# Patient Record
Sex: Male | Born: 1940
Health system: Southern US, Community
[De-identification: ages and names within clinical notes are randomized; demographics above are authoritative.]

## PROBLEM LIST (undated history)

## (undated) DIAGNOSIS — D649 Anemia, unspecified: Secondary | ICD-10-CM

## (undated) DIAGNOSIS — R911 Solitary pulmonary nodule: Secondary | ICD-10-CM

## (undated) DIAGNOSIS — Z720 Tobacco use: Secondary | ICD-10-CM

## (undated) DIAGNOSIS — N184 Chronic kidney disease, stage 4 (severe): Secondary | ICD-10-CM

## (undated) DIAGNOSIS — R1310 Dysphagia, unspecified: Secondary | ICD-10-CM

## (undated) DIAGNOSIS — J9610 Chronic respiratory failure, unspecified whether with hypoxia or hypercapnia: Secondary | ICD-10-CM

## (undated) DIAGNOSIS — I2510 Atherosclerotic heart disease of native coronary artery without angina pectoris: Secondary | ICD-10-CM

## (undated) DIAGNOSIS — F250 Schizoaffective disorder, bipolar type: Secondary | ICD-10-CM

## (undated) DIAGNOSIS — E119 Type 2 diabetes mellitus without complications: Secondary | ICD-10-CM

## (undated) DIAGNOSIS — I10 Essential (primary) hypertension: Secondary | ICD-10-CM

## (undated) DIAGNOSIS — K219 Gastro-esophageal reflux disease without esophagitis: Secondary | ICD-10-CM

## (undated) DIAGNOSIS — J8410 Pulmonary fibrosis, unspecified: Secondary | ICD-10-CM

## (undated) DIAGNOSIS — I5032 Chronic diastolic (congestive) heart failure: Secondary | ICD-10-CM

## (undated) DIAGNOSIS — F259 Schizoaffective disorder, unspecified: Secondary | ICD-10-CM

## (undated) HISTORY — PX: CORONARY ARTERY BYPASS GRAFT: SHX141

---

## 2018-05-15 ENCOUNTER — Observation Stay (HOSPITAL_COMMUNITY): Payer: Medicare Other

## 2018-05-15 ENCOUNTER — Other Ambulatory Visit: Payer: Self-pay

## 2018-05-15 ENCOUNTER — Emergency Department (HOSPITAL_COMMUNITY): Payer: Medicare Other

## 2018-05-15 ENCOUNTER — Encounter (HOSPITAL_COMMUNITY): Payer: Self-pay | Admitting: Emergency Medicine

## 2018-05-15 ENCOUNTER — Inpatient Hospital Stay (HOSPITAL_COMMUNITY)
Admission: EM | Admit: 2018-05-15 | Discharge: 2018-05-18 | DRG: 872 | Disposition: A | Discharge status: Home / Self Care | Payer: Medicare Other | Attending: Internal Medicine | Admitting: Internal Medicine

## 2018-05-15 DIAGNOSIS — N3000 Acute cystitis without hematuria: Secondary | ICD-10-CM

## 2018-05-15 DIAGNOSIS — A419 Sepsis, unspecified organism: Secondary | ICD-10-CM | POA: Diagnosis not present

## 2018-05-15 DIAGNOSIS — N320 Bladder-neck obstruction: Secondary | ICD-10-CM | POA: Diagnosis present

## 2018-05-15 DIAGNOSIS — A4151 Sepsis due to Escherichia coli [E. coli]: Principal | ICD-10-CM | POA: Diagnosis present

## 2018-05-15 DIAGNOSIS — R339 Retention of urine, unspecified: Secondary | ICD-10-CM | POA: Diagnosis present

## 2018-05-15 DIAGNOSIS — F250 Schizoaffective disorder, bipolar type: Secondary | ICD-10-CM

## 2018-05-15 DIAGNOSIS — Z951 Presence of aortocoronary bypass graft: Secondary | ICD-10-CM

## 2018-05-15 DIAGNOSIS — I10 Essential (primary) hypertension: Secondary | ICD-10-CM | POA: Diagnosis present

## 2018-05-15 DIAGNOSIS — F259 Schizoaffective disorder, unspecified: Secondary | ICD-10-CM | POA: Diagnosis present

## 2018-05-15 DIAGNOSIS — D649 Anemia, unspecified: Secondary | ICD-10-CM | POA: Diagnosis present

## 2018-05-15 DIAGNOSIS — I2510 Atherosclerotic heart disease of native coronary artery without angina pectoris: Secondary | ICD-10-CM | POA: Insufficient documentation

## 2018-05-15 DIAGNOSIS — E876 Hypokalemia: Secondary | ICD-10-CM | POA: Diagnosis present

## 2018-05-15 DIAGNOSIS — N179 Acute kidney failure, unspecified: Secondary | ICD-10-CM | POA: Diagnosis not present

## 2018-05-15 DIAGNOSIS — R338 Other retention of urine: Secondary | ICD-10-CM

## 2018-05-15 DIAGNOSIS — J8410 Pulmonary fibrosis, unspecified: Secondary | ICD-10-CM | POA: Diagnosis present

## 2018-05-15 DIAGNOSIS — K219 Gastro-esophageal reflux disease without esophagitis: Secondary | ICD-10-CM | POA: Diagnosis present

## 2018-05-15 DIAGNOSIS — E785 Hyperlipidemia, unspecified: Secondary | ICD-10-CM | POA: Diagnosis present

## 2018-05-15 DIAGNOSIS — N390 Urinary tract infection, site not specified: Secondary | ICD-10-CM | POA: Diagnosis present

## 2018-05-15 DIAGNOSIS — Z8249 Family history of ischemic heart disease and other diseases of the circulatory system: Secondary | ICD-10-CM

## 2018-05-15 DIAGNOSIS — Z23 Encounter for immunization: Secondary | ICD-10-CM

## 2018-05-15 DIAGNOSIS — Z7982 Long term (current) use of aspirin: Secondary | ICD-10-CM

## 2018-05-15 DIAGNOSIS — N281 Cyst of kidney, acquired: Secondary | ICD-10-CM | POA: Diagnosis present

## 2018-05-15 DIAGNOSIS — Z87891 Personal history of nicotine dependence: Secondary | ICD-10-CM

## 2018-05-15 DIAGNOSIS — Z833 Family history of diabetes mellitus: Secondary | ICD-10-CM

## 2018-05-15 HISTORY — DX: Schizoaffective disorder, bipolar type: F25.0

## 2018-05-15 HISTORY — DX: Anemia, unspecified: D64.9

## 2018-05-15 HISTORY — DX: Atherosclerotic heart disease of native coronary artery without angina pectoris: I25.10

## 2018-05-15 HISTORY — DX: Gastro-esophageal reflux disease without esophagitis: K21.9

## 2018-05-15 HISTORY — DX: Schizoaffective disorder, unspecified: F25.9

## 2018-05-15 HISTORY — DX: Essential (primary) hypertension: I10

## 2018-05-15 HISTORY — DX: Pulmonary fibrosis, unspecified: J84.10

## 2018-05-15 LAB — URINALYSIS, ROUTINE W REFLEX MICROSCOPIC
BILIRUBIN URINE: NEGATIVE
Glucose, UA: NEGATIVE mg/dL
Ketones, ur: NEGATIVE mg/dL
Nitrite: NEGATIVE
PROTEIN: 100 mg/dL — AB
Specific Gravity, Urine: 1.011 (ref 1.005–1.030)
pH: 5 (ref 5.0–8.0)

## 2018-05-15 LAB — CBC WITH DIFFERENTIAL/PLATELET
Abs Immature Granulocytes: 0.04 10*3/uL (ref 0.00–0.07)
BASOS PCT: 0 %
Basophils Absolute: 0 10*3/uL (ref 0.0–0.1)
EOS ABS: 0 10*3/uL (ref 0.0–0.5)
EOS PCT: 0 %
HCT: 34.3 % — ABNORMAL LOW (ref 39.0–52.0)
Hemoglobin: 10.3 g/dL — ABNORMAL LOW (ref 13.0–17.0)
Immature Granulocytes: 0 %
Lymphocytes Relative: 3 %
Lymphs Abs: 0.3 10*3/uL — ABNORMAL LOW (ref 0.7–4.0)
MCH: 27.5 pg (ref 26.0–34.0)
MCHC: 30 g/dL (ref 30.0–36.0)
MCV: 91.5 fL (ref 80.0–100.0)
MONO ABS: 0.1 10*3/uL (ref 0.1–1.0)
Monocytes Relative: 1 %
NEUTROS ABS: 9.7 10*3/uL — AB (ref 1.7–7.7)
Neutrophils Relative %: 96 %
PLATELETS: 174 10*3/uL (ref 150–400)
RBC: 3.75 MIL/uL — ABNORMAL LOW (ref 4.22–5.81)
RDW: 15 % (ref 11.5–15.5)
WBC: 10.1 10*3/uL (ref 4.0–10.5)
nRBC: 0 % (ref 0.0–0.2)

## 2018-05-15 LAB — I-STAT CG4 LACTIC ACID, ED
Lactic Acid, Venous: 3.35 mmol/L (ref 0.5–1.9)
Lactic Acid, Venous: 3.65 mmol/L (ref 0.5–1.9)

## 2018-05-15 LAB — COMPREHENSIVE METABOLIC PANEL
ALBUMIN: 3.6 g/dL (ref 3.5–5.0)
ALT: 19 U/L (ref 0–44)
AST: 27 U/L (ref 15–41)
Alkaline Phosphatase: 95 U/L (ref 38–126)
Anion gap: 10 (ref 5–15)
BUN: 37 mg/dL — AB (ref 8–23)
CHLORIDE: 110 mmol/L (ref 98–111)
CO2: 21 mmol/L — ABNORMAL LOW (ref 22–32)
Calcium: 7.5 mg/dL — ABNORMAL LOW (ref 8.9–10.3)
Creatinine, Ser: 2.63 mg/dL — ABNORMAL HIGH (ref 0.61–1.24)
GFR calc Af Amer: 26 mL/min — ABNORMAL LOW (ref >60)
GFR calc non Af Amer: 22 mL/min — ABNORMAL LOW (ref >60)
Glucose, Bld: 172 mg/dL — ABNORMAL HIGH (ref 70–99)
POTASSIUM: 3.3 mmol/L — AB (ref 3.5–5.1)
SODIUM: 141 mmol/L (ref 135–145)
Total Bilirubin: 0.9 mg/dL (ref 0.3–1.2)
Total Protein: 6.9 g/dL (ref 6.5–8.1)

## 2018-05-15 LAB — LACTIC ACID, PLASMA: Lactic Acid, Venous: 2 mmol/L (ref 0.5–1.9)

## 2018-05-15 LAB — CBG MONITORING, ED: GLUCOSE-CAPILLARY: 150 mg/dL — AB (ref 70–99)

## 2018-05-15 LAB — PROCALCITONIN: Procalcitonin: 70.91 ng/mL

## 2018-05-15 MED ORDER — ONDANSETRON HCL 4 MG/2ML IJ SOLN: 4.0000 mg | Freq: Four times a day (QID) | INTRAMUSCULAR | Status: DC | PRN

## 2018-05-15 MED ORDER — ENOXAPARIN SODIUM 30 MG/0.3ML ~~LOC~~ SOLN
30.0000 mg | SUBCUTANEOUS | Status: DC
  Administered 2018-05-16 – 2018-05-17 (×3): 30 mg via SUBCUTANEOUS
  Filled 2018-05-15 (×3): qty 0.3

## 2018-05-15 MED ORDER — ONDANSETRON HCL 4 MG/2ML IJ SOLN: 4.0000 mg | Freq: Once | INTRAMUSCULAR | Status: DC

## 2018-05-15 MED ORDER — POTASSIUM CHLORIDE 20 MEQ/15ML (10%) PO SOLN
40.0000 meq | Freq: Once | ORAL | Status: DC
  Filled 2018-05-15: qty 30

## 2018-05-15 MED ORDER — ACETAMINOPHEN 325 MG PO TABS: 650.0000 mg | ORAL_TABLET | Freq: Four times a day (QID) | ORAL | Status: DC | PRN

## 2018-05-15 MED ORDER — INFLUENZA VAC SPLIT HIGH-DOSE 0.5 ML IM SUSY
0.5000 mL | PREFILLED_SYRINGE | INTRAMUSCULAR | Status: AC
  Administered 2018-05-16: 0.5 mL via INTRAMUSCULAR
  Filled 2018-05-15: qty 0.5

## 2018-05-15 MED ORDER — SODIUM CHLORIDE 0.9 % IV BOLUS
500.0000 mL | Freq: Once | INTRAVENOUS | Status: AC
  Administered 2018-05-16: 500 mL via INTRAVENOUS

## 2018-05-15 MED ORDER — SODIUM CHLORIDE 0.9 % IV SOLN
2.0000 g | INTRAVENOUS | Status: DC
  Administered 2018-05-16: 2 g via INTRAVENOUS
  Filled 2018-05-15: qty 20

## 2018-05-15 MED ORDER — HYDRALAZINE HCL 20 MG/ML IJ SOLN: 5.0000 mg | INTRAMUSCULAR | Status: DC | PRN

## 2018-05-15 MED ORDER — SODIUM CHLORIDE 0.9 % IV BOLUS
1000.0000 mL | Freq: Once | INTRAVENOUS | Status: AC
  Administered 2018-05-15: 1000 mL via INTRAVENOUS

## 2018-05-15 MED ORDER — SODIUM CHLORIDE 0.9 % IV SOLN
1.0000 g | Freq: Once | INTRAVENOUS | Status: AC
  Administered 2018-05-15: 1 g via INTRAVENOUS
  Filled 2018-05-15: qty 10

## 2018-05-15 MED ORDER — SODIUM CHLORIDE 0.9 % IV BOLUS
500.0000 mL | Freq: Once | INTRAVENOUS | Status: AC
  Administered 2018-05-15: 500 mL via INTRAVENOUS

## 2018-05-15 MED ORDER — PANTOPRAZOLE SODIUM 40 MG PO TBEC
40.0000 mg | DELAYED_RELEASE_TABLET | Freq: Every day | ORAL | Status: DC
  Administered 2018-05-16 – 2018-05-18 (×3): 40 mg via ORAL
  Filled 2018-05-15 (×4): qty 1

## 2018-05-15 MED ORDER — VITAMIN B-12 1000 MCG PO TABS
1000.0000 ug | ORAL_TABLET | Freq: Every day | ORAL | Status: DC
  Administered 2018-05-16 – 2018-05-18 (×3): 1000 ug via ORAL
  Filled 2018-05-15 (×4): qty 1

## 2018-05-15 MED ORDER — ACETAMINOPHEN 650 MG RE SUPP: 650.0000 mg | Freq: Four times a day (QID) | RECTAL | Status: DC | PRN

## 2018-05-15 MED ORDER — CLONAZEPAM 0.5 MG PO TABS
0.5000 mg | ORAL_TABLET | Freq: Three times a day (TID) | ORAL | Status: DC | PRN
  Administered 2018-05-16: 0.5 mg via ORAL
  Filled 2018-05-15: qty 1

## 2018-05-15 MED ORDER — ALBUTEROL SULFATE (2.5 MG/3ML) 0.083% IN NEBU: 2.5000 mg | INHALATION_SOLUTION | RESPIRATORY_TRACT | Status: DC | PRN

## 2018-05-15 MED ORDER — SODIUM CHLORIDE 0.9 % IV SOLN
INTRAVENOUS | Status: DC
  Administered 2018-05-15 – 2018-05-17 (×4): via INTRAVENOUS

## 2018-05-15 MED ORDER — ATORVASTATIN CALCIUM 20 MG PO TABS
20.0000 mg | ORAL_TABLET | Freq: Every day | ORAL | Status: DC
  Administered 2018-05-16 – 2018-05-17 (×3): 20 mg via ORAL
  Filled 2018-05-15 (×3): qty 1

## 2018-05-15 MED ORDER — ONDANSETRON HCL 4 MG PO TABS: 4.0000 mg | ORAL_TABLET | Freq: Four times a day (QID) | ORAL | Status: DC | PRN

## 2018-05-15 MED ORDER — PNEUMOCOCCAL VAC POLYVALENT 25 MCG/0.5ML IJ INJ
0.5000 mL | INJECTION | INTRAMUSCULAR | Status: AC
  Administered 2018-05-16: 0.5 mL via INTRAMUSCULAR
  Filled 2018-05-15: qty 0.5

## 2018-05-15 MED ORDER — ZOLPIDEM TARTRATE 5 MG PO TABS
5.0000 mg | ORAL_TABLET | Freq: Every evening | ORAL | Status: DC | PRN
  Filled 2018-05-15: qty 1

## 2018-05-15 MED ORDER — ASPIRIN 325 MG PO TABS
325.0000 mg | ORAL_TABLET | Freq: Every day | ORAL | Status: DC
  Administered 2018-05-16 – 2018-05-18 (×3): 325 mg via ORAL
  Filled 2018-05-15 (×4): qty 1

## 2018-05-15 MED ORDER — BENZTROPINE MESYLATE 0.5 MG PO TABS
0.5000 mg | ORAL_TABLET | Freq: Two times a day (BID) | ORAL | Status: DC
  Administered 2018-05-16 – 2018-05-18 (×6): 0.5 mg via ORAL
  Filled 2018-05-15 (×7): qty 1

## 2018-05-15 NOTE — Progress Notes (Addendum)
CRITICAL VALUE ALERT  Critical Value:  LACTIC ACID:  2.0  Date & Time Notied:  05/15/2018 @ 2330  Provider Notified: Bodenheimer  Orders Received/Actions taken: 500 mL Bolus NS

## 2018-05-15 NOTE — ED Notes (Signed)
Robert Little Pt caregiver [phone]

## 2018-05-15 NOTE — H&P (Signed)
History and Physical    Robert Little QHU:[id number] DOB: Sep 08, 1940 DOA: 05/15/2018  Referring MD/NP/PA:   PCP: Patient, No Pcp Per   Patient coming from:  The patient is coming from group home.  At baseline, pt is dependent for most of ADL.        Chief Complaint: Dysuria, nausea, vomiting, hypotension  HPI: Robert Little is a 77 y.o. male with medical history significant of hyperlipidemia, hypertension, GERD, schizophrenia, CAD, CABG, anemia, pulmonary fibrosis, who presents with dysuria, nausea, vomiting, hypotension.  Per his caregiver, pt has has been having dysuria, burning on urination and increased urinary frequency for 2 or 3 weeks.  He was found to have foul odor urine.  Patient also has nausea and vomiting twice today.  He does not seem to have fever or chills.  Per EMS report, patient had hypotension with blood pressure 72/49 and was given IVF. His blood pressure is 119/62 in ED.  Patient denies abdominal pain, diarrhea, flank pain.  No chest pain, shortness breath, cough.  He moves all extremities normally.  Caregiver states that the patient had similar symptoms 4 months ago and was diagnosed with and treated for UTI  ED Course: pt was found to have positive urinalysis (turbid appearance, large amount of leukocyte, few bacteria, WBC>50), WBC 10.1, lactic acid 3.35, 3.65, potassium 3.3, creatinine 2.63, BUN 37 (no baseline creatinine available), temperature 99.3, tachycardia, tachypnea, oxygen saturation 96% on room air.  Chest x-ray showed bilateral cardiomegaly, and bilateral pulmonary fibrosis without infiltration.  Patient is placed on telemetry bed for observation.  Review of Systems:   General: no fevers, chills, no body weight gain, has poor appetite, has fatigue HEENT: no blurry vision, hearing changes or sore throat Respiratory: no dyspnea, coughing, wheezing CV: no chest pain, no palpitations GI: has nausea, vomiting, no  abdominal pain, diarrhea, constipation GU: has  dysuria, burning on urination, increased urinary frequency, no hematuria  Ext: no leg edema Neuro: no unilateral weakness, numbness, or tingling, no vision change or hearing loss Skin: no rash, no skin tear. MSK: No muscle spasm, no deformity, no limitation of range of movement in spin Heme: No easy bruising.  Travel history: No recent long distant travel.  Allergy: No Known Allergies  Past Medical History:  Diagnosis Date  . Anemia   . Coronary artery disease   . GERD (gastroesophageal reflux disease)   . Hypertension   . Pulmonary fibrosis ([place])   . Schizo affective schizophrenia [facility])     Past Surgical History:  Procedure Laterality Date  . CORONARY ARTERY BYPASS GRAFT      Social History:  reports that he has quit smoking. He does not have any smokeless tobacco history on file. He reports that he drank alcohol. He reports that he has current or past drug history.  Family History:  Family History  Problem Relation Age of Onset  . Diabetes Mellitus II Mother   . Diabetes Mellitus II Maternal Grandmother   . Heart disease Maternal Grandmother      Prior to Admission medications   Medication Sig Start Date End Date Taking? Authorizing Provider  acetaminophen (TYLENOL) 325 MG tablet Take 325 mg by mouth daily.   Yes [provider]  aspirin 325 MG tablet Take 325 mg by mouth daily.   Yes [provider]  atenolol (TENORMIN) 25 MG tablet Take 25 mg by mouth daily.   Yes [provider]  atorvastatin (LIPITOR) 20 MG tablet Take 20 mg by mouth at bedtime.  Yes [provider]  benztropine (COGENTIN) 0.5 MG tablet Take 0.5 mg by mouth 2 (two) times daily.   Yes [provider]  clonazePAM (KLONOPIN) 0.5 MG tablet Take 0.5 mg by mouth 2 (two) times daily as needed for anxiety.   Yes [provider]  [employer]S VITAMIN B12 1000 MCG tablet Take 1,000 mcg by mouth daily. 05/02/18  Yes [provider]  Dexlansoprazole  (DEXILANT) 30 MG capsule Take 30 mg by mouth daily.   Yes [provider]  INVEGA SUSTENNA 156 MG/ML SUSY injection Inject 156 mg into the muscle every 30 (thirty) days. 04/28/18  Yes [provider]  lisinopril (PRINIVIL,ZESTRIL) 10 MG tablet Take 10 mg by mouth daily.   Yes [provider]  Multiple Vitamins-Minerals ([employer] SPECTRAVITE PO) Take 1 tablet by mouth daily.   Yes [provider]    Physical Exam: Vitals:   05/15/18 1739 05/15/18 1745 05/15/18 1800 05/15/18 2046  BP: 121/62 131/64 119/62 110/61  Pulse: 98 96 93 96  Resp: 20 (!) 22 (!) 24 (!) 24  Temp: 99.3 F (37.4 C)     TempSrc: Rectal     SpO2: 99% 100% 99% 97%   General: Not in acute distress HEENT:       Eyes: PERRL, EOMI, no scleral icterus.       ENT: No discharge from the ears and nose, no pharynx injection, no tonsillar enlargement.        Neck: No JVD, no bruit, no mass felt. Heme: No neck lymph node enlargement. Cardiac: S1/S2, RRR, No murmurs, No gallops or rubs. Respiratory: No rales, wheezing, rhonchi or rubs. GI: Soft, nondistended, nontender, no rebound pain, no organomegaly, BS present. GU: No hematuria Ext: No pitting leg edema bilaterally. 2+DP/PT pulse bilaterally. Musculoskeletal: No joint deformities, No joint redness or warmth, no limitation of ROM in spin. Skin: No rashes.  Neuro: Alert, oriented X3, cranial nerves II-XII grossly intact, moves all extremities normally. Psych: Patient is not psychotic, no suicidal or hemocidal ideation.  Labs on Admission: I have personally reviewed following labs and imaging studies  CBC: Recent Labs  Lab 05/15/18 1527  WBC 10.1  NEUTROABS 9.7*  HGB 10.3*  HCT 34.3*  MCV 91.5  PLT 664   Basic Metabolic Panel: Recent Labs  Lab 05/15/18 1527  NA 141  K 3.3*  CL 110  CO2 21*  GLUCOSE 172*  BUN 37*  CREATININE 2.63*  CALCIUM 7.5*   GFR: CrCl cannot be calculated (Unknown ideal weight.). Liver Function  Tests: Recent Labs  Lab 05/15/18 1527  AST 27  ALT 19  ALKPHOS 95  BILITOT 0.9  PROT 6.9  ALBUMIN 3.6   No results for input(s): LIPASE, AMYLASE in the last 168 hours. No results for input(s): AMMONIA in the last 168 hours. Coagulation Profile: No results for input(s): INR, PROTIME in the last 168 hours. Cardiac Enzymes: No results for input(s): CKTOTAL, CKMB, CKMBINDEX, TROPONINI in the last 168 hours. BNP (last 3 results) No results for input(s): PROBNP in the last 8760 hours. HbA1C: No results for input(s): HGBA1C in the last 72 hours. CBG: Recent Labs  Lab 05/15/18 1527  GLUCAP 150*   Lipid Profile: No results for input(s): CHOL, HDL, LDLCALC, TRIG, CHOLHDL, LDLDIRECT in the last 72 hours. Thyroid Function Tests: No results for input(s): TSH, T4TOTAL, FREET4, T3FREE, THYROIDAB in the last 72 hours. Anemia Panel: No results for input(s): VITAMINB12, FOLATE, FERRITIN, TIBC, IRON, RETICCTPCT in the last 72 hours. Urine analysis:  Component Value Date/Time   COLORURINE YELLOW 05/15/2018 1527   APPEARANCEUR TURBID (A) 05/15/2018 1527   LABSPEC 1.011 05/15/2018 1527   PHURINE 5.0 05/15/2018 1527   GLUCOSEU NEGATIVE 05/15/2018 1527   HGBUR MODERATE (A) 05/15/2018 1527   BILIRUBINUR NEGATIVE 05/15/2018 1527   KETONESUR NEGATIVE 05/15/2018 1527   PROTEINUR 100 (A) 05/15/2018 1527   NITRITE NEGATIVE 05/15/2018 1527   LEUKOCYTESUR LARGE (A) 05/15/2018 1527   Sepsis Labs: @LABRCNTIP (procalcitonin:4,lacticidven:4) )No results found for this or any previous visit (from the past 240 hour(s)).   Radiological Exams on Admission: Dg Chest 2 View  Result Date: 05/15/2018 CLINICAL DATA:  Wheezing, tachycardia EXAM: CHEST - 2 VIEW COMPARISON:  None. FINDINGS: Post sternotomy changes. Bibasilar fibrosis. Right upper lobe pleural and parenchymal scarring. No focal consolidation or effusion. Borderline heart size. Aortic atherosclerosis. No pneumothorax. IMPRESSION: Bibasilar  fibrosis without acute focal airspace disease. Borderline cardiomegaly. Electronically Signed   By: Donavan Foil M.D.   On: 05/15/2018 16:12     EKG: Independently reviewed.  Sinus rhythm, QTC 464, low voltage, nonspecific T wave change.   Assessment/Plan Principal Problem:   UTI (urinary tract infection) Active Problems:   Hypertension   Pulmonary fibrosis (HCC)   Schizo affective schizophrenia ([place])   Anemia   Sepsis ([place])   Hypokalemia   AKI (acute kidney injury) ([place])   HLD (hyperlipidemia)   Sepsis due to UTI (urinary tract infection): Patient meets criteria for sepsis with hypotension, tachycardia and tachypnea.  Lactic acid elevated 3.35, 3.65.  Currently hemodynamically stable.  -  Place on med-surg bed for obs -  Ceftriaxone by IV - Follow up results of urine and blood cx and amend antibiotic regimen if needed per sensitivity results - prn Zofran for nausea - will get Procalcitonin and trend lactic acid levels per sepsis protocol. - IVF: 2.5L of NS bolus in ED, followed by 125 cc/h  Hypokalemia: K= 3.3 on admission. - Repleted - Check Mg level  HLD: lipotor  HTN: -hold all oral Bp med due to hypotension -IV hydralazine PRN  Schizophrenia: Patient is calm. Patient is on Invega  injection every 30 days, last dose was on 05/05/18 -PRN Klonopin -cogentine  GERD: -Protonix  AKI: Likely due to multifactorial etiology, including UTI, possible ATN 2/2 hypotension, dehydration and continuation of ACEI. - IVF as above - Follow up renal function by BMP - Hold lisinopril - US-renal  Pulmonary fibrosis: Stable.  No shortness of breath or cough -PRN albuterol nebulizers  Anemia: Hemoglobin stable 10.3 per Follow-up with CBC   DVT ppx: SQ Lovenox Code Status: Full code Family Communication Yes, patient's caregiver at bed side Disposition Plan:  Anticipate discharge back to previous group home Consults called:  none Admission status: Obs / tele      Date  of Service 05/15/2018    Ivor Costa [facility] Pager [phone]  If 7PM-7AM, please contact night-coverage [email or web address] Password [facility] 05/15/2018, 9:24 PM

## 2018-05-15 NOTE — ED Provider Notes (Addendum)
[facility] EMERGENCY DEPARTMENT Provider Note   CSN: [id number] Arrival date & time: 05/15/18  1507     History   Chief Complaint Chief Complaint  Patient presents with  . Dysuria  . Emesis  . Hypotension    HPI Robert Little is a 77 y.o. male w PMHx CAD s/p multiple bypass surgeries, HTN, pulmonary fibrosis, schizoaffective d/o, GARD, presenting to the ED from [facility] for possible UTI. Pt states he has has dysuria from multiple weeks, also endorses malodorous and "dingy" urine. He states today he began feeling ill with nausea and an episode of emesis. Denies flank pain, abdominal pain, chest pain, cough, congestion. He states his thoughts have been racing the past few days and he feels restless. Per EMS, pt with similar presentation 4 months ago and diagnosed with UTI. He was tachycardic and hypotensive on arrival with BP of 72/49. IVF administered with improvement.   The history is provided by the patient and the EMS personnel.    Past Medical History:  Diagnosis Date  . Anemia   . Coronary artery disease   . GERD (gastroesophageal reflux disease)   . Hypertension   . Pulmonary fibrosis ([place])   . Schizo affective schizophrenia ([place])     There are no active problems to display for this patient.   Past Surgical History:  Procedure Laterality Date  . CORONARY ARTERY BYPASS GRAFT          Home Medications    Prior to Admission medications   Not on File    Family History History reviewed. No pertinent family history.  Social History Social History   Tobacco Use  . Smoking status: Former Smoker  Substance Use Topics  . Alcohol use: Not Currently  . Drug use: Not Currently     Allergies   Patient has no known allergies.   Review of Systems Review of Systems  Constitutional: Positive for fatigue. Negative for fever.  HENT: Negative for congestion.   Respiratory: Negative for cough and shortness of breath.   Cardiovascular:  Negative for chest pain.  Gastrointestinal: Positive for nausea and vomiting. Negative for abdominal pain, constipation and diarrhea.  Genitourinary: Positive for dysuria. Negative for flank pain.       Malodorous urine  Neurological: Negative for headaches.  Psychiatric/Behavioral: Negative for confusion.       Racing thoughts  All other systems reviewed and are negative.    Physical Exam Updated Vital Signs BP 117/61 (BP Location: Right Arm)   Pulse (!) 115   Temp 97.8 F (36.6 C) (Oral)   Resp (!) 23   SpO2 98%   Physical Exam  Constitutional: He is oriented to person, place, and time. He appears well-developed and well-nourished. No distress.  Pale-appearing  HENT:  Head: Normocephalic and atraumatic.  Eyes: Pupils are equal, round, and reactive to light. Conjunctivae and EOM are normal.  Neck: Normal range of motion.  Cardiovascular: Regular rhythm and intact distal pulses.  tachycardic  Pulmonary/Chest: Effort normal. No respiratory distress. He has wheezes (b/l bases). He has no rales.  Abdominal: Soft. Bowel sounds are normal. He exhibits no distension and no mass. There is no tenderness. There is no rebound and no guarding.  Musculoskeletal: He exhibits no edema.  Neurological: He is alert and oriented to person, place, and time. No cranial nerve deficit or sensory deficit. He exhibits normal muscle tone. Coordination normal.  Equal strength BUE and BLE  Skin: Skin is warm.  Psychiatric: He has  a normal mood and affect. His behavior is normal.  Nursing note and vitals reviewed.    ED Treatments / Results  Labs (all labs ordered are listed, but only abnormal results are displayed) Labs Reviewed  COMPREHENSIVE METABOLIC PANEL - Abnormal; Notable for the following components:      Result Value   Potassium 3.3 (*)    CO2 21 (*)    Glucose, Bld 172 (*)    BUN 37 (*)    Creatinine, Ser 2.63 (*)    Calcium 7.5 (*)    GFR calc non Af Amer 22 (*)    GFR calc Af  Amer 26 (*)    All other components within normal limits  URINALYSIS, ROUTINE W REFLEX MICROSCOPIC - Abnormal; Notable for the following components:   APPearance TURBID (*)    Hgb urine dipstick MODERATE (*)    Protein, ur 100 (*)    Leukocytes, UA LARGE (*)    WBC, UA >50 (*)    Bacteria, UA FEW (*)    All other components within normal limits  CBC WITH DIFFERENTIAL/PLATELET - Abnormal; Notable for the following components:   RBC 3.75 (*)    Hemoglobin 10.3 (*)    HCT 34.3 (*)    Neutro Abs 9.7 (*)    Lymphs Abs 0.3 (*)    All other components within normal limits  LACTIC ACID, PLASMA - Abnormal; Notable for the following components:   Lactic Acid, Venous 2.0 (*)    All other components within normal limits  I-STAT CG4 LACTIC ACID, ED - Abnormal; Notable for the following components:   Lactic Acid, Venous 3.35 (*)    All other components within normal limits  CBG MONITORING, ED - Abnormal; Notable for the following components:   Glucose-Capillary 150 (*)    All other components within normal limits  I-STAT CG4 LACTIC ACID, ED - Abnormal; Notable for the following components:   Lactic Acid, Venous 3.65 (*)    All other components within normal limits  URINE CULTURE  CULTURE, BLOOD (ROUTINE X 2)  CULTURE, BLOOD (ROUTINE X 2)  PROCALCITONIN  MAGNESIUM  LACTIC ACID, PLASMA  BASIC METABOLIC PANEL  CBC    EKG EKG Interpretation  Date/Time:  Monday May 15 2018 15:27:43 EST Ventricular Rate:  110 PR Interval:    QRS Duration: 108 QT Interval:  343 QTC Calculation: 464 R Axis:   42 Text Interpretation:  Sinus tachycardia Minimal ST depression, lateral leads Minimal ST elevation, lateral leads No prio rECG for comparison.  No STEMI Confirmed by Antony Blackbird [phone]) on 05/15/2018 3:44:41 PM   Radiology Dg Chest 2 View  Result Date: 05/15/2018 CLINICAL DATA:  Wheezing, tachycardia EXAM: CHEST - 2 VIEW COMPARISON:  None. FINDINGS: Post sternotomy changes. Bibasilar  fibrosis. Right upper lobe pleural and parenchymal scarring. No focal consolidation or effusion. Borderline heart size. Aortic atherosclerosis. No pneumothorax. IMPRESSION: Bibasilar fibrosis without acute focal airspace disease. Borderline cardiomegaly. Electronically Signed   By: Donavan Foil M.D.   On: 05/15/2018 16:12   Procedures Procedures (including critical care time) CRITICAL CARE Performed by: [place] N Robinson   Total critical care time: 35 minutes  Critical care time was exclusive of separately billable procedures and treating other patients.  Critical care was necessary to treat or prevent imminent or life-threatening deterioration.  Critical care was time spent personally by me on the following activities: development of treatment plan with patient and/or surrogate as well as nursing, discussions with consultants, evaluation of patient's  response to treatment, examination of patient, obtaining history from patient or surrogate, ordering and performing treatments and interventions, ordering and review of laboratory studies, ordering and review of radiographic studies, pulse oximetry and re-evaluation of patient's condition.  Medications Ordered in ED Medications  sodium chloride 0.9 % bolus 1,000 mL (has no administration in time range)     Initial Impression / Assessment and Plan / ED Course  I have reviewed the triage vital signs and the nursing notes.  Pertinent labs & imaging results that were available during my care of the patient were reviewed by me and considered in my medical decision making (see chart for details).  Clinical Course as of May 17 111  [facility] May 15, 2018  9798 Dr. Blaine Hamper accepting admission.   [JR]    Clinical Course User Index [JR] Robinson, [place] N, PA-C    Pt presenting from [facility] with concern for UTI and sepsis. Symptoms of Dysuria with malodorous and cloudy urine. Episode of nausea and vomiting today. No abdominal complaints.  Tachycardic on arrival and hypotensive with EMS however normotensive here.  Patient is alert and oriented.  Abdomen is soft and nontender.  Some wheezing noted bilateral lower lobes with normal work of breathing and normal oxygenation.  No obvious neuro deficits.  Afebrile with oral temp, pending rectal temp.  Fluids and nausea medication ordered.  Labs.  Chest x-ray.  Chest x-ray is negative for acute infiltrate.  Hemoglobin is 10.3, however no baseline to compare.  White count 10.1.  UA with signs of infection, large leukocytes, greater than 50 white blood cells, few bacteria.  Urine culture is sent.  CMP with mild hypokalemia and AKI of 2.63.  Initial lactic is elevated at 3.35.  Rectal temp of 99.3 F. 1 g of IV ceftriaxone administered.  Repeat lactic is rising. Additional IV fluids administered.  Patient reevaluated reports he feels alright.  Tachycardia gradually improving.  Patient remains slightly tachypneic.  Agreeable to plan for admission.  Dr. Blaine Hamper with [facility] accepting admission. Final Clinical Impressions(s) / ED Diagnoses   Final diagnoses:  AKI (acute kidney injury) ([place])  Acute cystitis without hematuria    ED Discharge Orders    None       Robinson, [place] N, PA-C 05/16/18 0119    Robinson, [place] N, PA-C 05/16/18 0119    Tegeler, Gwenyth Allegra, MD 05/16/18 [phone]

## 2018-05-15 NOTE — ED Triage Notes (Signed)
Pt to ER with [facility] from [facility] for possible sepsis and UTI; pt with cloudy urine and foul odor for couple days; HR 120 and BP 70/44; pt with 1 episode of emesis

## 2018-05-16 ENCOUNTER — Encounter (HOSPITAL_COMMUNITY): Payer: Self-pay

## 2018-05-16 DIAGNOSIS — J8410 Pulmonary fibrosis, unspecified: Secondary | ICD-10-CM | POA: Diagnosis present

## 2018-05-16 DIAGNOSIS — N281 Cyst of kidney, acquired: Secondary | ICD-10-CM | POA: Diagnosis present

## 2018-05-16 DIAGNOSIS — A419 Sepsis, unspecified organism: Secondary | ICD-10-CM | POA: Diagnosis present

## 2018-05-16 DIAGNOSIS — D649 Anemia, unspecified: Secondary | ICD-10-CM | POA: Diagnosis present

## 2018-05-16 DIAGNOSIS — N320 Bladder-neck obstruction: Secondary | ICD-10-CM | POA: Diagnosis present

## 2018-05-16 DIAGNOSIS — Z8249 Family history of ischemic heart disease and other diseases of the circulatory system: Secondary | ICD-10-CM | POA: Diagnosis not present

## 2018-05-16 DIAGNOSIS — N3000 Acute cystitis without hematuria: Secondary | ICD-10-CM | POA: Diagnosis present

## 2018-05-16 DIAGNOSIS — F259 Schizoaffective disorder, unspecified: Secondary | ICD-10-CM | POA: Diagnosis present

## 2018-05-16 DIAGNOSIS — A4151 Sepsis due to Escherichia coli [E. coli]: Secondary | ICD-10-CM | POA: Diagnosis present

## 2018-05-16 DIAGNOSIS — I10 Essential (primary) hypertension: Secondary | ICD-10-CM | POA: Diagnosis present

## 2018-05-16 DIAGNOSIS — Z87891 Personal history of nicotine dependence: Secondary | ICD-10-CM | POA: Diagnosis not present

## 2018-05-16 DIAGNOSIS — R338 Other retention of urine: Secondary | ICD-10-CM | POA: Diagnosis not present

## 2018-05-16 DIAGNOSIS — Z7982 Long term (current) use of aspirin: Secondary | ICD-10-CM | POA: Diagnosis not present

## 2018-05-16 DIAGNOSIS — Z833 Family history of diabetes mellitus: Secondary | ICD-10-CM | POA: Diagnosis not present

## 2018-05-16 DIAGNOSIS — Z951 Presence of aortocoronary bypass graft: Secondary | ICD-10-CM | POA: Diagnosis not present

## 2018-05-16 DIAGNOSIS — I2510 Atherosclerotic heart disease of native coronary artery without angina pectoris: Secondary | ICD-10-CM | POA: Diagnosis present

## 2018-05-16 DIAGNOSIS — N179 Acute kidney failure, unspecified: Secondary | ICD-10-CM | POA: Diagnosis present

## 2018-05-16 DIAGNOSIS — Z23 Encounter for immunization: Secondary | ICD-10-CM | POA: Diagnosis present

## 2018-05-16 DIAGNOSIS — R339 Retention of urine, unspecified: Secondary | ICD-10-CM | POA: Diagnosis present

## 2018-05-16 DIAGNOSIS — K219 Gastro-esophageal reflux disease without esophagitis: Secondary | ICD-10-CM | POA: Diagnosis present

## 2018-05-16 DIAGNOSIS — E785 Hyperlipidemia, unspecified: Secondary | ICD-10-CM | POA: Diagnosis present

## 2018-05-16 DIAGNOSIS — E876 Hypokalemia: Secondary | ICD-10-CM | POA: Diagnosis present

## 2018-05-16 LAB — CBC
HCT: 27 % — ABNORMAL LOW (ref 39.0–52.0)
HEMOGLOBIN: 8.5 g/dL — AB (ref 13.0–17.0)
MCH: 28.1 pg (ref 26.0–34.0)
MCHC: 31.5 g/dL (ref 30.0–36.0)
MCV: 89.4 fL (ref 80.0–100.0)
NRBC: 0 % (ref 0.0–0.2)
Platelets: 139 10*3/uL — ABNORMAL LOW (ref 150–400)
RBC: 3.02 MIL/uL — ABNORMAL LOW (ref 4.22–5.81)
RDW: 15 % (ref 11.5–15.5)
WBC: 20.3 10*3/uL — ABNORMAL HIGH (ref 4.0–10.5)

## 2018-05-16 LAB — LACTIC ACID, PLASMA: Lactic Acid, Venous: 2 mmol/L (ref 0.5–1.9)

## 2018-05-16 LAB — MAGNESIUM: MAGNESIUM: 0.6 mg/dL — AB (ref 1.7–2.4)

## 2018-05-16 LAB — BASIC METABOLIC PANEL
Anion gap: 8 (ref 5–15)
BUN: 34 mg/dL — AB (ref 8–23)
CHLORIDE: 115 mmol/L — AB (ref 98–111)
CO2: 15 mmol/L — AB (ref 22–32)
CREATININE: 2.21 mg/dL — AB (ref 0.61–1.24)
Calcium: 6.5 mg/dL — ABNORMAL LOW (ref 8.9–10.3)
GFR calc Af Amer: 32 mL/min — ABNORMAL LOW (ref >60)
GFR calc non Af Amer: 27 mL/min — ABNORMAL LOW (ref >60)
Glucose, Bld: 143 mg/dL — ABNORMAL HIGH (ref 70–99)
POTASSIUM: 3.8 mmol/L (ref 3.5–5.1)
Sodium: 138 mmol/L (ref 135–145)

## 2018-05-16 MED ORDER — MAGNESIUM SULFATE 2 GM/50ML IV SOLN
2.0000 g | Freq: Once | INTRAVENOUS | Status: AC
  Administered 2018-05-16: 2 g via INTRAVENOUS
  Filled 2018-05-16: qty 50

## 2018-05-16 MED ORDER — TAMSULOSIN HCL 0.4 MG PO CAPS
0.4000 mg | ORAL_CAPSULE | Freq: Every day | ORAL | Status: DC
  Administered 2018-05-16 – 2018-05-17 (×2): 0.4 mg via ORAL
  Filled 2018-05-16 (×2): qty 1

## 2018-05-16 NOTE — Progress Notes (Signed)
CRITICAL VALUE ALERT  Critical Value:  LACTIC ACID:  2.0  Date & Time Notied:  05/16/18 @ 3559  Provider Notified:  Bodenheimer  Orders Received/Actions taken: Awaiting instructions.

## 2018-05-16 NOTE — Progress Notes (Signed)
Called to unit to perform I&O cath with coude catheter.  Completed without difficulty with pt's RN at bedside. 600cc obtained.  Patient tolerated well.  Eliezer [place]

## 2018-05-16 NOTE — Plan of Care (Signed)
Discussed plan of care for the evening with patient.  Emphasized using the call bell when assistance is needed.  Also discussed pain management.  Good teach back displayed.

## 2018-05-16 NOTE — Progress Notes (Signed)
CRITICAL VALUE ALERT  Critical Value:  MAGNESIUM 0.6  Date & Time Notied:  05/16/18 @ 3846  Provider Notified: Brodenheimer  Orders Received/Actions taken: Awaiting instructions.

## 2018-05-16 NOTE — Progress Notes (Signed)
Progress Note    Robert Little  QAS:[id number] DOB: 05-May-1941  DOA: 05/15/2018 PCP: Patient, No Pcp Per    Brief Narrative:     Medical records reviewed and are as summarized below:  Robert Little is an 77 y.o. male with medical history significant of hyperlipidemia, hypertension, GERD, schizophrenia, CAD, CABG, anemia, pulmonary fibrosis, who presents with dysuria, nausea, vomiting, hypotension.  Per his caregiver, pt has has been having dysuria, burning on urination and increased urinary frequency for 2 or 3 weeks.  He was found to have foul odor urine.  Patient also has nausea and vomiting twice today.  He does not seem to have fever or chills.  Per EMS report, patient had hypotension with blood pressure 72/49 and was given IVF. His blood pressure is 119/62 in ED.  Patient denies abdominal pain, diarrhea, flank pain.  No chest pain, shortness breath, cough.  He moves all extremities normally.  Caregiver states that the patient had similar symptoms 4 months ago and was diagnosed with and treated for UTI  Assessment/Plan:   Principal Problem:   UTI (urinary tract infection) Active Problems:   Hypertension   Pulmonary fibrosis ([place])   Schizo affective schizophrenia ([place])   Anemia   Sepsis ([place])   Hypokalemia   AKI (acute kidney injury) ([place])   HLD (hyperlipidemia)  Sepsis due to UTI (urinary tract infection): Patient meets criteria for sepsis with hypotension, tachycardia and tachypnea.  Lactic acid elevated - Follow up results of urine and blood cx and amend antibiotic regimen if needed per sensitivity results- no old cultures for comparison - IV abx with rocephin - not suprisingly pro calcitonin is elevated  ?BPH -add flomax -bladder scan q 8 today -monitor I/Os  Leukocytosis -trending up -monitor and adjust abx as needed  Hypokalemia/hypomagnesemia - Repleted -recheck in AM  HLD: lipotor  HTN: -hold all oral Bp med due to hypotension -IV hydralazine  PRN  Schizophrenia: Patient is calm. Patient is on Invega  injection every 30 days, last dose was on 05/05/18 -PRN Klonopin -cogentin -from group home (social work consult)  GERD: -Protonix  AKI:  -multifactorial etiology, including UTI, possible ATN 2/2 hypotension, dehydration and continuation of ACEI. - IVF with improving Cr - Follow up renal function by BMP daily - Hold lisinopril - US-renal did not show hydronephrosis but patient does have some renal cysts  Pulmonary fibrosis: Stable.  No shortness of breath or cough -PRN albuterol nebulizers  Anemia:  -dropped from 10 to 8 but could have some volume dilution -heme test stools -outpatient follow up as ? baseline  Will change patient to inpt as he is still requiring IVF and IV abx-- his WBC count is trending up so may need further imaging if no improvement in AM.  Care is spanning > 2 midnights.     Family Communication/Anticipated D/C date and plan/Code Status   DVT prophylaxis: Lovenox ordered. Code Status: Full Code.  Family Communication:  Disposition Plan: pending improvement/resolution of symptoms   Medical Consultants:    None.    Subjective:   Feels like his appetite is better but still having trouble urinating  Objective:    Vitals:   05/15/18 1800 05/15/18 2046 05/15/18 2158 05/16/18 0750  BP: 119/62 110/61 114/60 123/63  Pulse: 93 96 78 77  Resp: (!) 24 (!) 24 (!) 22 20  Temp:   98.5 F (36.9 C) 98.4 F (36.9 C)  TempSrc:   Oral Oral  SpO2: 99% 97% 99% 98%  Intake/Output Summary (Last 24 hours) at 05/16/2018 0952 Last data filed at 05/16/2018 0803 Gross per 24 hour  Intake 4200 ml  Output 650 ml  Net 3550 ml   There were no vitals filed for this visit.  Exam: Pleasant and cooperative rrr No increased work of breathing No rashes/lesions +BS, mildly tender in lower quadrants  Data Reviewed:   I have personally reviewed following labs and imaging  studies:  Labs: Labs show the following:   Basic Metabolic Panel: Recent Labs  Lab 05/15/18 1527 05/16/18 0227  NA 141 138  K 3.3* 3.8  CL 110 115*  CO2 21* 15*  GLUCOSE 172* 143*  BUN 37* 34*  CREATININE 2.63* 2.21*  CALCIUM 7.5* 6.5*  MG  --  0.6*   GFR CrCl cannot be calculated (Unknown ideal weight.). Liver Function Tests: Recent Labs  Lab 05/15/18 1527  AST 27  ALT 19  ALKPHOS 95  BILITOT 0.9  PROT 6.9  ALBUMIN 3.6   No results for input(s): LIPASE, AMYLASE in the last 168 hours. No results for input(s): AMMONIA in the last 168 hours. Coagulation profile No results for input(s): INR, PROTIME in the last 168 hours.  CBC: Recent Labs  Lab 05/15/18 1527 05/16/18 0227  WBC 10.1 20.3*  NEUTROABS 9.7*  --   HGB 10.3* 8.5*  HCT 34.3* 27.0*  MCV 91.5 89.4  PLT 174 139*   Cardiac Enzymes: No results for input(s): CKTOTAL, CKMB, CKMBINDEX, TROPONINI in the last 168 hours. BNP (last 3 results) No results for input(s): PROBNP in the last 8760 hours. CBG: Recent Labs  Lab 05/15/18 1527  GLUCAP 150*   D-Dimer: No results for input(s): DDIMER in the last 72 hours. Hgb A1c: No results for input(s): HGBA1C in the last 72 hours. Lipid Profile: No results for input(s): CHOL, HDL, LDLCALC, TRIG, CHOLHDL, LDLDIRECT in the last 72 hours. Thyroid function studies: No results for input(s): TSH, T4TOTAL, T3FREE, THYROIDAB in the last 72 hours.  Invalid input(s): FREET3 Anemia work up: No results for input(s): VITAMINB12, FOLATE, FERRITIN, TIBC, IRON, RETICCTPCT in the last 72 hours. Sepsis Labs: Recent Labs  Lab 05/15/18 1527 05/15/18 1558 05/15/18 1757 05/15/18 2223 05/16/18 0227  PROCALCITON  --   --   --  70.91  --   WBC 10.1  --   --   --  20.3*  LATICACIDVEN  --  3.35* 3.65* 2.0* 2.0*    Microbiology No results found for this or any previous visit (from the past 240 hour(s)).  Procedures and diagnostic studies:  Dg Chest 2 View  Result  Date: 05/15/2018 CLINICAL DATA:  Wheezing, tachycardia EXAM: CHEST - 2 VIEW COMPARISON:  None. FINDINGS: Post sternotomy changes. Bibasilar fibrosis. Right upper lobe pleural and parenchymal scarring. No focal consolidation or effusion. Borderline heart size. Aortic atherosclerosis. No pneumothorax. IMPRESSION: Bibasilar fibrosis without acute focal airspace disease. Borderline cardiomegaly. Electronically Signed   By: Donavan Foil M.D.   On: 05/15/2018 16:12   US Renal  Result Date: 05/16/2018 CLINICAL DATA:  Initial evaluation for acute renal injury. EXAM: RENAL / URINARY TRACT ULTRASOUND COMPLETE COMPARISON:  None. FINDINGS: Right Kidney: Renal measurements: 8.8 x 4.3 x 4.8 cm = volume: 92.5 mL . Echogenicity within normal limits. No hydronephrosis. 1.7 x 1.6 x 1.4 cm simple cyst present at the lower pole. Left Kidney: Renal measurements: 9.5 x 5.1 x 5.0 cm = volume: 126.7 mL. Echogenicity within normal limits. No hydronephrosis. 1.5 x 1.4 x 1.5 cm simple cyst present  at the upper pole. Additional 3.0 x 2.8 x 2.8 cm simple cyst present at the lower pole. Bladder: Appears normal for degree of bladder distention. Bilateral ureteral jets visualized. IMPRESSION: 1. No hydronephrosis. 2. Simple bilateral renal cysts as above. Electronically Signed   By: Jeannine Boga M.D.   On: 05/16/2018 00:58    Medications:   . aspirin  325 mg Oral Daily  . atorvastatin  20 mg Oral QHS  . benztropine  0.5 mg Oral BID  . enoxaparin (LOVENOX) injection  30 mg Subcutaneous Q24H  . Influenza vac split quadrivalent PF  0.5 mL Intramuscular Tomorrow-1000  . ondansetron (ZOFRAN) IV  4 mg Intravenous Once  . pantoprazole  40 mg Oral Daily  . pneumococcal 23 valent vaccine  0.5 mL Intramuscular Tomorrow-1000  . potassium chloride  40 mEq Oral Once  . tamsulosin  0.4 mg Oral QPC supper  . cyanocobalamin  1,000 mcg Oral Daily   Continuous Infusions: . sodium chloride 75 mL/hr at 05/16/18 0826  . cefTRIAXone  (ROCEPHIN)  IV       LOS: 0 days   Geradine Girt  [facility]   *Please refer to [email or web address], password [email or web address] to get updated schedule on who will round on this patient, as hospitalists switch teams weekly. If 7PM-7AM, please contact night-coverage at [email or web address], password [email or web address] for any overnight needs.  05/16/2018, 9:52 AM

## 2018-05-16 NOTE — Progress Notes (Signed)
1100 bladder scan revealed +897mL in bladder. Coude catheter ordered for in and out catheterization per MD. Patient then voided 473mL in urinal; post void bladder scan revealed +557mL still in bladder. In and out catheterization to commence.  Kinnie Scales, RN 05/16/18 2:38 PM

## 2018-05-17 DIAGNOSIS — E785 Hyperlipidemia, unspecified: Secondary | ICD-10-CM

## 2018-05-17 DIAGNOSIS — N179 Acute kidney failure, unspecified: Secondary | ICD-10-CM

## 2018-05-17 DIAGNOSIS — F250 Schizoaffective disorder, bipolar type: Secondary | ICD-10-CM

## 2018-05-17 DIAGNOSIS — J8410 Pulmonary fibrosis, unspecified: Secondary | ICD-10-CM

## 2018-05-17 DIAGNOSIS — A419 Sepsis, unspecified organism: Secondary | ICD-10-CM

## 2018-05-17 DIAGNOSIS — N3000 Acute cystitis without hematuria: Secondary | ICD-10-CM

## 2018-05-17 DIAGNOSIS — E876 Hypokalemia: Secondary | ICD-10-CM

## 2018-05-17 DIAGNOSIS — I10 Essential (primary) hypertension: Secondary | ICD-10-CM

## 2018-05-17 LAB — CBC WITH DIFFERENTIAL/PLATELET
Abs Immature Granulocytes: 0.15 10*3/uL — ABNORMAL HIGH (ref 0.00–0.07)
Basophils Absolute: 0 10*3/uL (ref 0.0–0.1)
Basophils Relative: 0 %
EOS ABS: 0.1 10*3/uL (ref 0.0–0.5)
EOS PCT: 1 %
HEMATOCRIT: 26.1 % — AB (ref 39.0–52.0)
Hemoglobin: 8.3 g/dL — ABNORMAL LOW (ref 13.0–17.0)
Immature Granulocytes: 1 %
Lymphocytes Relative: 11 %
Lymphs Abs: 1.5 10*3/uL (ref 0.7–4.0)
MCH: 28.2 pg (ref 26.0–34.0)
MCHC: 31.8 g/dL (ref 30.0–36.0)
MCV: 88.8 fL (ref 80.0–100.0)
MONO ABS: 1 10*3/uL (ref 0.1–1.0)
MONOS PCT: 7 %
Neutro Abs: 10.7 10*3/uL — ABNORMAL HIGH (ref 1.7–7.7)
Neutrophils Relative %: 80 %
Platelets: 136 10*3/uL — ABNORMAL LOW (ref 150–400)
RBC: 2.94 MIL/uL — ABNORMAL LOW (ref 4.22–5.81)
RDW: 15.5 % (ref 11.5–15.5)
WBC: 13.5 10*3/uL — ABNORMAL HIGH (ref 4.0–10.5)
nRBC: 0 % (ref 0.0–0.2)

## 2018-05-17 LAB — URINE CULTURE: Culture: >=100000 — AB

## 2018-05-17 LAB — BASIC METABOLIC PANEL
Anion gap: 7 (ref 5–15)
BUN: 35 mg/dL — ABNORMAL HIGH (ref 8–23)
CALCIUM: 7 mg/dL — AB (ref 8.9–10.3)
CO2: 17 mmol/L — ABNORMAL LOW (ref 22–32)
CREATININE: 2.4 mg/dL — AB (ref 0.61–1.24)
Chloride: 117 mmol/L — ABNORMAL HIGH (ref 98–111)
GFR calc Af Amer: 29 mL/min — ABNORMAL LOW (ref >60)
GFR, EST NON AFRICAN AMERICAN: 25 mL/min — AB (ref >60)
Glucose, Bld: 115 mg/dL — ABNORMAL HIGH (ref 70–99)
POTASSIUM: 3.8 mmol/L (ref 3.5–5.1)
SODIUM: 141 mmol/L (ref 135–145)

## 2018-05-17 LAB — MAGNESIUM: MAGNESIUM: 1.2 mg/dL — AB (ref 1.7–2.4)

## 2018-05-17 LAB — IRON AND TIBC
IRON: 20 ug/dL — AB (ref 45–182)
Saturation Ratios: 10 % — ABNORMAL LOW (ref 17.9–39.5)
TIBC: 206 ug/dL — AB (ref 250–450)
UIBC: 186 ug/dL

## 2018-05-17 LAB — PROCALCITONIN: Procalcitonin: 62.74 ng/mL

## 2018-05-17 LAB — RETICULOCYTES
IMMATURE RETIC FRACT: 15.1 % (ref 2.3–15.9)
RBC.: 3.44 MIL/uL — ABNORMAL LOW (ref 4.22–5.81)
RETIC COUNT ABSOLUTE: 47.8 10*3/uL (ref 19.0–186.0)
Retic Ct Pct: 1.4 % (ref 0.4–3.1)

## 2018-05-17 LAB — FERRITIN: Ferritin: 317 ng/mL (ref 24–336)

## 2018-05-17 MED ORDER — POTASSIUM CHLORIDE CRYS ER 20 MEQ PO TBCR
40.0000 meq | EXTENDED_RELEASE_TABLET | Freq: Once | ORAL | Status: AC
  Administered 2018-05-17: 40 meq via ORAL
  Filled 2018-05-17: qty 2

## 2018-05-17 MED ORDER — MAGNESIUM SULFATE 4 GM/100ML IV SOLN
4.0000 g | Freq: Once | INTRAVENOUS | Status: AC
  Administered 2018-05-17: 4 g via INTRAVENOUS
  Filled 2018-05-17: qty 100

## 2018-05-17 MED ORDER — GUAIFENESIN ER 600 MG PO TB12
600.0000 mg | ORAL_TABLET | Freq: Two times a day (BID) | ORAL | Status: DC
  Administered 2018-05-17 – 2018-05-18 (×3): 600 mg via ORAL
  Filled 2018-05-17 (×3): qty 1

## 2018-05-17 MED ORDER — CEPHALEXIN 500 MG PO CAPS
500.0000 mg | ORAL_CAPSULE | Freq: Two times a day (BID) | ORAL | Status: DC
  Administered 2018-05-17 – 2018-05-18 (×3): 500 mg via ORAL
  Filled 2018-05-17 (×3): qty 1

## 2018-05-17 MED ORDER — ATENOLOL 25 MG PO TABS
25.0000 mg | ORAL_TABLET | Freq: Every day | ORAL | Status: DC
  Administered 2018-05-17 – 2018-05-18 (×2): 25 mg via ORAL
  Filled 2018-05-17 (×2): qty 1

## 2018-05-17 NOTE — Progress Notes (Signed)
PROGRESS NOTE    Robert Little   ZWC:[id number]  DOB: 08/24/1940  DOA: 05/15/2018 PCP: Patient, No Pcp Per   Brief Narrative:  Robert Little is an 77 y.o. male with medical history significant ofhyperlipidemia, hypertension, GERD, schizophrenia, CAD, CABG, anemia, pulmonary fibrosis, who presents with dysuria, nausea, vomiting, hypotension.  Per his caregiver, pt hashas been having dysuria,burning on urination and increased urinary frequency for 2 or 3 weeks.He was found to have foul odor urine.Patient also has nausea andvomiting twice today. He does not seem to have fever or chills. Per EMS report, patient had hypotension with blood pressure 72/49 and was given IVF.His blood pressure is 119/62 in ED. Patient denies abdominal pain, diarrhea, flank pain. No chest pain, shortness breath, cough. He moves all extremities normally. Caregiver states that the patient had similar symptoms 4 months ago and was diagnosed with and treated for UTI   Subjective: No complaints other than chronic chest congestion for which he takes Mucinex ROS: no complaints of nausea, vomiting, constipation diarrhea, cough, dyspnea or dysuria. No other complaints.   Assessment & Plan:   Principal Problem:   UTI (urinary tract infection)- Sepsis - in relation to urinary retention - antibiotics narrowed based on sensitivities to Cephalexin for E coli - blood cultures negative  Active Problems: Urinary retention - patient is able to void but still retaining quite a lot of urine -  I and O caths done - foley placed today - Flomax started - will need outpt f/u with Urology  AKI - foley should placed to resolve BOO- follow Cr - ACE on hold - US- shows simple renal cysts- not etiology for AKI - d/c IVF- eating and drinking well     Hypotension with h/o Hypertension - BP improve- can resume Atenolol today- hold Lisinopril    Pulmonary fibrosis  - noted at bases on CXR    Schizo affective  schizophrenia  - cont Cogentin, on Invega monthly    Anemia - drop from 10.3 to 8.3 is likely dilutional - check anemia penal    Hypokalemia - replaced   DVT prophylaxis: Lovenox Code Status: Full code Family Communication:  Disposition Plan: possibly home tomorrow if Cr improving Consultants:   none Procedures:   none Antimicrobials:  Anti-infectives (From admission, onward)   Start     Dose/Rate Route Frequency Ordered Stop   05/17/18 1000  cephALEXin (KEFLEX) capsule 500 mg     500 mg Oral Every 12 hours 05/17/18 0904     05/16/18 1800  cefTRIAXone (ROCEPHIN) 2 g in sodium chloride 0.9 % 100 mL IVPB  Status:  Discontinued     2 g 200 mL/hr over 30 Minutes Intravenous Every 24 hours 05/15/18 1954 05/17/18 0904   05/15/18 1815  cefTRIAXone (ROCEPHIN) 1 g in sodium chloride 0.9 % 100 mL IVPB     1 g 200 mL/hr over 30 Minutes Intravenous  Once 05/15/18 1809 05/15/18 1926       Objective: Vitals:   05/16/18 1531 05/16/18 2233 05/17/18 0807 05/17/18 1100  BP: (!) 122/56 (!) 125/52 (!) 140/55   Pulse: 67 65 69   Resp: 20 20 (!) 28   Temp: (!) 97.1 F (36.2 C) 98 F (36.7 C) 97.7 F (36.5 C)   TempSrc: Oral Oral Oral   SpO2: 99% 98% 95%   Weight:    71.8 kg  Height:    5\' 8"  (1.727 m)    Intake/Output Summary (Last 24 hours) at 05/17/2018 1355 Last data filed  at 05/17/2018 1200 Gross per 24 hour  Intake 2193.33 ml  Output 2807 ml  Net -613.67 ml   Filed Weights   05/17/18 1100  Weight: 71.8 kg    Examination: General exam: Appears comfortable  HEENT: PERRLA, oral mucosa moist, no sclera icterus or thrush Respiratory system: Clear to auscultation. Respiratory effort normal. Cardiovascular system: S1 & S2 heard, RRR.   Gastrointestinal system: Abdomen soft, non-tender, nondistended. Normal bowel sounds. Central nervous system: Alert and oriented. No focal neurological deficits. Extremities: No cyanosis, clubbing or edema Skin: No rashes or  ulcers Psychiatry:  Mood & affect appropriate.     Data Reviewed: I have personally reviewed following labs and imaging studies  CBC: Recent Labs  Lab 05/15/18 1527 05/16/18 0227 05/17/18 0247  WBC 10.1 20.3* 13.5*  NEUTROABS 9.7*  --  10.7*  HGB 10.3* 8.5* 8.3*  HCT 34.3* 27.0* 26.1*  MCV 91.5 89.4 88.8  PLT 174 139* 035*   Basic Metabolic Panel: Recent Labs  Lab 05/15/18 1527 05/16/18 0227 05/17/18 0247  NA 141 138 141  K 3.3* 3.8 3.8  CL 110 115* 117*  CO2 21* 15* 17*  GLUCOSE 172* 143* 115*  BUN 37* 34* 35*  CREATININE 2.63* 2.21* 2.40*  CALCIUM 7.5* 6.5* 7.0*  MG  --  0.6* 1.2*   GFR: Estimated Creatinine Clearance: 25.3 mL/min (A) (by C-G formula based on SCr of 2.4 mg/dL (H)). Liver Function Tests: Recent Labs  Lab 05/15/18 1527  AST 27  ALT 19  ALKPHOS 95  BILITOT 0.9  PROT 6.9  ALBUMIN 3.6   No results for input(s): LIPASE, AMYLASE in the last 168 hours. No results for input(s): AMMONIA in the last 168 hours. Coagulation Profile: No results for input(s): INR, PROTIME in the last 168 hours. Cardiac Enzymes: No results for input(s): CKTOTAL, CKMB, CKMBINDEX, TROPONINI in the last 168 hours. BNP (last 3 results) No results for input(s): PROBNP in the last 8760 hours. HbA1C: No results for input(s): HGBA1C in the last 72 hours. CBG: Recent Labs  Lab 05/15/18 1527  GLUCAP 150*   Lipid Profile: No results for input(s): CHOL, HDL, LDLCALC, TRIG, CHOLHDL, LDLDIRECT in the last 72 hours. Thyroid Function Tests: No results for input(s): TSH, T4TOTAL, FREET4, T3FREE, THYROIDAB in the last 72 hours. Anemia Panel: No results for input(s): VITAMINB12, FOLATE, FERRITIN, TIBC, IRON, RETICCTPCT in the last 72 hours. Urine analysis:    Component Value Date/Time   COLORURINE YELLOW 05/15/2018 1527   APPEARANCEUR TURBID (A) 05/15/2018 1527   LABSPEC 1.011 05/15/2018 1527   PHURINE 5.0 05/15/2018 1527   GLUCOSEU NEGATIVE 05/15/2018 1527   HGBUR  MODERATE (A) 05/15/2018 1527   BILIRUBINUR NEGATIVE 05/15/2018 1527   KETONESUR NEGATIVE 05/15/2018 1527   PROTEINUR 100 (A) 05/15/2018 1527   NITRITE NEGATIVE 05/15/2018 1527   LEUKOCYTESUR LARGE (A) 05/15/2018 1527   Sepsis Labs: @LABRCNTIP (procalcitonin:4,lacticidven:4) ) Recent Results (from the past 240 hour(s))  Culture, blood (routine x 2)     Status: None (Preliminary result)   Collection Time: 05/15/18  3:28 PM  Result Value Ref Range Status   Specimen Description BLOOD LEFT ANTECUBITAL  Final   Special Requests   Final    BOTTLES DRAWN AEROBIC AND ANAEROBIC Blood Culture adequate volume   Culture   Final    NO GROWTH 2 DAYS Performed at [facility], [place] [address] [place], [place] [address]    Report Status PENDING  Incomplete  Culture, blood (routine x 2)  Status: None (Preliminary result)   Collection Time: 05/15/18  3:50 PM  Result Value Ref Range Status   Specimen Description BLOOD RIGHT FOREARM  Final   Special Requests   Final    BOTTLES DRAWN AEROBIC AND ANAEROBIC Blood Culture adequate volume   Culture   Final    NO GROWTH 2 DAYS Performed at [facility], [address], [place], [place] [address]    Report Status PENDING  Incomplete  Urine culture     Status: Abnormal   Collection Time: 05/15/18  5:03 PM  Result Value Ref Range Status   Specimen Description URINE, CLEAN CATCH  Final   Special Requests   Final    NONE Performed at [facility], [place] [address] [place], [place] [address]    Culture >=100,000 COLONIES/mL ESCHERICHIA COLI (A)  Final   Report Status 05/17/2018 FINAL  Final   Organism ID, Bacteria ESCHERICHIA COLI (A)  Final      Susceptibility   Escherichia coli - MIC*    AMPICILLIN >=32 RESISTANT Resistant     CEFAZOLIN <=4 SENSITIVE Sensitive     CEFTRIAXONE <=1 SENSITIVE Sensitive     CIPROFLOXACIN <=0.25 SENSITIVE Sensitive     GENTAMICIN <=1 SENSITIVE Sensitive     IMIPENEM <=0.25 SENSITIVE Sensitive      NITROFURANTOIN <=16 SENSITIVE Sensitive     TRIMETH/SULFA <=20 SENSITIVE Sensitive     AMPICILLIN/SULBACTAM 4 SENSITIVE Sensitive     PIP/TAZO <=4 SENSITIVE Sensitive     Extended ESBL NEGATIVE Sensitive     * >=100,000 COLONIES/mL ESCHERICHIA COLI         Radiology Studies: Dg Chest 2 View  Result Date: 05/15/2018 CLINICAL DATA:  Wheezing, tachycardia EXAM: CHEST - 2 VIEW COMPARISON:  None. FINDINGS: Post sternotomy changes. Bibasilar fibrosis. Right upper lobe pleural and parenchymal scarring. No focal consolidation or effusion. Borderline heart size. Aortic atherosclerosis. No pneumothorax. IMPRESSION: Bibasilar fibrosis without acute focal airspace disease. Borderline cardiomegaly. Electronically Signed   By: Donavan Foil M.D.   On: 05/15/2018 16:12   US Renal  Result Date: 05/16/2018 CLINICAL DATA:  Initial evaluation for acute renal injury. EXAM: RENAL / URINARY TRACT ULTRASOUND COMPLETE COMPARISON:  None. FINDINGS: Right Kidney: Renal measurements: 8.8 x 4.3 x 4.8 cm = volume: 92.5 mL . Echogenicity within normal limits. No hydronephrosis. 1.7 x 1.6 x 1.4 cm simple cyst present at the lower pole. Left Kidney: Renal measurements: 9.5 x 5.1 x 5.0 cm = volume: 126.7 mL. Echogenicity within normal limits. No hydronephrosis. 1.5 x 1.4 x 1.5 cm simple cyst present at the upper pole. Additional 3.0 x 2.8 x 2.8 cm simple cyst present at the lower pole. Bladder: Appears normal for degree of bladder distention. Bilateral ureteral jets visualized. IMPRESSION: 1. No hydronephrosis. 2. Simple bilateral renal cysts as above. Electronically Signed   By: Jeannine Boga M.D.   On: 05/16/2018 00:58      Scheduled Meds: . aspirin  325 mg Oral Daily  . atorvastatin  20 mg Oral QHS  . benztropine  0.5 mg Oral BID  . cephALEXin  500 mg Oral Q12H  . enoxaparin (LOVENOX) injection  30 mg Subcutaneous Q24H  . guaiFENesin  600 mg Oral BID  . ondansetron (ZOFRAN) IV  4 mg Intravenous Once  .  pantoprazole  40 mg Oral Daily  . potassium chloride  40 mEq Oral Once  . tamsulosin  0.4 mg Oral QPC supper  . cyanocobalamin  1,000 mcg Oral  Daily   Continuous Infusions:   LOS: 1 day    Time spent in minutes: [place], MD [facility] Pager: [email or web address] Password [email or web address] 05/17/2018, 1:55 PM

## 2018-05-17 NOTE — Progress Notes (Signed)
Bladder scan performed on patient.    Patient voided 300 mL and bladder scan showed 996 mL of residual urine in bladder.  Coude in and out catheter ordered for urine retention.  Patient voided 175 mL before in and out cath.  Residual urine in bladder was 800 mL.

## 2018-05-18 DIAGNOSIS — R338 Other retention of urine: Secondary | ICD-10-CM

## 2018-05-18 LAB — BASIC METABOLIC PANEL
ANION GAP: 6 (ref 5–15)
BUN: 34 mg/dL — AB (ref 8–23)
CHLORIDE: 113 mmol/L — AB (ref 98–111)
CO2: 19 mmol/L — ABNORMAL LOW (ref 22–32)
Calcium: 7.6 mg/dL — ABNORMAL LOW (ref 8.9–10.3)
Creatinine, Ser: 2.21 mg/dL — ABNORMAL HIGH (ref 0.61–1.24)
GFR calc Af Amer: 32 mL/min — ABNORMAL LOW (ref >60)
GFR calc non Af Amer: 27 mL/min — ABNORMAL LOW (ref >60)
Glucose, Bld: 154 mg/dL — ABNORMAL HIGH (ref 70–99)
POTASSIUM: 4.5 mmol/L (ref 3.5–5.1)
SODIUM: 138 mmol/L (ref 135–145)

## 2018-05-18 LAB — FOLATE: FOLATE: 15 ng/mL (ref >5.9)

## 2018-05-18 LAB — VITAMIN B12: Vitamin B-12: 770 pg/mL (ref 180–914)

## 2018-05-18 MED ORDER — ACETAMINOPHEN 325 MG PO TABS: 650.0000 mg | ORAL_TABLET | Freq: Four times a day (QID) | ORAL | Status: DC | PRN

## 2018-05-18 MED ORDER — GUAIFENESIN ER 600 MG PO TB12: 600.0000 mg | ORAL_TABLET | Freq: Two times a day (BID) | ORAL | 0 refills | Status: DC | PRN

## 2018-05-18 MED ORDER — TAMSULOSIN HCL 0.4 MG PO CAPS: 0.4000 mg | ORAL_CAPSULE | Freq: Every day | ORAL | 0 refills | Status: AC

## 2018-05-18 MED ORDER — CEPHALEXIN 500 MG PO CAPS: 500.0000 mg | ORAL_CAPSULE | Freq: Two times a day (BID) | ORAL | 0 refills | Status: DC

## 2018-05-18 NOTE — Progress Notes (Signed)
CSW alerted by RN that patient group home unable to provide transport. CSW contacted [facility] for patient pick up.  No further CSW needs at this time.  Laveda Abbe, [place] Clinical Social Worker [phone]

## 2018-05-18 NOTE — Clinical Social Work Note (Addendum)
CSW spoke with [name] pt's caregiver at [facility]. He is aware and agreeable to pt returning today. When pt's d/c order is in RN please contact Legrand Como [phone] to let him know and he will come pick the pt up. Clinical Social Worker will sign off for now as social work intervention is no longer needed. Please consult [place] again if new need arises.   Shelton Silvas A Angely Dietz 05/18/2018

## 2018-05-18 NOTE — Discharge Summary (Signed)
Physician Discharge Summary  Jennifer Holland YQM:[id number] DOB: Nov 20, 1940 DOA: 05/15/2018  PCP: Patient, No Pcp Per  Admit date: 05/15/2018 Discharge date: 05/18/2018  Admitted From: group home Disposition:  same   Recommendations for Outpatient Follow-up:  1. Cont Cephalexin for 4 more days 2. Will d/c with Foley and will need to see Urology next week for voiding trial and urodynamic studies 3. Needs Bmet at next visit to f/u on Cr 4. Resume Losartan after renal function improves 5.  would recommend rechecking Ferritin in 2-3 weeks when acute infection has resolved    Discharge Condition:  stable CODE STATUS:  Full code   Diet recommendation:  Heart healthy   Discharge Diagnoses:  Principal Problem:   UTI (urinary tract infection) Active Problems:   Sepsis ([place])   Hypokalemia   AKI (acute kidney injury) ([place])   Acute urinary retention   Hypertension   Pulmonary fibrosis (HCC)   Schizo affective schizophrenia ([place])   Anemia   HLD (hyperlipidemia)      Brief Summary: Robert Little is an 77 y.o.malewith medical history significant ofhyperlipidemia, hypertension, GERD, schizophrenia, CAD, CABG, anemia, pulmonary fibrosis, who presents with dysuria, nausea, vomiting, hypotension.  Per his caregiver, pt hashas been having dysuria,burning on urination and increased urinary frequency for 2 or 3 weeks.He was found to have foul odor urine.Patient also has nausea andvomiting twice today. He does not seem to have fever or chills. Per EMS report, patient had hypotension with blood pressure 72/49 and was given IVF.His blood pressure is 119/62 in ED. Patient denies abdominal pain, diarrhea, flank pain. No chest pain, shortness breath, cough. He moves all extremities normally. Caregiver states that the patient had similar symptoms 4 months ago and was diagnosed with and treated for UTI  Hospital Course:  Principal Problem:   UTI (urinary tract infection)- Sepsis - in  relation to urinary retention - antibiotics narrowed based on sensitivities to Cephalexin for E coli - blood cultures negative - leukocytosis improving  Active Problems: Urinary retention - patient is able to void but still retaining quite a lot of urine -  I and O caths done - Flomax started - foley placed 11/13- the patient states he no longer feels full or tender in lower abdomen - will need outpt f/u with Urology  AKI - foley  placed to resolve BOO-   Cr improved today- f/u at next visit (next week) to ensure it returns to baseline - ACE on hold - US- shows simple renal cysts- not etiology for AKI - d/c'd IVF- eating and drinking well    Hypokalemia - replaced     Hypotension with h/o Hypertension -due to sepsis - BP improved- resumed Atenolol  11/13- hold Lisinopril    Pulmonary fibrosis  - noted at bases on CXR- pulse ox in 90s on room air    Schizo affective schizophrenia  - cont Cogentin, on Invega monthly - appears quite stable- able to appropriately comprehend plan and asks appropriate questions    Anemia - drop from 10.3 to 8.3 is likely dilutional - check anemia penal shows normal stores- would recommend rechecking Ferritin in 2-3 weeks when acute infection has resolved    Discharge Exam: Vitals:   05/18/18 0744 05/18/18 0900  BP: (!) 144/64 135/61  Pulse: 79   Resp: 12   Temp: 99.5 F (37.5 C)   SpO2: 93%    Vitals:   05/17/18 1653 05/17/18 2244 05/18/18 0744 05/18/18 0900  BP: (!) 150/73 (!) 127/59 (!) 144/64 135/61  Pulse: 87 80 79   Resp: 16 15 12    Temp: 97.7 F (36.5 C) 98.1 F (36.7 C) 99.5 F (37.5 C)   TempSrc: Oral Oral Oral   SpO2: 99% 94% 93%   Weight:      Height:        General: Pt is alert, awake, not in acute distress Cardiovascular: RRR, S1/S2 +, no rubs, no gallops Respiratory: CTA bilaterally, no wheezing, no rhonchi Abdominal: Soft, NT, ND, bowel sounds + Extremities: no edema, no cyanosis   Discharge  Instructions  Discharge Instructions    Diet - low sodium heart healthy   Complete by:  As directed    Increase activity slowly   Complete by:  As directed      Allergies as of 05/18/2018   No Known Allergies     Medication List    STOP taking these medications   lisinopril 10 MG tablet Commonly known as:  PRINIVIL,ZESTRIL     TAKE these medications   acetaminophen 325 MG tablet Commonly known as:  TYLENOL Take 2 tablets (650 mg total) by mouth every 6 (six) hours as needed for mild pain (or Fever >/= 101). What changed:    how much to take  when to take this  reasons to take this   aspirin 325 MG tablet Take 325 mg by mouth daily.   atenolol 25 MG tablet Commonly known as:  TENORMIN Take 25 mg by mouth daily.   atorvastatin 20 MG tablet Commonly known as:  LIPITOR Take 20 mg by mouth at bedtime.   benztropine 0.5 MG tablet Commonly known as:  COGENTIN Take 0.5 mg by mouth 2 (two) times daily.   cephALEXin 500 MG capsule Commonly known as:  KEFLEX Take 1 capsule (500 mg total) by mouth every 12 (twelve) hours.   clonazePAM 0.5 MG tablet Commonly known as:  KLONOPIN Take 0.5 mg by mouth 2 (two) times daily as needed for anxiety.   [employer] SPECTRAVITE PO Take 1 tablet by mouth daily.   [employer] VITAMIN B12 1000 MCG tablet Generic drug:  cyanocobalamin Take 1,000 mcg by mouth daily.   DEXILANT 30 MG capsule Generic drug:  Dexlansoprazole Take 30 mg by mouth daily.   guaiFENesin 600 MG 12 hr tablet Commonly known as:  MUCINEX Take 1 tablet (600 mg total) by mouth 2 (two) times daily as needed.   INVEGA SUSTENNA 156 MG/ML Susy injection Generic drug:  paliperidone Inject 156 mg into the muscle every 30 (thirty) days.   tamsulosin 0.4 MG Caps capsule Commonly known as:  FLOMAX Take 1 capsule (0.4 mg total) by mouth daily after supper.      Follow-up Information    [name]   Specialty:  Family Medicine Why:  please  call to make follow up apt a week from now. Appointment: May 25, 2018 @ 3pm Contact information: [address] [place] [address] [phone]          No Known Allergies   Procedures/Studies:    Dg Chest 2 View  Result Date: 05/15/2018 CLINICAL DATA:  Wheezing, tachycardia EXAM: CHEST - 2 VIEW COMPARISON:  None. FINDINGS: Post sternotomy changes. Bibasilar fibrosis. Right upper lobe pleural and parenchymal scarring. No focal consolidation or effusion. Borderline heart size. Aortic atherosclerosis. No pneumothorax. IMPRESSION: Bibasilar fibrosis without acute focal airspace disease. Borderline cardiomegaly. Electronically Signed   By: Donavan Foil M.D.   On: 05/15/2018 16:12  US Renal  Result Date: 05/16/2018 CLINICAL DATA:  Initial evaluation for acute renal injury. EXAM: RENAL / URINARY TRACT ULTRASOUND COMPLETE COMPARISON:  None. FINDINGS: Right Kidney: Renal measurements: 8.8 x 4.3 x 4.8 cm = volume: 92.5 mL . Echogenicity within normal limits. No hydronephrosis. 1.7 x 1.6 x 1.4 cm simple cyst present at the lower pole. Left Kidney: Renal measurements: 9.5 x 5.1 x 5.0 cm = volume: 126.7 mL. Echogenicity within normal limits. No hydronephrosis. 1.5 x 1.4 x 1.5 cm simple cyst present at the upper pole. Additional 3.0 x 2.8 x 2.8 cm simple cyst present at the lower pole. Bladder: Appears normal for degree of bladder distention. Bilateral ureteral jets visualized. IMPRESSION: 1. No hydronephrosis. 2. Simple bilateral renal cysts as above. Electronically Signed   By: Jeannine Boga M.D.   On: 05/16/2018 00:58     The results of significant diagnostics from this hospitalization (including imaging, microbiology, ancillary and laboratory) are listed below for reference.     Microbiology: Recent Results (from the past 240 hour(s))  Culture, blood (routine x 2)     Status: None (Preliminary result)   Collection Time: 05/15/18  3:28 PM  Result Value  Ref Range Status   Specimen Description BLOOD LEFT ANTECUBITAL  Final   Special Requests   Final    BOTTLES DRAWN AEROBIC AND ANAEROBIC Blood Culture adequate volume   Culture   Final    NO GROWTH 3 DAYS Performed at [facility], [address] [place], [place] [address]    Report Status PENDING  Incomplete  Culture, blood (routine x 2)     Status: None (Preliminary result)   Collection Time: 05/15/18  3:50 PM  Result Value Ref Range Status   Specimen Description BLOOD RIGHT FOREARM  Final   Special Requests   Final    BOTTLES DRAWN AEROBIC AND ANAEROBIC Blood Culture adequate volume   Culture   Final    NO GROWTH 3 DAYS Performed at [facility], [place] [address], [place], [place] [address]    Report Status PENDING  Incomplete  Urine culture     Status: Abnormal   Collection Time: 05/15/18  5:03 PM  Result Value Ref Range Status   Specimen Description URINE, CLEAN CATCH  Final   Special Requests   Final    NONE Performed at [facility], [place] [address], [place], [place] [address]    Culture >=100,000 COLONIES/mL ESCHERICHIA COLI (A)  Final   Report Status 05/17/2018 FINAL  Final   Organism ID, Bacteria ESCHERICHIA COLI (A)  Final      Susceptibility   Escherichia coli - MIC*    AMPICILLIN >=32 RESISTANT Resistant     CEFAZOLIN <=4 SENSITIVE Sensitive     CEFTRIAXONE <=1 SENSITIVE Sensitive     CIPROFLOXACIN <=0.25 SENSITIVE Sensitive     GENTAMICIN <=1 SENSITIVE Sensitive     IMIPENEM <=0.25 SENSITIVE Sensitive     NITROFURANTOIN <=16 SENSITIVE Sensitive     TRIMETH/SULFA <=20 SENSITIVE Sensitive     AMPICILLIN/SULBACTAM 4 SENSITIVE Sensitive     PIP/TAZO <=4 SENSITIVE Sensitive     Extended ESBL NEGATIVE Sensitive     * >=100,000 COLONIES/mL ESCHERICHIA COLI     Labs: BNP (last 3 results) No results for input(s): BNP in the last 8760 hours. Basic Metabolic Panel: Recent Labs  Lab 05/15/18 1527 05/16/18 0227 05/17/18 0247 05/18/18 0321  NA  141 138 141 138  K 3.3* 3.8 3.8 4.5  CL 110 115* 117* 113*  CO2 21* 15* 17* 19*  GLUCOSE 172* 143* 115* 154*  BUN 37* 34* 35* 34*  CREATININE 2.63* 2.21* 2.40* 2.21*  CALCIUM 7.5* 6.5* 7.0* 7.6*  MG  --  0.6* 1.2*  --    Liver Function Tests: Recent Labs  Lab 05/15/18 1527  AST 27  ALT 19  ALKPHOS 95  BILITOT 0.9  PROT 6.9  ALBUMIN 3.6   No results for input(s): LIPASE, AMYLASE in the last 168 hours. No results for input(s): AMMONIA in the last 168 hours. CBC: Recent Labs  Lab 05/15/18 1527 05/16/18 0227 05/17/18 0247  WBC 10.1 20.3* 13.5*  NEUTROABS 9.7*  --  10.7*  HGB 10.3* 8.5* 8.3*  HCT 34.3* 27.0* 26.1*  MCV 91.5 89.4 88.8  PLT 174 139* 136*   Cardiac Enzymes: No results for input(s): CKTOTAL, CKMB, CKMBINDEX, TROPONINI in the last 168 hours. BNP: Invalid input(s): POCBNP CBG: Recent Labs  Lab 05/15/18 1527  GLUCAP 150*   D-Dimer No results for input(s): DDIMER in the last 72 hours. Hgb A1c No results for input(s): HGBA1C in the last 72 hours. Lipid Profile No results for input(s): CHOL, HDL, LDLCALC, TRIG, CHOLHDL, LDLDIRECT in the last 72 hours. Thyroid function studies No results for input(s): TSH, T4TOTAL, T3FREE, THYROIDAB in the last 72 hours.  Invalid input(s): FREET3 Anemia work up Recent Labs    05/17/18 1514 05/18/18 0321  VITAMINB12  --  770  FOLATE  --  15.0  FERRITIN 317  --   TIBC 206*  --   IRON 20*  --   RETICCTPCT 1.4  --    Urinalysis    Component Value Date/Time   COLORURINE YELLOW 05/15/2018 1527   APPEARANCEUR TURBID (A) 05/15/2018 1527   LABSPEC 1.011 05/15/2018 1527   PHURINE 5.0 05/15/2018 1527   GLUCOSEU NEGATIVE 05/15/2018 1527   HGBUR MODERATE (A) 05/15/2018 1527   BILIRUBINUR NEGATIVE 05/15/2018 1527   KETONESUR NEGATIVE 05/15/2018 1527   PROTEINUR 100 (A) 05/15/2018 1527   NITRITE NEGATIVE 05/15/2018 1527   LEUKOCYTESUR LARGE (A) 05/15/2018 1527   Sepsis Labs Invalid input(s): PROCALCITONIN,  WBC,   LACTICIDVEN Microbiology Recent Results (from the past 240 hour(s))  Culture, blood (routine x 2)     Status: None (Preliminary result)   Collection Time: 05/15/18  3:28 PM  Result Value Ref Range Status   Specimen Description BLOOD LEFT ANTECUBITAL  Final   Special Requests   Final    BOTTLES DRAWN AEROBIC AND ANAEROBIC Blood Culture adequate volume   Culture   Final    NO GROWTH 3 DAYS Performed at [facility], [place] [address] [place], [place] [address]    Report Status PENDING  Incomplete  Culture, blood (routine x 2)     Status: None (Preliminary result)   Collection Time: 05/15/18  3:50 PM  Result Value Ref Range Status   Specimen Description BLOOD RIGHT FOREARM  Final   Special Requests   Final    BOTTLES DRAWN AEROBIC AND ANAEROBIC Blood Culture adequate volume   Culture   Final    NO GROWTH 3 DAYS Performed at [facility], [place] [address], [place],  [address]    Report Status PENDING  Incomplete  Urine culture     Status: Abnormal   Collection Time: 05/15/18  5:03 PM  Result Value Ref Range Status   Specimen Description URINE, CLEAN CATCH  Final   Special Requests   Final  NONE Performed at [facility], [place] [address], [place], [place] [address]    Culture >=100,000 COLONIES/mL ESCHERICHIA COLI (A)  Final   Report Status 05/17/2018 FINAL  Final   Organism ID, Bacteria ESCHERICHIA COLI (A)  Final      Susceptibility   Escherichia coli - MIC*    AMPICILLIN >=32 RESISTANT Resistant     CEFAZOLIN <=4 SENSITIVE Sensitive     CEFTRIAXONE <=1 SENSITIVE Sensitive     CIPROFLOXACIN <=0.25 SENSITIVE Sensitive     GENTAMICIN <=1 SENSITIVE Sensitive     IMIPENEM <=0.25 SENSITIVE Sensitive     NITROFURANTOIN <=16 SENSITIVE Sensitive     TRIMETH/SULFA <=20 SENSITIVE Sensitive     AMPICILLIN/SULBACTAM 4 SENSITIVE Sensitive     PIP/TAZO <=4 SENSITIVE Sensitive     Extended ESBL NEGATIVE Sensitive     * >=100,000 COLONIES/mL ESCHERICHIA COLI      Time coordinating discharge in minutes: 60  SIGNED:   Debbe Odea, MD  [facility] 05/18/2018, 12:56 PM Pager   If 7PM-7AM, please contact night-coverage [email or web address] Password [email or web address]

## 2018-05-18 NOTE — NC FL2 (Signed)
[place] LEVEL OF CARE SCREENING TOOL     IDENTIFICATION  Patient Name: Robert Little Birthdate: 1941-03-07 Sex: male Admission Date (Current Location): 05/15/2018  [facility] and [place] Number:  Herbalist and Address:  [facility]. [facility] [place] [address], [place], [place] [address]      Provider Number: [id number]  Attending Physician Name and Address:  Debbe Odea, MD  Relative Name and Phone Number:       Current Level of Care: Hospital Recommended Level of Care: Other (Comment)(Group Home) Prior Approval Number:    Date Approved/Denied:   PASRR Number:    Discharge Plan: Other (Comment)(Group HOme)    Current Diagnoses: Patient Active Problem List   Diagnosis Date Noted  . UTI (urinary tract infection) 05/15/2018  . Sepsis ([place]) 05/15/2018  . Hypokalemia 05/15/2018  . AKI (acute kidney injury) ([place]) 05/15/2018  . HLD (hyperlipidemia) 05/15/2018  . Coronary artery disease   . Hypertension   . Pulmonary fibrosis ([place])   . Schizo affective schizophrenia ([place])   . Anemia     Orientation RESPIRATION BLADDER Height & Weight     Self, Time, Situation, Place  Normal Continent Weight: 158 lb 3.2 oz (71.8 kg) Height:  5\' 8"  (172.7 cm)  BEHAVIORAL SYMPTOMS/MOOD NEUROLOGICAL BOWEL NUTRITION STATUS      Continent Diet(Heart healthy, thin liquids)  AMBULATORY STATUS COMMUNICATION OF NEEDS Skin     Verbally Normal                       Personal Care Assistance Level of Assistance              Functional Limitations Info  Sight, Hearing, Speech Sight Info: Adequate Hearing Info: Adequate Speech Info: Adequate    SPECIAL CARE FACTORS FREQUENCY                       Contractures Contractures Info: Not present    Additional Factors Info  Code Status, Allergies Code Status Info: Full Code Allergies Info: NO known allergies           Current Medications (05/18/2018):  This is the current hospital  active medication list Current Facility-Administered Medications  Medication Dose Route Frequency Provider Last Rate Last Dose  . acetaminophen (TYLENOL) tablet 650 mg  650 mg Oral Q6H PRN Ivor Costa, MD       Or  . acetaminophen (TYLENOL) suppository 650 mg  650 mg Rectal Q6H PRN Ivor Costa, MD      . albuterol (PROVENTIL) (2.5 MG/3ML) 0.083% nebulizer solution 2.5 mg  2.5 mg Nebulization Q4H PRN Ivor Costa, MD      . aspirin tablet 325 mg  325 mg Oral Daily Ivor Costa, MD   325 mg at 05/17/18 3267  . atenolol (TENORMIN) tablet 25 mg  25 mg Oral Daily Debbe Odea, MD   25 mg at 05/17/18 1627  . atorvastatin (LIPITOR) tablet 20 mg  20 mg Oral QHS Ivor Costa, MD   20 mg at 05/17/18 2242  . benztropine (COGENTIN) tablet 0.5 mg  0.5 mg Oral BID Ivor Costa, MD   0.5 mg at 05/17/18 2242  . cephALEXin (KEFLEX) capsule 500 mg  500 mg Oral Q12H Debbe Odea, MD   500 mg at 05/18/18 0940  . clonazePAM (KLONOPIN) tablet 0.5 mg  0.5 mg Oral TID PRN Ivor Costa, MD   0.5 mg at 05/16/18 2057  . enoxaparin (LOVENOX) injection  30 mg  30 mg Subcutaneous Q24H Ivor Costa, MD   30 mg at 05/17/18 2001  . guaiFENesin (MUCINEX) 12 hr tablet 600 mg  600 mg Oral BID Debbe Odea, MD   600 mg at 05/18/18 0940  . hydrALAZINE (APRESOLINE) injection 5 mg  5 mg Intravenous Q2H PRN Ivor Costa, MD      . ondansetron [facility]) injection 4 mg  4 mg Intravenous Once Ivor Costa, MD   Stopped at 05/15/18 1739  . ondansetron (ZOFRAN) tablet 4 mg  4 mg Oral Q6H PRN Ivor Costa, MD       Or  . ondansetron [facility]) injection 4 mg  4 mg Intravenous Q6H PRN Ivor Costa, MD      . pantoprazole (PROTONIX) EC tablet 40 mg  40 mg Oral Daily Ivor Costa, MD   40 mg at 05/17/18 2182  . potassium chloride 20 MEQ/15ML (10%) solution 40 mEq  40 mEq Oral Once Ivor Costa, MD      . tamsulosin [facility]) capsule 0.4 mg  0.4 mg Oral QPC supper Eulogio Bear U, DO   0.4 mg at 05/17/18 1627  . vitamin B-12 (CYANOCOBALAMIN) tablet 1,000 mcg  1,000 mcg  Oral Daily Ivor Costa, MD   1,000 mcg at 05/17/18 8833  . zolpidem (AMBIEN) tablet 5 mg  5 mg Oral QHS PRN Ivor Costa, MD         Discharge Medications: Please see discharge summary for a list of discharge medications.  Relevant Imaging Results:  Relevant Lab Results:   Additional Information SSN: [id number]  Eileen Stanford, LCSW

## 2018-05-20 LAB — CULTURE, BLOOD (ROUTINE X 2)
CULTURE: NO GROWTH
CULTURE: NO GROWTH
SPECIAL REQUESTS: ADEQUATE
Special Requests: ADEQUATE

## 2018-05-22 ENCOUNTER — Encounter (HOSPITAL_COMMUNITY): Payer: Self-pay

## 2018-05-22 ENCOUNTER — Emergency Department (HOSPITAL_COMMUNITY)
Admission: EM | Admit: 2018-05-22 | Discharge: 2018-05-22 | Disposition: A | Discharge status: Home / Self Care | Payer: Medicare Other | Attending: Emergency Medicine | Admitting: Emergency Medicine

## 2018-05-22 ENCOUNTER — Other Ambulatory Visit: Payer: Self-pay

## 2018-05-22 DIAGNOSIS — R338 Other retention of urine: Secondary | ICD-10-CM | POA: Diagnosis not present

## 2018-05-22 DIAGNOSIS — Z960 Presence of urogenital implants: Secondary | ICD-10-CM | POA: Diagnosis not present

## 2018-05-22 DIAGNOSIS — Z79899 Other long term (current) drug therapy: Secondary | ICD-10-CM | POA: Diagnosis not present

## 2018-05-22 DIAGNOSIS — Z87891 Personal history of nicotine dependence: Secondary | ICD-10-CM | POA: Insufficient documentation

## 2018-05-22 DIAGNOSIS — Z951 Presence of aortocoronary bypass graft: Secondary | ICD-10-CM | POA: Insufficient documentation

## 2018-05-22 DIAGNOSIS — Z7982 Long term (current) use of aspirin: Secondary | ICD-10-CM | POA: Insufficient documentation

## 2018-05-22 DIAGNOSIS — I10 Essential (primary) hypertension: Secondary | ICD-10-CM | POA: Diagnosis not present

## 2018-05-22 DIAGNOSIS — I2510 Atherosclerotic heart disease of native coronary artery without angina pectoris: Secondary | ICD-10-CM | POA: Diagnosis not present

## 2018-05-22 DIAGNOSIS — R1030 Lower abdominal pain, unspecified: Secondary | ICD-10-CM | POA: Diagnosis present

## 2018-05-22 LAB — CBC WITH DIFFERENTIAL/PLATELET
Abs Immature Granulocytes: 0.21 10*3/uL — ABNORMAL HIGH (ref 0.00–0.07)
BASOS ABS: 0 10*3/uL (ref 0.0–0.1)
Basophils Relative: 0 %
Eosinophils Absolute: 0 10*3/uL (ref 0.0–0.5)
Eosinophils Relative: 0 %
HEMATOCRIT: 30.6 % — AB (ref 39.0–52.0)
Hemoglobin: 9.4 g/dL — ABNORMAL LOW (ref 13.0–17.0)
IMMATURE GRANULOCYTES: 2 %
LYMPHS ABS: 0.8 10*3/uL (ref 0.7–4.0)
Lymphocytes Relative: 7 %
MCH: 27.6 pg (ref 26.0–34.0)
MCHC: 30.7 g/dL (ref 30.0–36.0)
MCV: 90 fL (ref 80.0–100.0)
Monocytes Absolute: 0.5 10*3/uL (ref 0.1–1.0)
Monocytes Relative: 5 %
NEUTROS PCT: 86 %
NRBC: 0 % (ref 0.0–0.2)
Neutro Abs: 9.9 10*3/uL — ABNORMAL HIGH (ref 1.7–7.7)
PLATELETS: 223 10*3/uL (ref 150–400)
RBC: 3.4 MIL/uL — AB (ref 4.22–5.81)
RDW: 15.2 % (ref 11.5–15.5)
WBC: 11.5 10*3/uL — AB (ref 4.0–10.5)

## 2018-05-22 LAB — BASIC METABOLIC PANEL
Anion gap: 10 (ref 5–15)
BUN: 31 mg/dL — ABNORMAL HIGH (ref 8–23)
CALCIUM: 8.9 mg/dL (ref 8.9–10.3)
CO2: 22 mmol/L (ref 22–32)
CREATININE: 1.77 mg/dL — AB (ref 0.61–1.24)
Chloride: 108 mmol/L (ref 98–111)
GFR calc non Af Amer: 36 mL/min — ABNORMAL LOW (ref >60)
GFR, EST AFRICAN AMERICAN: 41 mL/min — AB (ref >60)
Glucose, Bld: 170 mg/dL — ABNORMAL HIGH (ref 70–99)
Potassium: 4.8 mmol/L (ref 3.5–5.1)
SODIUM: 140 mmol/L (ref 135–145)

## 2018-05-22 LAB — URINALYSIS, ROUTINE W REFLEX MICROSCOPIC
Bilirubin Urine: NEGATIVE
GLUCOSE, UA: NEGATIVE mg/dL
KETONES UR: NEGATIVE mg/dL
NITRITE: NEGATIVE
PH: 5 (ref 5.0–8.0)
Protein, ur: 100 mg/dL — AB
SPECIFIC GRAVITY, URINE: 1.01 (ref 1.005–1.030)

## 2018-05-22 NOTE — ED Notes (Signed)
Bladder scan reveled 335ml urine

## 2018-05-22 NOTE — ED Notes (Signed)
Spoke with Legrand Como (pts caregiver), update given with pt permission. Pt to go back to group home via [facility].

## 2018-05-22 NOTE — ED Notes (Signed)
[facility] notified need for transport  

## 2018-05-22 NOTE — ED Notes (Signed)
Bladder can revealed 230ml of urine.

## 2018-05-22 NOTE — ED Notes (Signed)
Bed: [facility]A Expected date:  Expected time:  Means of arrival:  Comments: 

## 2018-05-22 NOTE — ED Notes (Signed)
Pt still shows 327ml urine on post void residual. Pt voided another 465ml of urine into urinal. Per Regenia Skeeter MD: Repeat bladder scan. If large amount of urine remains, place cath.

## 2018-05-22 NOTE — ED Triage Notes (Signed)
Pt arrives via EMS from a private care facility. Pt had cath placed on 11-11 for urinary retention. Per EMS: Pt pulled on cath last night and is now unable to void. Pt complains of abd pain.

## 2018-05-22 NOTE — ED Notes (Signed)
Bed: RX54 Expected date:  Expected time:  Means of arrival:  Comments: EMS- 77yo M, abdominal pain/catheter issue

## 2018-05-22 NOTE — ED Notes (Signed)
After removal of cath. Pt voided in urinal. Pt had output of 224ml.Regenia Skeeter made aware.

## 2018-05-22 NOTE — ED Provider Notes (Signed)
[facility] DEPT Provider Note   CSN: [id number] Arrival date & time: 05/22/18  1303     History   Chief Complaint Chief Complaint  Patient presents with  . Urinary Retention  . Abdominal Pain    HPI Robert Little is a 77 y.o. male.  HPI  77 year old male presents with difficulty urinating.  The patient had a Foley catheter placed about a week ago for urinary retention.  At around 1 AM he had abdominal pain and could not urinate.  He tried to pull the Foley catheter out so he could urinate but this did not work.  He was found to have wet pants and it was presumed that he might have urinated some around the catheter.  Patient denies any fever, vomiting, or flank/back pain.  Finished antibiotics during his last admission.  Past Medical History:  Diagnosis Date  . Anemia   . Coronary artery disease   . GERD (gastroesophageal reflux disease)   . Hypertension   . Pulmonary fibrosis ([place])   . Schizo affective schizophrenia [facility])     Patient Active Problem List   Diagnosis Date Noted  . Acute urinary retention 05/18/2018  . UTI (urinary tract infection) 05/15/2018  . Sepsis ([place]) 05/15/2018  . Hypokalemia 05/15/2018  . AKI (acute kidney injury) ([place]) 05/15/2018  . HLD (hyperlipidemia) 05/15/2018  . Coronary artery disease   . Hypertension   . Pulmonary fibrosis ([place])   . Schizo affective schizophrenia ([place])   . Anemia     Past Surgical History:  Procedure Laterality Date  . CORONARY ARTERY BYPASS GRAFT          Home Medications    Prior to Admission medications   Medication Sig Start Date End Date Taking? Authorizing Provider  acetaminophen (TYLENOL) 325 MG tablet Take 2 tablets (650 mg total) by mouth every 6 (six) hours as needed for mild pain (or Fever >/= 101). 05/18/18   Debbe Odea, MD  aspirin 325 MG tablet Take 325 mg by mouth daily.    [provider]  atenolol (TENORMIN) 25 MG tablet Take 25 mg by mouth daily.     [provider]  atorvastatin (LIPITOR) 20 MG tablet Take 20 mg by mouth at bedtime.    [provider]  benztropine (COGENTIN) 0.5 MG tablet Take 0.5 mg by mouth 2 (two) times daily.    [provider]  cephALEXin (KEFLEX) 500 MG capsule Take 1 capsule (500 mg total) by mouth every 12 (twelve) hours. 05/18/18   Debbe Odea, MD  clonazePAM (KLONOPIN) 0.5 MG tablet Take 0.5 mg by mouth 2 (two) times daily as needed for anxiety.    [provider]  CVS VITAMIN B12 1000 MCG tablet Take 1,000 mcg by mouth daily. 05/02/18   [provider]  Dexlansoprazole (DEXILANT) 30 MG capsule Take 30 mg by mouth daily.    [provider]  guaiFENesin (MUCINEX) 600 MG 12 hr tablet Take 1 tablet (600 mg total) by mouth 2 (two) times daily as needed. 05/18/18   Debbe Odea, MD  INVEGA SUSTENNA 156 MG/ML SUSY injection Inject 156 mg into the muscle every 30 (thirty) days. 04/28/18   [provider]  Multiple Vitamins-Minerals (CVS SPECTRAVITE PO) Take 1 tablet by mouth daily.    [provider]  tamsulosin (FLOMAX) 0.4 MG CAPS capsule Take 1 capsule (0.4 mg total) by mouth daily after supper. 05/18/18   Debbe Odea, MD    Family History Family History  Problem Relation Age of Onset  . Diabetes Mellitus II Mother   . Diabetes Mellitus II Maternal Grandmother   . Heart disease Maternal Grandmother     Social History Social History   Tobacco Use  . Smoking status: Former Smoker    Types: Cigarettes  . Smokeless tobacco: Never Used  Substance Use Topics  . Alcohol use: Not Currently  . Drug use: Not Currently     Allergies   Patient has no known allergies.   Review of Systems Review of Systems  Constitutional: Negative for fever.  Gastrointestinal: Positive for abdominal pain. Negative for vomiting.  Genitourinary: Positive for difficulty urinating. Negative for flank pain.  Musculoskeletal: Negative for back pain.  All  other systems reviewed and are negative.    Physical Exam Updated Vital Signs BP (!) 146/75   Pulse 68   Temp 98.2 F (36.8 C) (Oral)   Resp 18   Wt 71.8 kg   SpO2 99%   BMI 24.07 kg/m   Physical Exam  Constitutional: He is oriented to person, place, and time. He appears well-developed and well-nourished.  Non-toxic appearance. He does not appear ill.  HENT:  Head: Normocephalic and atraumatic.  Right Ear: External ear normal.  Left Ear: External ear normal.  Nose: Nose normal.  Eyes: Right eye exhibits no discharge. Left eye exhibits no discharge.  Neck: Neck supple.  Cardiovascular: Normal rate, regular rhythm and normal heart sounds.  Pulmonary/Chest: Effort normal and breath sounds normal.  Abdominal: Soft. There is tenderness in the suprapubic area. There is no CVA tenderness.  Genitourinary:  Genitourinary Comments: Foley catheter in place, minimal yellow urine in bag  Musculoskeletal: He exhibits no edema.  Neurological: He is alert and oriented to person, place, and time.  Skin: Skin is warm and dry.  Psychiatric: His mood appears not anxious.  Nursing note and vitals reviewed.    ED Treatments / Results  Labs (all labs ordered are listed, but only abnormal results are displayed) Labs Reviewed  BASIC METABOLIC PANEL - Abnormal; Notable for the following components:      Result Value   Glucose, Bld 170 (*)    BUN 31 (*)    Creatinine, Ser 1.77 (*)    GFR calc non Af Amer 36 (*)    GFR calc Af Amer 41 (*)    All other components within normal limits  CBC WITH DIFFERENTIAL/PLATELET - Abnormal; Notable for the following components:   WBC 11.5 (*)    RBC 3.40 (*)    Hemoglobin 9.4 (*)    HCT 30.6 (*)    Neutro Abs 9.9 (*)    Abs Immature Granulocytes 0.21 (*)    All other components within normal limits  URINALYSIS, ROUTINE W REFLEX MICROSCOPIC - Abnormal; Notable for the following components:   Hgb urine dipstick SMALL (*)    Protein, ur 100 (*)     Leukocytes, UA SMALL (*)    Bacteria, UA RARE (*)    All other components within normal limits  URINE CULTURE    EKG None  Radiology No results found.  Procedures Procedures (including critical care time)  Medications Ordered in ED Medications - No data to display   Initial Impression / Assessment and Plan / ED Course  I have reviewed the triage vital signs and the nursing notes.  Pertinent labs & imaging results that were available during my care of the patient were reviewed by me and considered in my medical decision making (see chart  for details).     Patient's abdominal pain, distention, and hypertension all resolved after Foley catheter was removed and then replaced.  He tried to urinate some on his own in between, but while he could get some urine out he could not fully empty his bladder.  His renal function is improved from when he was discharged a few days ago.   He does not have any CVA tenderness, residual abdominal pain on exam after the Foley catheter, or fever.  While he does have some white blood cells in his urine, there is rare bacteria.  My suspicion is this is not an infectious problem but more of a mechanical problem or possibly from when he tried to pull out the catheter.  It will be sent for culture but I would not put him on new or continued antibiotics at this time.  Discussed the importance of following up with urology.  Return precautions.  Final Clinical Impressions(s) / ED Diagnoses   Final diagnoses:  Acute urinary retention    ED Discharge Orders    None      Sherwood Gambler, MD 05/22/18 1511

## 2018-05-22 NOTE — Discharge Instructions (Addendum)
Return to the ER or see her doctor if you develop fever, vomiting, back or flank pain, or abdominal pain.  Otherwise do not manipulate the Foley catheter and follow-up with urology.

## 2018-05-23 LAB — URINE CULTURE: CULTURE: NO GROWTH

## 2019-09-05 ENCOUNTER — Observation Stay (HOSPITAL_COMMUNITY): Payer: Medicare Other

## 2019-09-05 ENCOUNTER — Encounter (HOSPITAL_COMMUNITY): Payer: Self-pay

## 2019-09-05 ENCOUNTER — Inpatient Hospital Stay (HOSPITAL_COMMUNITY)
Admission: EM | Admit: 2019-09-05 | Discharge: 2019-09-07 | DRG: 312 | Disposition: A | Discharge status: Home / Self Care | Payer: Medicare Other | Attending: Family Medicine | Admitting: Family Medicine

## 2019-09-05 ENCOUNTER — Other Ambulatory Visit: Payer: Self-pay

## 2019-09-05 DIAGNOSIS — N184 Chronic kidney disease, stage 4 (severe): Secondary | ICD-10-CM | POA: Diagnosis present

## 2019-09-05 DIAGNOSIS — F259 Schizoaffective disorder, unspecified: Secondary | ICD-10-CM

## 2019-09-05 DIAGNOSIS — R55 Syncope and collapse: Secondary | ICD-10-CM | POA: Diagnosis not present

## 2019-09-05 DIAGNOSIS — Z833 Family history of diabetes mellitus: Secondary | ICD-10-CM

## 2019-09-05 DIAGNOSIS — Z87440 Personal history of urinary (tract) infections: Secondary | ICD-10-CM

## 2019-09-05 DIAGNOSIS — I129 Hypertensive chronic kidney disease with stage 1 through stage 4 chronic kidney disease, or unspecified chronic kidney disease: Secondary | ICD-10-CM | POA: Diagnosis present

## 2019-09-05 DIAGNOSIS — E8809 Other disorders of plasma-protein metabolism, not elsewhere classified: Secondary | ICD-10-CM | POA: Diagnosis present

## 2019-09-05 DIAGNOSIS — N179 Acute kidney failure, unspecified: Secondary | ICD-10-CM | POA: Diagnosis not present

## 2019-09-05 DIAGNOSIS — N189 Chronic kidney disease, unspecified: Secondary | ICD-10-CM

## 2019-09-05 DIAGNOSIS — E876 Hypokalemia: Secondary | ICD-10-CM | POA: Diagnosis present

## 2019-09-05 DIAGNOSIS — Z79899 Other long term (current) drug therapy: Secondary | ICD-10-CM

## 2019-09-05 DIAGNOSIS — I25118 Atherosclerotic heart disease of native coronary artery with other forms of angina pectoris: Secondary | ICD-10-CM | POA: Diagnosis not present

## 2019-09-05 DIAGNOSIS — I4581 Long QT syndrome: Secondary | ICD-10-CM | POA: Diagnosis present

## 2019-09-05 DIAGNOSIS — Z8249 Family history of ischemic heart disease and other diseases of the circulatory system: Secondary | ICD-10-CM

## 2019-09-05 DIAGNOSIS — J8410 Pulmonary fibrosis, unspecified: Secondary | ICD-10-CM | POA: Diagnosis present

## 2019-09-05 DIAGNOSIS — I951 Orthostatic hypotension: Secondary | ICD-10-CM | POA: Diagnosis not present

## 2019-09-05 DIAGNOSIS — Z20822 Contact with and (suspected) exposure to covid-19: Secondary | ICD-10-CM | POA: Diagnosis present

## 2019-09-05 DIAGNOSIS — M1990 Unspecified osteoarthritis, unspecified site: Secondary | ICD-10-CM | POA: Diagnosis present

## 2019-09-05 DIAGNOSIS — I2510 Atherosclerotic heart disease of native coronary artery without angina pectoris: Secondary | ICD-10-CM | POA: Diagnosis present

## 2019-09-05 DIAGNOSIS — I472 Ventricular tachycardia: Secondary | ICD-10-CM | POA: Diagnosis present

## 2019-09-05 DIAGNOSIS — E785 Hyperlipidemia, unspecified: Secondary | ICD-10-CM | POA: Diagnosis present

## 2019-09-05 DIAGNOSIS — Z87891 Personal history of nicotine dependence: Secondary | ICD-10-CM

## 2019-09-05 DIAGNOSIS — K219 Gastro-esophageal reflux disease without esophagitis: Secondary | ICD-10-CM | POA: Diagnosis present

## 2019-09-05 DIAGNOSIS — Z7982 Long term (current) use of aspirin: Secondary | ICD-10-CM

## 2019-09-05 DIAGNOSIS — R9431 Abnormal electrocardiogram [ECG] [EKG]: Secondary | ICD-10-CM

## 2019-09-05 DIAGNOSIS — Z951 Presence of aortocoronary bypass graft: Secondary | ICD-10-CM

## 2019-09-05 LAB — COMPREHENSIVE METABOLIC PANEL
ALT: 13 U/L (ref 0–44)
AST: 30 U/L (ref 15–41)
Albumin: 2.9 g/dL — ABNORMAL LOW (ref 3.5–5.0)
Alkaline Phosphatase: 81 U/L (ref 38–126)
Anion gap: 12 (ref 5–15)
BUN: 26 mg/dL — ABNORMAL HIGH (ref 8–23)
CO2: 21 mmol/L — ABNORMAL LOW (ref 22–32)
Calcium: 4.5 mg/dL — CL (ref 8.9–10.3)
Chloride: 107 mmol/L (ref 98–111)
Creatinine, Ser: 2.65 mg/dL — ABNORMAL HIGH (ref 0.61–1.24)
GFR calc Af Amer: 26 mL/min — ABNORMAL LOW (ref >60)
GFR calc non Af Amer: 22 mL/min — ABNORMAL LOW (ref >60)
Glucose, Bld: 113 mg/dL — ABNORMAL HIGH (ref 70–99)
Potassium: 3.2 mmol/L — ABNORMAL LOW (ref 3.5–5.1)
Sodium: 140 mmol/L (ref 135–145)
Total Bilirubin: 0.8 mg/dL (ref 0.3–1.2)
Total Protein: 6.3 g/dL — ABNORMAL LOW (ref 6.5–8.1)

## 2019-09-05 LAB — TSH: TSH: 4.328 u[IU]/mL (ref 0.350–4.500)

## 2019-09-05 LAB — CBC WITH DIFFERENTIAL/PLATELET
Abs Immature Granulocytes: 0.07 10*3/uL (ref 0.00–0.07)
Basophils Absolute: 0 10*3/uL (ref 0.0–0.1)
Basophils Relative: 0 %
Eosinophils Absolute: 0.2 10*3/uL (ref 0.0–0.5)
Eosinophils Relative: 2 %
HCT: 29.5 % — ABNORMAL LOW (ref 39.0–52.0)
Hemoglobin: 9.6 g/dL — ABNORMAL LOW (ref 13.0–17.0)
Immature Granulocytes: 1 %
Lymphocytes Relative: 17 %
Lymphs Abs: 1.2 10*3/uL (ref 0.7–4.0)
MCH: 30.8 pg (ref 26.0–34.0)
MCHC: 32.5 g/dL (ref 30.0–36.0)
MCV: 94.6 fL (ref 80.0–100.0)
Monocytes Absolute: 0.6 10*3/uL (ref 0.1–1.0)
Monocytes Relative: 8 %
Neutro Abs: 5.2 10*3/uL (ref 1.7–7.7)
Neutrophils Relative %: 72 %
Platelets: 154 10*3/uL (ref 150–400)
RBC: 3.12 MIL/uL — ABNORMAL LOW (ref 4.22–5.81)
RDW: 13.2 % (ref 11.5–15.5)
WBC: 7.2 10*3/uL (ref 4.0–10.5)
nRBC: 0 % (ref 0.0–0.2)

## 2019-09-05 LAB — LACTIC ACID, PLASMA
Lactic Acid, Venous: 1.9 mmol/L (ref 0.5–1.9)
Lactic Acid, Venous: 0.8 mmol/L (ref 0.5–1.9)

## 2019-09-05 LAB — TROPONIN I (HIGH SENSITIVITY)
Troponin I (High Sensitivity): 14 ng/L (ref <18)
Troponin I (High Sensitivity): 15 ng/L (ref <18)

## 2019-09-05 LAB — PHOSPHORUS: Phosphorus: 3.2 mg/dL (ref 2.5–4.6)

## 2019-09-05 LAB — MAGNESIUM: Magnesium: 0.7 mg/dL — CL (ref 1.7–2.4)

## 2019-09-05 MED ORDER — ATENOLOL 25 MG PO TABS
25.0000 mg | ORAL_TABLET | Freq: Every day | ORAL | Status: DC
  Administered 2019-09-06 – 2019-09-07 (×2): 25 mg via ORAL
  Filled 2019-09-05 (×2): qty 1

## 2019-09-05 MED ORDER — ASPIRIN 325 MG PO TABS
325.0000 mg | ORAL_TABLET | Freq: Every day | ORAL | Status: DC
  Administered 2019-09-06 – 2019-09-07 (×2): 325 mg via ORAL
  Filled 2019-09-05 (×2): qty 1

## 2019-09-05 MED ORDER — SODIUM CHLORIDE 0.9% FLUSH
3.0000 mL | Freq: Two times a day (BID) | INTRAVENOUS | Status: DC
  Administered 2019-09-05 – 2019-09-07 (×3): 3 mL via INTRAVENOUS

## 2019-09-05 MED ORDER — PANTOPRAZOLE SODIUM 40 MG PO TBEC
40.0000 mg | DELAYED_RELEASE_TABLET | Freq: Every day | ORAL | Status: DC
  Administered 2019-09-06 – 2019-09-07 (×2): 40 mg via ORAL
  Filled 2019-09-05 (×2): qty 1

## 2019-09-05 MED ORDER — ENOXAPARIN SODIUM 30 MG/0.3ML ~~LOC~~ SOLN
30.0000 mg | Freq: Every day | SUBCUTANEOUS | Status: DC
  Administered 2019-09-06 – 2019-09-07 (×2): 30 mg via SUBCUTANEOUS
  Filled 2019-09-05 (×2): qty 0.3

## 2019-09-05 MED ORDER — MAGNESIUM SULFATE IN D5W 1-5 GM/100ML-% IV SOLN
1.0000 g | Freq: Once | INTRAVENOUS | Status: AC
  Administered 2019-09-05: 1 g via INTRAVENOUS
  Filled 2019-09-05: qty 100

## 2019-09-05 MED ORDER — CLONAZEPAM 0.5 MG PO TABS
0.5000 mg | ORAL_TABLET | Freq: Two times a day (BID) | ORAL | Status: DC
  Administered 2019-09-05 – 2019-09-07 (×4): 0.5 mg via ORAL
  Filled 2019-09-05 (×4): qty 1

## 2019-09-05 MED ORDER — POTASSIUM CHLORIDE CRYS ER 20 MEQ PO TBCR
40.0000 meq | EXTENDED_RELEASE_TABLET | Freq: Once | ORAL | Status: AC
  Administered 2019-09-05: 40 meq via ORAL
  Filled 2019-09-05: qty 2

## 2019-09-05 MED ORDER — ACETAMINOPHEN 650 MG RE SUPP: 650.0000 mg | Freq: Four times a day (QID) | RECTAL | Status: DC | PRN

## 2019-09-05 MED ORDER — CALCIUM GLUCONATE-NACL 1-0.675 GM/50ML-% IV SOLN
1.0000 g | Freq: Once | INTRAVENOUS | Status: AC
  Administered 2019-09-05: 1000 mg via INTRAVENOUS
  Filled 2019-09-05: qty 50

## 2019-09-05 MED ORDER — ATORVASTATIN CALCIUM 20 MG PO TABS
20.0000 mg | ORAL_TABLET | Freq: Every day | ORAL | Status: DC
  Administered 2019-09-05 – 2019-09-06 (×2): 20 mg via ORAL
  Filled 2019-09-05: qty 1
  Filled 2019-09-05: qty 2
  Filled 2019-09-05 (×2): qty 1
  Filled 2019-09-05: qty 2

## 2019-09-05 MED ORDER — SODIUM CHLORIDE 0.9 % IV BOLUS
500.0000 mL | Freq: Once | INTRAVENOUS | Status: AC
  Administered 2019-09-05: 500 mL via INTRAVENOUS

## 2019-09-05 MED ORDER — SODIUM CHLORIDE 0.9 % IV SOLN: INTRAVENOUS | Status: DC

## 2019-09-05 MED ORDER — BENZTROPINE MESYLATE 0.5 MG PO TABS
0.5000 mg | ORAL_TABLET | Freq: Two times a day (BID) | ORAL | Status: DC
  Administered 2019-09-05 – 2019-09-07 (×4): 0.5 mg via ORAL
  Filled 2019-09-05 (×5): qty 1

## 2019-09-05 MED ORDER — ACETAMINOPHEN 325 MG PO TABS: 650.0000 mg | ORAL_TABLET | Freq: Four times a day (QID) | ORAL | Status: DC | PRN

## 2019-09-05 MED ORDER — TAMSULOSIN HCL 0.4 MG PO CAPS
0.4000 mg | ORAL_CAPSULE | Freq: Every day | ORAL | Status: DC
  Administered 2019-09-06 – 2019-09-07 (×2): 0.4 mg via ORAL
  Filled 2019-09-05 (×2): qty 1

## 2019-09-05 MED ORDER — SODIUM CHLORIDE 0.9 % IV SOLN: 1.0000 g | Freq: Once | INTRAVENOUS | Status: DC

## 2019-09-05 MED ORDER — CALCIUM GLUCONATE-NACL 1-0.675 GM/50ML-% IV SOLN
1.0000 g | Freq: Once | INTRAVENOUS | Status: AC
  Administered 2019-09-06: 1000 mg via INTRAVENOUS
  Filled 2019-09-05: qty 50

## 2019-09-05 NOTE — ED Notes (Signed)
Date and time results received: 09/05/19 7:20 PM  Test: Calcium Critical Value: 4.5  Name of Provider Notified: Tamera Punt, MD  Orders Received? Or Actions Taken?:

## 2019-09-05 NOTE — H&P (Signed)
History and Physical    Robert Little LFY:[id number] DOB: [birth year] DOA: 09/05/2019  PCP: Patient, No Pcp Per  Patient coming from: [place] have personally briefly reviewed patient's old medical records in [place]  Chief Complaint: Syncope  HPI: Robert Little is a 79 y.o. male with medical history significant of CAD s/p CABG, HTN, schizophrenia.  Pt presents to ED after near syncope episode.  Lives in group home.  His caretaker was in the bath and said they he asked the patient if he was feeling okay and the patient said he felt great.  He then started to stand up and he got fatigued and felt like he had to sit back down.  Few minutes later he had a near syncopal event.  The caregiver said that he had his eyes open but was not responding.  He was breathing normally but at 1 point he started to lose his color and the caregiver gave him some intermittent mouth-to-mouth breaths.  He does say that the patient was breathing normally however during these times.  Shortly after that he, the patient became more responsive and by the time EMS arrived, he was back to his baseline mental status.  EMS noted that his blood pressure on scene was 88/60 although following that it was back in the normal range.  Patient did not fall or injure himself.  Currently he says he feels fine and denies any complaints.  The caregiver says that he does go through stages where he does not eat well for whatever reason.  He is currently in one of those stages now and has not really been eating much recently.  Patient states he did have an episode of chest pain earlier this morning on his left side.  He says it only lasted a short time and has not had any further episodes.  He says that he has the same type pain intermittently.  He denies any shortness of breath.  No recent illnesses.  No fevers, cough or congestion.   ED Course: Trop neg.  Creat 2.65 (1.77 in 2019), BUN 26, Calcium 4.5, Mg 0.7.  QTc 515 on  EKG, no acute ischemic changes.  UA pending  CXR without any acute changes.  HGB 9.6 (9.4 in 2019)  K 3.2   Review of Systems: As per HPI, otherwise all review of systems negative.  Past Medical History:  Diagnosis Date  . Anemia   . Coronary artery disease   . GERD (gastroesophageal reflux disease)   . Hypertension   . Pulmonary fibrosis ([place])   . Schizo affective schizophrenia [facility])     Past Surgical History:  Procedure Laterality Date  . CORONARY ARTERY BYPASS GRAFT       reports that he has quit smoking. His smoking use included cigarettes. He has never used smokeless tobacco. He reports previous alcohol use. He reports previous drug use.  No Known Allergies  Family History  Problem Relation Age of Onset  . Diabetes Mellitus II Mother   . Diabetes Mellitus II Maternal Grandmother   . Heart disease Maternal Grandmother      Prior to Admission medications   Medication Sig Start Date End Date Taking? Authorizing Provider  acetaminophen (TYLENOL) 325 MG tablet Take 2 tablets (650 mg total) by mouth every 6 (six) hours as needed for mild pain (or Fever >/= 101). 05/18/18  Yes Debbe Odea, MD  aspirin 325 MG tablet Take 325 mg by mouth daily.   Yes [provider]  atenolol (TENORMIN) 25 MG tablet Take 25 mg by mouth daily.   Yes [provider]  atorvastatin (LIPITOR) 20 MG tablet Take 20 mg by mouth at bedtime.   Yes [provider]  benztropine (COGENTIN) 0.5 MG tablet Take 0.5 mg by mouth 2 (two) times daily.   Yes [provider]  clonazePAM (KLONOPIN) 0.5 MG tablet Take 0.5 mg by mouth 2 (two) times daily.    Yes [provider]  Dexlansoprazole (DEXILANT) 30 MG capsule Take 30 mg by mouth daily.   Yes [provider]  INVEGA SUSTENNA 156 MG/ML SUSY injection Inject 156 mg into the muscle every 30 (thirty) days. 04/28/18  Yes [provider]  lisinopril (ZESTRIL) 10 MG tablet Take 10 mg by mouth  every morning. 07/17/19  Yes [provider]  Multiple Vitamins-Minerals ([employer]S SPECTRAVITE PO) Take 1 tablet by mouth daily.   Yes [provider]  tamsulosin (FLOMAX) 0.4 MG CAPS capsule Take 1 capsule (0.4 mg total) by mouth daily after supper. Patient taking differently: Take 0.4 mg by mouth daily.  05/18/18  Yes Debbe Odea, MD    Physical Exam: Vitals:   09/05/19 1815 09/05/19 1900 09/05/19 2000 09/05/19 2031  BP: 140/73 131/73 (!) 137/58 (!) 146/68  Pulse: 70 68 69 73  Resp: 20 (!) 27 16 (!) 22  Temp: (!) 97.4 F (36.3 C)     TempSrc: Oral     SpO2: 99% 99% 98% 99%    Constitutional: NAD, calm, comfortable Eyes: PERRL, lids and conjunctivae normal ENMT: Mucous membranes are moist. Posterior pharynx clear of any exudate or lesions.Normal dentition.  Neck: normal, supple, no masses, no thyromegaly Respiratory: clear to auscultation bilaterally, no wheezing, no crackles. Normal respiratory effort. No accessory muscle use.  Cardiovascular: Regular rate and rhythm, no murmurs / rubs / gallops. No extremity edema. 2+ pedal pulses. No carotid bruits.  Abdomen: no tenderness, no masses palpated. No hepatosplenomegaly. Bowel sounds positive.  Musculoskeletal: no clubbing / cyanosis. No joint deformity upper and lower extremities. Good ROM, no contractures. Normal muscle tone.  Skin: no rashes, lesions, ulcers. No induration Neurologic: CN 2-12 grossly intact. Sensation intact, DTR normal. Strength 5/5 in all 4.  Psychiatric: Normal judgment and insight. Alert and oriented x 3. Normal mood.    Labs on Admission: I have personally reviewed following labs and imaging studies  CBC: Recent Labs  Lab 09/05/19 1817  WBC 7.2  NEUTROABS 5.2  HGB 9.6*  HCT 29.5*  MCV 94.6  PLT 518   Basic Metabolic Panel: Recent Labs  Lab 09/05/19 1817  NA 140  K 3.2*  CL 107  CO2 21*  GLUCOSE 113*  BUN 26*  CREATININE 2.65*  CALCIUM 4.5*  MG 0.7*   GFR: CrCl cannot be  calculated (Unknown ideal weight.). Liver Function Tests: Recent Labs  Lab 09/05/19 1817  AST 30  ALT 13  ALKPHOS 81  BILITOT 0.8  PROT 6.3*  ALBUMIN 2.9*   No results for input(s): LIPASE, AMYLASE in the last 168 hours. No results for input(s): AMMONIA in the last 168 hours. Coagulation Profile: No results for input(s): INR, PROTIME in the last 168 hours. Cardiac Enzymes: No results for input(s): CKTOTAL, CKMB, CKMBINDEX, TROPONINI in the last 168 hours. BNP (last 3 results) No results for input(s): PROBNP in the last 8760 hours. HbA1C: No results for input(s): HGBA1C in the last 72 hours. CBG: No results for input(s): GLUCAP in the last 168 hours. Lipid Profile: No  results for input(s): CHOL, HDL, LDLCALC, TRIG, CHOLHDL, LDLDIRECT in the last 72 hours. Thyroid Function Tests: No results for input(s): TSH, T4TOTAL, FREET4, T3FREE, THYROIDAB in the last 72 hours. Anemia Panel: No results for input(s): VITAMINB12, FOLATE, FERRITIN, TIBC, IRON, RETICCTPCT in the last 72 hours. Urine analysis:    Component Value Date/Time   COLORURINE YELLOW 05/22/2018 [place] 05/22/2018 1355   LABSPEC 1.010 05/22/2018 1355   PHURINE 5.0 05/22/2018 [place] 05/22/2018 1355   HGBUR SMALL (A) 05/22/2018 1355   BILIRUBINUR NEGATIVE 05/22/2018 1355   KETONESUR NEGATIVE 05/22/2018 1355   PROTEINUR 100 (A) 05/22/2018 1355   NITRITE NEGATIVE 05/22/2018 1355   LEUKOCYTESUR SMALL (A) 05/22/2018 1355    Radiological Exams on Admission: No results found.  EKG: Independently reviewed.  Assessment/Plan Principal Problem:   Syncope Active Problems:   Coronary artery disease   Schizo affective schizophrenia ([place])   Acute kidney injury superimposed on CKD ([place])   Hypocalcemia   Hypomagnesemia    1. Syncope - 1. Syncope pathway 2. Correct electrolytes (Mg, Ca, K) 3. Check phos 4. Check TSH 5. 2d echo 6. Tele monitor 7. Repeat CMP, Mg in  AM 2. Hypomagnesemia and hypocalcemia - 1. Replace, 2 grams of each 2. Hypomagnesemia likely leading to hypocalcemia I suspect 3. ? Decreased PO intake? 4. Check PTH and Vit D in AM (though this wouldn't explain the hypomag) 3. CAD - 1. Cont ASA and statin 4. CKD - ? CKD stage 4 vs superimposed AKI 1. Hold lisinopril 2. Gentle hydration 3. Strict intake and output 4. If no improvement (and he has CKD stage 4 as I suspect), needs nephrology consult / follow up. 5. Schizophrenia - 1. Due for Invega today 2. Hold for the moment to allow for electrolyte replacement first. 3. Consider giving his invega tomorrow depending on how QTc looks post replacement.  DVT prophylaxis: Lovenox Code Status: Full Family Communication: No family in room Disposition Plan: Group Home after admit Consults called: None Admission status: Place in 46   Robert Little, [place] Hospitalists  How to contact the [facility] Attending or Consulting provider [place] or covering provider during after hours [place], for this patient?  1. Check the care team in [facility] and look for a) attending/consulting [facility] provider listed and b) the [facility] team listed 2. Log into [email or web address]  Amion Physician Scheduling and messaging for groups and whole hospitals  On call and physician scheduling software for group practices, residents, hospitalists and other medical providers for call, clinic, rotation and shift schedules. On[employer] is a hospital-wide system for scheduling doctors and paging doctors on call. EasyPlot is for scientific plotting and data analysis.  [email or web address]  and use [place]'s universal password to access. If you do not have the password, please contact the hospital operator.  3. Locate the [facility] provider you are looking for under [facility] and page to a number that you can be directly reached. 4. If you still have difficulty reaching the provider, please page the [facility] (Director on Call) for the  Hospitalists listed on amion for assistance.  09/05/2019, 9:07 PM

## 2019-09-05 NOTE — ED Triage Notes (Addendum)
Pt arrives [facility] from a group home [place] [address]. Pts caregiver reported that the he was giving the pt a bath when the pt had a near syncopal episode. Per EMS when fire arrived the pts BP was 88/60. Per EMS pts BP has been consistently 395/844 systolic. Pt did not fall or lose consciousness. Pt is alert and oriented at this time. Per caregiver: concerns for UTI and FTT

## 2019-09-05 NOTE — ED Provider Notes (Signed)
[facility] DEPT Provider Note   CSN: [id number] Arrival date & time: 09/05/19  1754     History Chief Complaint  Patient presents with  . Near Syncope    Robert Little is a 79 y.o. male.  Patient is a 79 year old male with a history of schizoaffective disorder, hypertension, GERD, coronary artery disease status post bypass surgery who presents after a near syncopal type episode.  He lives in a group home.  His caretaker was in the bath and said they he asked the patient if he was feeling okay and the patient said he felt great.  He then started to stand up and he got fatigued and felt like he had to sit back down.  Few minutes later he had a near syncopal event.  The caregiver said that he had his eyes open but was not responding.  He was breathing normally but at 1 point he started to lose his color and the caregiver gave him some intermittent mouth-to-mouth breaths.  He does say that the patient was breathing normally however during these times.  Shortly after that he, the patient became more responsive and by the time EMS arrived, he was back to his baseline mental status.  EMS noted that his blood pressure on scene was 88/60 although following that it was back in the normal range.  Patient did not fall or injure himself.  Currently he says he feels fine and denies any complaints.  The caregiver says that he does go through stages where he does not eat well for whatever reason.  He is currently 1 of those stages now and has not really been eating much recently.  He is also been less active because of his arthritis in his legs have been acting up.  Patient states he did have an episode of chest pain earlier this morning on his left side.  He says it only lasted a short time and has not had any further episodes.  He says that he has the same type pain intermittently.  He denies any shortness of breath.  No recent illnesses.  No fevers, cough or congestion.  His caregiver  said he has had some similar type symptoms with a UTI in the past.        Past Medical History:  Diagnosis Date  . Anemia   . Coronary artery disease   . GERD (gastroesophageal reflux disease)   . Hypertension   . Pulmonary fibrosis ([place])   . Schizo affective schizophrenia [facility])     Patient Active Problem List   Diagnosis Date Noted  . Acute urinary retention 05/18/2018  . UTI (urinary tract infection) 05/15/2018  . Sepsis ([place]) 05/15/2018  . Hypokalemia 05/15/2018  . AKI (acute kidney injury) ([place]) 05/15/2018  . HLD (hyperlipidemia) 05/15/2018  . Coronary artery disease   . Hypertension   . Pulmonary fibrosis ([place])   . Schizo affective schizophrenia ([place])   . Anemia     Past Surgical History:  Procedure Laterality Date  . CORONARY ARTERY BYPASS GRAFT         Family History  Problem Relation Age of Onset  . Diabetes Mellitus II Mother   . Diabetes Mellitus II Maternal Grandmother   . Heart disease Maternal Grandmother     Social History   Tobacco Use  . Smoking status: Former Smoker    Types: Cigarettes  . Smokeless tobacco: Never Used  Substance Use Topics  . Alcohol use: Not Currently  . Drug use:  Not Currently    Home Medications Prior to Admission medications   Medication Sig Start Date End Date Taking? Authorizing Provider  acetaminophen (TYLENOL) 325 MG tablet Take 2 tablets (650 mg total) by mouth every 6 (six) hours as needed for mild pain (or Fever >/= 101). 05/18/18   Debbe Odea, MD  aspirin 325 MG tablet Take 325 mg by mouth daily.    [provider]  atenolol (TENORMIN) 25 MG tablet Take 25 mg by mouth daily.    [provider]  atorvastatin (LIPITOR) 20 MG tablet Take 20 mg by mouth at bedtime.    [provider]  benztropine (COGENTIN) 0.5 MG tablet Take 0.5 mg by mouth 2 (two) times daily.    [provider]  cephALEXin (KEFLEX) 500 MG capsule Take 1 capsule (500 mg total) by mouth every 12 (twelve)  hours. 05/18/18   Debbe Odea, MD  clonazePAM (KLONOPIN) 0.5 MG tablet Take 0.5 mg by mouth 2 (two) times daily as needed for anxiety.    [provider]  [employer]S VITAMIN B12 1000 MCG tablet Take 1,000 mcg by mouth daily. 05/02/18   [provider]  Dexlansoprazole (DEXILANT) 30 MG capsule Take 30 mg by mouth daily.    [provider]  guaiFENesin (MUCINEX) 600 MG 12 hr tablet Take 1 tablet (600 mg total) by mouth 2 (two) times daily as needed. 05/18/18   Debbe Odea, MD  INVEGA SUSTENNA 156 MG/ML SUSY injection Inject 156 mg into the muscle every 30 (thirty) days. 04/28/18   [provider]  Multiple Vitamins-Minerals (CVS SPECTRAVITE PO) Take 1 tablet by mouth daily.    [provider]  tamsulosin (FLOMAX) 0.4 MG CAPS capsule Take 1 capsule (0.4 mg total) by mouth daily after supper. 05/18/18   Debbe Odea, MD    Allergies    Patient has no known allergies.  Review of Systems   Review of Systems  Constitutional: Positive for activity change, appetite change and fatigue. Negative for chills, diaphoresis and fever.  HENT: Negative for congestion, rhinorrhea and sneezing.   Eyes: Negative.   Respiratory: Negative for cough, chest tightness and shortness of breath.   Cardiovascular: Positive for chest pain. Negative for leg swelling.  Gastrointestinal: Negative for abdominal pain, blood in stool, diarrhea, nausea and vomiting.  Genitourinary: Negative for difficulty urinating, flank pain, frequency and hematuria.  Musculoskeletal: Positive for arthralgias. Negative for back pain.  Skin: Negative for rash.  Neurological: Positive for syncope (Near syncope). Negative for dizziness, speech difficulty, weakness, numbness and headaches.    Physical Exam Updated Vital Signs BP (!) 137/58   Pulse 69   Temp (!) 97.4 F (36.3 C) (Oral)   Resp 16   SpO2 98%   Physical Exam Constitutional:      Appearance: He is well-developed.  HENT:      Head: Normocephalic and atraumatic.  Eyes:     Pupils: Pupils are equal, round, and reactive to light.  Cardiovascular:     Rate and Rhythm: Normal rate and regular rhythm.     Heart sounds: Normal heart sounds.  Pulmonary:     Effort: Pulmonary effort is normal. No respiratory distress.     Breath sounds: Normal breath sounds. No wheezing or rales.  Chest:     Chest wall: No tenderness.  Abdominal:     General: Bowel sounds are normal.     Palpations: Abdomen is soft.     Tenderness: There is no abdominal tenderness. There is no  guarding or rebound.  Musculoskeletal:        General: Normal range of motion.     Cervical back: Normal range of motion and neck supple.  Lymphadenopathy:     Cervical: No cervical adenopathy.  Skin:    General: Skin is warm and dry.     Findings: No rash.  Neurological:     General: No focal deficit present.     Mental Status: He is alert and oriented to person, place, and time.     ED Results / Procedures / Treatments   Labs (all labs ordered are listed, but only abnormal results are displayed) Labs Reviewed  COMPREHENSIVE METABOLIC PANEL - Abnormal; Notable for the following components:      Result Value   Potassium 3.2 (*)    CO2 21 (*)    Glucose, Bld 113 (*)    BUN 26 (*)    Creatinine, Ser 2.65 (*)    Calcium 4.5 (*)    Total Protein 6.3 (*)    Albumin 2.9 (*)    GFR calc non Af Amer 22 (*)    GFR calc Af Amer 26 (*)    All other components within normal limits  CBC WITH DIFFERENTIAL/PLATELET - Abnormal; Notable for the following components:   RBC 3.12 (*)    Hemoglobin 9.6 (*)    HCT 29.5 (*)    All other components within normal limits  MAGNESIUM - Abnormal; Notable for the following components:   Magnesium 0.7 (*)    All other components within normal limits  LACTIC ACID, PLASMA  LACTIC ACID, PLASMA  URINALYSIS, ROUTINE W REFLEX MICROSCOPIC  TROPONIN I (HIGH SENSITIVITY)  TROPONIN I (HIGH SENSITIVITY)    EKG EKG  Interpretation  Date/Time:  Wednesday September 05 2019 18:17:48 EST Ventricular Rate:  66 PR Interval:    QRS Duration: 91 QT Interval:  491 QTC Calculation: 515 R Axis:   1 Text Interpretation: Sinus rhythm Prolonged QT interval Confirmed by Malvin Johns [phone]) on 09/05/2019 6:59:03 PM   Radiology No results found.  Procedures Procedures (including critical care time)  Medications Ordered in ED Medications  calcium gluconate 1 g/ 50 mL sodium chloride IVPB (1,000 mg Intravenous New Bag/Given 09/05/19 2010)  magnesium sulfate IVPB 1 g 100 mL (1 g Intravenous New Bag/Given 09/05/19 2011)  magnesium sulfate IVPB 1 g 100 mL (has no administration in time range)  calcium gluconate 1 g/ 50 mL sodium chloride IVPB (has no administration in time range)  potassium chloride SA (KLOR-CON) CR tablet 40 mEq (40 mEq Oral Given 09/05/19 2003)    ED Course  I have reviewed the triage vital signs and the nursing notes.  Pertinent labs & imaging results that were available during my care of the patient were reviewed by me and considered in my medical decision making (see chart for details).    MDM Rules/Calculators/A&P                      Patient is a 79 year old male who presents after a near syncopal type event.  He is neurologically intact.  He was gently hydrated in the ED.  His EKG shows a long QT interval but otherwise nonconcerning.  His labs show a significant hypocalcemia and a significantly low magnesium.  Replacement was ordered in the ED.  He also had hypokalemia and his potassium was replaced.  I spoke with Dr. Alcario Drought who will admit the patient for further treatment. Final Clinical Impression(s) /  ED Diagnoses Final diagnoses:  Near syncope  Hypocalcemia  Hypomagnesemia  Long QT interval    Rx / DC Orders ED Discharge Orders    None       Malvin Johns, MD 09/05/19 2038

## 2019-09-06 ENCOUNTER — Observation Stay (HOSPITAL_COMMUNITY): Payer: Medicare Other

## 2019-09-06 DIAGNOSIS — I4581 Long QT syndrome: Secondary | ICD-10-CM | POA: Diagnosis present

## 2019-09-06 DIAGNOSIS — I129 Hypertensive chronic kidney disease with stage 1 through stage 4 chronic kidney disease, or unspecified chronic kidney disease: Secondary | ICD-10-CM | POA: Diagnosis present

## 2019-09-06 DIAGNOSIS — R55 Syncope and collapse: Secondary | ICD-10-CM

## 2019-09-06 DIAGNOSIS — I25118 Atherosclerotic heart disease of native coronary artery with other forms of angina pectoris: Secondary | ICD-10-CM | POA: Diagnosis not present

## 2019-09-06 DIAGNOSIS — N184 Chronic kidney disease, stage 4 (severe): Secondary | ICD-10-CM | POA: Diagnosis present

## 2019-09-06 DIAGNOSIS — E8809 Other disorders of plasma-protein metabolism, not elsewhere classified: Secondary | ICD-10-CM | POA: Diagnosis present

## 2019-09-06 DIAGNOSIS — I2510 Atherosclerotic heart disease of native coronary artery without angina pectoris: Secondary | ICD-10-CM | POA: Diagnosis present

## 2019-09-06 DIAGNOSIS — Z951 Presence of aortocoronary bypass graft: Secondary | ICD-10-CM | POA: Diagnosis not present

## 2019-09-06 DIAGNOSIS — Z87891 Personal history of nicotine dependence: Secondary | ICD-10-CM | POA: Diagnosis not present

## 2019-09-06 DIAGNOSIS — Z833 Family history of diabetes mellitus: Secondary | ICD-10-CM | POA: Diagnosis not present

## 2019-09-06 DIAGNOSIS — K219 Gastro-esophageal reflux disease without esophagitis: Secondary | ICD-10-CM | POA: Diagnosis present

## 2019-09-06 DIAGNOSIS — Z87440 Personal history of urinary (tract) infections: Secondary | ICD-10-CM | POA: Diagnosis not present

## 2019-09-06 DIAGNOSIS — J8410 Pulmonary fibrosis, unspecified: Secondary | ICD-10-CM | POA: Diagnosis present

## 2019-09-06 DIAGNOSIS — I472 Ventricular tachycardia: Secondary | ICD-10-CM | POA: Diagnosis present

## 2019-09-06 DIAGNOSIS — E876 Hypokalemia: Secondary | ICD-10-CM | POA: Diagnosis present

## 2019-09-06 DIAGNOSIS — Z79899 Other long term (current) drug therapy: Secondary | ICD-10-CM | POA: Diagnosis not present

## 2019-09-06 DIAGNOSIS — Z20822 Contact with and (suspected) exposure to covid-19: Secondary | ICD-10-CM | POA: Diagnosis present

## 2019-09-06 DIAGNOSIS — I951 Orthostatic hypotension: Secondary | ICD-10-CM | POA: Diagnosis present

## 2019-09-06 DIAGNOSIS — E785 Hyperlipidemia, unspecified: Secondary | ICD-10-CM | POA: Diagnosis present

## 2019-09-06 DIAGNOSIS — N179 Acute kidney failure, unspecified: Secondary | ICD-10-CM | POA: Diagnosis present

## 2019-09-06 DIAGNOSIS — M1990 Unspecified osteoarthritis, unspecified site: Secondary | ICD-10-CM | POA: Diagnosis present

## 2019-09-06 DIAGNOSIS — Z8249 Family history of ischemic heart disease and other diseases of the circulatory system: Secondary | ICD-10-CM | POA: Diagnosis not present

## 2019-09-06 DIAGNOSIS — F259 Schizoaffective disorder, unspecified: Secondary | ICD-10-CM | POA: Diagnosis present

## 2019-09-06 DIAGNOSIS — Z7982 Long term (current) use of aspirin: Secondary | ICD-10-CM | POA: Diagnosis not present

## 2019-09-06 LAB — BASIC METABOLIC PANEL
Anion gap: 9 (ref 5–15)
BUN: 28 mg/dL — ABNORMAL HIGH (ref 8–23)
CO2: 23 mmol/L (ref 22–32)
Calcium: 6.3 mg/dL — CL (ref 8.9–10.3)
Chloride: 108 mmol/L (ref 98–111)
Creatinine, Ser: 2.71 mg/dL — ABNORMAL HIGH (ref 0.61–1.24)
GFR calc Af Amer: 25 mL/min — ABNORMAL LOW (ref >60)
GFR calc non Af Amer: 22 mL/min — ABNORMAL LOW (ref >60)
Glucose, Bld: 156 mg/dL — ABNORMAL HIGH (ref 70–99)
Potassium: 4 mmol/L (ref 3.5–5.1)
Sodium: 140 mmol/L (ref 135–145)

## 2019-09-06 LAB — COMPREHENSIVE METABOLIC PANEL
ALT: 16 U/L (ref 0–44)
AST: 22 U/L (ref 15–41)
Albumin: 2.9 g/dL — ABNORMAL LOW (ref 3.5–5.0)
Alkaline Phosphatase: 74 U/L (ref 38–126)
Anion gap: 13 (ref 5–15)
BUN: 29 mg/dL — ABNORMAL HIGH (ref 8–23)
CO2: 20 mmol/L — ABNORMAL LOW (ref 22–32)
Calcium: 5.5 mg/dL — CL (ref 8.9–10.3)
Chloride: 107 mmol/L (ref 98–111)
Creatinine, Ser: 2.46 mg/dL — ABNORMAL HIGH (ref 0.61–1.24)
GFR calc Af Amer: 28 mL/min — ABNORMAL LOW (ref >60)
GFR calc non Af Amer: 24 mL/min — ABNORMAL LOW (ref >60)
Glucose, Bld: 99 mg/dL (ref 70–99)
Potassium: 2.9 mmol/L — ABNORMAL LOW (ref 3.5–5.1)
Sodium: 140 mmol/L (ref 135–145)
Total Bilirubin: 0.6 mg/dL (ref 0.3–1.2)
Total Protein: 5.9 g/dL — ABNORMAL LOW (ref 6.5–8.1)

## 2019-09-06 LAB — SARS CORONAVIRUS 2 (TAT 6-24 HRS): SARS Coronavirus 2: NEGATIVE

## 2019-09-06 LAB — CBC
HCT: 26.4 % — ABNORMAL LOW (ref 39.0–52.0)
Hemoglobin: 8.5 g/dL — ABNORMAL LOW (ref 13.0–17.0)
MCH: 29.9 pg (ref 26.0–34.0)
MCHC: 32.2 g/dL (ref 30.0–36.0)
MCV: 93 fL (ref 80.0–100.0)
Platelets: 176 10*3/uL (ref 150–400)
RBC: 2.84 MIL/uL — ABNORMAL LOW (ref 4.22–5.81)
RDW: 13 % (ref 11.5–15.5)
WBC: 6.8 10*3/uL (ref 4.0–10.5)
nRBC: 0 % (ref 0.0–0.2)

## 2019-09-06 LAB — MAGNESIUM
Magnesium: 1.2 mg/dL — ABNORMAL LOW (ref 1.7–2.4)
Magnesium: 2 mg/dL (ref 1.7–2.4)

## 2019-09-06 LAB — URINALYSIS, ROUTINE W REFLEX MICROSCOPIC
Bilirubin Urine: NEGATIVE
Glucose, UA: NEGATIVE mg/dL
Hgb urine dipstick: NEGATIVE
Ketones, ur: NEGATIVE mg/dL
Leukocytes,Ua: NEGATIVE
Nitrite: NEGATIVE
Protein, ur: NEGATIVE mg/dL
Specific Gravity, Urine: 1.009 (ref 1.005–1.030)
pH: 6 (ref 5.0–8.0)

## 2019-09-06 LAB — ECHOCARDIOGRAM COMPLETE
Height: 68 in
Weight: 2536.17 oz

## 2019-09-06 LAB — GLUCOSE, CAPILLARY: Glucose-Capillary: 92 mg/dL (ref 70–99)

## 2019-09-06 LAB — VITAMIN D 25 HYDROXY (VIT D DEFICIENCY, FRACTURES): Vit D, 25-Hydroxy: 32.14 ng/mL (ref 30–100)

## 2019-09-06 MED ORDER — CALCIUM GLUCONATE-NACL 2-0.675 GM/100ML-% IV SOLN
2.0000 g | Freq: Once | INTRAVENOUS | Status: AC
  Administered 2019-09-06: 2000 mg via INTRAVENOUS
  Filled 2019-09-06: qty 100

## 2019-09-06 MED ORDER — MAGNESIUM SULFATE 2 GM/50ML IV SOLN
2.0000 g | Freq: Once | INTRAVENOUS | Status: AC
  Administered 2019-09-06: 2 g via INTRAVENOUS
  Filled 2019-09-06: qty 50

## 2019-09-06 MED ORDER — POTASSIUM CHLORIDE IN NACL 40-0.9 MEQ/L-% IV SOLN
INTRAVENOUS | Status: DC
  Administered 2019-09-06 – 2019-09-07 (×2): 100 mL/h via INTRAVENOUS
  Filled 2019-09-06 (×4): qty 1000

## 2019-09-06 NOTE — Progress Notes (Signed)
Rx Brief note: Lovenox  Wt = 71 kg, CrCl~22 ml/min  Rx adjusted Lovenox to 30 mg daily in pt with CrCl<30 ml/min  Thanks Dorrene German 09/06/2019 2:37 AM

## 2019-09-06 NOTE — Progress Notes (Signed)
PROGRESS NOTE  Robert Little  YNW:[id number] DOB: 1940-12-26 DOA: 09/05/2019 PCP: Patient, No Pcp Per   Brief Narrative: Robert Little is a 79 y.o. male with medical history significant of CAD s/p CABG, HTN, schizophrenia.  Pt presents to ED after near syncope episode.  Lives in group home.  His caretaker was in the bath and said they he asked the patient if he was feeling okay and the patient said he felt great. He then started to stand up and he got fatigued and felt like he had to sit back down. Few minutes later he had a near syncopal event. The caregiver said that he had his eyes open but was not responding. He was breathing normally but at 1 point he started to lose his color and the caregiver gave him some intermittent mouth-to-mouth breaths. He does say that the patient was breathing normally however during these times. Shortly after that he, the patient became more responsive and by the time EMS arrived, he was back to his baseline mental status. EMS noted that his blood pressure on scene was 88/60 although following that it was back in the normal range.  Patient did not fall or injure himself. Currently he says he feels fine and denies any complaints. The caregiver says that he does go through stages where he does not eat well for whatever reason. He is currently in one of those stages now and has not really been eating much recently.  Patient states he did have an episode of chest pain earlier this morning on his left side. He says it only lasted a short time and has not had any further episodes. He says that he has the same type pain intermittently. He denies any shortness of breath. No recent illnesses. No fevers, cough or congestion.   ED Course: Trop neg.  Creat 2.65 (1.77 in 2019), BUN 26, Calcium 4.5, Mg 0.7.  QTc 515 on EKG, no acute ischemic changes.  UA pending  CXR without any acute changes.  HGB 9.6 (9.4 in 2019)  K 3.2   Assessment &  Plan: Principal Problem:   Syncope Active Problems:   Coronary artery disease   Schizo affective schizophrenia ([place])   Acute kidney injury superimposed on CKD (HCC)   Hypocalcemia   Hypomagnesemia  Syncope:  - Continue telemetry - Echocardiogram was ordered.  - Check orthostatics once volume replete. Repeat orthostatic vitals this morning confirm ongoing orthostatic hypotension (128/69 >> 105/54)  AKI on stage IIIa CKD vs. progressive, now stage IV CKD: CrCl <49ml/min despite volume resuscitation. No proteinuria or hematuria on UA.  - Continue IV fluids with ongoing poor per oral intake.  - Avoid nephrotoxins - Continue flomax, check PVR.   Schizoaffective disorder/schizophrenia: Resides in group home.  - Due for home invega depot today though use is contraindicated with current level of renal dysfunction. Will continue to trend Cr and either restart once CrCl improves or d/w psychiatry regarding alternatives, continue benztropine.  - Continue clonazepam  Hypokalemia: Improved with supplementation after initial worsening.  - Continue to follow closely. Has shown improvement now that magnesium normalized.   Hypomagnesemia:  - Supplemented further this AM, will continue monitoring.   Hypocalcemia: Improving with aggressive replacement. Remains low even when corrected for hypoalbuminemia. No tetany on exam.  - Continue supplementation, check in AM.  CAD s/p CABG, HTN: No chest pain. Troponin wnl x2.  - Cardiac work up for syncope as above - Continue ASA, statin, atenolol   Anemia of chronic disease:  -  Continue monitoring, seeing some hemodilution currently.  GERD:  - Continue PPI  DVT prophylaxis: Lovenox 30mg  q24h Code Status: Full Family Communication: None at bedside Disposition Plan: Severe electrolyte derangements as well as renal failure continue and will require continued supplementation and monitoring with continuous IV fluids.   Consultants:    None  Procedures:   None  Antimicrobials:  None   Subjective: Hasn't eaten much, denies current nausea or vomiting. Making the normal amount of urine. Having some persecutory delusions.   Objective: Vitals:   09/05/19 2335 09/06/19 0230 09/06/19 0550 09/06/19 0957  BP:   128/69 132/65  Pulse: 73  75 82  Resp: 17  18 18   Temp: 97.6 F (36.4 C)  97.8 F (36.6 C) 98.5 F (36.9 C)  TempSrc:   Oral Oral  SpO2: 96%  95% 97%  Weight:  71.9 kg    Height:  5\' 8"  (1.727 m)      Intake/Output Summary (Last 24 hours) at 09/06/2019 1151 Last data filed at 09/06/2019 1025 Gross per 24 hour  Intake 1039.46 ml  Output 975 ml  Net 64.46 ml   Filed Weights   09/06/19 0230  Weight: 71.9 kg    Gen: 79 y.o. male in no distress, elderly Pulm: Non-labored breathing. Clear to auscultation bilaterally.  CV: Regular rate and rhythm. No murmur, rub, or gallop. No JVD, no pitting pedal edema. GI: Abdomen soft, non-tender, non-distended, with normoactive bowel sounds. No organomegaly or masses felt. Ext: Warm, no deformities Skin: No rashes, lesions or ulcers Neuro: Alert and mostly oriented. No focal neurological deficits. Psych: Judgement and insight appear impaired, endorsing some delusions, tangentiality without grandiosity, he is redirectable and behavior remains appropriate.   Data Reviewed: I have personally reviewed following labs and imaging studies  CBC: Recent Labs  Lab 09/05/19 1817 09/06/19 0545  WBC 7.2 6.8  NEUTROABS 5.2  --   HGB 9.6* 8.5*  HCT 29.5* 26.4*  MCV 94.6 93.0  PLT 154 485   Basic Metabolic Panel: Recent Labs  Lab 09/05/19 1817 09/05/19 2156 09/06/19 0545  NA 140  --  140  K 3.2*  --  2.9*  CL 107  --  107  CO2 21*  --  20*  GLUCOSE 113*  --  99  BUN 26*  --  29*  CREATININE 2.65*  --  2.46*  CALCIUM 4.5*  --  5.5*  MG 0.7*  --  1.2*  PHOS  --  3.2  --    GFR: Estimated Creatinine Clearance: 24.3 mL/min (A) (by C-G formula based on SCr  of 2.46 mg/dL (H)). Liver Function Tests: Recent Labs  Lab 09/05/19 1817 09/06/19 0545  AST 30 22  ALT 13 16  ALKPHOS 81 74  BILITOT 0.8 0.6  PROT 6.3* 5.9*  ALBUMIN 2.9* 2.9*   No results for input(s): LIPASE, AMYLASE in the last 168 hours. No results for input(s): AMMONIA in the last 168 hours. Coagulation Profile: No results for input(s): INR, PROTIME in the last 168 hours. Cardiac Enzymes: No results for input(s): CKTOTAL, CKMB, CKMBINDEX, TROPONINI in the last 168 hours. BNP (last 3 results) No results for input(s): PROBNP in the last 8760 hours. HbA1C: No results for input(s): HGBA1C in the last 72 hours. CBG: Recent Labs  Lab 09/06/19 0551  GLUCAP 92   Lipid Profile: No results for input(s): CHOL, HDL, LDLCALC, TRIG, CHOLHDL, LDLDIRECT in the last 72 hours. Thyroid Function Tests: Recent Labs    09/05/19 2156  TSH  4.328   Anemia Panel: No results for input(s): VITAMINB12, FOLATE, FERRITIN, TIBC, IRON, RETICCTPCT in the last 72 hours. Urine analysis:    Component Value Date/Time   COLORURINE YELLOW 09/05/2019 1819   APPEARANCEUR CLEAR 09/05/2019 1819   LABSPEC 1.009 09/05/2019 1819   PHURINE 6.0 09/05/2019 1819   GLUCOSEU NEGATIVE 09/05/2019 1819   HGBUR NEGATIVE 09/05/2019 1819   BILIRUBINUR NEGATIVE 09/05/2019 1819   KETONESUR NEGATIVE 09/05/2019 1819   PROTEINUR NEGATIVE 09/05/2019 1819   NITRITE NEGATIVE 09/05/2019 1819   LEUKOCYTESUR NEGATIVE 09/05/2019 1819   Recent Results (from the past 240 hour(s))  SARS CORONAVIRUS 2 (TAT 6-24 HRS) Nasopharyngeal Nasopharyngeal Swab     Status: None   Collection Time: 09/05/19  9:23 PM   Specimen: Nasopharyngeal Swab  Result Value Ref Range Status   SARS Coronavirus 2 NEGATIVE NEGATIVE Final    Comment: (NOTE) SARS-CoV-2 target nucleic acids are NOT DETECTED. The SARS-CoV-2 RNA is generally detectable in upper and lower respiratory specimens during the acute phase of infection. Negative results do not  preclude SARS-CoV-2 infection, do not rule out co-infections with other pathogens, and should not be used as the sole basis for treatment or other patient management decisions. Negative results must be combined with clinical observations, patient history, and epidemiological information. The expected result is Negative. Fact Sheet for Patients: SugarRoll.be Fact Sheet for Healthcare Providers: https://www.woods-mathews.com/ This test is not yet approved or cleared by the [place] FDA and  has been authorized for detection and/or diagnosis of SARS-CoV-2 by FDA under an Emergency Use Authorization (EUA). This EUA will remain  in effect (meaning this test can be used) for the duration of the COVID-19 declaration under Section 56 4(b)(1) of the Act, 21 U.S.C. section 360bbb-3(b)(1), unless the authorization is terminated or revoked sooner. Performed at [facility], [employer] [address] [place], [place] [address]       Radiology Studies: X-ray chest PA and lateral  Result Date: 09/05/2019 CLINICAL DATA:  Syncope EXAM: CHEST - 2 VIEW COMPARISON:  05/15/2018 FINDINGS: Increased density at the left lung base superimposed on chronic opacity. Chronic mild interstitial prominence. No significant pleural effusion. No pneumothorax. Stable cardiomediastinal contours. No acute osseous abnormality. IMPRESSION: Left basilar atelectasis/consolidation superimposed on bibasilar fibrotic changes. Electronically Signed   By: Macy Mis M.D.   On: 09/05/2019 21:04    Scheduled Meds: . aspirin  325 mg Oral Daily  . atenolol  25 mg Oral Daily  . atorvastatin  20 mg Oral QHS  . benztropine  0.5 mg Oral BID  . clonazePAM  0.5 mg Oral BID  . enoxaparin (LOVENOX) injection  30 mg Subcutaneous Daily  . pantoprazole  40 mg Oral Daily  . sodium chloride flush  3 mL Intravenous Q12H  . tamsulosin  0.4 mg Oral Daily   Continuous Infusions: . 0.9 % NaCl with KCl  40 mEq / L 100 mL/hr (09/06/19 0934)     LOS: 0 days   Time spent: 25 minutes.  Patrecia Pour, MD [facility] [email or web address] 09/06/2019, 11:51 AM

## 2019-09-06 NOTE — TOC Initial Note (Signed)
Transition of Care [facility]) - Initial/Assessment Note    Patient Details  Name: Robert Little MRN: [id number] Date of Birth: 1940-12-15  Transition of Care [facility]) CM/SW Contact:    Trish Mage, LCSW Phone Number: 09/06/2019, 3:30 PM  Clinical Narrative:  Resident of [facility] where he has been a resident for a number of years will return there at d/c.Here due to syncopal episode  See contact information below. TOC will continue to follow during the course of hospitalization.                  Expected Discharge Plan: Group Home Barriers to Discharge: No Barriers Identified   Patient Goals and CMS Choice        Expected Discharge Plan and Services Expected Discharge Plan: Group Home   Discharge Planning Services: CM Consult   Living arrangements for the past 2 months: Group Home                                      Prior Living Arrangements/Services Living arrangements for the past 2 months: Group Home Lives with:: Facility Resident Patient language and need for interpreter reviewed:: Yes Do you feel safe going back to the place where you live?: Yes      Need for Family Participation in Patient Care: No (Comment) Care giver support system in place?: Yes (comment)   Criminal Activity/Legal Involvement Pertinent to Current Situation/Hospitalization: No - Comment as needed  Activities of Daily Living Home Assistive Devices/Equipment: Cane (specify quad or straight) ADL Screening (condition at time of admission) Patient's cognitive ability adequate to safely complete daily activities?: Yes Is the patient deaf or have difficulty hearing?: Yes Does the patient have difficulty seeing, even when wearing glasses/contacts?: No Does the patient have difficulty concentrating, remembering, or making decisions?: No Patient able to express need for assistance with ADLs?: Yes Does the patient have difficulty dressing or bathing?: Yes Independently performs ADLs?:  No Communication: Independent Dressing (OT): Needs assistance Is this a change from baseline?: Pre-admission baseline Grooming: Needs assistance Is this a change from baseline?: Pre-admission baseline Feeding: Independent Bathing: Needs assistance Is this a change from baseline?: Pre-admission baseline Toileting: Needs assistance Is this a change from baseline?: Pre-admission baseline In/Out Bed: Needs assistance Is this a change from baseline?: Pre-admission baseline Walks in Home: Needs assistance Is this a change from baseline?: Pre-admission baseline Does the patient have difficulty walking or climbing stairs?: Yes Weakness of Legs: Both Weakness of Arms/Hands: None  Permission Sought/Granted Permission sought to share information with : Facility Art therapist granted to share information with : Yes, Verbal Permission Granted  Share Information with NAME: Efraim Kaufmann     Permission granted to share info w Relationship: owner of [employer] granted to share info w Contact Information: [phone]  Emotional Assessment Appearance:: Appears stated age Attitude/Demeanor/Rapport: Engaged Affect (typically observed): Appropriate Orientation: : Oriented to Self, Oriented to Place, Oriented to Situation Alcohol / Substance Use: Not Applicable Psych Involvement: Yes (comment)(Monthly anti-psychotic injectable)  Admission diagnosis:  Hypocalcemia [E83.51] Hypomagnesemia [E83.42] Syncope [R55] Near syncope [R55] Long QT interval [R94.31] Patient Active Problem List   Diagnosis Date Noted  . Syncope 09/05/2019  . Hypocalcemia 09/05/2019  . Hypomagnesemia 09/05/2019  . Acute urinary retention 05/18/2018  . UTI (urinary tract infection) 05/15/2018  . Sepsis ([place]) 05/15/2018  . Hypokalemia 05/15/2018  . Acute kidney injury superimposed on  CKD ([place]) 05/15/2018  . HLD (hyperlipidemia) 05/15/2018  . Coronary artery disease   .  Hypertension   . Pulmonary fibrosis ([place])   . Schizo affective schizophrenia ([place])   . Anemia    PCP:  Patient, No Pcp Per Pharmacy:   [id number] [address], [place] [address] Phone: [phone] Fax: [phone]     Social Determinants of Health (SDOH) Interventions    Readmission Risk Interventions No flowsheet data found.

## 2019-09-06 NOTE — Progress Notes (Signed)
Patient has a low potassium level of 2.9, calcium level of 5.5, and magnesium level of 1.2. Dr. Bonner Puna notified of low electrolyte levels. Awaiting for new orders.

## 2019-09-06 NOTE — Progress Notes (Signed)
CRITICAL VALUE ALERT  Critical Value:  Ca 5.5  Date & Time Notied:  09/06/19. 6834  Provider Notified: Baltazar Najjar  Orders Received/Actions taken:  No response as of 0804. Report given to day shift RN at 0710 and informed of lab and awaiting response.

## 2019-09-06 NOTE — Progress Notes (Signed)
  Echocardiogram 2D Echocardiogram has been performed.  Jennette Dubin 09/06/2019, 12:15 PM

## 2019-09-06 NOTE — Progress Notes (Signed)
CRITICAL VALUE ALERT  Critical Value:  Ca 6.3  Date & Time Notied:  09/06/19 at 1631  Provider Notified: Bonner Puna, MD  Orders Received/Actions taken:

## 2019-09-07 LAB — COMPREHENSIVE METABOLIC PANEL
ALT: 18 U/L (ref 0–44)
AST: 23 U/L (ref 15–41)
Albumin: 2.5 g/dL — ABNORMAL LOW (ref 3.5–5.0)
Alkaline Phosphatase: 69 U/L (ref 38–126)
Anion gap: 7 (ref 5–15)
BUN: 31 mg/dL — ABNORMAL HIGH (ref 8–23)
CO2: 20 mmol/L — ABNORMAL LOW (ref 22–32)
Calcium: 6.5 mg/dL — ABNORMAL LOW (ref 8.9–10.3)
Chloride: 111 mmol/L (ref 98–111)
Creatinine, Ser: 2.64 mg/dL — ABNORMAL HIGH (ref 0.61–1.24)
GFR calc Af Amer: 26 mL/min — ABNORMAL LOW (ref >60)
GFR calc non Af Amer: 22 mL/min — ABNORMAL LOW (ref >60)
Glucose, Bld: 121 mg/dL — ABNORMAL HIGH (ref 70–99)
Potassium: 4.2 mmol/L (ref 3.5–5.1)
Sodium: 138 mmol/L (ref 135–145)
Total Bilirubin: 0.7 mg/dL (ref 0.3–1.2)
Total Protein: 5.4 g/dL — ABNORMAL LOW (ref 6.5–8.1)

## 2019-09-07 LAB — CBC
HCT: 25.4 % — ABNORMAL LOW (ref 39.0–52.0)
Hemoglobin: 8 g/dL — ABNORMAL LOW (ref 13.0–17.0)
MCH: 30 pg (ref 26.0–34.0)
MCHC: 31.5 g/dL (ref 30.0–36.0)
MCV: 95.1 fL (ref 80.0–100.0)
Platelets: 166 10*3/uL (ref 150–400)
RBC: 2.67 MIL/uL — ABNORMAL LOW (ref 4.22–5.81)
RDW: 13.2 % (ref 11.5–15.5)
WBC: 5.2 10*3/uL (ref 4.0–10.5)
nRBC: 0 % (ref 0.0–0.2)

## 2019-09-07 LAB — CALCITRIOL (1,25 DI-OH VIT D): Vit D, 1,25-Dihydroxy: 30.3 pg/mL (ref 19.9–79.3)

## 2019-09-07 LAB — GLUCOSE, CAPILLARY: Glucose-Capillary: 128 mg/dL — ABNORMAL HIGH (ref 70–99)

## 2019-09-07 MED ORDER — CALCIUM GLUCONATE 500 MG PO TABS: 1.0000 | ORAL_TABLET | Freq: Two times a day (BID) | ORAL | 0 refills | Status: DC

## 2019-09-07 NOTE — Progress Notes (Signed)
Pt discharged home with caregiver Legrand Como. Discharge instructions given. Script sent to pharmacy of choice. No immediate questions or concerns at this time. Pt discharged from unit via wheelchair.

## 2019-09-07 NOTE — Discharge Summary (Addendum)
Physician Discharge Summary  Robert Little QAS:[id number] DOB: 1941/01/06 DOA: 09/05/2019  PCP: Patient, No Pcp Per  Admit date: 09/05/2019 Discharge date: 09/07/2019  Admitted From: Group home Disposition: Group home   Recommendations for Outpatient Follow-up:  1. Follow up with PCP in 1-2 weeks, needs recheck of renal function, magnesium, calcium, and blood counts. 2. Due to renal impairment, invega depot treatment is no longer recommended. Recommend follow up with the patient's psychiatrist as soon as possible. .   Home Health: None Equipment/Devices: Rolling walker at home Discharge Condition: Stable CODE STATUS: Full Diet recommendation: Heart healthy  Brief/Interim Summary: Robert Little a 79 y.o.malewith medical history significant ofCAD s/p CABG, HTN, schizophrenia.  Pt presents to ED after near syncope episode. Lives in group home.  His caretaker was in the bath and said they he asked the patient if he was feeling okay and the patient said he felt great. He then started to stand up and he got fatigued and felt like he had to sit back down. Few minutes later he had a near syncopal event. The caregiver said that he had his eyes open but was not responding. He was breathing normally but at 1 point he started to lose his color and the caregiver gave him some intermittent mouth-to-mouth breaths. He does say that the patient was breathing normally however during these times. Shortly after that he, the patient became more responsive and by the time EMS arrived, he was back to his baseline mental status. EMS noted that his blood pressure on scene was 88/60 although following that it was back in the normal range.  Patient did not fall or injure himself. Currently he says he feels fine and denies any complaints. The caregiver says that he does go through stages where he does not eat well for whatever reason. He is currentlyin oneof those stages now and has not really been  eating much recently.  Patient states he did have an episode of chest pain earlier this morning on his left side. He says it only lasted a short time and has not had any further episodes. He says that he has the same type pain intermittently. He denies any shortness of breath. No recent illnesses. No fevers, cough or congestion.   ED Course:Trop neg. Creat 2.65 (1.77 in 2019), BUN 26, Calcium 4.5, Mg 0.7.  QTc 515 on EKG, no acute ischemic changes.  UA pending  CXR without any acute changes.  HGB 9.6 (9.4 in 2019)  K 3.2  Hospital course: IV fluids and electrolyte supplementation was given with steady improvement. Over the 24 hours preceding discharge, the patient's per oral intake has been very good and orthostatic symptoms have resolved. Renal function has not dramatically improved, but remained stable.   Discharge Diagnoses:  Principal Problem:   Syncope Active Problems:   Coronary artery disease   Schizo affective schizophrenia ([place])   Acute kidney injury superimposed on CKD (HCC)   Hypocalcemia   Hypomagnesemia  Syncope: Very short run of NSVT once during admission not coincident with lightheadedness which has improved with volume supplementation. Echocardiogram report below, showing G2DD though doesn't have clinical heart failure.   AKI on stage IIIa CKD vs. progressive, now stage IV CKD: CrCl <32ml/min despite volume resuscitation. No proteinuria or hematuria on UA.  - Continued IV fluids though appears volume replete with modest improvement in renal function. Will need follow up.  - Avoid nephrotoxins - Continue flomax, not retaining urine on check here. - To avoid hypotension, will  hold lisinopril at discharge. Has not been given during admission and BPs have remained low-normal.   Schizoaffective disorder/schizophrenia: Resides in group home.  - Due for home invega depot though use is contraindicated with current level of renal dysfunction. Will  continue to trend Cr at follow up with hopes for continued renal recovery (showing some improvement on day of discharge) and either restart once CrCl improves or d/w psychiatry regarding alternatives, continue benztropine.  - Continue clonazepam  Hypokalemia: Improved with supplementation after initial worsening.  - Anticipate continued improvement independent of supplementation with picking up per oral intake.  Hypomagnesemia: Resolved with replacement.  Hypocalcemia: Improving with aggressive replacement. Remains low even when corrected for hypoalbuminemia. No tetany on exam. Vitamin D is 32.14.  - Continue supplementation after discharge, recheck at follow up.  CAD s/p CABG, HTN: No chest pain. Troponin wnl x2.  - Telemetry showed NSVT x4 beats only. Recommend outpatient follow up only. - Continue ASA, statin, atenolol   Anemia of chronic disease:  - Continue monitoring, seeing some hemodilution currently. No bleeding.  GERD:  - Continue PPI  Discharge Instructions Discharge Instructions    Diet - low sodium heart healthy   Complete by: As directed    Discharge instructions   Complete by: As directed    Follow up with your psychiatrist as soon as possible. Due to impaired kidney function, your usual invega dosing is not currently recommended. If kidney function improves, this may be restarted, but otherwise, an alternative would be necessary.   Follow up with your primary doctor as soon as possible as well to have labs rechecked including potassium, magnesium, calcium, kidney function and blood counts. It is recommended that you take a calcium supplement daily.   For now, stop taking lisinopril in order to avoid low blood pressure. Again, you will need to follow up with your doctor as soon as possible to have vital signs rechecked.  The cause of symptoms was likely not eating/drinking enough. These have improved with IV fluids, though you're taking adequate intake by mouth  to be discharged. If you develop shortness of breath, chest pain, any bleeding, or faintness, you need to seek medical advice right away.   Increase activity slowly   Complete by: As directed      Allergies as of 09/07/2019   No Known Allergies     Medication List    STOP taking these medications   Invega Sustenna 156 MG/ML Susy injection Generic drug: paliperidone   lisinopril 10 MG tablet Commonly known as: ZESTRIL     TAKE these medications   acetaminophen 325 MG tablet Commonly known as: TYLENOL Take 2 tablets (650 mg total) by mouth every 6 (six) hours as needed for mild pain (or Fever >/= 101).   aspirin 325 MG tablet Take 325 mg by mouth daily.   atenolol 25 MG tablet Commonly known as: TENORMIN Take 25 mg by mouth daily.   atorvastatin 20 MG tablet Commonly known as: LIPITOR Take 20 mg by mouth at bedtime.   benztropine 0.5 MG tablet Commonly known as: COGENTIN Take 0.5 mg by mouth 2 (two) times daily.   calcium gluconate 500 MG tablet Take 1 tablet (500 mg total) by mouth 2 (two) times daily.   clonazePAM 0.5 MG tablet Commonly known as: KLONOPIN Take 0.5 mg by mouth 2 (two) times daily.   [employer] SPECTRAVITE PO Take 1 tablet by mouth daily.   Dexilant 30 MG capsule Generic drug: Dexlansoprazole Take 30 mg by mouth  daily.   tamsulosin 0.4 MG Caps capsule Commonly known as: FLOMAX Take 1 capsule (0.4 mg total) by mouth daily after supper. What changed: when to take this      [place], [facility] Follow up.   Why: Make a follow  up appointment ASAP Contact information: [address] [place] [address] [place] Follow up.   Contact information: [place] [phone]         No Known Allergies  Consultations:  None  Procedures/Studies: X-ray chest PA and lateral  Result Date: 09/05/2019 CLINICAL DATA:  Syncope EXAM:  CHEST - 2 VIEW COMPARISON:  05/15/2018 FINDINGS: Increased density at the left lung base superimposed on chronic opacity. Chronic mild interstitial prominence. No significant pleural effusion. No pneumothorax. Stable cardiomediastinal contours. No acute osseous abnormality. IMPRESSION: Left basilar atelectasis/consolidation superimposed on bibasilar fibrotic changes. Electronically Signed   By: Macy Mis M.D.   On: 09/05/2019 21:04   ECHOCARDIOGRAM COMPLETE  Result Date: 09/06/2019    ECHOCARDIOGRAM REPORT   Patient Name:   BRYLEY KOVACEVIC Date of Exam: 09/06/2019 Medical Rec #:  [id number]    Height:       68.0 in Accession #:    [id number]   Weight:       158.5 lb Date of Birth:  1941/03/03    BSA:          1.851 m Patient Age:    54 years     BP:           132/65 mmHg Patient Gender: M            HR:           67 bpm. Exam Location:  Inpatient Procedure: 2D Echo Indications:    Syncope R55  History:        Patient has prior history of Echocardiogram examinations and                 Patient has no prior history of Echocardiogram examinations.                 CAD, Prior CABG; Risk Factors:Hypertension.  Sonographer:    Mikki Santee RDCS (AE) Referring Phys: [place]  1. Left ventricular ejection fraction, by estimation, is 60 to 65%. The left ventricle has normal function. The left ventricle has no regional wall motion abnormalities. There is mild concentric left ventricular hypertrophy. Left ventricular diastolic parameters are consistent with Grade II diastolic dysfunction (pseudonormalization). Elevated left atrial pressure.  2. Right ventricular systolic function is normal. The right ventricular size is normal. Tricuspid regurgitation signal is inadequate for assessing PA pressure.  3. Left atrial size was moderately dilated.  4. The mitral valve is normal in structure and function. Trivial mitral valve regurgitation. No evidence of mitral stenosis.  5. The aortic valve is normal  in structure and function. Aortic valve regurgitation is not visualized. Mild aortic valve sclerosis is present, with no evidence of aortic valve stenosis.  6. The inferior vena cava is dilated in size with >50% respiratory variability, suggesting right atrial pressure of 8 mmHg. FINDINGS  Left Ventricle: Left ventricular ejection fraction, by estimation, is 60 to 65%. The left ventricle has normal function. The left ventricle has no regional wall motion abnormalities. The left ventricular internal cavity size was normal in size. There is  mild concentric left  ventricular hypertrophy. Left ventricular diastolic parameters are consistent with Grade II diastolic dysfunction (pseudonormalization). Elevated left atrial pressure. Right Ventricle: The right ventricular size is normal. No increase in right ventricular wall thickness. Right ventricular systolic function is normal. Tricuspid regurgitation signal is inadequate for assessing PA pressure. Left Atrium: Left atrial size was moderately dilated. Right Atrium: Right atrial size was normal in size. Pericardium: There is no evidence of pericardial effusion. Mitral Valve: The mitral valve is normal in structure and function. Normal mobility of the mitral valve leaflets. Mild mitral annular calcification. Trivial mitral valve regurgitation. No evidence of mitral valve stenosis. Tricuspid Valve: The tricuspid valve is normal in structure. Tricuspid valve regurgitation is not demonstrated. No evidence of tricuspid stenosis. Aortic Valve: The aortic valve is normal in structure and function. Aortic valve regurgitation is not visualized. Mild aortic valve sclerosis is present, with no evidence of aortic valve stenosis. Pulmonic Valve: The pulmonic valve was normal in structure. Pulmonic valve regurgitation is trivial. No evidence of pulmonic stenosis. Aorta: The aortic root is normal in size and structure. Venous: The inferior vena cava is dilated in size with greater than  50% respiratory variability, suggesting right atrial pressure of 8 mmHg. IAS/Shunts: No atrial level shunt detected by color flow Doppler.  LEFT VENTRICLE PLAX 2D LVIDd:         4.50 cm  Diastology LVIDs:         3.10 cm  LV e' lateral:   8.70 cm/s LV PW:         1.20 cm  LV E/e' lateral: 11.5 LV IVS:        1.30 cm  LV e' medial:    5.87 cm/s LVOT diam:     1.95 cm  LV E/e' medial:  17.0 LV SV:         51 LV SV Index:   28 LVOT Area:     2.99 cm  RIGHT VENTRICLE RV S prime:     10.30 cm/s TAPSE (M-mode): 1.7 cm LEFT ATRIUM             Index       RIGHT ATRIUM           Index LA diam:        4.90 cm 2.65 cm/m  RA Area:     15.40 cm LA Vol (A2C):   55.7 ml 30.08 ml/m RA Volume:   30.30 ml  16.37 ml/m LA Vol (A4C):   68.2 ml 36.84 ml/m LA Biplane Vol: 65.2 ml 35.22 ml/m  AORTIC VALVE LVOT Vmax:   76.30 cm/s LVOT Vmean:  53.700 cm/s LVOT VTI:    0.172 m  AORTA Ao Root diam: 3.00 cm MITRAL VALVE MV Area (PHT): 3.89 cm    SHUNTS MV Decel Time: 195 msec    Systemic VTI:  0.17 m MV E velocity: 99.90 cm/s  Systemic Diam: 1.95 cm MV A velocity: 73.50 cm/s MV E/A ratio:  1.36 Mihai Croitoru MD Electronically signed by Sanda Klein MD Signature Date/Time: 09/06/2019/2:51:24 PM    Final      Subjective: Feels well, eating 100% of meals, no swelling in legs. No syncope/lightheadedness reported when working with PT and OT this morning. Group home is reluctant to accept home health and SNF is not recommended.  Discharge Exam: Vitals:   09/07/19 0800 09/07/19 1202  BP:  129/62  Pulse:  63  Resp:  20  Temp: (!) 97.5 F (36.4 C) 97.6 F (36.4 C)  SpO2:  96%   General: Pt is alert, awake, not in acute distress Cardiovascular: RRR, S1/S2 +, no rubs, no gallops Respiratory: CTA bilaterally, no wheezing, no rhonchi Abdominal: Soft, NT, ND, bowel sounds + Extremities: Trace dependent edema, no cyanosis  Labs: Basic Metabolic Panel: Recent Labs  Lab 09/05/19 1817 09/05/19 2156 09/06/19 0545  09/06/19 1411 09/07/19 0600  NA 140  --  140 140 138  K 3.2*  --  2.9* 4.0 4.2  CL 107  --  107 108 111  CO2 21*  --  20* 23 20*  GLUCOSE 113*  --  99 156* 121*  BUN 26*  --  29* 28* 31*  CREATININE 2.65*  --  2.46* 2.71* 2.64*  CALCIUM 4.5* 5.1* 5.5* 6.3* 6.5*  MG 0.7*  --  1.2* 2.0  --   PHOS  --  3.2  --   --   --    Liver Function Tests: Recent Labs  Lab 09/05/19 1817 09/06/19 0545 09/07/19 0600  AST 30 22 23   ALT 13 16 18   ALKPHOS 81 74 69  BILITOT 0.8 0.6 0.7  PROT 6.3* 5.9* 5.4*  ALBUMIN 2.9* 2.9* 2.5*   CBC: Recent Labs  Lab 09/05/19 1817 09/06/19 0545 09/07/19 0600  WBC 7.2 6.8 5.2  NEUTROABS 5.2  --   --   HGB 9.6* 8.5* 8.0*  HCT 29.5* 26.4* 25.4*  MCV 94.6 93.0 95.1  PLT 154 176 166   Thyroid function studies Recent Labs    09/05/19 2156  TSH 4.328   Urinalysis    Component Value Date/Time   COLORURINE YELLOW 09/05/2019 1819   APPEARANCEUR CLEAR 09/05/2019 1819   LABSPEC 1.009 09/05/2019 1819   PHURINE 6.0 09/05/2019 1819   GLUCOSEU NEGATIVE 09/05/2019 1819   HGBUR NEGATIVE 09/05/2019 1819   BILIRUBINUR NEGATIVE 09/05/2019 1819   KETONESUR NEGATIVE 09/05/2019 1819   PROTEINUR NEGATIVE 09/05/2019 1819   NITRITE NEGATIVE 09/05/2019 1819   LEUKOCYTESUR NEGATIVE 09/05/2019 1819    Microbiology Recent Results (from the past 240 hour(s))  SARS CORONAVIRUS 2 (TAT 6-24 HRS) Nasopharyngeal Nasopharyngeal Swab     Status: None   Collection Time: 09/05/19  9:23 PM   Specimen: Nasopharyngeal Swab  Result Value Ref Range Status   SARS Coronavirus 2 NEGATIVE NEGATIVE Final    Comment: (NOTE) SARS-CoV-2 target nucleic acids are NOT DETECTED. The SARS-CoV-2 RNA is generally detectable in upper and lower respiratory specimens during the acute phase of infection. Negative results do not preclude SARS-CoV-2 infection, do not rule out co-infections with other pathogens, and should not be used as the sole basis for treatment or other patient management  decisions. Negative results must be combined with clinical observations, patient history, and epidemiological information. The expected result is Negative. Fact Sheet for Patients: SugarRoll.be Fact Sheet for Healthcare Providers: https://www.woods-mathews.com/ This test is not yet approved or cleared by the [place] FDA and  has been authorized for detection and/or diagnosis of SARS-CoV-2 by FDA under an Emergency Use Authorization (EUA). This EUA will remain  in effect (meaning this test can be used) for the duration of the COVID-19 declaration under Section 56 4(b)(1) of the Act, 21 U.S.C. section 360bbb-3(b)(1), unless the authorization is terminated or revoked sooner. Performed at [facility], [facility] [address], [place], [place] [address]     Time coordinating discharge: Approximately 40 minutes  Patrecia Pour, MD  [facility] 09/07/2019, 12:43 PM

## 2019-09-07 NOTE — Plan of Care (Signed)
  Problem: Clinical Measurements: Goal: Diagnostic test results will improve Outcome: Progressing   Problem: Activity: Goal: Risk for activity intolerance will decrease Outcome: Progressing   

## 2019-09-07 NOTE — Evaluation (Signed)
Physical Therapy Evaluation Patient Details Name: Robert Little MRN: [id number] DOB: Jan 07, 1941 Today's Date: 09/07/2019   History of Present Illness  79 y.o. male with medical history significant of CAD s/p CABG, HTN, schizophrenia. admitted to [facility] after near syncopal episode. resides at group home  Clinical Impression  Patient evaluated by Physical Therapy with no further acute PT needs identified. All education has been completed and the patient has no further questions.  Pt very cooperative with PT, requiring  RW to incr gait stability. Will benefit froM HHPT at group home  See below for any follow-up Physical Therapy or equipment needs. PT is signing off. Thank you for this referral.     Follow Up Recommendations Home health PT    Equipment Recommendations  Rolling walker with 5" wheels    Recommendations for Other Services       Precautions / Restrictions Precautions Precautions: Fall Restrictions Weight Bearing Restrictions: No      Mobility  Bed Mobility               General bed mobility comments: pt sitting on EOB  Transfers Overall transfer level: Needs assistance Equipment used: Rolling walker (2 wheeled) Transfers: Sit to/from Stand Sit to Stand: Min guard         General transfer comment: cues for hand placement and safety  Ambulation/Gait Ambulation/Gait assistance: Min guard Gait Distance (Feet): 200 Feet Assistive device: Rolling walker (2 wheeled) Gait Pattern/deviations: Step-through pattern;Decreased stride length;Drifts right/left     General Gait Details: slight drifting and unsteadiness, min/guard for safety, no overt LOB  Stairs            Wheelchair Mobility    Modified Rankin (Stroke Patients Only)       Balance Overall balance assessment: Needs assistance Sitting-balance support: No upper extremity supported;Feet supported Sitting balance-Leahy Scale: Good       Standing balance-Leahy Scale: Fair Standing  balance comment: reliant on UE support for dynamic balance                             Pertinent Vitals/Pain Pain Assessment: No/denies pain    Home Living Family/patient expects to be discharged to:: Group home                      Prior Function Level of Independence: Needs assistance   Gait / Transfers Assistance Needed: amb with supervision  ADL's / Homemaking Assistance Needed: assist from caregivers per pt        Hand Dominance        Extremity/Trunk Assessment   Upper Extremity Assessment Upper Extremity Assessment: Overall WFL for tasks assessed    Lower Extremity Assessment Lower Extremity Assessment: Generalized weakness       Communication      Cognition Arousal/Alertness: Awake/alert Behavior During Therapy: WFL for tasks assessed/performed Overall Cognitive Status: No family/caregiver present to determine baseline cognitive functioning                                 General Comments: follows commands consistently      General Comments      Exercises     Assessment/Plan    PT Assessment All further PT needs can be met in the next venue of care  PT Problem List Decreased range of motion       PT Treatment Interventions  PT Goals (Current goals can be found in the Care Plan section)  Acute Rehab PT Goals PT Goal Formulation: All assessment and education complete, DC therapy    Frequency     Barriers to discharge        Co-evaluation               AM-PAC PT "6 Clicks" Mobility  Outcome Measure Help needed turning from your back to your side while in a flat bed without using bedrails?: None Help needed moving from lying on your back to sitting on the side of a flat bed without using bedrails?: None Help needed moving to and from a bed to a chair (including a wheelchair)?: A Little Help needed standing up from a chair using your arms (e.g., wheelchair or bedside chair)?: A Little Help  needed to walk in hospital room?: A Little Help needed climbing 3-5 steps with a railing? : A Little 6 Click Score: 20    End of Session Equipment Utilized During Treatment: Gait belt Activity Tolerance: Patient tolerated treatment well Patient left: in bed;with call bell/phone within reach;with bed alarm set   PT Visit Diagnosis: Difficulty in walking, not elsewhere classified (R26.2);Unsteadiness on feet (R26.81)    Time: 1050-1104 PT Time Calculation (min) (ACUTE ONLY): 14 min   Charges:   PT Evaluation $PT Eval Low Complexity: [place], PT   Acute Rehab Dept [facility]): [phone]   09/07/2019   [facility] 09/07/2019, 12:16 PM

## 2019-09-08 LAB — PTH, INTACT AND CALCIUM
Calcium, Total (PTH): 5.1 mg/dL (ref 8.6–10.2)
PTH: 46 pg/mL (ref 15–65)

## 2019-09-16 ENCOUNTER — Other Ambulatory Visit: Payer: Self-pay

## 2019-09-16 ENCOUNTER — Emergency Department (HOSPITAL_COMMUNITY): Payer: Medicare Other

## 2019-09-16 ENCOUNTER — Encounter (HOSPITAL_COMMUNITY): Payer: Self-pay | Admitting: Emergency Medicine

## 2019-09-16 ENCOUNTER — Inpatient Hospital Stay (HOSPITAL_COMMUNITY)
Admission: EM | Admit: 2019-09-16 | Discharge: 2019-09-21 | DRG: 291 | Disposition: A | Discharge status: Home Health | Payer: Medicare Other | Attending: Student | Admitting: Student

## 2019-09-16 DIAGNOSIS — T380X5A Adverse effect of glucocorticoids and synthetic analogues, initial encounter: Secondary | ICD-10-CM | POA: Diagnosis not present

## 2019-09-16 DIAGNOSIS — Z833 Family history of diabetes mellitus: Secondary | ICD-10-CM

## 2019-09-16 DIAGNOSIS — E785 Hyperlipidemia, unspecified: Secondary | ICD-10-CM | POA: Diagnosis present

## 2019-09-16 DIAGNOSIS — I130 Hypertensive heart and chronic kidney disease with heart failure and stage 1 through stage 4 chronic kidney disease, or unspecified chronic kidney disease: Principal | ICD-10-CM | POA: Diagnosis present

## 2019-09-16 DIAGNOSIS — I4581 Long QT syndrome: Secondary | ICD-10-CM | POA: Diagnosis present

## 2019-09-16 DIAGNOSIS — E872 Acidosis: Secondary | ICD-10-CM | POA: Diagnosis not present

## 2019-09-16 DIAGNOSIS — I959 Hypotension, unspecified: Secondary | ICD-10-CM | POA: Diagnosis not present

## 2019-09-16 DIAGNOSIS — R1310 Dysphagia, unspecified: Secondary | ICD-10-CM | POA: Diagnosis present

## 2019-09-16 DIAGNOSIS — I10 Essential (primary) hypertension: Secondary | ICD-10-CM | POA: Diagnosis present

## 2019-09-16 DIAGNOSIS — J438 Other emphysema: Secondary | ICD-10-CM | POA: Diagnosis present

## 2019-09-16 DIAGNOSIS — J84112 Idiopathic pulmonary fibrosis: Secondary | ICD-10-CM | POA: Diagnosis present

## 2019-09-16 DIAGNOSIS — R0902 Hypoxemia: Secondary | ICD-10-CM

## 2019-09-16 DIAGNOSIS — Z8619 Personal history of other infectious and parasitic diseases: Secondary | ICD-10-CM

## 2019-09-16 DIAGNOSIS — I5032 Chronic diastolic (congestive) heart failure: Secondary | ICD-10-CM

## 2019-09-16 DIAGNOSIS — Z87891 Personal history of nicotine dependence: Secondary | ICD-10-CM

## 2019-09-16 DIAGNOSIS — Z8249 Family history of ischemic heart disease and other diseases of the circulatory system: Secondary | ICD-10-CM

## 2019-09-16 DIAGNOSIS — N179 Acute kidney failure, unspecified: Secondary | ICD-10-CM | POA: Diagnosis present

## 2019-09-16 DIAGNOSIS — Z7982 Long term (current) use of aspirin: Secondary | ICD-10-CM

## 2019-09-16 DIAGNOSIS — J8410 Pulmonary fibrosis, unspecified: Secondary | ICD-10-CM | POA: Diagnosis present

## 2019-09-16 DIAGNOSIS — K0889 Other specified disorders of teeth and supporting structures: Secondary | ICD-10-CM | POA: Diagnosis present

## 2019-09-16 DIAGNOSIS — J9601 Acute respiratory failure with hypoxia: Secondary | ICD-10-CM | POA: Diagnosis present

## 2019-09-16 DIAGNOSIS — J9621 Acute and chronic respiratory failure with hypoxia: Secondary | ICD-10-CM | POA: Diagnosis present

## 2019-09-16 DIAGNOSIS — D631 Anemia in chronic kidney disease: Secondary | ICD-10-CM | POA: Diagnosis present

## 2019-09-16 DIAGNOSIS — Z951 Presence of aortocoronary bypass graft: Secondary | ICD-10-CM

## 2019-09-16 DIAGNOSIS — D649 Anemia, unspecified: Secondary | ICD-10-CM | POA: Diagnosis present

## 2019-09-16 DIAGNOSIS — Z79899 Other long term (current) drug therapy: Secondary | ICD-10-CM

## 2019-09-16 DIAGNOSIS — R0602 Shortness of breath: Secondary | ICD-10-CM | POA: Diagnosis not present

## 2019-09-16 DIAGNOSIS — I080 Rheumatic disorders of both mitral and aortic valves: Secondary | ICD-10-CM | POA: Diagnosis present

## 2019-09-16 DIAGNOSIS — E1122 Type 2 diabetes mellitus with diabetic chronic kidney disease: Secondary | ICD-10-CM | POA: Diagnosis present

## 2019-09-16 DIAGNOSIS — I248 Other forms of acute ischemic heart disease: Secondary | ICD-10-CM | POA: Diagnosis present

## 2019-09-16 DIAGNOSIS — N184 Chronic kidney disease, stage 4 (severe): Secondary | ICD-10-CM | POA: Diagnosis present

## 2019-09-16 DIAGNOSIS — Z1371 Encounter for nonprocreative screening for genetic disease carrier status: Secondary | ICD-10-CM

## 2019-09-16 DIAGNOSIS — R609 Edema, unspecified: Secondary | ICD-10-CM

## 2019-09-16 DIAGNOSIS — R7989 Other specified abnormal findings of blood chemistry: Secondary | ICD-10-CM | POA: Diagnosis present

## 2019-09-16 DIAGNOSIS — I509 Heart failure, unspecified: Secondary | ICD-10-CM

## 2019-09-16 DIAGNOSIS — I5033 Acute on chronic diastolic (congestive) heart failure: Secondary | ICD-10-CM | POA: Diagnosis present

## 2019-09-16 DIAGNOSIS — N400 Enlarged prostate without lower urinary tract symptoms: Secondary | ICD-10-CM | POA: Diagnosis present

## 2019-09-16 DIAGNOSIS — Z20822 Contact with and (suspected) exposure to covid-19: Secondary | ICD-10-CM | POA: Diagnosis present

## 2019-09-16 DIAGNOSIS — F259 Schizoaffective disorder, unspecified: Secondary | ICD-10-CM | POA: Diagnosis present

## 2019-09-16 DIAGNOSIS — I2510 Atherosclerotic heart disease of native coronary artery without angina pectoris: Secondary | ICD-10-CM | POA: Diagnosis present

## 2019-09-16 DIAGNOSIS — E1165 Type 2 diabetes mellitus with hyperglycemia: Secondary | ICD-10-CM | POA: Diagnosis not present

## 2019-09-16 DIAGNOSIS — F250 Schizoaffective disorder, bipolar type: Secondary | ICD-10-CM | POA: Diagnosis present

## 2019-09-16 DIAGNOSIS — Z9981 Dependence on supplemental oxygen: Secondary | ICD-10-CM

## 2019-09-16 DIAGNOSIS — Z66 Do not resuscitate: Secondary | ICD-10-CM | POA: Diagnosis not present

## 2019-09-16 DIAGNOSIS — R911 Solitary pulmonary nodule: Secondary | ICD-10-CM | POA: Diagnosis present

## 2019-09-16 DIAGNOSIS — D509 Iron deficiency anemia, unspecified: Secondary | ICD-10-CM | POA: Diagnosis present

## 2019-09-16 LAB — CBG MONITORING, ED: Glucose-Capillary: 221 mg/dL — ABNORMAL HIGH (ref 70–99)

## 2019-09-16 MED ORDER — ALBUTEROL SULFATE HFA 108 (90 BASE) MCG/ACT IN AERS
8.0000 | INHALATION_SPRAY | Freq: Once | RESPIRATORY_TRACT | Status: AC
  Administered 2019-09-16: 23:00:00 8 via RESPIRATORY_TRACT
  Filled 2019-09-16: qty 6.7

## 2019-09-16 MED ORDER — AEROCHAMBER PLUS FLO-VU LARGE MISC
1.0000 | Freq: Once | Status: AC
  Administered 2019-09-16: 1

## 2019-09-16 NOTE — ED Provider Notes (Signed)
[facility] EMERGENCY DEPARTMENT Provider Note   CSN: [id number] Arrival date & time: 09/16/19  2225     History Chief Complaint  Patient presents with  . Shortness of Breath    Robert Little is a 79 y.o. male with a hx of coronary artery disease, anemia, GERD, hypertension, pulmonary fibrosis, schizoaffective disorder presents to the Emergency Department complaining of gradual, persistent, progressively worsening generalized weakness and shortness of breath onset this morning.  Patient reports throughout the day to become harder and harder to breathe.  He reports some mild cough.  Per EMS, patient found to have room air saturations in the 80s.  He does not wear oxygen at home.  No fever or chills, headache or neck pain, chest pain, abdominal pain, nausea, vomiting, diarrhea, syncope.  Patient denies bloody or tarry stools.  No aggravating or alleviating factors.  Records reviewed.  Patient was admitted on 09/05/2019 for syncope, AKI and electrolyte abnormalities.  Patient was given fluids and electrolyte supplementation with improvement.  He had good oral intake and was discharged home on 3/5.  Patient reports he has not had any additional problems since the discharge until today.  The history is provided by the patient and medical records. No language interpreter was used.       Past Medical History:  Diagnosis Date  . Anemia   . Coronary artery disease   . GERD (gastroesophageal reflux disease)   . Hypertension   . Pulmonary fibrosis ([place])   . Schizo affective schizophrenia [facility])     Patient Active Problem List   Diagnosis Date Noted  . Acute respiratory failure with hypoxia ([place]) 09/17/2019  . Syncope 09/05/2019  . Hypocalcemia 09/05/2019  . Hypomagnesemia 09/05/2019  . Acute urinary retention 05/18/2018  . UTI (urinary tract infection) 05/15/2018  . Sepsis ([place]) 05/15/2018  . Hypokalemia 05/15/2018  . Acute kidney injury superimposed on CKD ([place])  05/15/2018  . HLD (hyperlipidemia) 05/15/2018  . Coronary artery disease   . Hypertension   . Pulmonary fibrosis ([place])   . Schizo affective schizophrenia ([place])   . Anemia     Past Surgical History:  Procedure Laterality Date  . CORONARY ARTERY BYPASS GRAFT         Family History  Problem Relation Age of Onset  . Diabetes Mellitus II Mother   . Diabetes Mellitus II Maternal Grandmother   . Heart disease Maternal Grandmother     Social History   Tobacco Use  . Smoking status: Former Smoker    Types: Cigarettes  . Smokeless tobacco: Never Used  Substance Use Topics  . Alcohol use: Not Currently  . Drug use: Not Currently    Home Medications Prior to Admission medications   Medication Sig Start Date End Date Taking? Authorizing Provider  acetaminophen (TYLENOL) 325 MG tablet Take 2 tablets (650 mg total) by mouth every 6 (six) hours as needed for mild pain (or Fever >/= 101). 05/18/18   Debbe Odea, MD  aspirin 325 MG tablet Take 325 mg by mouth daily.    [provider]  atenolol (TENORMIN) 25 MG tablet Take 25 mg by mouth daily.    [provider]  atorvastatin (LIPITOR) 20 MG tablet Take 20 mg by mouth at bedtime.    [provider]  benztropine (COGENTIN) 0.5 MG tablet Take 0.5 mg by mouth 2 (two) times daily.    [provider]  calcium gluconate 500 MG tablet Take 1 tablet (500 mg total) by mouth 2 (  two) times daily. 09/07/19   Patrecia Pour, MD  clonazePAM (KLONOPIN) 0.5 MG tablet Take 0.5 mg by mouth 2 (two) times daily.     [provider]  Dexlansoprazole (DEXILANT) 30 MG capsule Take 30 mg by mouth daily.    [provider]  Multiple Vitamins-Minerals ([employer] SPECTRAVITE PO) Take 1 tablet by mouth daily.    [provider]  tamsulosin (FLOMAX) 0.4 MG CAPS capsule Take 1 capsule (0.4 mg total) by mouth daily after supper. Patient taking differently: Take 0.4 mg by mouth daily.  05/18/18   Debbe Odea,  MD    Allergies    Patient has no known allergies.  Review of Systems   Review of Systems  Constitutional: Positive for fatigue. Negative for appetite change, diaphoresis, fever and unexpected weight change.  HENT: Negative for mouth sores.   Eyes: Negative for visual disturbance.  Respiratory: Positive for shortness of breath. Negative for cough, chest tightness and wheezing.   Cardiovascular: Negative for chest pain.  Gastrointestinal: Negative for abdominal pain, constipation, diarrhea, nausea and vomiting.  Endocrine: Negative for polydipsia, polyphagia and polyuria.  Genitourinary: Negative for dysuria, frequency, hematuria and urgency.  Musculoskeletal: Negative for back pain and neck stiffness.  Skin: Negative for rash.  Allergic/Immunologic: Negative for immunocompromised state.  Neurological: Negative for syncope, light-headedness and headaches.  Hematological: Does not bruise/bleed easily.  Psychiatric/Behavioral: Negative for sleep disturbance. The patient is not nervous/anxious.     Physical Exam Updated Vital Signs BP (!) 111/57 (BP Location: Left Arm)   Pulse 74   Temp (!) 97.5 F (36.4 C) (Oral)   Resp (!) 30   SpO2 92% Comment: Simultaneous filing. User may not have seen previous data.  Physical Exam Vitals and nursing note reviewed.  Constitutional:      General: He is not in acute distress.    Appearance: He is not diaphoretic.  HENT:     Head: Normocephalic.  Eyes:     General: No scleral icterus.    Conjunctiva/sclera: Conjunctivae normal.  Cardiovascular:     Rate and Rhythm: Normal rate and regular rhythm.     Pulses: Normal pulses.          Radial pulses are 2+ on the right side and 2+ on the left side.  Pulmonary:     Effort: Tachypnea present. No accessory muscle usage, prolonged expiration, respiratory distress or retractions.     Breath sounds: Normal air entry. No stridor. Decreased breath sounds present.     Comments: Equal chest  rise. No increased work of breathing Coarse breath sounds throughout, no focal wheezes, rhonchi or rails Abdominal:     General: There is no distension.     Palpations: Abdomen is soft.     Tenderness: There is no abdominal tenderness. There is no guarding or rebound.  Musculoskeletal:     Cervical back: Normal range of motion.     Right lower leg: Edema present.     Left lower leg: Edema present.     Comments: Moves all extremities equally and without difficulty.  Skin:    General: Skin is warm and dry.     Capillary Refill: Capillary refill takes less than 2 seconds.     Coloration: Skin is pale.  Neurological:     Mental Status: He is alert.     GCS: GCS eye subscore is 4. GCS verbal subscore is 5. GCS motor subscore is 6.     Comments: Speech is clear and goal oriented.  Psychiatric:        Mood and Affect: Mood normal.     ED Results / Procedures / Treatments   Labs (all labs ordered are listed, but only abnormal results are displayed) Labs Reviewed  CBC WITH DIFFERENTIAL/PLATELET - Abnormal; Notable for the following components:      Result Value   RBC 2.73 (*)    Hemoglobin 8.1 (*)    HCT 26.3 (*)    Abs Immature Granulocytes 0.11 (*)    All other components within normal limits  COMPREHENSIVE METABOLIC PANEL - Abnormal; Notable for the following components:   CO2 16 (*)    Glucose, Bld 205 (*)    BUN 43 (*)    Creatinine, Ser 3.11 (*)    Calcium 6.9 (*)    Total Protein 6.4 (*)    Albumin 2.9 (*)    AST 50 (*)    ALT 48 (*)    GFR calc non Af Amer 18 (*)    GFR calc Af Amer 21 (*)    Anion gap 17 (*)    All other components within normal limits  BRAIN NATRIURETIC PEPTIDE - Abnormal; Notable for the following components:   B Natriuretic Peptide 2,353.4 (*)    All other components within normal limits  D-DIMER, QUANTITATIVE (NOT AT [facility]) - Abnormal; Notable for the following components:   D-Dimer, Quant 5.68 (*)    All other components within normal limits   CBG MONITORING, ED - Abnormal; Notable for the following components:   Glucose-Capillary 221 (*)    All other components within normal limits  TROPONIN I (HIGH SENSITIVITY) - Abnormal; Notable for the following components:   Troponin I (High Sensitivity) 80 (*)    All other components within normal limits  SARS CORONAVIRUS 2 (TAT 6-24 HRS)  POC SARS CORONAVIRUS 2 AG -  ED  TYPE AND SCREEN  ABO/RH  TROPONIN I (HIGH SENSITIVITY)    EKG EKG Interpretation  Date/Time:  Sunday September 16 2019 22:36:27 EDT Ventricular Rate:  72 PR Interval:    QRS Duration: 99 QT Interval:  457 QTC Calculation: 501 R Axis:   12 Text Interpretation: Sinus rhythm Low voltage, precordial leads Borderline repolarization abnormality Prolonged QT interval No significant change since last tracing Confirmed by Pryor Curia [phone]) on 09/17/2019 12:53:47 AM     Radiology DG Chest Port 1 View  Result Date: 09/16/2019 CLINICAL DATA:  79 year old male with shortness of breath. EXAM: PORTABLE CHEST 1 VIEW COMPARISON:  Chest radiograph dated 09/05/2019. FINDINGS: There is diffuse interstitial coarsening and chronic bronchitic changes. There is however overall interval increase in the interstitial densities compared to the prior radiograph which may represent progression of underlying fibrosis or superimposition of pneumonia, possibly viral or atypical. Clinical correlation is recommended. No focal consolidation, pleural effusion, or pneumothorax. Stable cardiomediastinal silhouette. Median sternotomy wires and CABG vascular clips. No acute osseous pathology. IMPRESSION: Interval increase in the interstitial densities compared to the prior radiograph may represent progression of underlying fibrosis or superimposition of pneumonia. Clinical correlation is recommended. Electronically Signed   By: Anner Crete M.D.   On: 09/16/2019 23:24    Procedures Procedures (including critical care time)  Medications Ordered in  ED Medications  albuterol (VENTOLIN HFA) 108 (90 Base) MCG/ACT inhaler 8 puff (8 puffs Inhalation Given 09/16/19 2256)  AeroChamber Plus Flo-Vu Large MISC 1 each (1 each Other Given 09/16/19 2304)  furosemide (LASIX) injection 40 mg (40 mg Intravenous Given 09/17/19 0118)    ED  Course  I have reviewed the triage vital signs and the nursing notes.  Pertinent labs & imaging results that were available during my care of the patient were reviewed by me and considered in my medical decision making (see chart for details).    MDM Rules/Calculators/A&P                       Patient presents to the emergency department with significant shortness of breath.  He was found hypoxic by EMS.  Tachypneic but without respiratory distress.  Pale on exam.  He denies recent bloody stools.  He does have a history of anemia.  Patient requiring 4 L via nasal cannula to maintain oxygen saturations.  Peripheral edema on exam.  Echocardiogram performed while patient was hospitalized in March 2021 shows left diastolic dysfunction with normal EF.  Patient has no documented history of congestive heart failure.  2:12 AM Patient here with shortness of breath.  Worsening creatinine of 3.11 up from 2.7 during previous hospitalization.  He is found to be anemic with a hemoglobin of 8.1.  This appears to be baseline.  Given leg swelling and BNP greater than 2000, Lasix was given.  Initial troponin 80 however patient denies chest pain.  He remains tachypneic here in the emergency department but without tachycardia.  Given his recent hospitalization concern for possible pulmonary embolism.  D-dimer 5.68 however patient cannot have CT scan at this time given his chronic kidney disease.  Mildly elevated AST/ALT from previous.  Covid antigen negative.  Covid PCR sent.  Patient will need to be admitted for new oxygen requirement, fluid overload and shortness of breath.  He will need PE and DVT rule out while in the hospital.  The  patient was discussed with and seen by Dr. Leonides Schanz who agrees with the treatment plan.  Discussed patient's case with hospitalist, Dr. Myna Hidalgo.  I have recommended admission and patient (and family if present) agree with this plan. Admitting physician will place admission orders.    Final Clinical Impression(s) / ED Diagnoses Final diagnoses:  Hypoxia  SOB (shortness of breath)  Peripheral edema    Rx / DC Orders ED Discharge Orders    None       Shady Padron, Gwenlyn Perking 09/17/19 [place], Delice Bison, DO 09/17/19 [phone]

## 2019-09-16 NOTE — ED Triage Notes (Signed)
Pt arrived from group home w/ c/o [facility]. Pt found in 80s SPO2 by ems on room air. Pt placed on 2l [place] enroute. On arrival pt 96% on 2L

## 2019-09-17 ENCOUNTER — Inpatient Hospital Stay (HOSPITAL_COMMUNITY): Payer: Medicare Other

## 2019-09-17 ENCOUNTER — Encounter (HOSPITAL_COMMUNITY): Payer: Self-pay | Admitting: Family Medicine

## 2019-09-17 DIAGNOSIS — R7989 Other specified abnormal findings of blood chemistry: Secondary | ICD-10-CM | POA: Diagnosis present

## 2019-09-17 DIAGNOSIS — J9601 Acute respiratory failure with hypoxia: Secondary | ICD-10-CM | POA: Diagnosis present

## 2019-09-17 DIAGNOSIS — N179 Acute kidney failure, unspecified: Secondary | ICD-10-CM | POA: Diagnosis present

## 2019-09-17 DIAGNOSIS — D509 Iron deficiency anemia, unspecified: Secondary | ICD-10-CM | POA: Diagnosis present

## 2019-09-17 DIAGNOSIS — Z79899 Other long term (current) drug therapy: Secondary | ICD-10-CM | POA: Diagnosis not present

## 2019-09-17 DIAGNOSIS — J9611 Chronic respiratory failure with hypoxia: Secondary | ICD-10-CM | POA: Diagnosis not present

## 2019-09-17 DIAGNOSIS — R609 Edema, unspecified: Secondary | ICD-10-CM | POA: Diagnosis not present

## 2019-09-17 DIAGNOSIS — Z9981 Dependence on supplemental oxygen: Secondary | ICD-10-CM | POA: Diagnosis not present

## 2019-09-17 DIAGNOSIS — I080 Rheumatic disorders of both mitral and aortic valves: Secondary | ICD-10-CM | POA: Diagnosis present

## 2019-09-17 DIAGNOSIS — I5033 Acute on chronic diastolic (congestive) heart failure: Secondary | ICD-10-CM | POA: Diagnosis present

## 2019-09-17 DIAGNOSIS — Z87891 Personal history of nicotine dependence: Secondary | ICD-10-CM | POA: Diagnosis not present

## 2019-09-17 DIAGNOSIS — I959 Hypotension, unspecified: Secondary | ICD-10-CM | POA: Diagnosis not present

## 2019-09-17 DIAGNOSIS — J438 Other emphysema: Secondary | ICD-10-CM | POA: Diagnosis present

## 2019-09-17 DIAGNOSIS — I4581 Long QT syndrome: Secondary | ICD-10-CM | POA: Diagnosis present

## 2019-09-17 DIAGNOSIS — I248 Other forms of acute ischemic heart disease: Secondary | ICD-10-CM | POA: Diagnosis present

## 2019-09-17 DIAGNOSIS — I130 Hypertensive heart and chronic kidney disease with heart failure and stage 1 through stage 4 chronic kidney disease, or unspecified chronic kidney disease: Secondary | ICD-10-CM | POA: Diagnosis present

## 2019-09-17 DIAGNOSIS — E785 Hyperlipidemia, unspecified: Secondary | ICD-10-CM | POA: Diagnosis present

## 2019-09-17 DIAGNOSIS — N184 Chronic kidney disease, stage 4 (severe): Secondary | ICD-10-CM | POA: Diagnosis present

## 2019-09-17 DIAGNOSIS — Z66 Do not resuscitate: Secondary | ICD-10-CM | POA: Diagnosis not present

## 2019-09-17 DIAGNOSIS — Z951 Presence of aortocoronary bypass graft: Secondary | ICD-10-CM | POA: Diagnosis not present

## 2019-09-17 DIAGNOSIS — I509 Heart failure, unspecified: Secondary | ICD-10-CM

## 2019-09-17 DIAGNOSIS — F250 Schizoaffective disorder, bipolar type: Secondary | ICD-10-CM | POA: Diagnosis present

## 2019-09-17 DIAGNOSIS — D631 Anemia in chronic kidney disease: Secondary | ICD-10-CM | POA: Diagnosis present

## 2019-09-17 DIAGNOSIS — I25118 Atherosclerotic heart disease of native coronary artery with other forms of angina pectoris: Secondary | ICD-10-CM | POA: Diagnosis not present

## 2019-09-17 DIAGNOSIS — E872 Acidosis: Secondary | ICD-10-CM | POA: Diagnosis not present

## 2019-09-17 DIAGNOSIS — R0602 Shortness of breath: Secondary | ICD-10-CM | POA: Diagnosis present

## 2019-09-17 DIAGNOSIS — I5032 Chronic diastolic (congestive) heart failure: Secondary | ICD-10-CM | POA: Diagnosis not present

## 2019-09-17 DIAGNOSIS — E1122 Type 2 diabetes mellitus with diabetic chronic kidney disease: Secondary | ICD-10-CM | POA: Diagnosis present

## 2019-09-17 DIAGNOSIS — N400 Enlarged prostate without lower urinary tract symptoms: Secondary | ICD-10-CM | POA: Diagnosis present

## 2019-09-17 DIAGNOSIS — Z7902 Long term (current) use of antithrombotics/antiplatelets: Secondary | ICD-10-CM | POA: Diagnosis not present

## 2019-09-17 DIAGNOSIS — E1165 Type 2 diabetes mellitus with hyperglycemia: Secondary | ICD-10-CM | POA: Diagnosis not present

## 2019-09-17 DIAGNOSIS — T380X5A Adverse effect of glucocorticoids and synthetic analogues, initial encounter: Secondary | ICD-10-CM | POA: Diagnosis not present

## 2019-09-17 DIAGNOSIS — J9621 Acute and chronic respiratory failure with hypoxia: Secondary | ICD-10-CM | POA: Diagnosis present

## 2019-09-17 DIAGNOSIS — Z20822 Contact with and (suspected) exposure to covid-19: Secondary | ICD-10-CM | POA: Diagnosis present

## 2019-09-17 DIAGNOSIS — J84112 Idiopathic pulmonary fibrosis: Secondary | ICD-10-CM | POA: Diagnosis present

## 2019-09-17 DIAGNOSIS — I2510 Atherosclerotic heart disease of native coronary artery without angina pectoris: Secondary | ICD-10-CM | POA: Diagnosis present

## 2019-09-17 LAB — URINALYSIS, COMPLETE (UACMP) WITH MICROSCOPIC
Bilirubin Urine: NEGATIVE
Glucose, UA: NEGATIVE mg/dL
Hgb urine dipstick: NEGATIVE
Ketones, ur: NEGATIVE mg/dL
Leukocytes,Ua: NEGATIVE
Nitrite: NEGATIVE
Protein, ur: NEGATIVE mg/dL
Specific Gravity, Urine: 1.009 (ref 1.005–1.030)
pH: 5 (ref 5.0–8.0)

## 2019-09-17 LAB — CBC WITH DIFFERENTIAL/PLATELET
Abs Immature Granulocytes: 0.11 10*3/uL — ABNORMAL HIGH (ref 0.00–0.07)
Basophils Absolute: 0 10*3/uL (ref 0.0–0.1)
Basophils Relative: 0 %
Eosinophils Absolute: 0 10*3/uL (ref 0.0–0.5)
Eosinophils Relative: 0 %
HCT: 26.3 % — ABNORMAL LOW (ref 39.0–52.0)
Hemoglobin: 8.1 g/dL — ABNORMAL LOW (ref 13.0–17.0)
Immature Granulocytes: 1 %
Lymphocytes Relative: 8 %
Lymphs Abs: 0.7 10*3/uL (ref 0.7–4.0)
MCH: 29.7 pg (ref 26.0–34.0)
MCHC: 30.8 g/dL (ref 30.0–36.0)
MCV: 96.3 fL (ref 80.0–100.0)
Monocytes Absolute: 0.6 10*3/uL (ref 0.1–1.0)
Monocytes Relative: 7 %
Neutro Abs: 7.2 10*3/uL (ref 1.7–7.7)
Neutrophils Relative %: 84 %
Platelets: 241 10*3/uL (ref 150–400)
RBC: 2.73 MIL/uL — ABNORMAL LOW (ref 4.22–5.81)
RDW: 14.2 % (ref 11.5–15.5)
WBC: 8.7 10*3/uL (ref 4.0–10.5)
nRBC: 0 % (ref 0.0–0.2)

## 2019-09-17 LAB — C-REACTIVE PROTEIN: CRP: 20.4 mg/dL — ABNORMAL HIGH (ref <1.0)

## 2019-09-17 LAB — BASIC METABOLIC PANEL
Anion gap: 15 (ref 5–15)
BUN: 44 mg/dL — ABNORMAL HIGH (ref 8–23)
CO2: 22 mmol/L (ref 22–32)
Calcium: 6.7 mg/dL — ABNORMAL LOW (ref 8.9–10.3)
Chloride: 104 mmol/L (ref 98–111)
Creatinine, Ser: 3.05 mg/dL — ABNORMAL HIGH (ref 0.61–1.24)
GFR calc Af Amer: 22 mL/min — ABNORMAL LOW (ref >60)
GFR calc non Af Amer: 19 mL/min — ABNORMAL LOW (ref >60)
Glucose, Bld: 160 mg/dL — ABNORMAL HIGH (ref 70–99)
Potassium: 3.9 mmol/L (ref 3.5–5.1)
Sodium: 141 mmol/L (ref 135–145)

## 2019-09-17 LAB — BRAIN NATRIURETIC PEPTIDE
B Natriuretic Peptide: 2353.4 pg/mL — ABNORMAL HIGH (ref 0.0–100.0)
B Natriuretic Peptide: 2182.6 pg/mL — ABNORMAL HIGH (ref 0.0–100.0)

## 2019-09-17 LAB — CBC
HCT: 25 % — ABNORMAL LOW (ref 39.0–52.0)
Hemoglobin: 7.7 g/dL — ABNORMAL LOW (ref 13.0–17.0)
MCH: 29.7 pg (ref 26.0–34.0)
MCHC: 30.8 g/dL (ref 30.0–36.0)
MCV: 96.5 fL (ref 80.0–100.0)
Platelets: 253 10*3/uL (ref 150–400)
RBC: 2.59 MIL/uL — ABNORMAL LOW (ref 4.22–5.81)
RDW: 14.2 % (ref 11.5–15.5)
WBC: 9.2 10*3/uL (ref 4.0–10.5)
nRBC: 0 % (ref 0.0–0.2)

## 2019-09-17 LAB — COMPREHENSIVE METABOLIC PANEL
ALT: 48 U/L — ABNORMAL HIGH (ref 0–44)
AST: 50 U/L — ABNORMAL HIGH (ref 15–41)
Albumin: 2.9 g/dL — ABNORMAL LOW (ref 3.5–5.0)
Alkaline Phosphatase: 86 U/L (ref 38–126)
Anion gap: 17 — ABNORMAL HIGH (ref 5–15)
BUN: 43 mg/dL — ABNORMAL HIGH (ref 8–23)
CO2: 16 mmol/L — ABNORMAL LOW (ref 22–32)
Calcium: 6.9 mg/dL — ABNORMAL LOW (ref 8.9–10.3)
Chloride: 107 mmol/L (ref 98–111)
Creatinine, Ser: 3.11 mg/dL — ABNORMAL HIGH (ref 0.61–1.24)
GFR calc Af Amer: 21 mL/min — ABNORMAL LOW (ref >60)
GFR calc non Af Amer: 18 mL/min — ABNORMAL LOW (ref >60)
Glucose, Bld: 205 mg/dL — ABNORMAL HIGH (ref 70–99)
Potassium: 4.7 mmol/L (ref 3.5–5.1)
Sodium: 140 mmol/L (ref 135–145)
Total Bilirubin: 0.6 mg/dL (ref 0.3–1.2)
Total Protein: 6.4 g/dL — ABNORMAL LOW (ref 6.5–8.1)

## 2019-09-17 LAB — MAGNESIUM: Magnesium: 1.1 mg/dL — ABNORMAL LOW (ref 1.7–2.4)

## 2019-09-17 LAB — SODIUM, URINE, RANDOM: Sodium, Ur: 93 mmol/L

## 2019-09-17 LAB — TROPONIN I (HIGH SENSITIVITY)
Troponin I (High Sensitivity): 80 ng/L — ABNORMAL HIGH (ref <18)
Troponin I (High Sensitivity): 102 ng/L (ref <18)
Troponin I (High Sensitivity): 133 ng/L (ref <18)
Troponin I (High Sensitivity): 123 ng/L (ref <18)

## 2019-09-17 LAB — TYPE AND SCREEN
ABO/RH(D): O POS
Antibody Screen: NEGATIVE

## 2019-09-17 LAB — D-DIMER, QUANTITATIVE
D-Dimer, Quant: 5.68 ug/mL-FEU — ABNORMAL HIGH (ref 0.00–0.50)
D-Dimer, Quant: 5.46 ug/mL-FEU — ABNORMAL HIGH (ref 0.00–0.50)

## 2019-09-17 LAB — CREATININE, URINE, RANDOM: Creatinine, Urine: 53.7 mg/dL

## 2019-09-17 LAB — POC SARS CORONAVIRUS 2 AG -  ED: SARS Coronavirus 2 Ag: NEGATIVE

## 2019-09-17 LAB — HEPARIN LEVEL (UNFRACTIONATED)
Heparin Unfractionated: 0.72 IU/mL — ABNORMAL HIGH (ref 0.30–0.70)
Heparin Unfractionated: 0.55 IU/mL (ref 0.30–0.70)

## 2019-09-17 LAB — SARS CORONAVIRUS 2 (TAT 6-24 HRS): SARS Coronavirus 2: NEGATIVE

## 2019-09-17 LAB — ABO/RH: ABO/RH(D): O POS

## 2019-09-17 MED ORDER — HEPARIN (PORCINE) 25000 UT/250ML-% IV SOLN
1200.0000 [IU]/h | INTRAVENOUS | Status: DC
  Administered 2019-09-17: 1250 [IU]/h via INTRAVENOUS
  Administered 2019-09-17: 1200 [IU]/h via INTRAVENOUS
  Filled 2019-09-17 (×2): qty 250

## 2019-09-17 MED ORDER — NITROGLYCERIN 2 % TD OINT
0.5000 [in_us] | TOPICAL_OINTMENT | Freq: Four times a day (QID) | TRANSDERMAL | Status: DC
  Administered 2019-09-17 (×2): 0.5 [in_us] via TOPICAL
  Filled 2019-09-17: qty 1

## 2019-09-17 MED ORDER — METOLAZONE 2.5 MG PO TABS
2.5000 mg | ORAL_TABLET | Freq: Once | ORAL | Status: AC
  Administered 2019-09-17: 2.5 mg via ORAL
  Filled 2019-09-17: qty 1

## 2019-09-17 MED ORDER — ATORVASTATIN CALCIUM 10 MG PO TABS
20.0000 mg | ORAL_TABLET | Freq: Every day | ORAL | Status: DC
  Administered 2019-09-17 – 2019-09-20 (×4): 20 mg via ORAL
  Filled 2019-09-17 (×4): qty 2

## 2019-09-17 MED ORDER — FUROSEMIDE 10 MG/ML IJ SOLN
40.0000 mg | Freq: Once | INTRAMUSCULAR | Status: AC
  Administered 2019-09-17: 40 mg via INTRAVENOUS
  Filled 2019-09-17: qty 4

## 2019-09-17 MED ORDER — ASPIRIN 325 MG PO TABS
325.0000 mg | ORAL_TABLET | Freq: Every day | ORAL | Status: DC
  Administered 2019-09-17 – 2019-09-19 (×3): 325 mg via ORAL
  Filled 2019-09-17 (×3): qty 1

## 2019-09-17 MED ORDER — FUROSEMIDE 10 MG/ML IJ SOLN
40.0000 mg | Freq: Two times a day (BID) | INTRAMUSCULAR | Status: DC
  Administered 2019-09-17: 07:00:00 40 mg via INTRAVENOUS
  Filled 2019-09-17: qty 4

## 2019-09-17 MED ORDER — ONDANSETRON HCL 4 MG/2ML IJ SOLN: 4.0000 mg | Freq: Four times a day (QID) | INTRAMUSCULAR | Status: DC | PRN

## 2019-09-17 MED ORDER — FUROSEMIDE 10 MG/ML IJ SOLN
60.0000 mg | Freq: Once | INTRAMUSCULAR | Status: AC
  Administered 2019-09-17: 60 mg via INTRAVENOUS
  Filled 2019-09-17: qty 6

## 2019-09-17 MED ORDER — SODIUM CHLORIDE 0.9% FLUSH: 3.0000 mL | INTRAVENOUS | Status: DC | PRN

## 2019-09-17 MED ORDER — ALBUTEROL SULFATE (2.5 MG/3ML) 0.083% IN NEBU
2.5000 mg | INHALATION_SOLUTION | RESPIRATORY_TRACT | Status: DC | PRN
  Administered 2019-09-17: 15:00:00 2.5 mg via RESPIRATORY_TRACT
  Filled 2019-09-17: qty 3

## 2019-09-17 MED ORDER — FUROSEMIDE 10 MG/ML IJ SOLN
40.0000 mg | Freq: Once | INTRAMUSCULAR | Status: AC
  Administered 2019-09-17: 08:00:00 40 mg via INTRAVENOUS
  Filled 2019-09-17: qty 4

## 2019-09-17 MED ORDER — ATENOLOL 25 MG PO TABS
25.0000 mg | ORAL_TABLET | Freq: Every day | ORAL | Status: DC
  Administered 2019-09-17 – 2019-09-18 (×2): 25 mg via ORAL
  Filled 2019-09-17 (×3): qty 1

## 2019-09-17 MED ORDER — CLONAZEPAM 0.5 MG PO TABS
0.5000 mg | ORAL_TABLET | Freq: Two times a day (BID) | ORAL | Status: DC | PRN
  Administered 2019-09-17 – 2019-09-18 (×2): 0.5 mg via ORAL
  Filled 2019-09-17 (×2): qty 1

## 2019-09-17 MED ORDER — TAMSULOSIN HCL 0.4 MG PO CAPS
0.4000 mg | ORAL_CAPSULE | Freq: Every day | ORAL | Status: DC
  Administered 2019-09-17 – 2019-09-21 (×5): 0.4 mg via ORAL
  Filled 2019-09-17 (×5): qty 1

## 2019-09-17 MED ORDER — DEXAMETHASONE SODIUM PHOSPHATE 10 MG/ML IJ SOLN
8.0000 mg | INTRAMUSCULAR | Status: DC
  Administered 2019-09-17: 8 mg via INTRAVENOUS
  Filled 2019-09-17: qty 1

## 2019-09-17 MED ORDER — PANTOPRAZOLE SODIUM 40 MG PO TBEC
40.0000 mg | DELAYED_RELEASE_TABLET | Freq: Every day | ORAL | Status: DC
  Administered 2019-09-17 – 2019-09-21 (×5): 40 mg via ORAL
  Filled 2019-09-17 (×5): qty 1

## 2019-09-17 MED ORDER — SODIUM CHLORIDE 0.9 % IV SOLN: 250.0000 mL | INTRAVENOUS | Status: DC | PRN

## 2019-09-17 MED ORDER — HEPARIN BOLUS VIA INFUSION
4000.0000 [IU] | Freq: Once | INTRAVENOUS | Status: AC
  Administered 2019-09-17: 4000 [IU] via INTRAVENOUS
  Filled 2019-09-17: qty 4000

## 2019-09-17 MED ORDER — FAMOTIDINE IN NACL 20-0.9 MG/50ML-% IV SOLN
20.0000 mg | Freq: Two times a day (BID) | INTRAVENOUS | Status: DC
  Administered 2019-09-17 – 2019-09-18 (×3): 20 mg via INTRAVENOUS
  Filled 2019-09-17 (×4): qty 50

## 2019-09-17 MED ORDER — FUROSEMIDE 10 MG/ML IJ SOLN: 60.0000 mg | Freq: Once | INTRAMUSCULAR | Status: DC

## 2019-09-17 MED ORDER — ACETAMINOPHEN 325 MG PO TABS: 650.0000 mg | ORAL_TABLET | ORAL | Status: DC | PRN

## 2019-09-17 MED ORDER — BENZTROPINE MESYLATE 0.5 MG PO TABS
0.5000 mg | ORAL_TABLET | Freq: Two times a day (BID) | ORAL | Status: DC
  Administered 2019-09-17 – 2019-09-21 (×9): 0.5 mg via ORAL
  Filled 2019-09-17 (×10): qty 1

## 2019-09-17 MED ORDER — GUAIFENESIN-DM 100-10 MG/5ML PO SYRP
5.0000 mL | ORAL_SOLUTION | ORAL | Status: DC | PRN
  Administered 2019-09-17 – 2019-09-19 (×4): 5 mL via ORAL
  Filled 2019-09-17 (×4): qty 5

## 2019-09-17 MED ORDER — SODIUM CHLORIDE 0.9% FLUSH
3.0000 mL | Freq: Two times a day (BID) | INTRAVENOUS | Status: DC
  Administered 2019-09-17: 10:00:00 3 mL via INTRAVENOUS

## 2019-09-17 MED ORDER — FUROSEMIDE 10 MG/ML IJ SOLN: 40.0000 mg | Freq: Once | INTRAMUSCULAR | Status: DC

## 2019-09-17 MED ORDER — ASPIRIN 81 MG PO CHEW
324.0000 mg | CHEWABLE_TABLET | Freq: Once | ORAL | Status: DC
  Filled 2019-09-17: qty 4

## 2019-09-17 NOTE — Progress Notes (Signed)
ANTICOAGULATION CONSULT NOTE Pharmacy Consult for Heparin Indication: VTE  No Known Allergies  Patient Measurements: Height: 5\' 8"  (172.7 cm) Weight: 157 lb 10.1 oz (71.5 kg) IBW/kg (Calculated) : 68.4  Vital Signs: Temp: 97.6 F (36.4 C) (03/15 2242) Temp Source: Oral (03/15 2242) BP: 117/46 (03/15 2242) Pulse Rate: 75 (03/15 2254)  Labs: Recent Labs    09/16/19 2244 09/16/19 2244 09/17/19 0121 09/17/19 0449 09/17/19 0827 09/17/19 1209 09/17/19 1351 09/17/19 2250  HGB 8.1*  --   --   --  7.7*  --   --   --   HCT 26.3*  --   --   --  25.0*  --   --   --   PLT 241  --   --   --  253  --   --   --   HEPARINUNFRC  --   --   --   --   --  0.72*  --  0.55  CREATININE 3.11*  --   --   --  3.05*  --   --   --   TROPONINIHS 80*   < > 102* 133*  --   --  123*  --    < > = values in this interval not displayed.    Estimated Creatinine Clearance: 19.6 mL/min (A) (by C-G formula based on SCr of 3.05 mg/dL (H)).   Assessment: 79 y.o. male with with elevated d-dimer for heparin  Goal of Therapy:  Heparin level 0.3-0.7 units/ml Monitor platelets by anticoagulation protocol: Yes   Plan:  Continue Heparin at current rate   Caryl Pina 09/17/2019,11:26 PM

## 2019-09-17 NOTE — Progress Notes (Addendum)
PROGRESS NOTE                                                                                                                                                                                                             Patient Demographics:    Robert Little, is a 79 y.o. male, DOB - 1941/02/05, GGY:[id number]  Admit date - 09/16/2019   Admitting Physician Thurnell Lose, MD  Outpatient Primary MD for the patient is Patient, No Pcp Per  LOS - 0  Chief Complaint  Patient presents with  . Shortness of Breath       Brief Narrative   Robert Little is a 79 y.o. male with medical history significant for chronic anemia, pulmonary fibrosis, hypertension, coronary artery disease, chronic diastolic CHF, and schizoaffective disorder, presenting to the emergency department with progressive shortness of breath long with severe orthopnea starting 1 to 2 days prior to hospital visit, in the hospital work-up suggestive of CHF and acute hypoxic respiratory failure and he was admitted to the hospital.   Subjective:    Robert Little today has, No headache, No chest pain, No abdominal pain - No Nausea, No new weakness tingling or numbness, No Cough , improved SOB.   Assessment  & Plan :     1.  Acute hypoxic respiratory failure due to acute on chronic diastolic CHF EF 03% on recent echocardiogram - started on high dose IV lasix, salt and fluid restriction, resume B Blocker at home dose, Tele monitor, advance activity and titrate o2 as tolerated.  Addendum.  Around 2 PM while having lunch patient became more hypoxic and tachypneic.  Chest x-ray does not show any signs of aspiration but likely continued signs of worsening CHF.  60 mg IV Lasix given, Nitropaste applied, so far received 140 mg of IV Lasix.  Also will give low-dose Zaroxolyn.  Will keep him in PCU, may require as needed CPAP, if getting worse may require intubation as well.  Will monitor.   2. AKI on CKD 4 - baseline creatinine of  2.4, will diurese and monitor.  3. BPH - on flomax.  4.  Anemia of chronic disease.  Stable monitor.  5.  CAD with elevated troponin and on ACS pattern.  Likely demand ischemia from CHF, home dose beta-blocker, aspirin and statin started.  Chest pain-free.  Recent echo reviewed.  6.  Schizoaffective disorder with bipolar..  Resume home medications.  7.  Elevated D-dimer.  Nonspecific, trend, leg ultrasound stable, will monitor.  Suspicion for PE extremely low.  Was started on heparin drip at the time of admission which will be continued for 24 hours till D-dimer is trending down.  8.  History of pulmonary fibrosis.  Not on home oxygen.  Supportive care.    Family Communication  : his caregiver Robert Little 09/17/19  Code Status :  Full  Disposition Plan  : Stay in the hospital for treatment of acute hypoxic respiratory failure because due to CHF requiring IV diuretics, thereafter to group home.  Consults  : None  Procedures  :    Lower extremity Leg ultrasound. No DVT.    Recent TTE -   1. Left ventricular ejection fraction, by estimation, is 60 to 65%. The left ventricle has normal function. The left ventricle has no regional  wall motion abnormalities. There is mild concentric left ventricular hypertrophy. Left ventricular diastolic parameters are consistent with Grade II diastolic dysfunction (pseudonormalization). Elevated left atrial pressure.  2. Right ventricular systolic function is normal. The right ventricular size is normal. Tricuspid regurgitation signal is inadequate for assessing PA pressure.  3. Left atrial size was moderately dilated.  4. The mitral valve is normal in structure and function. Trivial mitral valve regurgitation. No evidence of mitral stenosis.  5. The aortic valve is normal in structure and function. Aortic valve regurgitation is not visualized. Mild aortic valve sclerosis is present,  with no evidence of aortic valve stenosis.  6. The inferior vena  cava is dilated in size with >50% respiratory variability, suggesting right atrial pressure of 8 mmHg.     DVT Prophylaxis  :  Heparin    Lab Results  Component Value Date   PLT 253 09/17/2019    Diet :  Diet Order            Diet Heart Room service appropriate? Yes; Fluid consistency: Thin; Fluid restriction: 1500 mL Fluid  Diet effective now               Inpatient Medications Scheduled Meds: . atorvastatin  20 mg Oral q1800   Continuous Infusions: . famotidine (PEPCID) IV Stopped (09/17/19 1110)  . heparin 1,250 Units/hr (09/17/19 8546)   PRN Meds:.acetaminophen, ondansetron (ZOFRAN) IV  Antibiotics  :   Anti-infectives (From admission, onward)   None          Objective:   Vitals:   09/17/19 1130 09/17/19 1145 09/17/19 1200 09/17/19 1215  BP: (!) 143/65 140/63 (!) 142/63 (!) 144/68  Pulse: 80 77 75 72  Resp: (!) 29 (!) 43 (!) 36 (!) 39  Temp:      TempSrc:      SpO2: 93% 98% (!) 88% 92%    SpO2: 92 % O2 Flow Rate (L/min): 5 L/min  Wt Readings from Last 3 Encounters:  09/07/19 75.7 kg  05/22/18 71.8 kg  05/17/18 71.8 kg     Intake/Output Summary (Last 24 hours) at 09/17/2019 1303 Last data filed at 09/17/2019 1110 Gross per 24 hour  Intake 50 ml  Output --  Net 50 ml     Physical Exam  Awake Alert, No new F.N deficits, Normal affect [place].AT,PERRAL Supple Neck,No JVD, No cervical lymphadenopathy appriciated.  Symmetrical Chest wall movement, Good air movement bilaterally, ++ rales RRR,No Gallops,Rubs or new Murmurs, No Parasternal Heave +ve B.Sounds, Abd Soft, No tenderness, No organomegaly appriciated, No rebound - guarding or rigidity. No Cyanosis, Clubbing, 2+ or edema,      Data Review:    CBC Recent Labs  Lab  09/16/19 2244 09/17/19 0827  WBC 8.7 9.2  HGB 8.1* 7.7*  HCT 26.3* 25.0*  PLT 241 253  MCV 96.3 96.5  MCH 29.7 29.7  MCHC 30.8 30.8  RDW 14.2 14.2  LYMPHSABS 0.7  --   MONOABS 0.6  --   EOSABS 0.0  --    BASOSABS 0.0  --     Chemistries  Recent Labs  Lab 09/16/19 2244 09/17/19 0827  NA 140 141  K 4.7 3.9  CL 107 104  CO2 16* 22  GLUCOSE 205* 160*  BUN 43* 44*  CREATININE 3.11* 3.05*  CALCIUM 6.9* 6.7*  AST 50*  --   ALT 48*  --   ALKPHOS 86  --   BILITOT 0.6  --   MG  --  1.1*   Recent Labs  Lab 09/16/19 2245 09/17/19 0121 09/17/19 0224 09/17/19 0827 09/17/19 0828  CRP  --   --   --  20.4*  --   DDIMER  --  5.68*  --  5.46*  --   BNP 2,353.4*  --   --   --  2,182.6*  SARSCOV2NAA  --   --  NEGATIVE  --   --     ------------------------------------------------------------------------------------------------------------------ No results for input(s): CHOL, HDL, LDLCALC, TRIG, CHOLHDL, LDLDIRECT in the last 72 hours.  No results found for: HGBA1C ------------------------------------------------------------------------------------------------------------------ No results for input(s): TSH, T4TOTAL, T3FREE, THYROIDAB in the last 72 hours.  Invalid input(s): FREET3 ------------------------------------------------------------------------------------------------------------------ No results for input(s): VITAMINB12, FOLATE, FERRITIN, TIBC, IRON, RETICCTPCT in the last 72 hours.  Coagulation profile No results for input(s): INR, PROTIME in the last 168 hours.  Recent Labs    09/17/19 0121 09/17/19 0827  DDIMER 5.68* 5.46*    Cardiac Enzymes No results for input(s): CKMB, TROPONINI, MYOGLOBIN in the last 168 hours.  Invalid input(s): CK ------------------------------------------------------------------------------------------------------------------    Component Value Date/Time   BNP 2,182.6 (H) 09/17/2019 8338    Micro Results Recent Results (from the past 240 hour(s))  SARS CORONAVIRUS 2 (TAT 6-24 HRS) Nasopharyngeal Nasopharyngeal Swab     Status: None   Collection Time: 09/17/19  2:24 AM   Specimen: Nasopharyngeal Swab  Result Value Ref Range Status    SARS Coronavirus 2 NEGATIVE NEGATIVE Final    Comment: (NOTE) SARS-CoV-2 target nucleic acids are NOT DETECTED. The SARS-CoV-2 RNA is generally detectable in upper and lower respiratory specimens during the acute phase of infection. Negative results do not preclude SARS-CoV-2 infection, do not rule out co-infections with other pathogens, and should not be used as the sole basis for treatment or other patient management decisions. Negative results must be combined with clinical observations, patient history, and epidemiological information. The expected result is Negative. Fact Sheet for Patients: SugarRoll.be Fact Sheet for Healthcare Providers: https://www.woods-mathews.com/ This test is not yet approved or cleared by the [place] FDA and  has been authorized for detection and/or diagnosis of SARS-CoV-2 by FDA under an Emergency Use Authorization (EUA). This EUA will remain  in effect (meaning this test can be used) for the duration of the COVID-19 declaration under Section 56 4(b)(1) of the Act, 21 U.S.C. section 360bbb-3(b)(1), unless the authorization is terminated or revoked sooner. Performed at [facility], [place] [address], [place], [place] [address]     Radiology Reports X-ray chest PA and lateral  Result Date: 09/05/2019 CLINICAL DATA:  Syncope EXAM: CHEST - 2 VIEW COMPARISON:  05/15/2018 FINDINGS: Increased density at the left lung base superimposed on chronic opacity. Chronic  mild interstitial prominence. No significant pleural effusion. No pneumothorax. Stable cardiomediastinal contours. No acute osseous abnormality. IMPRESSION: Left basilar atelectasis/consolidation superimposed on bibasilar fibrotic changes. Electronically Signed   By: Macy Mis M.D.   On: 09/05/2019 21:04   DG Chest Port 1 View  Result Date: 09/17/2019 CLINICAL DATA:  Shortness of breath. EXAM: PORTABLE CHEST 1 VIEW COMPARISON:  September 16, 2019.  FINDINGS: Stable cardiomediastinal silhouette. Atherosclerosis of thoracic aorta is noted. Sternotomy wires are noted. No pneumothorax is noted. No significant pleural effusion is noted. Bilateral lung opacities are noted which may represent bilateral pulmonary fibrosis or chronic interstitial lung disease, although acute superimposed edema or inflammation cannot be excluded. Bony thorax is unremarkable. IMPRESSION: Aortic atherosclerosis. Bilateral lung opacities are noted which may represent pulmonary fibrosis or chronic interstitial lung disease, although acute superimposed edema or inflammation cannot be excluded. Aortic Atherosclerosis (ICD10-I70.0). Electronically Signed   By: Marijo Conception M.D.   On: 09/17/2019 08:23   DG Chest Port 1 View  Result Date: 09/16/2019 CLINICAL DATA:  79 year old male with shortness of breath. EXAM: PORTABLE CHEST 1 VIEW COMPARISON:  Chest radiograph dated 09/05/2019. FINDINGS: There is diffuse interstitial coarsening and chronic bronchitic changes. There is however overall interval increase in the interstitial densities compared to the prior radiograph which may represent progression of underlying fibrosis or superimposition of pneumonia, possibly viral or atypical. Clinical correlation is recommended. No focal consolidation, pleural effusion, or pneumothorax. Stable cardiomediastinal silhouette. Median sternotomy wires and CABG vascular clips. No acute osseous pathology. IMPRESSION: Interval increase in the interstitial densities compared to the prior radiograph may represent progression of underlying fibrosis or superimposition of pneumonia. Clinical correlation is recommended. Electronically Signed   By: Anner Crete M.D.   On: 09/16/2019 23:24   ECHOCARDIOGRAM COMPLETE  Result Date: 09/06/2019    ECHOCARDIOGRAM REPORT   Patient Name:   JERAN HILTZ Date of Exam: 09/06/2019 Medical Rec #:  [id number]    Height:       68.0 in Accession #:    [id number]   Weight:        158.5 lb Date of Birth:  [birth year]    BSA:          1.851 m Patient Age:    70 years     BP:           132/65 mmHg Patient Gender: M            HR:           67 bpm. Exam Location:  Inpatient Procedure: 2D Echo Indications:    Syncope R55  History:        Patient has prior history of Echocardiogram examinations and                 Patient has no prior history of Echocardiogram examinations.                 CAD, Prior CABG; Risk Factors:Hypertension.  Sonographer:    Mikki Santee RDCS (AE) Referring Phys: [place]  1. Left ventricular ejection fraction, by estimation, is 60 to 65%. The left ventricle has normal function. The left ventricle has no regional wall motion abnormalities. There is mild concentric left ventricular hypertrophy. Left ventricular diastolic parameters are consistent with Grade II diastolic dysfunction (pseudonormalization). Elevated left atrial pressure.  2. Right ventricular systolic function is normal. The right ventricular size is normal. Tricuspid regurgitation signal is inadequate for assessing PA pressure.  3. Left atrial  size was moderately dilated.  4. The mitral valve is normal in structure and function. Trivial mitral valve regurgitation. No evidence of mitral stenosis.  5. The aortic valve is normal in structure and function. Aortic valve regurgitation is not visualized. Mild aortic valve sclerosis is present, with no evidence of aortic valve stenosis.  6. The inferior vena cava is dilated in size with >50% respiratory variability, suggesting right atrial pressure of 8 mmHg. FINDINGS  Left Ventricle: Left ventricular ejection fraction, by estimation, is 60 to 65%. The left ventricle has normal function. The left ventricle has no regional wall motion abnormalities. The left ventricular internal cavity size was normal in size. There is  mild concentric left ventricular hypertrophy. Left ventricular diastolic parameters are consistent with Grade II diastolic  dysfunction (pseudonormalization). Elevated left atrial pressure. Right Ventricle: The right ventricular size is normal. No increase in right ventricular wall thickness. Right ventricular systolic function is normal. Tricuspid regurgitation signal is inadequate for assessing PA pressure. Left Atrium: Left atrial size was moderately dilated. Right Atrium: Right atrial size was normal in size. Pericardium: There is no evidence of pericardial effusion. Mitral Valve: The mitral valve is normal in structure and function. Normal mobility of the mitral valve leaflets. Mild mitral annular calcification. Trivial mitral valve regurgitation. No evidence of mitral valve stenosis. Tricuspid Valve: The tricuspid valve is normal in structure. Tricuspid valve regurgitation is not demonstrated. No evidence of tricuspid stenosis. Aortic Valve: The aortic valve is normal in structure and function. Aortic valve regurgitation is not visualized. Mild aortic valve sclerosis is present, with no evidence of aortic valve stenosis. Pulmonic Valve: The pulmonic valve was normal in structure. Pulmonic valve regurgitation is trivial. No evidence of pulmonic stenosis. Aorta: The aortic root is normal in size and structure. Venous: The inferior vena cava is dilated in size with greater than 50% respiratory variability, suggesting right atrial pressure of 8 mmHg. IAS/Shunts: No atrial level shunt detected by color flow Doppler.  LEFT VENTRICLE PLAX 2D LVIDd:         4.50 cm  Diastology LVIDs:         3.10 cm  LV e' lateral:   8.70 cm/s LV PW:         1.20 cm  LV E/e' lateral: 11.5 LV IVS:        1.30 cm  LV e' medial:    5.87 cm/s LVOT diam:     1.95 cm  LV E/e' medial:  17.0 LV SV:         51 LV SV Index:   28 LVOT Area:     2.99 cm  RIGHT VENTRICLE RV S prime:     10.30 cm/s TAPSE (M-mode): 1.7 cm LEFT ATRIUM             Index       RIGHT ATRIUM           Index LA diam:        4.90 cm 2.65 cm/m  RA Area:     15.40 cm LA Vol (A2C):   55.7 ml  30.08 ml/m RA Volume:   30.30 ml  16.37 ml/m LA Vol (A4C):   68.2 ml 36.84 ml/m LA Biplane Vol: 65.2 ml 35.22 ml/m  AORTIC VALVE LVOT Vmax:   76.30 cm/s LVOT Vmean:  53.700 cm/s LVOT VTI:    0.172 m  AORTA Ao Root diam: 3.00 cm MITRAL VALVE MV Area (PHT): 3.89 cm    SHUNTS MV Decel Time: 195  msec    Systemic VTI:  0.17 m MV E velocity: 99.90 cm/s  Systemic Diam: 1.95 cm MV A velocity: 73.50 cm/s MV E/A ratio:  1.36 Mihai Croitoru MD Electronically signed by Sanda Klein MD Signature Date/Time: 09/06/2019/2:51:24 PM    Final    VAS [place] LOWER EXTREMITY VENOUS (DVT) (ONLY [facility] & [facility])  Result Date: 09/17/2019  Lower Venous DVTStudy Indications: Edema.  Limitations: Poor ultrasound/tissue interface and body habitus. Comparison Study: No prior study Performing Technologist: Maudry Mayhew MHA, RDMS, RVT, RDCS  Examination Guidelines: A complete evaluation includes B-mode imaging, spectral Doppler, color Doppler, and power Doppler as needed of all accessible portions of each vessel. Bilateral testing is considered an integral part of a complete examination. Limited examinations for reoccurring indications may be performed as noted. The reflux portion of the exam is performed with the patient in reverse Trendelenburg.  +---------+---------------+---------+-----------+----------+--------------+ RIGHT    CompressibilityPhasicitySpontaneityPropertiesThrombus Aging +---------+---------------+---------+-----------+----------+--------------+ CFV      Full           No       Yes                                 +---------+---------------+---------+-----------+----------+--------------+ SFJ      Full                                                        +---------+---------------+---------+-----------+----------+--------------+ FV Prox  Full                                                        +---------+---------------+---------+-----------+----------+--------------+ FV Mid   Full                                                         +---------+---------------+---------+-----------+----------+--------------+ FV DistalFull                                                        +---------+---------------+---------+-----------+----------+--------------+ PFV      Full                                                        +---------+---------------+---------+-----------+----------+--------------+ POP      Full           No       Yes                                 +---------+---------------+---------+-----------+----------+--------------+ PTV      Full                                                        +---------+---------------+---------+-----------+----------+--------------+  PERO     Full                                                        +---------+---------------+---------+-----------+----------+--------------+   +---------+---------------+---------+-----------+----------+--------------+ LEFT     CompressibilityPhasicitySpontaneityPropertiesThrombus Aging +---------+---------------+---------+-----------+----------+--------------+ CFV      Full           No       Yes                                 +---------+---------------+---------+-----------+----------+--------------+ SFJ      Full                                                        +---------+---------------+---------+-----------+----------+--------------+ FV Prox  Full                                                        +---------+---------------+---------+-----------+----------+--------------+ FV Mid   Full                                                        +---------+---------------+---------+-----------+----------+--------------+ FV DistalFull                                                        +---------+---------------+---------+-----------+----------+--------------+ PFV      Full                                                         +---------+---------------+---------+-----------+----------+--------------+ POP      Full           No       Yes                                 +---------+---------------+---------+-----------+----------+--------------+ PTV      Full                                                        +---------+---------------+---------+-----------+----------+--------------+ PERO     Full                                                        +---------+---------------+---------+-----------+----------+--------------+  Summary: RIGHT: - There is no evidence of deep vein thrombosis in the lower extremity.  - No cystic structure found in the popliteal fossa.  LEFT: - There is no evidence of deep vein thrombosis in the lower extremity.  - No cystic structure found in the popliteal fossa.  Bilateral lower extremity venous flow is pulsatile, suggestive of possible elevated right heart pressure. *See table(s) above for measurements and observations.    Preliminary     Time Spent in minutes  30   Lala Lund M.D on 09/17/2019 at 1:03 PM  To page go to [email or web address] - password [facility]

## 2019-09-17 NOTE — ED Notes (Signed)
Pt's 02 saturation decreased to 75% while eating lunch. Pt repositioned with HOB elevated and placed on NRB@15L . 02 saturation increased to 95%, but pt still complaining of SOB w/ tachypnea. Pt taken off NRB after 02 sat was >95%. [place] 02 increased to 7L. Dr. Candiss Norse notified, new orders for chest xray and repeat troponin placed.

## 2019-09-17 NOTE — ED Notes (Signed)
Report given to [employer]

## 2019-09-17 NOTE — H&P (Signed)
History and Physical    Robert Little HFW:[id number] DOB: 06-Oct-1940 DOA: 09/16/2019  PCP: Patient, No Pcp Per   Patient coming from: Group home   Chief Complaint: Shortness of breath   HPI: Robert Little is a 79 y.o. male with medical history significant for chronic anemia, pulmonary fibrosis, hypertension, coronary artery disease, chronic diastolic CHF, and schizoaffective disorder, presenting to the emergency department with progressive shortness of breath over the past 24 hours.  Patient reports that he has experienced slowly worsening shortness of breath, becoming severe even at rest shortly prior to arrival in the ED.  He denies any chest pain, has not noticed any fevers or chills, reports some recent leg swelling, and a mild cough without much sputum production.  He denies chest pain, nausea, or diaphoresis.  ED Course: Upon arrival to the ED, patient is found to be afebrile, saturating low 90s on 4 L/min of supplemental oxygen, tachypneic in the mid 30s, and with stable blood pressure.  EKG features sinus rhythm and chest x-ray notable for increased interstitial densities.  Chemistry panel notable for creatinine of 3.11, up from 2.6 earlier this month.  CBC features a stable normocytic anemia with hemoglobin 8.1.  Troponin was elevated to 80 and BNP elevated to 2300.  D-dimer is elevated 5.68.  COVID-19 antigen was negative and PCR ordered.  Patient was given 40 mg of Lasix, albuterol, type and screen was performed, VQ scan and lower extremity venous Dopplers were ordered, and hospitalists consulted for admission.  Review of Systems:  All other systems reviewed and apart from HPI, are negative.  Past Medical History:  Diagnosis Date  . Anemia   . Coronary artery disease   . GERD (gastroesophageal reflux disease)   . Hypertension   . Pulmonary fibrosis ([place])   . Schizo affective schizophrenia [facility])     Past Surgical History:  Procedure Laterality Date  . CORONARY ARTERY BYPASS GRAFT        reports that he has quit smoking. His smoking use included cigarettes. He has never used smokeless tobacco. He reports previous alcohol use. He reports previous drug use.  No Known Allergies  Family History  Problem Relation Age of Onset  . Diabetes Mellitus II Mother   . Diabetes Mellitus II Maternal Grandmother   . Heart disease Maternal Grandmother      Prior to Admission medications   Medication Sig Start Date End Date Taking? Authorizing Provider  acetaminophen (TYLENOL) 325 MG tablet Take 2 tablets (650 mg total) by mouth every 6 (six) hours as needed for mild pain (or Fever >/= 101). 05/18/18   Debbe Odea, MD  aspirin 325 MG tablet Take 325 mg by mouth daily.    [provider]  atenolol (TENORMIN) 25 MG tablet Take 25 mg by mouth daily.    [provider]  atorvastatin (LIPITOR) 20 MG tablet Take 20 mg by mouth at bedtime.    [provider]  benztropine (COGENTIN) 0.5 MG tablet Take 0.5 mg by mouth 2 (two) times daily.    [provider]  calcium gluconate 500 MG tablet Take 1 tablet (500 mg total) by mouth 2 (two) times daily. 09/07/19   Patrecia Pour, MD  clonazePAM (KLONOPIN) 0.5 MG tablet Take 0.5 mg by mouth 2 (two) times daily.     [provider]  Dexlansoprazole (DEXILANT) 30 MG capsule Take 30 mg by mouth daily.    [provider]  Multiple Vitamins-Minerals ([employer] SPECTRAVITE PO) Take 1 tablet by  mouth daily.    [provider]  tamsulosin (FLOMAX) 0.4 MG CAPS capsule Take 1 capsule (0.4 mg total) by mouth daily after supper. Patient taking differently: Take 0.4 mg by mouth daily.  05/18/18   Debbe Odea, MD    Physical Exam: Vitals:   09/17/19 0200 09/17/19 0215 09/17/19 0230 09/17/19 0245  BP: 128/67 (!) 114/58 (!) 111/49 (!) 116/51  Pulse: 66 66 66 62  Resp: (!) 29 (!) 34 (!) 35 (!) 32  Temp:      TempSrc:      SpO2: 96% (!) 89% 91% 93%    Constitutional: NAD, tachypneic   Eyes:  PERTLA, lids and conjunctivae normal ENMT: Mucous membranes are moist. Posterior pharynx clear of any exudate or lesions.   Neck: normal, supple, no masses, no thyromegaly Respiratory: Tachypneic at rest, wheezing. No pallor or cyanosis.  Cardiovascular: S1 & S2 heard, regular rate and rhythm. Pitting edema to lower legs Rt >> Lt. Abdomen: No distension, no tenderness, soft. Bowel sounds active.  Musculoskeletal: no clubbing / cyanosis. No joint deformity upper and lower extremities.   Skin: no significant rashes, lesions, ulcers. Warm, dry, well-perfused. Neurologic: No gross facial asymmetyr. Sensation intact. Moving all exremities.  Psychiatric: Sleeping, wakes to voice and oriented to person, place, and situation. Calm and cooperative.    Labs and Imaging on Admission: I have personally reviewed following labs and imaging studies  CBC: Recent Labs  Lab 09/16/19 2244  WBC 8.7  NEUTROABS 7.2  HGB 8.1*  HCT 26.3*  MCV 96.3  PLT 035   Basic Metabolic Panel: Recent Labs  Lab 09/16/19 2244  NA 140  K 4.7  CL 107  CO2 16*  GLUCOSE 205*  BUN 43*  CREATININE 3.11*  CALCIUM 6.9*   GFR: Estimated Creatinine Clearance: 19.2 mL/min (A) (by C-G formula based on SCr of 3.11 mg/dL (H)). Liver Function Tests: Recent Labs  Lab 09/16/19 2244  AST 50*  ALT 48*  ALKPHOS 86  BILITOT 0.6  PROT 6.4*  ALBUMIN 2.9*   No results for input(s): LIPASE, AMYLASE in the last 168 hours. No results for input(s): AMMONIA in the last 168 hours. Coagulation Profile: No results for input(s): INR, PROTIME in the last 168 hours. Cardiac Enzymes: No results for input(s): CKTOTAL, CKMB, CKMBINDEX, TROPONINI in the last 168 hours. BNP (last 3 results) No results for input(s): PROBNP in the last 8760 hours. HbA1C: No results for input(s): HGBA1C in the last 72 hours. CBG: Recent Labs  Lab 09/16/19 2241  GLUCAP 221*   Lipid Profile: No results for input(s): CHOL, HDL, LDLCALC, TRIG,  CHOLHDL, LDLDIRECT in the last 72 hours. Thyroid Function Tests: No results for input(s): TSH, T4TOTAL, FREET4, T3FREE, THYROIDAB in the last 72 hours. Anemia Panel: No results for input(s): VITAMINB12, FOLATE, FERRITIN, TIBC, IRON, RETICCTPCT in the last 72 hours. Urine analysis:    Component Value Date/Time   COLORURINE YELLOW 09/05/2019 1819   APPEARANCEUR CLEAR 09/05/2019 1819   LABSPEC 1.009 09/05/2019 1819   PHURINE 6.0 09/05/2019 1819   GLUCOSEU NEGATIVE 09/05/2019 1819   HGBUR NEGATIVE 09/05/2019 1819   BILIRUBINUR NEGATIVE 09/05/2019 1819   KETONESUR NEGATIVE 09/05/2019 1819   PROTEINUR NEGATIVE 09/05/2019 1819   NITRITE NEGATIVE 09/05/2019 1819   LEUKOCYTESUR NEGATIVE 09/05/2019 1819   Sepsis Labs: @LABRCNTIP (procalcitonin:4,lacticidven:4) )No results found for this or any previous visit (from the past 240 hour(s)).   Radiological Exams on Admission: DG Chest Port 1 View  Result Date: 09/16/2019 CLINICAL DATA:  79 year old male with shortness of breath. EXAM: PORTABLE CHEST 1 VIEW COMPARISON:  Chest radiograph dated 09/05/2019. FINDINGS: There is diffuse interstitial coarsening and chronic bronchitic changes. There is however overall interval increase in the interstitial densities compared to the prior radiograph which may represent progression of underlying fibrosis or superimposition of pneumonia, possibly viral or atypical. Clinical correlation is recommended. No focal consolidation, pleural effusion, or pneumothorax. Stable cardiomediastinal silhouette. Median sternotomy wires and CABG vascular clips. No acute osseous pathology. IMPRESSION: Interval increase in the interstitial densities compared to the prior radiograph may represent progression of underlying fibrosis or superimposition of pneumonia. Clinical correlation is recommended. Electronically Signed   By: Anner Crete M.D.   On: 09/16/2019 23:24    EKG: Independently reviewed. Sinus rhythm.   Assessment/Plan    1. Acute hypoxic respiratory failure  - Presents with SOB worsening over the past 24 hours with non-productive cough, leg swelling, and no chest pain  - He is found to be afebrile, RR 30s at rest, with new 2-4 Lpm supplemental O2 requirement, increased opacities bilaterally on CXR, BNP 2353, and d-dimer 5.68  - COVID Ag negative and pcr test pending  - He denies chest pain but right leg is much more swollen than left and DVT/PE is a concern  - He was given IV Lasix in ED, V/Q scan and LE dopplers were ordered, and he was started on supplemental O2  - Plan to start IV heparin pending V/Q scan and LE dopplers, continue diuresis, he has some wheezing and will be given albuterol, continue supplemental O2 as needed   2. Acute on chronic diastolic CHF  - Presents with SOB, found to have pitting edema of right leg > left, new supplemental O2 requirement, increased bilateral opacities on CXR, and RR in 30s while at rest  - He had TTE earlier this month with EF 60-65% and grade II diastolic dysfunction  - He was given Lasix 40 mg IV and supplemental O2 in ED  - Check weight, continue diuresis with IV Lasix, follow I/O's, monitor renal function and electrolytes   3. Acute kidney injury superimposed on CKD IV  - SCr is 3.11 in ED, up from 2.6 ten days earlier  - He appears hypervolemic in ED and is being diuresed  - Check UA and urine chemistries, renally-dose medications, and repeat chem panel in am   4. CAD; elevated troponin  - Patient has history of CAD and HS troponin was 80 then 102 two and 1/2 hours later in ED  - He denies chest pain and no acute ischemic features noted on EKG  - Likely demand ischemia in setting of acute respiratory failure, will continue cardiac monitoring, repeat troponin and EKG, and continue statin   5. Anemia  - Hgb is 8.1 on admission, similar to priors with no active bleeding  - Type and screen done in ED  - Monitor closely while on anticoagulation    6.  Schizoaffective disorder  - He is calm and cooperative in ED, pharmacy medication-reconciliation is pending     DVT prophylaxis: IV heparin pending LE dopplers and V/Q scan  Code Status: Full  Family Communication:  Disposition Plan: Likely back to group home in 2-3 days pending improvement in respiratory status  Consults called: None  Admission status: Inpatient. Multiple acute problems in this elderly and chronically-ill patient include AKI superimposed on advanced CKD, acute hypoxic respiratory failure on top of chronic pulmonary fibrosis, with RR in 30s while at rest, significant elevation  in d-dimer and asymmetric leg swelling concerning for DVT/PE. He has a high severity of acute illness and is at high-risk for life-threatening complications due to advance chronic disease and age.    Vianne Bulls, MD [facility] Pager: See [email or web address]  If 7AM-7PM, please contact the daytime attending [email or web address]  09/17/2019, 3:31 AM

## 2019-09-17 NOTE — ED Notes (Signed)
Repeat CXR and troponin completed. Pt resting with HOB elevated. 2nd IV placed and 02 increased to 8L [place]. Pt A/Ox4 at this time.

## 2019-09-17 NOTE — Progress Notes (Addendum)
Pharmacy Consult  89 yom with history of schizoaffective disorder with bipolar on [place] PTA. Pharmacy consulted to enter order for Robert Little if renal function appropriate, as MD reports patient is 2 weeks overdue for his dose. SCr 3.11 on admit, now down to 3.05, CrCl~20.  Plan: Hold Robert Little for now, as use is not recommended with CrCl <50 Monitor renal function Will d/c consult for now per discussion with Dr. Bayard Males, PharmD, BCPS Please check AMION for all [place] contact numbers Clinical Pharmacist 09/17/2019 1:32 PM

## 2019-09-17 NOTE — ED Notes (Signed)
Tele   bfast ordered  

## 2019-09-17 NOTE — Progress Notes (Signed)
ANTICOAGULATION CONSULT NOTE - Initial Consult  Pharmacy Consult for Heparin Indication: VTE  No Known Allergies  Patient Measurements:    Vital Signs: Temp: 97.5 F (36.4 C) (03/14 2237) Temp Source: Oral (03/14 2237) BP: 116/51 (03/15 0245) Pulse Rate: 62 (03/15 0245)  Labs: Recent Labs    09/16/19 2244 09/17/19 0121  HGB 8.1*  --   HCT 26.3*  --   PLT 241  --   CREATININE 3.11*  --   TROPONINIHS 80* 102*    Estimated Creatinine Clearance: 19.2 mL/min (A) (by C-G formula based on SCr of 3.11 mg/dL (H)).   Medical History: Past Medical History:  Diagnosis Date  . Anemia   . Coronary artery disease   . GERD (gastroesophageal reflux disease)   . Hypertension   . Pulmonary fibrosis ([place])   . Schizo affective schizophrenia ([place])     Medications:  No current facility-administered medications on file prior to encounter.   Current Outpatient Medications on File Prior to Encounter  Medication Sig Dispense Refill  . acetaminophen (TYLENOL) 325 MG tablet Take 2 tablets (650 mg total) by mouth every 6 (six) hours as needed for mild pain (or Fever >/= 101).    Marland Kitchen aspirin 325 MG tablet Take 325 mg by mouth daily.    Marland Kitchen atenolol (TENORMIN) 25 MG tablet Take 25 mg by mouth daily.    Marland Kitchen atorvastatin (LIPITOR) 20 MG tablet Take 20 mg by mouth at bedtime.    . benztropine (COGENTIN) 0.5 MG tablet Take 0.5 mg by mouth 2 (two) times daily.    . calcium gluconate 500 MG tablet Take 1 tablet (500 mg total) by mouth 2 (two) times daily. 60 tablet 0  . clonazePAM (KLONOPIN) 0.5 MG tablet Take 0.5 mg by mouth 2 (two) times daily.     Marland Kitchen Dexlansoprazole (DEXILANT) 30 MG capsule Take 30 mg by mouth daily.    . Multiple Vitamins-Minerals ([employer]S SPECTRAVITE PO) Take 1 tablet by mouth daily.    . tamsulosin (FLOMAX) 0.4 MG CAPS capsule Take 1 capsule (0.4 mg total) by mouth daily after supper. (Patient taking differently: Take 0.4 mg by mouth daily. ) 30 capsule 0     Assessment: 79 y.o.  male with with suspected PE/DVT for heparin  Goal of Therapy:  Heparin level 0.3-0.7 units/ml Monitor platelets by anticoagulation protocol: Yes   Plan:  Heparin 4000 units IV bolus, then start heparin 1250 units/hr Check heparin level in 8 hours.   Caryl Pina 09/17/2019,3:31 AM

## 2019-09-17 NOTE — ED Notes (Signed)
Care Taker [name] would like an update [phone]

## 2019-09-17 NOTE — Progress Notes (Signed)
Bilateral lower extremity venous duplex completed. Refer to "CV Proc" under chart review to view preliminary results.  09/17/2019 9:12 AM Kelby Aline., MHA, RVT, RDCS, RDMS

## 2019-09-17 NOTE — ED Notes (Signed)
Condom cath placed on patient

## 2019-09-17 NOTE — ED Notes (Signed)
Dr Leonides Schanz and Dr Langston Masker informed POC covid test neg

## 2019-09-17 NOTE — Plan of Care (Signed)

## 2019-09-17 NOTE — Progress Notes (Signed)
ANTICOAGULATION CONSULT NOTE  Pharmacy Consult for Heparin Indication: VTE r/o  No Known Allergies  Patient Measurements:    Vital Signs: BP: 144/68 (03/15 1215) Pulse Rate: 72 (03/15 1215)  Labs: Recent Labs    09/16/19 2244 09/17/19 0121 09/17/19 0449 09/17/19 0827 09/17/19 1209  HGB 8.1*  --   --  7.7*  --   HCT 26.3*  --   --  25.0*  --   PLT 241  --   --  253  --   HEPARINUNFRC  --   --   --   --  0.72*  CREATININE 3.11*  --   --  3.05*  --   TROPONINIHS 80* 102* 133*  --   --     Estimated Creatinine Clearance: 19.6 mL/min (A) (by C-G formula based on SCr of 3.05 mg/dL (H)).   Medical History: Past Medical History:  Diagnosis Date  . Anemia   . Coronary artery disease   . GERD (gastroesophageal reflux disease)   . Hypertension   . Pulmonary fibrosis ([place])   . Schizo affective schizophrenia ([place])     Medications:  No current facility-administered medications on file prior to encounter.   Current Outpatient Medications on File Prior to Encounter  Medication Sig Dispense Refill  . acetaminophen (TYLENOL) 325 MG tablet Take 2 tablets (650 mg total) by mouth every 6 (six) hours as needed for mild pain (or Fever >/= 101).    Marland Kitchen aspirin 325 MG tablet Take 325 mg by mouth daily.    Marland Kitchen atenolol (TENORMIN) 25 MG tablet Take 25 mg by mouth daily.    Marland Kitchen atorvastatin (LIPITOR) 20 MG tablet Take 20 mg by mouth at bedtime.    . benztropine (COGENTIN) 0.5 MG tablet Take 0.5 mg by mouth 2 (two) times daily.    . calcium gluconate 500 MG tablet Take 1 tablet (500 mg total) by mouth 2 (two) times daily. 60 tablet 0  . clonazePAM (KLONOPIN) 0.5 MG tablet Take 0.5 mg by mouth 2 (two) times daily.     Marland Kitchen Dexlansoprazole (DEXILANT) 30 MG capsule Take 30 mg by mouth daily.    . Multiple Vitamins-Minerals ([employer]S SPECTRAVITE PO) Take 1 tablet by mouth daily.    . tamsulosin (FLOMAX) 0.4 MG CAPS capsule Take 1 capsule (0.4 mg total) by mouth daily after supper. (Patient taking  differently: Take 0.4 mg by mouth daily. ) 30 capsule 0     Assessment: 79 y.o. male with with suspected VTE, d-dimer 5.68, continuing on heparin. V/Q scan pending. LE dopplers negative for DVT. Patient is not on anticoagulation PTA. Noted AKI.  Initial heparin level slightly high at 0.72. Hg down a bit to 7.7, plt wnl. No bleeding or issues with infusion per discussion with RN.  Goal of Therapy:  Heparin level 0.3-0.7 units/ml Monitor platelets by anticoagulation protocol: Yes   Plan:  Decrease heparin to 1200 units/hr Check heparin level in 8 hours Monitor daily heparin level and CBC, s/sx bleeding F/u V/Q scan   Arturo Morton, PharmD, BCPS Please check AMION for all [place] contact numbers Clinical Pharmacist 09/17/2019 1:14 PM

## 2019-09-18 LAB — PROCALCITONIN: Procalcitonin: 0.4 ng/mL

## 2019-09-18 LAB — IRON AND TIBC
Iron: 25 ug/dL — ABNORMAL LOW (ref 45–182)
Saturation Ratios: 12 % — ABNORMAL LOW (ref 17.9–39.5)
TIBC: 216 ug/dL — ABNORMAL LOW (ref 250–450)
UIBC: 191 ug/dL

## 2019-09-18 LAB — CBC
HCT: 24.2 % — ABNORMAL LOW (ref 39.0–52.0)
Hemoglobin: 7.8 g/dL — ABNORMAL LOW (ref 13.0–17.0)
MCH: 29.4 pg (ref 26.0–34.0)
MCHC: 32.2 g/dL (ref 30.0–36.0)
MCV: 91.3 fL (ref 80.0–100.0)
Platelets: 261 10*3/uL (ref 150–400)
RBC: 2.65 MIL/uL — ABNORMAL LOW (ref 4.22–5.81)
RDW: 14.1 % (ref 11.5–15.5)
WBC: 8.5 10*3/uL (ref 4.0–10.5)
nRBC: 0 % (ref 0.0–0.2)

## 2019-09-18 LAB — BASIC METABOLIC PANEL
Anion gap: 15 (ref 5–15)
BUN: 52 mg/dL — ABNORMAL HIGH (ref 8–23)
CO2: 23 mmol/L (ref 22–32)
Calcium: 6.8 mg/dL — ABNORMAL LOW (ref 8.9–10.3)
Chloride: 103 mmol/L (ref 98–111)
Creatinine, Ser: 3.21 mg/dL — ABNORMAL HIGH (ref 0.61–1.24)
GFR calc Af Amer: 20 mL/min — ABNORMAL LOW (ref >60)
GFR calc non Af Amer: 18 mL/min — ABNORMAL LOW (ref >60)
Glucose, Bld: 242 mg/dL — ABNORMAL HIGH (ref 70–99)
Potassium: 4.2 mmol/L (ref 3.5–5.1)
Sodium: 141 mmol/L (ref 135–145)

## 2019-09-18 LAB — C-REACTIVE PROTEIN: CRP: 26.3 mg/dL — ABNORMAL HIGH (ref <1.0)

## 2019-09-18 LAB — RETICULOCYTES
Immature Retic Fract: 30.5 % — ABNORMAL HIGH (ref 2.3–15.9)
RBC.: 2.85 MIL/uL — ABNORMAL LOW (ref 4.22–5.81)
Retic Count, Absolute: 95 10*3/uL (ref 19.0–186.0)
Retic Ct Pct: 3.4 % — ABNORMAL HIGH (ref 0.4–3.1)

## 2019-09-18 LAB — D-DIMER, QUANTITATIVE: D-Dimer, Quant: 4.72 ug/mL-FEU — ABNORMAL HIGH (ref 0.00–0.50)

## 2019-09-18 LAB — FERRITIN: Ferritin: 1124 ng/mL — ABNORMAL HIGH (ref 24–336)

## 2019-09-18 LAB — HEPARIN LEVEL (UNFRACTIONATED): Heparin Unfractionated: 0.63 IU/mL (ref 0.30–0.70)

## 2019-09-18 LAB — FOLATE: Folate: 10.7 ng/mL (ref >5.9)

## 2019-09-18 LAB — MRSA PCR SCREENING: MRSA by PCR: NEGATIVE

## 2019-09-18 LAB — MAGNESIUM: Magnesium: 1 mg/dL — ABNORMAL LOW (ref 1.7–2.4)

## 2019-09-18 LAB — VITAMIN B12: Vitamin B-12: 468 pg/mL (ref 180–914)

## 2019-09-18 LAB — BRAIN NATRIURETIC PEPTIDE: B Natriuretic Peptide: 2505.2 pg/mL — ABNORMAL HIGH (ref 0.0–100.0)

## 2019-09-18 MED ORDER — SODIUM CHLORIDE 0.9 % IV SOLN
3.0000 g | Freq: Two times a day (BID) | INTRAVENOUS | Status: DC
  Administered 2019-09-18 – 2019-09-19 (×3): 3 g via INTRAVENOUS
  Filled 2019-09-18: qty 8
  Filled 2019-09-18 (×3): qty 3

## 2019-09-18 MED ORDER — HEPARIN SODIUM (PORCINE) 5000 UNIT/ML IJ SOLN
5000.0000 [IU] | Freq: Three times a day (TID) | INTRAMUSCULAR | Status: DC
  Administered 2019-09-18 – 2019-09-21 (×9): 5000 [IU] via SUBCUTANEOUS
  Filled 2019-09-18 (×9): qty 1

## 2019-09-18 MED ORDER — FERROUS SULFATE 325 (65 FE) MG PO TABS
325.0000 mg | ORAL_TABLET | Freq: Three times a day (TID) | ORAL | Status: DC
  Administered 2019-09-18 – 2019-09-21 (×9): 325 mg via ORAL
  Filled 2019-09-18 (×9): qty 1

## 2019-09-18 MED ORDER — FAMOTIDINE IN NACL 20-0.9 MG/50ML-% IV SOLN
20.0000 mg | INTRAVENOUS | Status: DC
  Administered 2019-09-19: 20 mg via INTRAVENOUS
  Filled 2019-09-18: qty 50

## 2019-09-18 MED ORDER — ISOSORBIDE MONONITRATE ER 30 MG PO TB24
30.0000 mg | ORAL_TABLET | Freq: Every day | ORAL | Status: DC
  Administered 2019-09-18 – 2019-09-19 (×2): 30 mg via ORAL
  Filled 2019-09-18 (×2): qty 1

## 2019-09-18 MED ORDER — DEXAMETHASONE SODIUM PHOSPHATE 4 MG/ML IJ SOLN
4.0000 mg | INTRAMUSCULAR | Status: DC
  Administered 2019-09-18 – 2019-09-19 (×2): 4 mg via INTRAVENOUS
  Filled 2019-09-18 (×3): qty 1

## 2019-09-18 MED ORDER — MAGNESIUM SULFATE 4 GM/100ML IV SOLN
4.0000 g | Freq: Once | INTRAVENOUS | Status: AC
  Administered 2019-09-18: 4 g via INTRAVENOUS
  Filled 2019-09-18: qty 100

## 2019-09-18 MED ORDER — RESOURCE THICKENUP CLEAR PO POWD
ORAL | Status: DC | PRN
  Filled 2019-09-18: qty 125

## 2019-09-18 MED ORDER — FUROSEMIDE 10 MG/ML IJ SOLN
80.0000 mg | Freq: Once | INTRAMUSCULAR | Status: AC
  Administered 2019-09-18: 80 mg via INTRAVENOUS
  Filled 2019-09-18: qty 8

## 2019-09-18 NOTE — Evaluation (Signed)
Physical Therapy Evaluation Patient Details Name: Robert Little MRN: [id number] DOB: 1941-06-23 Today's Date: 09/18/2019   History of Present Illness  79 y.o. male with medical history significant for chronic anemia, pulmonary fibrosis, hypertension, coronary artery disease, chronic diastolic CHF, and schizoaffective disorder, presenting to the emergency department with progressive shortness of breath long with severe orthopnea starting 1 to 2 days prior to hospital visit, in the hospital work-up suggestive of CHF and acute hypoxic respiratory failure and he was admitted 09/16/19  Clinical Impression  PTA pt living in group home with 3 steps to enter. Caregiver present 24/hr/ Pt reports supervision for ambulation in driveway with RW, and independence in bathing and dressing bur reports previous fall in tub. Pt is currently limited in safe mobility by increased oxygen demand (see General Comments) in presence of generalized weakness. Pt is min guard for transfers and minA for ambulation of 120 feet with RW. PT recommending HHPT at discharge for strengthening and balance training. PT will continue to follow acutely.     Follow Up Recommendations Home health PT    Equipment Recommendations  None recommended by PT       Precautions / Restrictions Precautions Precautions: Fall Precaution Comments: (10 L O2 via HFNC) Restrictions Weight Bearing Restrictions: No      Mobility  Bed Mobility               General bed mobility comments: OOB in recliner on entry  Transfers Overall transfer level: Needs assistance Equipment used: Rolling walker (2 wheeled) Transfers: Sit to/from Stand Sit to Stand: Min guard         General transfer comment: min guard for safety, cues for hand placement   Ambulation/Gait Ambulation/Gait assistance: Min assist Gait Distance (Feet): 120 Feet Assistive device: Rolling walker (2 wheeled)   Gait velocity: slowed Gait velocity interpretation: <1.8  ft/sec, indicate of risk for recurrent falls General Gait Details: min A for steadying, with slow, mildly unsteady gait, no overt LoB, vc for navigation around obstacles in hallway        Balance Overall balance assessment: Needs assistance Sitting-balance support: No upper extremity supported;Feet supported Sitting balance-Leahy Scale: Good     Standing balance support: Bilateral upper extremity supported Standing balance-Leahy Scale: Fair                               Pertinent Vitals/Pain Pain Assessment: No/denies pain    Home Living Family/patient expects to be discharged to:: Group home Living Arrangements: Group Home Available Help at Discharge: Other (Comment);Available 24 hours/day(caretaker) Type of Home: Group Home Home Access: Stairs to enter Entrance Stairs-Rails: Right;Left Entrance Stairs-Number of Steps: 3 Home Layout: One level Home Equipment: Tub bench;Grab bars - tub/shower;Hand held shower head;Walker - 2 wheels      Prior Function Level of Independence: Needs assistance   Gait / Transfers Assistance Needed: amb with RW and supervision  ADL's / Homemaking Assistance Needed: assist from caregivers per pt           Extremity/Trunk Assessment   Upper Extremity Assessment Upper Extremity Assessment: Overall WFL for tasks assessed;Generalized weakness    Lower Extremity Assessment Lower Extremity Assessment: Overall WFL for tasks assessed;Generalized weakness       Communication   Communication: Other (comment)(thick accent sometimes unable to understand)  Cognition Arousal/Alertness: Awake/alert Behavior During Therapy: WFL for tasks assessed/performed Overall Cognitive Status: No family/caregiver present to determine baseline cognitive functioning  General Comments General comments (skin integrity, edema, etc.): BP 116/55, HR max 94, SaO2 on 10 L O2 via [place] 100% at rest,  with ambulation poor pleth wave form but likely dropped to 85%O2 with cues for pursed lipped breathing rebounded to 97%O2        Assessment/Plan    PT Assessment Patient needs continued PT services  PT Problem List Decreased activity tolerance;Decreased mobility;Cardiopulmonary status limiting activity;Decreased cognition       PT Treatment Interventions DME instruction;Gait training;Stair training;Functional mobility training;Therapeutic activities;Therapeutic exercise;Balance training;Cognitive remediation;Patient/family education    PT Goals (Current goals can be found in the Care Plan section)  Acute Rehab PT Goals Patient Stated Goal: go home PT Goal Formulation: With patient Time For Goal Achievement: 10/02/19 Potential to Achieve Goals: Good    Frequency Min 3X/week    AM-PAC PT "6 Clicks" Mobility  Outcome Measure Help needed turning from your back to your side while in a flat bed without using bedrails?: None Help needed moving from lying on your back to sitting on the side of a flat bed without using bedrails?: A Little Help needed moving to and from a bed to a chair (including a wheelchair)?: A Little Help needed standing up from a chair using your arms (e.g., wheelchair or bedside chair)?: A Little Help needed to walk in hospital room?: A Little Help needed climbing 3-5 steps with a railing? : A Lot 6 Click Score: 18    End of Session Equipment Utilized During Treatment: Gait belt Activity Tolerance: Patient tolerated treatment well Patient left: in chair;with call bell/phone within reach;with chair alarm set;with nursing/sitter in room Nurse Communication: Mobility status;Other (comment)(possible O2 desaturation ) PT Visit Diagnosis: Difficulty in walking, not elsewhere classified (R26.2);Unsteadiness on feet (R26.81)    Time: 6812-7517 PT Time Calculation (min) (ACUTE ONLY): 31 min   Charges:   PT Evaluation $PT Eval Moderate Complexity: 1 Mod PT  Treatments $Gait Training: 8-22 mins        Kha Hari B. Migdalia Dk PT, DPT Acute Rehabilitation Services Pager [phone] Office [phone]   [place] 09/18/2019, 4:42 PM

## 2019-09-18 NOTE — Progress Notes (Signed)
PROGRESS NOTE                                                                                                                                                                                                             Patient Demographics:    Robert Little, is a 79 y.o. male, DOB - [birth year], KDT:[id number]  Admit date - 09/16/2019   Admitting Physician Thurnell Lose, MD  Outpatient Primary MD for the patient is Patient, No Pcp Per  LOS - 1  Chief Complaint  Patient presents with  . Shortness of Breath       Brief Narrative   Robert Little is a 79 y.o. male with medical history significant for chronic anemia, pulmonary fibrosis, hypertension, coronary artery disease, chronic diastolic CHF, and schizoaffective disorder, presenting to the emergency department with progressive shortness of breath long with severe orthopnea starting 1 to 2 days prior to hospital visit, in the hospital work-up suggestive of CHF and acute hypoxic respiratory failure and he was admitted to the hospital.   Subjective:   Patient in bed denies any headache chest or abdominal pain, shortness of breath is improved but complains that he coughs whenever he tries to eat his food.  This has been ongoing for several days.   Assessment  & Plan :     1.  Acute hypoxic respiratory failure due to acute on chronic diastolic CHF EF 99% on recent echocardiogram along with likely ongoing aspiration- started on high dose IV lasix, salt and fluid restriction, resume B Blocker at home dose, Tele monitor, advance activity and titrate o2 as tolerated.  Clinically there is high suspicion for ongoing aspiration as well, have changed his diet to soft with honey thick liquids, feeding assistance aspiration precautions, nursing staff informed, speech to evaluate.  For now empiric Unasyn.     2. AKI on CKD 4 - baseline creatinine of 2.4, will diurese and monitor.  3. BPH - on flomax.  4.  Anemia of chronic disease.   Stable, check anemia panel, continue oral PPI, add oral iron supplementation and monitor.  5.  CAD with elevated troponin and on ACS pattern.  Likely demand ischemia from CHF, home dose beta-blocker, aspirin and statin started.  Chest pain-free.  Recent echo reviewed.  6.  Schizoaffective disorder with bipolar..  Resume home medications.  7.  Elevated D-dimer.  Nonspecific, trend, leg ultrasound stable, low clinical suspicion for PE, switch to prophylactic heparin and monitor.  8.  History of pulmonary  fibrosis.  Not on home oxygen.  Supportive care.    Family Communication  : his caregiver Robert Little 09/17/19  Code Status :  Full  Disposition Plan  : Stay in the hospital for treatment of acute hypoxic respiratory failure because due to CHF requiring IV diuretics, thereafter to group home.  Consults  : None  Procedures  :    Lower extremity Leg ultrasound. No DVT.    Recent TTE -   1. Left ventricular ejection fraction, by estimation, is 60 to 65%. The left ventricle has normal function. The left ventricle has no regional  wall motion abnormalities. There is mild concentric left ventricular hypertrophy. Left ventricular diastolic parameters are consistent with Grade II diastolic dysfunction (pseudonormalization). Elevated left atrial pressure.  2. Right ventricular systolic function is normal. The right ventricular size is normal. Tricuspid regurgitation signal is inadequate for assessing PA pressure.  3. Left atrial size was moderately dilated.  4. The mitral valve is normal in structure and function. Trivial mitral valve regurgitation. No evidence of mitral stenosis.  5. The aortic valve is normal in structure and function. Aortic valve regurgitation is not visualized. Mild aortic valve sclerosis is present,  with no evidence of aortic valve stenosis.  6. The inferior vena cava is dilated in size with >50% respiratory variability, suggesting right atrial pressure of 8 mmHg.      DVT Prophylaxis  :  Heparin    Lab Results  Component Value Date   PLT 261 09/18/2019    Diet :  Diet Order            DIET SOFT Room service appropriate? Yes; Fluid consistency: Honey Thick  Diet effective now               Inpatient Medications Scheduled Meds: . aspirin  325 mg Oral Daily  . atenolol  25 mg Oral Daily  . atorvastatin  20 mg Oral q1800  . benztropine  0.5 mg Oral BID  . dexamethasone (DECADRON) injection  4 mg Intravenous Q24H  . heparin injection (subcutaneous)  5,000 Units Subcutaneous Q8H  . isosorbide mononitrate  30 mg Oral Daily  . pantoprazole  40 mg Oral Daily  . tamsulosin  0.4 mg Oral Daily   Continuous Infusions: . ampicillin-sulbactam (UNASYN) IV    . famotidine (PEPCID) IV 20 mg (09/18/19 0929)  . magnesium sulfate bolus IVPB     PRN Meds:.acetaminophen, albuterol, clonazePAM, guaiFENesin-dextromethorphan, ondansetron (ZOFRAN) IV  Antibiotics  :   Anti-infectives (From admission, onward)   Start     Dose/Rate Route Frequency Ordered Stop   09/18/19 1000  Ampicillin-Sulbactam (UNASYN) 3 g in sodium chloride 0.9 % 100 mL IVPB     3 g 200 mL/hr over 30 Minutes Intravenous Every 12 hours 09/18/19 0926            Objective:   Vitals:   09/18/19 0800 09/18/19 0809 09/18/19 0850 09/18/19 0932  BP:    (!) 126/59  Pulse: 65  61 61  Resp:  (!) 31 17   Temp:  98 F (36.7 C)    TempSrc:  Axillary    SpO2: 93%  94%   Weight:      Height:        SpO2: 94 % O2 Flow Rate (L/min): 15 L/min  Wt Readings from Last 3 Encounters:  09/18/19 71.5 kg  09/07/19 75.7 kg  05/22/18 71.8 kg     Intake/Output Summary (Last 24 hours) at 09/18/2019 1031 Last data  filed at 09/18/2019 0818 Gross per 24 hour  Intake 461.57 ml  Output 4900 ml  Net -4438.43 ml     Physical Exam  Awake Alert, No new F.N deficits, Normal affect [place].AT,PERRAL Supple Neck,No JVD, No cervical lymphadenopathy appriciated.  Symmetrical Chest wall movement,  Good air movement bilaterally, +ve rales RRR,No Gallops, Rubs or new Murmurs, No Parasternal Heave +ve B.Sounds, Abd Soft, No tenderness, No organomegaly appriciated, No rebound - guarding or rigidity. No Cyanosis, Clubbing, 1+ edema       Data Review:    CBC Recent Labs  Lab 09/16/19 2244 09/17/19 0827 09/18/19 0115  WBC 8.7 9.2 8.5  HGB 8.1* 7.7* 7.8*  HCT 26.3* 25.0* 24.2*  PLT 241 253 261  MCV 96.3 96.5 91.3  MCH 29.7 29.7 29.4  MCHC 30.8 30.8 32.2  RDW 14.2 14.2 14.1  LYMPHSABS 0.7  --   --   MONOABS 0.6  --   --   EOSABS 0.0  --   --   BASOSABS 0.0  --   --     Chemistries  Recent Labs  Lab 09/16/19 2244 09/17/19 0827 09/18/19 0115  NA 140 141 141  K 4.7 3.9 4.2  CL 107 104 103  CO2 16* 22 23  GLUCOSE 205* 160* 242*  BUN 43* 44* 52*  CREATININE 3.11* 3.05* 3.21*  CALCIUM 6.9* 6.7* 6.8*  AST 50*  --   --   ALT 48*  --   --   ALKPHOS 86  --   --   BILITOT 0.6  --   --   MG  --  1.1* 1.0*   Recent Labs  Lab 09/16/19 2245 09/17/19 0121 09/17/19 0224 09/17/19 0827 09/17/19 0828 09/18/19 0739  CRP  --   --   --  20.4*  --   --   DDIMER  --  5.68*  --  5.46*  --  4.72*  BNP 2,353.4*  --   --   --  2,182.6* 2,505.2*  PROCALCITON  --   --   --   --   --  0.40  SARSCOV2NAA  --   --  NEGATIVE  --   --   --     ------------------------------------------------------------------------------------------------------------------ No results for input(s): CHOL, HDL, LDLCALC, TRIG, CHOLHDL, LDLDIRECT in the last 72 hours.  No results found for: HGBA1C ------------------------------------------------------------------------------------------------------------------ No results for input(s): TSH, T4TOTAL, T3FREE, THYROIDAB in the last 72 hours.  Invalid input(s): FREET3 ------------------------------------------------------------------------------------------------------------------ No results for input(s): VITAMINB12, FOLATE, FERRITIN, TIBC, IRON, RETICCTPCT  in the last 72 hours.  Coagulation profile No results for input(s): INR, PROTIME in the last 168 hours.  Recent Labs    09/17/19 0827 09/18/19 0739  DDIMER 5.46* 4.72*    Cardiac Enzymes No results for input(s): CKMB, TROPONINI, MYOGLOBIN in the last 168 hours.  Invalid input(s): CK ------------------------------------------------------------------------------------------------------------------    Component Value Date/Time   BNP 2,505.2 (H) 09/18/2019 7902    Micro Results Recent Results (from the past 240 hour(s))  SARS CORONAVIRUS 2 (TAT 6-24 HRS) Nasopharyngeal Nasopharyngeal Swab     Status: None   Collection Time: 09/17/19  2:24 AM   Specimen: Nasopharyngeal Swab  Result Value Ref Range Status   SARS Coronavirus 2 NEGATIVE NEGATIVE Final    Comment: (NOTE) SARS-CoV-2 target nucleic acids are NOT DETECTED. The SARS-CoV-2 RNA is generally detectable in upper and lower respiratory specimens during the acute phase of infection. Negative results do not preclude SARS-CoV-2 infection, do not rule out co-infections  with other pathogens, and should not be used as the sole basis for treatment or other patient management decisions. Negative results must be combined with clinical observations, patient history, and epidemiological information. The expected result is Negative. Fact Sheet for Patients: SugarRoll.be Fact Sheet for Healthcare Providers: https://www.woods-mathews.com/ This test is not yet approved or cleared by the [place] FDA and  has been authorized for detection and/or diagnosis of SARS-CoV-2 by FDA under an Emergency Use Authorization (EUA). This EUA will remain  in effect (meaning this test can be used) for the duration of the COVID-19 declaration under Section 56 4(b)(1) of the Act, 21 U.S.C. section 360bbb-3(b)(1), unless the authorization is terminated or revoked sooner. Performed at [facility],  [place] [address] [place], [place] [address]   MRSA PCR Screening     Status: None   Collection Time: 09/17/19 11:12 PM   Specimen: Nasopharyngeal  Result Value Ref Range Status   MRSA by PCR NEGATIVE NEGATIVE Final    Comment:        The GeneXpert MRSA Assay (FDA approved for NASAL specimens only), is one component of a comprehensive MRSA colonization surveillance program. It is not intended to diagnose MRSA infection nor to guide or monitor treatment for MRSA infections. Performed at [facility], [place] [address], [place], [place] [address]     Radiology Reports X-ray chest PA and lateral  Result Date: 09/05/2019 CLINICAL DATA:  Syncope EXAM: CHEST - 2 VIEW COMPARISON:  05/15/2018 FINDINGS: Increased density at the left lung base superimposed on chronic opacity. Chronic mild interstitial prominence. No significant pleural effusion. No pneumothorax. Stable cardiomediastinal contours. No acute osseous abnormality. IMPRESSION: Left basilar atelectasis/consolidation superimposed on bibasilar fibrotic changes. Electronically Signed   By: Macy Mis M.D.   On: 09/05/2019 21:04   DG Chest Port 1 View  Result Date: 09/17/2019 CLINICAL DATA:  Chest pain and shortness of breath. EXAM: PORTABLE CHEST 1 VIEW COMPARISON:  Radiograph earlier this day. Radiograph yesterday and earlier this month also reviewed. FINDINGS: Post cardiomegaly and CABG. Multiple broken sternal wires again seen. Mild cardiomegaly is stable. Aortic atherosclerosis. Reticular opacities likely represent underlying pulmonary fibrosis, however the degree of interstitial thickening is increased since exam earlier this month. Blunting of the right costophrenic angle may represent developing effusion. No pneumothorax. IMPRESSION: 1. Findings suspicious for pulmonary edema superimposed on pulmonary fibrosis/interstitial lung disease. 2. Blunting of the right costophrenic angle may represent developing effusion. 3. Stable  cardiomegaly post CABG. Electronically Signed   By: Keith Rake M.D.   On: 09/17/2019 13:50   DG Chest Port 1 View  Result Date: 09/17/2019 CLINICAL DATA:  Shortness of breath. EXAM: PORTABLE CHEST 1 VIEW COMPARISON:  September 16, 2019. FINDINGS: Stable cardiomediastinal silhouette. Atherosclerosis of thoracic aorta is noted. Sternotomy wires are noted. No pneumothorax is noted. No significant pleural effusion is noted. Bilateral lung opacities are noted which may represent bilateral pulmonary fibrosis or chronic interstitial lung disease, although acute superimposed edema or inflammation cannot be excluded. Bony thorax is unremarkable. IMPRESSION: Aortic atherosclerosis. Bilateral lung opacities are noted which may represent pulmonary fibrosis or chronic interstitial lung disease, although acute superimposed edema or inflammation cannot be excluded. Aortic Atherosclerosis (ICD10-I70.0). Electronically Signed   By: Marijo Conception M.D.   On: 09/17/2019 08:23   DG Chest Port 1 View  Result Date: 09/16/2019 CLINICAL DATA:  79 year old male with shortness of breath. EXAM: PORTABLE CHEST 1 VIEW COMPARISON:  Chest radiograph dated 09/05/2019. FINDINGS: There is  diffuse interstitial coarsening and chronic bronchitic changes. There is however overall interval increase in the interstitial densities compared to the prior radiograph which may represent progression of underlying fibrosis or superimposition of pneumonia, possibly viral or atypical. Clinical correlation is recommended. No focal consolidation, pleural effusion, or pneumothorax. Stable cardiomediastinal silhouette. Median sternotomy wires and CABG vascular clips. No acute osseous pathology. IMPRESSION: Interval increase in the interstitial densities compared to the prior radiograph may represent progression of underlying fibrosis or superimposition of pneumonia. Clinical correlation is recommended. Electronically Signed   By: Anner Crete M.D.   On:  09/16/2019 23:24   ECHOCARDIOGRAM COMPLETE  Result Date: 09/06/2019    ECHOCARDIOGRAM REPORT   Patient Name:   KODEN HUNZEKER Date of Exam: 09/06/2019 Medical Rec #:  [id number]    Height:       68.0 in Accession #:    [id number]   Weight:       158.5 lb Date of Birth:  Jan 16, 1941    BSA:          1.851 m Patient Age:    32 years     BP:           132/65 mmHg Patient Gender: M            HR:           67 bpm. Exam Location:  Inpatient Procedure: 2D Echo Indications:    Syncope R55  History:        Patient has prior history of Echocardiogram examinations and                 Patient has no prior history of Echocardiogram examinations.                 CAD, Prior CABG; Risk Factors:Hypertension.  Sonographer:    Mikki Santee RDCS (AE) Referring Phys: [place]  1. Left ventricular ejection fraction, by estimation, is 60 to 65%. The left ventricle has normal function. The left ventricle has no regional wall motion abnormalities. There is mild concentric left ventricular hypertrophy. Left ventricular diastolic parameters are consistent with Grade II diastolic dysfunction (pseudonormalization). Elevated left atrial pressure.  2. Right ventricular systolic function is normal. The right ventricular size is normal. Tricuspid regurgitation signal is inadequate for assessing PA pressure.  3. Left atrial size was moderately dilated.  4. The mitral valve is normal in structure and function. Trivial mitral valve regurgitation. No evidence of mitral stenosis.  5. The aortic valve is normal in structure and function. Aortic valve regurgitation is not visualized. Mild aortic valve sclerosis is present, with no evidence of aortic valve stenosis.  6. The inferior vena cava is dilated in size with >50% respiratory variability, suggesting right atrial pressure of 8 mmHg. FINDINGS  Left Ventricle: Left ventricular ejection fraction, by estimation, is 60 to 65%. The left ventricle has normal function. The left  ventricle has no regional wall motion abnormalities. The left ventricular internal cavity size was normal in size. There is  mild concentric left ventricular hypertrophy. Left ventricular diastolic parameters are consistent with Grade II diastolic dysfunction (pseudonormalization). Elevated left atrial pressure. Right Ventricle: The right ventricular size is normal. No increase in right ventricular wall thickness. Right ventricular systolic function is normal. Tricuspid regurgitation signal is inadequate for assessing PA pressure. Left Atrium: Left atrial size was moderately dilated. Right Atrium: Right atrial size was normal in size. Pericardium: There is no evidence of pericardial effusion. Mitral Valve: The mitral valve  is normal in structure and function. Normal mobility of the mitral valve leaflets. Mild mitral annular calcification. Trivial mitral valve regurgitation. No evidence of mitral valve stenosis. Tricuspid Valve: The tricuspid valve is normal in structure. Tricuspid valve regurgitation is not demonstrated. No evidence of tricuspid stenosis. Aortic Valve: The aortic valve is normal in structure and function. Aortic valve regurgitation is not visualized. Mild aortic valve sclerosis is present, with no evidence of aortic valve stenosis. Pulmonic Valve: The pulmonic valve was normal in structure. Pulmonic valve regurgitation is trivial. No evidence of pulmonic stenosis. Aorta: The aortic root is normal in size and structure. Venous: The inferior vena cava is dilated in size with greater than 50% respiratory variability, suggesting right atrial pressure of 8 mmHg. IAS/Shunts: No atrial level shunt detected by color flow Doppler.  LEFT VENTRICLE PLAX 2D LVIDd:         4.50 cm  Diastology LVIDs:         3.10 cm  LV e' lateral:   8.70 cm/s LV PW:         1.20 cm  LV E/e' lateral: 11.5 LV IVS:        1.30 cm  LV e' medial:    5.87 cm/s LVOT diam:     1.95 cm  LV E/e' medial:  17.0 LV SV:         51 LV SV  Index:   28 LVOT Area:     2.99 cm  RIGHT VENTRICLE RV S prime:     10.30 cm/s TAPSE (M-mode): 1.7 cm LEFT ATRIUM             Index       RIGHT ATRIUM           Index LA diam:        4.90 cm 2.65 cm/m  RA Area:     15.40 cm LA Vol (A2C):   55.7 ml 30.08 ml/m RA Volume:   30.30 ml  16.37 ml/m LA Vol (A4C):   68.2 ml 36.84 ml/m LA Biplane Vol: 65.2 ml 35.22 ml/m  AORTIC VALVE LVOT Vmax:   76.30 cm/s LVOT Vmean:  53.700 cm/s LVOT VTI:    0.172 m  AORTA Ao Root diam: 3.00 cm MITRAL VALVE MV Area (PHT): 3.89 cm    SHUNTS MV Decel Time: 195 msec    Systemic VTI:  0.17 m MV E velocity: 99.90 cm/s  Systemic Diam: 1.95 cm MV A velocity: 73.50 cm/s MV E/A ratio:  1.36 Mihai Croitoru MD Electronically signed by Sanda Klein MD Signature Date/Time: 09/06/2019/2:51:24 PM    Final    VAS [place] LOWER EXTREMITY VENOUS (DVT) (ONLY [facility] & [facility])  Result Date: 09/17/2019  Lower Venous DVTStudy Indications: Edema.  Limitations: Poor ultrasound/tissue interface and body habitus. Comparison Study: No prior study Performing Technologist: Maudry Mayhew MHA, RDMS, RVT, RDCS  Examination Guidelines: A complete evaluation includes B-mode imaging, spectral Doppler, color Doppler, and power Doppler as needed of all accessible portions of each vessel. Bilateral testing is considered an integral part of a complete examination. Limited examinations for reoccurring indications may be performed as noted. The reflux portion of the exam is performed with the patient in reverse Trendelenburg.  +---------+---------------+---------+-----------+----------+--------------+ RIGHT    CompressibilityPhasicitySpontaneityPropertiesThrombus Aging +---------+---------------+---------+-----------+----------+--------------+ CFV      Full           No       Yes                                 +---------+---------------+---------+-----------+----------+--------------+  SFJ      Full                                                         +---------+---------------+---------+-----------+----------+--------------+ FV Prox  Full                                                        +---------+---------------+---------+-----------+----------+--------------+ FV Mid   Full                                                        +---------+---------------+---------+-----------+----------+--------------+ FV DistalFull                                                        +---------+---------------+---------+-----------+----------+--------------+ PFV      Full                                                        +---------+---------------+---------+-----------+----------+--------------+ POP      Full           No       Yes                                 +---------+---------------+---------+-----------+----------+--------------+ PTV      Full                                                        +---------+---------------+---------+-----------+----------+--------------+ PERO     Full                                                        +---------+---------------+---------+-----------+----------+--------------+   +---------+---------------+---------+-----------+----------+--------------+ LEFT     CompressibilityPhasicitySpontaneityPropertiesThrombus Aging +---------+---------------+---------+-----------+----------+--------------+ CFV      Full           No       Yes                                 +---------+---------------+---------+-----------+----------+--------------+ SFJ      Full                                                        +---------+---------------+---------+-----------+----------+--------------+  FV Prox  Full                                                        +---------+---------------+---------+-----------+----------+--------------+ FV Mid   Full                                                         +---------+---------------+---------+-----------+----------+--------------+ FV DistalFull                                                        +---------+---------------+---------+-----------+----------+--------------+ PFV      Full                                                        +---------+---------------+---------+-----------+----------+--------------+ POP      Full           No       Yes                                 +---------+---------------+---------+-----------+----------+--------------+ PTV      Full                                                        +---------+---------------+---------+-----------+----------+--------------+ PERO     Full                                                        +---------+---------------+---------+-----------+----------+--------------+     Summary: RIGHT: - There is no evidence of deep vein thrombosis in the lower extremity.  - No cystic structure found in the popliteal fossa.  LEFT: - There is no evidence of deep vein thrombosis in the lower extremity.  - No cystic structure found in the popliteal fossa.  Bilateral lower extremity venous flow is pulsatile, suggestive of possible elevated right heart pressure. *See table(s) above for measurements and observations. Electronically signed by Ruta Hinds MD on 09/17/2019 at 7:07:29 PM.    Final     Time Spent in minutes  30   Lala Lund M.D on 09/18/2019 at 10:31 AM  To page go to [email or web address] - password [facility]

## 2019-09-18 NOTE — Evaluation (Signed)
Clinical/Bedside Swallow Evaluation Patient Details  Name: Robert Little MRN: [id number] Date of Birth: 1941/05/25  Today's Date: 09/18/2019 Time: SLP Start Time (ACUTE ONLY): 1012 SLP Stop Time (ACUTE ONLY): 1036 SLP Time Calculation (min) (ACUTE ONLY): 24 min  Past Medical History:  Past Medical History:  Diagnosis Date  . Anemia   . Coronary artery disease   . GERD (gastroesophageal reflux disease)   . Hypertension   . Pulmonary fibrosis ([place])   . Schizo affective schizophrenia [facility])    Past Surgical History:  Past Surgical History:  Procedure Laterality Date  . CORONARY ARTERY BYPASS GRAFT     HPI:  79 yo male adm to [facility] with respiratory difficulties. Pt PMH + for GERD, schizoaffective d/o, pulmonary fibrosis, HLD, anemia, AKI, sepsis.  Pt desated to 75% during his lunch meal yesterday requiring increase in oxygen to 15 Liters on NRB.  Today he is on 8 Liters.  He is noted to have broken sternal wires per imaging.  Pt with possible developing effusion, pulmonary edema on fibrosis/ILD.  Pt reports dysphagia to liquids more than foods  causing him to cough. He also states pills will lodge in his throat - pointing to distal region/ ? pyriform sinus.  He denies expectorating food or liquid and states he can "get it down".   Per recent admit note, group home staff reports pt has periods of time when his intake is poor but pt denies specific reasoning for this issue.   Assessment / Plan / Recommendation Clinical Impression  Pt presents with clinical indications concerning for pharyngo-cervical esophageal dysphagia evidenced by multiple swallows with solids and post-swallow of thin with immediate cough.  CN exam unremarkable except pt noted to tilt his head to the right - stating this is due to discomfort.  He reports undergoing a posterior cervical spine surgery - but denies chronic dysphagia after procedure. Pt complains of coughing with thinner liquids and sensing food lodging in throat  intermittently.  Pt with tolerance of nectar, puree, cracker without indication of aspiration.  He is noted to wheeze with exertion , reports this to be normal recently.  Advised him to importance of rest breaks if dyspneic.   Pt states he has reflux but sometimes the facility does not have his medications which causes his reflux to be severe.  Will modify diet to dys3/nectar and proceed with MBS next date to allow instrumental testing of pharyngo-cervical esophgeal region.  Pt may have issues with reflux - and advise esophageal precautions. SLP Visit Diagnosis: Dysphagia, pharyngoesophageal phase (R13.14)    Aspiration Risk  Mild aspiration risk    Diet Recommendation Dysphagia 3 (Mech soft);Nectar-thick liquid   Liquid Administration via: Cup;No straw Medication Administration: Whole meds with puree Supervision: Patient able to self feed Compensations: Slow rate;Small sips/bites;Follow solids with liquid Postural Changes: Seated upright at 90 degrees;Remain upright for at least 30 minutes after po intake    Other  Recommendations Oral Care Recommendations: Oral care BID Other Recommendations: Order thickener from pharmacy   Follow up Recommendations (tbd)      Frequency and Duration min 1 x/week  1 week       Prognosis Prognosis for Safe Diet Advancement: Good Barriers to Reach Goals: Time post onset      Swallow Study   General Date of Onset: 09/18/19 HPI: 79 yo male adm to [facility] with respiratory difficulties. Pt PMH + for GERD, schizoaffective d/o, pulmonary fibrosis, HLD, anemia, AKI, sepsis.  Pt desated to 75% during his lunch  meal yesterday requiring increase in oxygen to 15 Liters on NRB.  Today he is on 8 Liters.  He is noted to have broken sternal wires per imaging.  Pt with possible developing effusion, pulmonary edema on fibrosis/ILD.  Pt reports dysphagia to liquids more than foods  causing him to cough. He also states pills will lodge in his throat - pointing to distal  region/ ? pyriform sinus.  He denies expectorating food or liquid and states he can "get it down".   Per recent admit note, group home staff reports pt has periods of time when his intake is poor but pt denies specific reasoning for this issue. Type of Study: Bedside Swallow Evaluation Diet Prior to this Study: Dysphagia 3 (soft);Honey-thick liquids Respiratory Status: Nasal cannula History of Recent Intubation: No Behavior/Cognition: Alert;Cooperative;Pleasant mood Oral Cavity Assessment: Within Functional Limits Oral Care Completed by SLP: No Oral Cavity - Dentition: Other (Comment);Missing dentition(3 lower dentition only) Vision: Functional for self-feeding Self-Feeding Abilities: Able to feed self Patient Positioning: Upright in bed Baseline Vocal Quality: Normal Volitional Cough: Strong Volitional Swallow: Able to elicit    Oral/Motor/Sensory Function Overall Oral Motor/Sensory Function: Within functional limits   Ice Chips Ice chips: Not tested Other Comments: pt states ice hurts his teeth   Thin Liquid Thin Liquid: Impaired Presentation: Spoon Pharyngeal  Phase Impairments: Cough - Immediate    Nectar Thick Nectar Thick Liquid: Within functional limits Presentation: Cup;Spoon;Straw;Self Fed   Honey Thick Honey Thick Liquid: Not tested   Puree Puree: Within functional limits Presentation: Self Fed;Spoon   Solid     Solid: Impaired Presentation: Self Fed Pharyngeal Phase Impairments: Multiple swallows      [name] 09/18/2019,10:59 AM   Kathleen Lime, MS [place] Office [phone]

## 2019-09-18 NOTE — Progress Notes (Addendum)
ANTICOAGULATION CONSULT NOTE Pharmacy Consult for Heparin Indication: VTE  No Known Allergies  Patient Measurements: Height: 5\' 8"  (172.7 cm) Weight: 157 lb 10.1 oz (71.5 kg) IBW/kg (Calculated) : 68.4  Vital Signs: Temp: 98 F (36.7 C) (03/16 0809) Temp Source: Axillary (03/16 0809) BP: 126/59 (03/16 0932) Pulse Rate: 61 (03/16 0932)  Labs: Recent Labs    09/16/19 2244 09/16/19 2244 09/17/19 0121 09/17/19 0449 09/17/19 0827 09/17/19 1209 09/17/19 1351 09/17/19 2250 09/18/19 0115  HGB 8.1*   < >  --   --  7.7*  --   --   --  7.8*  HCT 26.3*  --   --   --  25.0*  --   --   --  24.2*  PLT 241  --   --   --  253  --   --   --  261  HEPARINUNFRC  --   --   --   --   --  0.72*  --  0.55 0.63  CREATININE 3.11*  --   --   --  3.05*  --   --   --  3.21*  TROPONINIHS 80*   < > 102* 133*  --   --  123*  --   --    < > = values in this interval not displayed.    Estimated Creatinine Clearance: 18.6 mL/min (A) (by C-G formula based on SCr of 3.21 mg/dL (H)).   Assessment: 79 y.o. male with with elevated d-dimer for heparin.  Heparin at goal, no bleeding noted. No changes currently. Hgb stable 7.8, plt wnl.   Orders to start unasyn for possibly aspiration pneumonia. Will dose adjust based on renal function. Afeb, wbc wnl.   Goal of Therapy:  Heparin level 0.3-0.7 units/ml Monitor platelets by anticoagulation protocol: Yes   Plan:  Continue Heparin at current rate  Follow up length of therapy Unasyn 3g q12 hours  Erin Hearing PharmD., BCPS Clinical Pharmacist 09/18/2019 9:34 AM

## 2019-09-18 NOTE — Plan of Care (Signed)
  Problem: Education: Goal: Knowledge of General Education information will improve Description: Including pain rating scale, medication(s)/side effects and non-pharmacologic comfort measures Outcome: Progressing   Problem: Clinical Measurements: Goal: Ability to maintain clinical measurements within normal limits will improve Outcome: Progressing Goal: Will remain free from infection Outcome: Progressing Goal: Diagnostic test results will improve Outcome: Progressing   Problem: Nutrition: Goal: Adequate nutrition will be maintained Outcome: Progressing   Problem: Elimination: Goal: Will not experience complications related to urinary retention Outcome: Progressing   Problem: Pain Managment: Goal: General experience of comfort will improve Outcome: Progressing   Problem: Safety: Goal: Ability to remain free from injury will improve Outcome: Progressing

## 2019-09-19 ENCOUNTER — Inpatient Hospital Stay (HOSPITAL_COMMUNITY): Payer: Medicare Other

## 2019-09-19 DIAGNOSIS — R778 Other specified abnormalities of plasma proteins: Secondary | ICD-10-CM

## 2019-09-19 DIAGNOSIS — J9601 Acute respiratory failure with hypoxia: Secondary | ICD-10-CM

## 2019-09-19 DIAGNOSIS — I959 Hypotension, unspecified: Secondary | ICD-10-CM

## 2019-09-19 DIAGNOSIS — I25118 Atherosclerotic heart disease of native coronary artery with other forms of angina pectoris: Secondary | ICD-10-CM

## 2019-09-19 DIAGNOSIS — R7989 Other specified abnormal findings of blood chemistry: Secondary | ICD-10-CM

## 2019-09-19 LAB — CBC
HCT: 23.9 % — ABNORMAL LOW (ref 39.0–52.0)
Hemoglobin: 7.7 g/dL — ABNORMAL LOW (ref 13.0–17.0)
MCH: 29.4 pg (ref 26.0–34.0)
MCHC: 32.2 g/dL (ref 30.0–36.0)
MCV: 91.2 fL (ref 80.0–100.0)
Platelets: 293 10*3/uL (ref 150–400)
RBC: 2.62 MIL/uL — ABNORMAL LOW (ref 4.22–5.81)
RDW: 14.1 % (ref 11.5–15.5)
WBC: 12.8 10*3/uL — ABNORMAL HIGH (ref 4.0–10.5)
nRBC: 0 % (ref 0.0–0.2)

## 2019-09-19 LAB — PROCALCITONIN: Procalcitonin: 0.35 ng/mL

## 2019-09-19 LAB — BASIC METABOLIC PANEL
Anion gap: 16 — ABNORMAL HIGH (ref 5–15)
BUN: 68 mg/dL — ABNORMAL HIGH (ref 8–23)
CO2: 23 mmol/L (ref 22–32)
Calcium: 7 mg/dL — ABNORMAL LOW (ref 8.9–10.3)
Chloride: 98 mmol/L (ref 98–111)
Creatinine, Ser: 3.46 mg/dL — ABNORMAL HIGH (ref 0.61–1.24)
GFR calc Af Amer: 19 mL/min — ABNORMAL LOW (ref >60)
GFR calc non Af Amer: 16 mL/min — ABNORMAL LOW (ref >60)
Glucose, Bld: 287 mg/dL — ABNORMAL HIGH (ref 70–99)
Potassium: 4 mmol/L (ref 3.5–5.1)
Sodium: 137 mmol/L (ref 135–145)

## 2019-09-19 LAB — BRAIN NATRIURETIC PEPTIDE: B Natriuretic Peptide: 1039.5 pg/mL — ABNORMAL HIGH (ref 0.0–100.0)

## 2019-09-19 LAB — D-DIMER, QUANTITATIVE: D-Dimer, Quant: 2.97 ug/mL-FEU — ABNORMAL HIGH (ref 0.00–0.50)

## 2019-09-19 LAB — MAGNESIUM: Magnesium: 2.3 mg/dL (ref 1.7–2.4)

## 2019-09-19 LAB — C-REACTIVE PROTEIN: CRP: 19.6 mg/dL — ABNORMAL HIGH (ref <1.0)

## 2019-09-19 MED ORDER — ARFORMOTEROL TARTRATE 15 MCG/2ML IN NEBU: 15.0000 ug | INHALATION_SOLUTION | Freq: Two times a day (BID) | RESPIRATORY_TRACT | Status: DC

## 2019-09-19 MED ORDER — BUDESONIDE 0.5 MG/2ML IN SUSP: 0.5000 mg | Freq: Two times a day (BID) | RESPIRATORY_TRACT | Status: DC

## 2019-09-19 NOTE — Progress Notes (Signed)
PROGRESS NOTE  Robert Little YIR:[id number] DOB: 03/28/1941   PCP: Patient, No Pcp Per  Patient is from: Group home  DOA: 09/16/2019 LOS: 2  Brief Narrative / Interim history: 79 year old male with history of CAD, diastolic CHF, pulmonary fibrosis, HTN, chronic anemia, schizoaffective disorder presenting with progressive shortness of breath and orthopnea for 2 days, and admitted for acute hypoxic respiratory failure due to diastolic CHF exacerbation, and possible aspiration pneumonia.  Started on IV Lasix and IV Unasyn.   Subjective: No major events overnight or this morning.  Continues to require 6 L by HFNC.  Reports cough with occasional phlegm.  He denies hemoptysis.  Denies chest pain or shortness of breath.  Admits to weakness.  Denies GI or UTI symptoms.  Objective: Vitals:   09/19/19 0650 09/19/19 0800 09/19/19 0818 09/19/19 0819  BP:  (!) 105/40    Pulse:  61  62  Resp:  20  20  Temp:  (!) 95.9 F (35.5 C) (!) 96.9 F (36.1 C)   TempSrc:  Axillary Axillary   SpO2:  95%    Weight: 68.3 kg     Height:        Intake/Output Summary (Last 24 hours) at 09/19/2019 1136 Last data filed at 09/19/2019 0700 Gross per 24 hour  Intake 1070 ml  Output 1400 ml  Net -330 ml   Filed Weights   09/17/19 2242 09/18/19 0300 09/19/19 0650  Weight: 71.5 kg 71.5 kg 68.3 kg    Examination:  GENERAL: No apparent distress.  Nontoxic. HEENT: MMM.  Vision and hearing grossly intact.  NECK: Supple.  No apparent JVD.  RESP: 93% on 6 L by HFNC.  No IWOB.  Fair aeration.  Bibasilar crackles. CVS:  RRR. Heart sounds normal.  ABD/GI/GU: Bowel sounds present. Soft. Non tender.  MSK/EXT:  Moves extremities.  Trace edema bilaterally.  Significant muscle mass wasting. SKIN: no apparent skin lesion or wound NEURO: Awake, alert and oriented fairly.  No apparent focal neuro deficit. PSYCH: Calm. Normal affect.  Procedures:  None  Assessment & Plan: Acute respiratory failure with hypoxia due to  diastolic CHF exacerbation,  aspiration pneumonia, pulmonary fibrosis and anemia.  Currently requiring 6 L by HFNC to maintain saturation in low 90s. -Wean oxygen as able -Encourage incentive spirometry -Continue dexamethasone-for pulmonary fibrosis? -Treat CHF and aspiration pneumonia as below. -VQ scan to exclude PE given significant oxygen requirement. -Consult PCCM and cardiology  Acute on chronic diastolic CHF: Echo on 5/0/0938 with EF of 60 to 65%, G2-DD and moderate LAE.  Initial CXR with interval increase in interstitial densities.  Repeat CXR concerning for pulmonary edema.  BNP 2300 (no prior to compare to)> 1000.  Continues to require 6 L by HFNC.  Had about 2.5 L UOP/24 hours.  Renal function slightly worse.  Soft blood pressures this morning.  Overall, looks euvolemic except for mild bibasilar crackles and trace edema. -Hold Lasix in the setting of worsening renal function and soft blood pressures -Monitor fluid status, renal function and electrolytes -Sodium and fluid restrictions -Cardiology consulted  Aspiration pneumonia in the setting of dysphagia-patient with increased inflammatory markers and procalcitonin COVID-19 negative.  Concern about aspiration. He has low temp and soft blood pressures -SLP recommended dysphagia 3 diet.  MBS pending. -Aspiration precautions -Continue IV Unasyn  Hypotension: Soft blood pressures this morning. -Hold Imdur, diuretic and metoprolol today  AKI on CKD-4/azotemia: baseline Cr 2.5-2.6> 3.11 (admit)> 3.46.  Could be due to hypotension, diuretics and/or high-dose aspirin -Hold Imdur,  metoprolol and Lasix in the setting of hypotension -Check renal ultrasound -Continue monitoring -Doubt candidacy for dialysis if renal function continues to get worse   Anemia of renal disease: Baseline Hgb 8-9> 8.1 (admit)>> 7.7.  Low iron sat with high ferritin suggestive for some acute inflammatory process. -Continue iron supplementation. -Continue  monitoring  Elevated troponin in patient with history of CAD: mildly elevated  HS trop 102>>>123 likely demand ischemia from CHF vs ACS.  No chest pain.  EKG with diffuse T wave changes.  Echo as above. -Hold home metoprolol in the setting of soft blood pressures -Continue statin and aspirin  Schizoaffective disorder with bipolar: Stable -Continue home medications  Elevated D-dimer: nonspecific especially in the setting of renal failure.  Lower extremity Doppler negative for DVT. Transitioned from IV heparin to subcu heparin by previous attending as suspicion for PE was low.  However, patient continues to have significant O2 requirement. -Will obtain VQ scan to exclude PE  History of pulmonary fibrosis -Consult PCCM  History of BPH: No LUTS -Renal ultrasound -Monitor urine output  Debility/physical deconditioning -PT/OT eval   Nutrition: Significant muscle mass wasting -Consult dietitian               DVT prophylaxis: Subcu heparin Code Status: DNR/DNI Family Communication: Patient and/or RN. Updated Mr. Robert Little (care giver)  Discharge barrier: Respiratory failure with high oxygen requirement and AKI Patient is from: Group home Final disposition: To be determined based on clinical course  Consultants: Cardiology, PCCM   Microbiology summarized: COVID-19 screen negative. MRSA PCR negative.  Sch Meds:  Scheduled Meds: . aspirin  325 mg Oral Daily  . atorvastatin  20 mg Oral q1800  . benztropine  0.5 mg Oral BID  . dexamethasone (DECADRON) injection  4 mg Intravenous Q24H  . ferrous sulfate  325 mg Oral TID WC  . heparin injection (subcutaneous)  5,000 Units Subcutaneous Q8H  . isosorbide mononitrate  30 mg Oral Daily  . pantoprazole  40 mg Oral Daily  . tamsulosin  0.4 mg Oral Daily   Continuous Infusions: . ampicillin-sulbactam (UNASYN) IV 3 g (09/19/19 1021)  . famotidine (PEPCID) IV     PRN Meds:.acetaminophen, albuterol, clonazePAM,  guaiFENesin-dextromethorphan, ondansetron (ZOFRAN) IV, Resource ThickenUp Clear  Antimicrobials: Anti-infectives (From admission, onward)   Start     Dose/Rate Route Frequency Ordered Stop   09/18/19 1000  Ampicillin-Sulbactam (UNASYN) 3 g in sodium chloride 0.9 % 100 mL IVPB     3 g 200 mL/hr over 30 Minutes Intravenous Every 12 hours 09/18/19 0926         I have personally reviewed the following labs and images: CBC: Recent Labs  Lab 09/16/19 2244 09/17/19 0827 09/18/19 0115 09/19/19 0240  WBC 8.7 9.2 8.5 12.8*  NEUTROABS 7.2  --   --   --   HGB 8.1* 7.7* 7.8* 7.7*  HCT 26.3* 25.0* 24.2* 23.9*  MCV 96.3 96.5 91.3 91.2  PLT 241 253 261 293   BMP &GFR Recent Labs  Lab 09/16/19 2244 09/17/19 0827 09/18/19 0115 09/19/19 0240  NA 140 141 141 137  K 4.7 3.9 4.2 4.0  CL 107 104 103 98  CO2 16* 22 23 23   GLUCOSE 205* 160* 242* 287*  BUN 43* 44* 52* 68*  CREATININE 3.11* 3.05* 3.21* 3.46*  CALCIUM 6.9* 6.7* 6.8* 7.0*  MG  --  1.1* 1.0* 2.3   Estimated Creatinine Clearance: 17.3 mL/min (A) (by C-G formula based on SCr of 3.46 mg/dL (H)). Liver &  Pancreas: Recent Labs  Lab 09/16/19 2244  AST 50*  ALT 48*  ALKPHOS 86  BILITOT 0.6  PROT 6.4*  ALBUMIN 2.9*   No results for input(s): LIPASE, AMYLASE in the last 168 hours. No results for input(s): AMMONIA in the last 168 hours. Diabetic: No results for input(s): HGBA1C in the last 72 hours. Recent Labs  Lab 09/16/19 2241  GLUCAP 221*   Cardiac Enzymes: No results for input(s): CKTOTAL, CKMB, CKMBINDEX, TROPONINI in the last 168 hours. No results for input(s): PROBNP in the last 8760 hours. Coagulation Profile: No results for input(s): INR, PROTIME in the last 168 hours. Thyroid Function Tests: No results for input(s): TSH, T4TOTAL, FREET4, T3FREE, THYROIDAB in the last 72 hours. Lipid Profile: No results for input(s): CHOL, HDL, LDLCALC, TRIG, CHOLHDL, LDLDIRECT in the last 72 hours. Anemia Panel: Recent  Labs    09/18/19 1132  VITAMINB12 468  FOLATE 10.7  FERRITIN 1,124*  TIBC 216*  IRON 25*  RETICCTPCT 3.4*   Urine analysis:    Component Value Date/Time   COLORURINE STRAW (A) 09/17/2019 0415   APPEARANCEUR CLEAR 09/17/2019 0415   LABSPEC 1.009 09/17/2019 0415   PHURINE 5.0 09/17/2019 [place] 09/17/2019 0415   HGBUR NEGATIVE 09/17/2019 0415   BILIRUBINUR NEGATIVE 09/17/2019 0415   KETONESUR NEGATIVE 09/17/2019 0415   PROTEINUR NEGATIVE 09/17/2019 0415   NITRITE NEGATIVE 09/17/2019 0415   LEUKOCYTESUR NEGATIVE 09/17/2019 0415   Sepsis Labs: Invalid input(s): PROCALCITONIN, [place]  Microbiology: Recent Results (from the past 240 hour(s))  SARS CORONAVIRUS 2 (TAT 6-24 HRS) Nasopharyngeal Nasopharyngeal Swab     Status: None   Collection Time: 09/17/19  2:24 AM   Specimen: Nasopharyngeal Swab  Result Value Ref Range Status   SARS Coronavirus 2 NEGATIVE NEGATIVE Final    Comment: (NOTE) SARS-CoV-2 target nucleic acids are NOT DETECTED. The SARS-CoV-2 RNA is generally detectable in upper and lower respiratory specimens during the acute phase of infection. Negative results do not preclude SARS-CoV-2 infection, do not rule out co-infections with other pathogens, and should not be used as the sole basis for treatment or other patient management decisions. Negative results must be combined with clinical observations, patient history, and epidemiological information. The expected result is Negative. Fact Sheet for Patients: SugarRoll.be Fact Sheet for Healthcare Providers: https://www.woods-mathews.com/ This test is not yet approved or cleared by the [place] FDA and  has been authorized for detection and/or diagnosis of SARS-CoV-2 by FDA under an Emergency Use Authorization (EUA). This EUA will remain  in effect (meaning this test can be used) for the duration of the COVID-19 declaration under Section 56  4(b)(1) of the Act, 21 U.S.C. section 360bbb-3(b)(1), unless the authorization is terminated or revoked sooner. Performed at [facility], [place] [address] [place], [place] [address]   MRSA PCR Screening     Status: None   Collection Time: 09/17/19 11:12 PM   Specimen: Nasopharyngeal  Result Value Ref Range Status   MRSA by PCR NEGATIVE NEGATIVE Final    Comment:        The GeneXpert MRSA Assay (FDA approved for NASAL specimens only), is one component of a comprehensive MRSA colonization surveillance program. It is not intended to diagnose MRSA infection nor to guide or monitor treatment for MRSA infections. Performed at [facility], [place] [address] [place], [place] [address]     Radiology Studies: No results found.  45 minutes with more than 50% spent in reviewing records, counseling  patient/family and coordinating care.   Gisele Pack T. Mason  If 7PM-7AM, please contact night-coverage [email or web address] Password [facility] 09/19/2019, 11:36 AM

## 2019-09-19 NOTE — Evaluation (Signed)
Occupational Therapy Evaluation Patient Details Name: Robert Little MRN: [id number] DOB: Mar 17, 1941 Today's Date: 09/19/2019    History of Present Illness 79 y.o. male with medical history significant for chronic anemia, pulmonary fibrosis, hypertension, coronary artery disease, chronic diastolic CHF, and schizoaffective disorder, presenting to the emergency department with progressive shortness of breath long with severe orthopnea starting 1 to 2 days prior to hospital visit, in the hospital work-up suggestive of CHF and acute hypoxic respiratory failure and he was admitted 09/16/19   Clinical Impression   Pt PTA: Pt from group home. Pt currently performing ADL with minA overall. Pt currently performing mobility with minguardA to minA with RW. Pt tolerating session fairly well, but fatigues very easily. Pt limited by decreased activity tolerance and decreased ability to care for self.  8L O2- 10L O2 to >90% SpO2; pt desatting on 8 L with activity in mid 80s with decent pleth reading. Pt on 6L O2 at rest  >94% O2. Pt would benefit from continued OT skilled services for ADL, mobility and safety. OT following acutely.      Follow Up Recommendations  Home health OT;Supervision - Intermittent    Equipment Recommendations  None recommended by OT    Recommendations for Other Services       Precautions / Restrictions Precautions Precautions: Fall;Other (comment) Precaution Comments: watch O2 Restrictions Weight Bearing Restrictions: No      Mobility Bed Mobility Overal bed mobility: Needs Assistance Bed Mobility: Supine to Sit     Supine to sit: Min guard     General bed mobility comments: use of rail  and verbal cues to scoot to EOB  Transfers Overall transfer level: Needs assistance Equipment used: Rolling walker (2 wheeled) Transfers: Sit to/from Stand Sit to Stand: Min guard         General transfer comment: min guard for safety, cues for hand placement     Balance  Overall balance assessment: Needs assistance Sitting-balance support: No upper extremity supported;Feet supported Sitting balance-Leahy Scale: Good     Standing balance support: Bilateral upper extremity supported Standing balance-Leahy Scale: Fair                             ADL either performed or assessed with clinical judgement   ADL Overall ADL's : Needs assistance/impaired Eating/Feeding: Supervision/ safety;Sitting   Grooming: Supervision/safety;Sitting   Upper Body Bathing: Set up;Sitting   Lower Body Bathing: Minimal assistance;Cueing for safety;Sitting/lateral leans;Sit to/from stand   Upper Body Dressing : Set up;Sitting   Lower Body Dressing: Minimal assistance;Cueing for safety;Cueing for sequencing;Sitting/lateral leans;Sit to/from stand   Toilet Transfer: Min guard;Ambulation;RW   Toileting- Clothing Manipulation and Hygiene: Minimal assistance;Cueing for safety;Sitting/lateral lean;Sit to/from stand       Functional mobility during ADLs: Min guard;Minimal assistance;Rolling walker;Cueing for safety General ADL Comments: Pt limited by decreased activity tolerance and decreased ability to care for self.      Vision Baseline Vision/History: No visual deficits Patient Visual Report: No change from baseline Vision Assessment?: No apparent visual deficits     Perception     Praxis      Pertinent Vitals/Pain       Hand Dominance Right   Extremity/Trunk Assessment Upper Extremity Assessment Upper Extremity Assessment: Generalized weakness   Lower Extremity Assessment Lower Extremity Assessment: Generalized weakness   Cervical / Trunk Assessment Cervical / Trunk Assessment: Normal   Communication Communication Communication: Other (comment)(difficult to underatnd)   Cognition Arousal/Alertness: Awake/alert  Behavior During Therapy: WFL for tasks assessed/performed Overall Cognitive Status: No family/caregiver present to determine  baseline cognitive functioning                                 General Comments: follows commands consistently; talks in a hard to understand dialect or lack of teeth.   General Comments  Pt ambulating 54' with RW and minguardA on 8L O2- 10L O2 to >90% SpO2; pt desatting on 8 L with activity in mid 80s with decent pleth reading. Pt on 6L O2 at rest  >94% O2.    Exercises     Shoulder Instructions      Home Living Family/patient expects to be discharged to:: Group home Living Arrangements: Group Home Available Help at Discharge: Other (Comment);Available 24 hours/day(caregiver) Type of Home: Group Home Home Access: Stairs to enter Entrance Stairs-Number of Steps: 3 Entrance Stairs-Rails: Right;Left Home Layout: One level     Bathroom Shower/Tub: Teacher, early years/pre: Standard Bathroom Accessibility: Yes   Home Equipment: Tub bench;Grab bars - tub/shower;Hand held shower head;Walker - 2 wheels          Prior Functioning/Environment Level of Independence: Needs assistance  Gait / Transfers Assistance Needed: amb with RW and supervision ADL's / Homemaking Assistance Needed: assist from caregivers per pt            OT Problem List: Decreased strength;Decreased activity tolerance;Impaired balance (sitting and/or standing);Decreased safety awareness      OT Treatment/Interventions: Self-care/ADL training;Therapeutic exercise;Energy conservation;DME and/or AE instruction;Therapeutic activities;Patient/family education;Balance training    OT Goals(Current goals can be found in the care plan section) Acute Rehab OT Goals Patient Stated Goal: go home OT Goal Formulation: With patient Time For Goal Achievement: 10/03/19 Potential to Achieve Goals: Good ADL Goals Pt Will Perform Grooming: with supervision;standing Pt Will Perform Lower Body Dressing: with min guard assist;sit to/from stand;sitting/lateral leans Pt/caregiver will Perform Home  Exercise Program: Increased strength;Both right and left upper extremity;With Supervision Additional ADL Goal #1: Pt will increase to supervisionA for OOB ADL tasks with good safety awareness.  OT Frequency: Min 2X/week   Barriers to D/C:            Co-evaluation              AM-PAC OT "6 Clicks" Daily Activity     Outcome Measure Help from another person eating meals?: None Help from another person taking care of personal grooming?: A Little Help from another person toileting, which includes using toliet, bedpan, or urinal?: A Little Help from another person bathing (including washing, rinsing, drying)?: A Little Help from another person to put on and taking off regular upper body clothing?: None Help from another person to put on and taking off regular lower body clothing?: A Little 6 Click Score: 20   End of Session Equipment Utilized During Treatment: Rolling walker;Oxygen;Gait belt Nurse Communication: Mobility status  Activity Tolerance: Patient limited by fatigue Patient left: in chair;with call bell/phone within reach;with chair alarm set  OT Visit Diagnosis: Unsteadiness on feet (R26.81);Muscle weakness (generalized) (M62.81)                Time: 6384-6659 OT Time Calculation (min): 43 min Charges:  OT General Charges $OT Visit: 1 Visit OT Evaluation $OT Eval Moderate Complexity: 1 Mod OT Treatments $Self Care/Home Management : 8-22 mins $Therapeutic Activity: 8-22 mins  Jefferey Pica, OTR/L Acute Rehabilitation Services Pager: [phone]  Office: Gordon C 09/19/2019, 5:17 PM

## 2019-09-19 NOTE — Plan of Care (Signed)

## 2019-09-19 NOTE — Progress Notes (Signed)
Modified Barium Swallow Progress Note  Patient Details  Name: Robert Little MRN: [id number] Date of Birth: 1941-05-23  Today's Date: 09/19/2019  Modified Barium Swallow completed.  Full report located under Chart Review in the Imaging Section.  Brief recommendations include the following:  Clinical Impression  Pt presents with mild pharyngeal dysphagia characterized by a pharyngeal delay which facilitated penetration (PAS 3) with thin liquids and with consecutive swallows of nectar thick liquids via straw. Penetration was improved to a PAS level 2 with individual sips of nectar thick liquids. No instances of aspiration were noted but pt is at risk for aspiration with thin liquids and with use of consecutive swallows. Considering the appreciated aspiration risk combined with his reports, it is recommended that the current diet of dysphagia 3 solids and nectar thick liquids via cup be continued at this time. SLP will follow for dysphagia treatment.   Swallow Evaluation Recommendations       SLP Diet Recommendations: Dysphagia 2 (Fine chop) solids;Nectar thick liquid   Liquid Administration via: Cup;No straw   Medication Administration: Whole meds with puree   Supervision: Intermittent supervision to cue for compensatory strategies   Compensations: Slow rate;Small sips/bites   Postural Changes: Seated upright at 90 degrees   Oral Care Recommendations: Oral care BID   Other Recommendations: Order thickener from [place] [facility], [place], [place] Office number [phone] Pager [phone]  Horton Marshall 09/19/2019,3:01 PM

## 2019-09-19 NOTE — Consult Note (Signed)
NAME:  Robert Little, MRN:  [id number], DOB:  05-Apr-1941, LOS: 2 ADMISSION DATE:  09/16/2019, CONSULTATION DATE:  09/19/19 REFERRING MD:  Dr. Cyndia Skeeters / [facility], CHIEF COMPLAINT:  Dyspnea, Hypoxia   Brief History   79 y/o M, group home resident, admitted 3/14 with complaints of SOB.    History of present illness   79 y/o M, group home resident, who presented to [facility] on 3/14 with reports of shortness of breath.   The patient reported progressive SOB over the 24 hour period prior to EMS call. Upon EMS evaluation, the patient was noted to have saturations in the 80's on RA. On arrival to ER, he was afebrile, requiring 4L O2 to maintain saturations in the low 90's, tachypneic with normal blood pressure.  EKG unremarkable.  Labs noted AKI, troponin 80, BNP 2300, D-Dimer 5.68, and normocytic anemia. COVID antigen negative. CXR raised concern for interstitial densities.  He was admitted per [facility] for further evaluation.  He was treated with lasix & bronchodilators.  LE dopplers assessed and negative. VQ scan ordered and pending. There were concerns for aspiration.  Swallow evaluation pending.  He was treated for suspected aspiration with unasyn.    PCCM consulted for evaluation of hypoxemic respiratory failure.    The patient reports he began smoking at age 38, quit age 78.  Smoked up to 4-5 packs per day at his heaviest.  He quit due to expense of tobacco.  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   Past Medical History  GERD  HTN CAD Chronic dCHF Schizophrenia  Anemia  Pulmonary fibrosis - not defined, no CT imaging in Epic, no [place] [facility]   3/14 Admit  3/17 PCCM consulted   Consults:  PCCM  Cardiology   Procedures:    Significant Diagnostic Tests:   TTE 3/4 >> LVEF ~ 60-65%, normal LV function, no RWMA, grade II diastolic dysfunction, elevated LA pressure, RV systolic pressure normal, LA moderately dilated, trivial MVR, mild aortic  valve sclerosis without stenosis  BLE Venous Doppler 3/15 >> negative for DVT  Urine Na 93, Cr 53.70 / FENa >3  MBS 3/17 >>   Micro Data:  UA 3/15 >> rare bacteria, WBC 0-5 COVID 3/15 >> negative   Antimicrobials:  Unasyn 3/16 >>   Interim history/subjective:  Afebrile I/O 2.5L UOP, net neg 1.4L in 24h  Objective   Blood pressure (!) 105/40, pulse 62, temperature (!) 96.9 F (36.1 C), temperature source Axillary, resp. rate 20, height 5\' 8"  (1.727 m), weight 68.3 kg, SpO2 95 %.        Intake/Output Summary (Last 24 hours) at 09/19/2019 1251 Last data filed at 09/19/2019 0700 Gross per 24 hour  Intake 830 ml  Output 1000 ml  Net -170 ml   Filed Weights   09/17/19 2242 09/18/19 0300 09/19/19 0650  Weight: 71.5 kg 71.5 kg 68.3 kg    Examination: General: chronically ill appearing adult male in NAD sitting up in bed  HEENT: MM pink/moist, poor dentition, pupils =/reactive  Neuro: Awake, alert, speech clear, HOH, interacts appropriately, MAE / normal strength CV: s1s2 RRR, SB on monitor, no m/r/g PULM:  Non-labored on [place] O2, O2 reduced to 2L while in room and sats remain above 92%.  Lungs bilaterally with posterior velcro crackles, occasional soft wheeze  GI: soft, bsx4 active  Extremities: warm/dry, no edema  Skin: no rashes or lesions  Resolved Hospital Problem list  Assessment & Plan:   Acute Hypoxemic Respiratory Failure Former Heavy Tobacco Abuse  Possible Aspiration PNA Admit with acute hypoxic respiratory failure, with new O2 requirement.  ~ 200 pk year hx of smoking + occupational exposures with welding / Architect.  No prior CT imaging.  CXR with concern for fibrotic changes with superimposed pulmonary edema, small R effusion.  Surprisingly, little hyperinflation changes. Elevated D-dimer on admit. LE venous duplex negative. O2 needs decreased with diuresis, noted desaturation at night which raises question of sleep disordered breathing. Note hx of  former heavy tobacco abuse.   -wean O2 for sats 88-95%, weaned while in room to 2L / RN aware -assess ambulatory O2 needs prior to discharge  -assess HRCT chest to evaluate for fibrosis pattern  -consider outpatient sleep evaluation  -will need outpatient PFT's -needs aggressive pulmonary hygiene - IS, mobilize  -SLP evaluation for dietary needs / aspiration precautions  -consider intermittent diuresis, hold for now with rise in sr cr -follow intermittent CXR -add brovana + pulmicort BID  -wean steroids to off / consider 5 days total   Elevated D-Dimer Trend improving, LE doppler negative.   -discontinue VQ scan > doubt PE, O2 needs decreasing with diuresis  CAD Grade II Diastolic Dysfunction  -per primary   AKI on CKD 4  AGMA FENa >3 suspect ATN -Trend BMP / urinary output -Replace electrolytes as indicated -Avoid nephrotoxic agents, ensure adequate renal perfusion  Anemia  -anemia work up per primary   Best practice:  Diet: D3 Diet  Pain/Anxiety/Delirium protocol (if indicated): n/a  VAP protocol (if indicated): n/a  DVT prophylaxis: heparin  GI prophylaxis: PPI  Glucose control: per primary  Mobility: as tolerated  Code Status: DNR  Family Communication: Patient updated on plan of care  Disposition: per primary   Labs   CBC: Recent Labs  Lab 09/16/19 2244 09/17/19 0827 09/18/19 0115 09/19/19 0240  WBC 8.7 9.2 8.5 12.8*  NEUTROABS 7.2  --   --   --   HGB 8.1* 7.7* 7.8* 7.7*  HCT 26.3* 25.0* 24.2* 23.9*  MCV 96.3 96.5 91.3 91.2  PLT 241 253 261 355    Basic Metabolic Panel: Recent Labs  Lab 09/16/19 2244 09/17/19 0827 09/18/19 0115 09/19/19 0240  NA 140 141 141 137  K 4.7 3.9 4.2 4.0  CL 107 104 103 98  CO2 16* 22 23 23   GLUCOSE 205* 160* 242* 287*  BUN 43* 44* 52* 68*  CREATININE 3.11* 3.05* 3.21* 3.46*  CALCIUM 6.9* 6.7* 6.8* 7.0*  MG  --  1.1* 1.0* 2.3   GFR: Estimated Creatinine Clearance: 17.3 mL/min (A) (by C-G formula based on SCr  of 3.46 mg/dL (H)). Recent Labs  Lab 09/16/19 2244 09/17/19 0827 09/18/19 0115 09/18/19 0739 09/19/19 0240  PROCALCITON  --   --   --  0.40 0.35  WBC 8.7 9.2 8.5  --  12.8*    Liver Function Tests: Recent Labs  Lab 09/16/19 2244  AST 50*  ALT 48*  ALKPHOS 86  BILITOT 0.6  PROT 6.4*  ALBUMIN 2.9*   No results for input(s): LIPASE, AMYLASE in the last 168 hours. No results for input(s): AMMONIA in the last 168 hours.  ABG No results found for: PHART, PCO2ART, PO2ART, HCO3, TCO2, ACIDBASEDEF, O2SAT   Coagulation Profile: No results for input(s): INR, PROTIME in the last 168 hours.  Cardiac Enzymes: No results for input(s): CKTOTAL, CKMB, CKMBINDEX, TROPONINI in the last 168 hours.  HbA1C: No results found for:  HGBA1C  CBG: Recent Labs  Lab 09/16/19 2241  GLUCAP 221*    Review of Systems: Positives in [place]    Gen: Denies fever, chills, weight change, fatigue, night sweats HEENT: Denies blurred vision, double vision, hearing loss, tinnitus, sinus congestion, rhinorrhea, sore throat, neck stiffness, dysphagia PULM: Denies shortness of breath, predominantly dry cough, sputum production, hemoptysis, wheezing CV: Denies chest pain, edema, orthopnea, paroxysmal nocturnal dyspnea, palpitations GI: Denies abdominal pain, nausea, vomiting, diarrhea, hematochezia, melena, constipation, change in bowel habits GU: Denies dysuria, hematuria, polyuria, oliguria, urethral discharge Endocrine: Denies hot or cold intolerance, polyuria, polyphagia or appetite change Derm: Denies rash, dry skin, scaling or peeling skin change Heme: Denies easy bruising, bleeding, bleeding gums Neuro: Denies headache, numbness, weakness, slurred speech, loss of memory or consciousness  Past Medical History  He,  has a past medical history of Anemia, Coronary artery disease, GERD (gastroesophageal reflux disease), Hypertension, Pulmonary fibrosis ([place]), and Schizo affective schizophrenia ([place]).    Surgical History    Past Surgical History:  Procedure Laterality Date  . CORONARY ARTERY BYPASS GRAFT       Social History   reports that he has quit smoking. His smoking use included cigarettes. He has never used smokeless tobacco. He reports previous alcohol use. He reports previous drug use.   Family History   His family history includes Diabetes Mellitus II in his maternal grandmother and mother; Heart disease in his maternal grandmother.   Allergies No Known Allergies   Home Medications  Prior to Admission medications   Medication Sig Start Date End Date Taking? Authorizing Provider  acetaminophen (TYLENOL) 325 MG tablet Take 2 tablets (650 mg total) by mouth every 6 (six) hours as needed for mild pain (or Fever >/= 101). 05/18/18  Yes Debbe Odea, MD  aspirin 325 MG tablet Take 325 mg by mouth daily.   Yes [provider]  atenolol (TENORMIN) 25 MG tablet Take 25 mg by mouth daily.   Yes [provider]  atorvastatin (LIPITOR) 20 MG tablet Take 20 mg by mouth at bedtime.   Yes [provider]  benztropine (COGENTIN) 0.5 MG tablet Take 0.5 mg by mouth 2 (two) times daily.   Yes [provider]  calcium gluconate 500 MG tablet Take 1 tablet (500 mg total) by mouth 2 (two) times daily. 09/07/19  Yes Patrecia Pour, MD  clonazePAM (KLONOPIN) 0.5 MG tablet Take 0.5 mg by mouth 2 (two) times daily.    Yes [provider]  Dexlansoprazole (DEXILANT) 30 MG capsule Take 30 mg by mouth daily.   Yes [provider]  Multiple Vitamins-Minerals ([employer] SPECTRAVITE PO) Take 1 tablet by mouth daily.   Yes [provider]  tamsulosin (FLOMAX) 0.4 MG CAPS capsule Take 1 capsule (0.4 mg total) by mouth daily after supper. Patient taking differently: Take 0.4 mg by mouth daily.  05/18/18  Yes Debbe Odea, MD     Critical care time: n/a    Noe Gens, MSN, NP-C [facility] 09/19/2019, 12:51 PM   Please  see [email or web address] for pager details.

## 2019-09-20 DIAGNOSIS — J84112 Idiopathic pulmonary fibrosis: Secondary | ICD-10-CM

## 2019-09-20 DIAGNOSIS — R911 Solitary pulmonary nodule: Secondary | ICD-10-CM

## 2019-09-20 DIAGNOSIS — I5033 Acute on chronic diastolic (congestive) heart failure: Secondary | ICD-10-CM

## 2019-09-20 DIAGNOSIS — I2510 Atherosclerotic heart disease of native coronary artery without angina pectoris: Secondary | ICD-10-CM

## 2019-09-20 DIAGNOSIS — J8410 Pulmonary fibrosis, unspecified: Secondary | ICD-10-CM

## 2019-09-20 DIAGNOSIS — N179 Acute kidney failure, unspecified: Secondary | ICD-10-CM

## 2019-09-20 DIAGNOSIS — N184 Chronic kidney disease, stage 4 (severe): Secondary | ICD-10-CM

## 2019-09-20 DIAGNOSIS — R739 Hyperglycemia, unspecified: Secondary | ICD-10-CM

## 2019-09-20 LAB — PROCALCITONIN: Procalcitonin: 0.21 ng/mL

## 2019-09-20 LAB — HEMOGLOBIN A1C
Hgb A1c MFr Bld: 7 % — ABNORMAL HIGH (ref 4.8–5.6)
Mean Plasma Glucose: 154.2 mg/dL

## 2019-09-20 LAB — BASIC METABOLIC PANEL
Anion gap: 16 — ABNORMAL HIGH (ref 5–15)
BUN: 73 mg/dL — ABNORMAL HIGH (ref 8–23)
CO2: 23 mmol/L (ref 22–32)
Calcium: 6.9 mg/dL — ABNORMAL LOW (ref 8.9–10.3)
Chloride: 97 mmol/L — ABNORMAL LOW (ref 98–111)
Creatinine, Ser: 3.29 mg/dL — ABNORMAL HIGH (ref 0.61–1.24)
GFR calc Af Amer: 20 mL/min — ABNORMAL LOW (ref >60)
GFR calc non Af Amer: 17 mL/min — ABNORMAL LOW (ref >60)
Glucose, Bld: 329 mg/dL — ABNORMAL HIGH (ref 70–99)
Potassium: 4.3 mmol/L (ref 3.5–5.1)
Sodium: 136 mmol/L (ref 135–145)

## 2019-09-20 LAB — CBC
HCT: 24.7 % — ABNORMAL LOW (ref 39.0–52.0)
Hemoglobin: 7.9 g/dL — ABNORMAL LOW (ref 13.0–17.0)
MCH: 29 pg (ref 26.0–34.0)
MCHC: 32 g/dL (ref 30.0–36.0)
MCV: 90.8 fL (ref 80.0–100.0)
Platelets: 301 10*3/uL (ref 150–400)
RBC: 2.72 MIL/uL — ABNORMAL LOW (ref 4.22–5.81)
RDW: 14 % (ref 11.5–15.5)
WBC: 12.5 10*3/uL — ABNORMAL HIGH (ref 4.0–10.5)
nRBC: 0 % (ref 0.0–0.2)

## 2019-09-20 LAB — C-REACTIVE PROTEIN: CRP: 12.3 mg/dL — ABNORMAL HIGH (ref <1.0)

## 2019-09-20 LAB — GLUCOSE, CAPILLARY
Glucose-Capillary: 312 mg/dL — ABNORMAL HIGH (ref 70–99)
Glucose-Capillary: 315 mg/dL — ABNORMAL HIGH (ref 70–99)
Glucose-Capillary: 287 mg/dL — ABNORMAL HIGH (ref 70–99)
Glucose-Capillary: 243 mg/dL — ABNORMAL HIGH (ref 70–99)
Glucose-Capillary: 215 mg/dL — ABNORMAL HIGH (ref 70–99)

## 2019-09-20 LAB — MAGNESIUM: Magnesium: 2 mg/dL (ref 1.7–2.4)

## 2019-09-20 LAB — D-DIMER, QUANTITATIVE: D-Dimer, Quant: 2.4 ug/mL-FEU — ABNORMAL HIGH (ref 0.00–0.50)

## 2019-09-20 LAB — BRAIN NATRIURETIC PEPTIDE: B Natriuretic Peptide: 1053.7 pg/mL — ABNORMAL HIGH (ref 0.0–100.0)

## 2019-09-20 MED ORDER — ADULT MULTIVITAMIN W/MINERALS CH
1.0000 | ORAL_TABLET | Freq: Every day | ORAL | Status: DC
  Administered 2019-09-20 – 2019-09-21 (×2): 1 via ORAL
  Filled 2019-09-20 (×2): qty 1

## 2019-09-20 MED ORDER — UMECLIDINIUM BROMIDE 62.5 MCG/INH IN AEPB
1.0000 | INHALATION_SPRAY | Freq: Every day | RESPIRATORY_TRACT | Status: DC
  Filled 2019-09-20 (×2): qty 7

## 2019-09-20 MED ORDER — FLUTICASONE FUROATE-VILANTEROL 200-25 MCG/INH IN AEPB
1.0000 | INHALATION_SPRAY | Freq: Every day | RESPIRATORY_TRACT | Status: DC
  Filled 2019-09-20 (×2): qty 28

## 2019-09-20 MED ORDER — PRO-STAT SUGAR FREE PO LIQD
30.0000 mL | Freq: Two times a day (BID) | ORAL | Status: DC
  Administered 2019-09-20 (×2): 30 mL via ORAL
  Filled 2019-09-20 (×3): qty 30

## 2019-09-20 MED ORDER — INSULIN ASPART 100 UNIT/ML ~~LOC~~ SOLN
0.0000 [IU] | Freq: Three times a day (TID) | SUBCUTANEOUS | Status: DC
  Administered 2019-09-20: 12:00:00 5 [IU] via SUBCUTANEOUS
  Administered 2019-09-20: 08:00:00 7 [IU] via SUBCUTANEOUS
  Administered 2019-09-20: 3 [IU] via SUBCUTANEOUS
  Administered 2019-09-21: 2 [IU] via SUBCUTANEOUS

## 2019-09-20 MED ORDER — INSULIN ASPART 100 UNIT/ML ~~LOC~~ SOLN
0.0000 [IU] | Freq: Every day | SUBCUTANEOUS | Status: DC
  Administered 2019-09-20: 2 [IU] via SUBCUTANEOUS

## 2019-09-20 NOTE — Progress Notes (Signed)
PROGRESS NOTE  Robert Little HYQ:[id number] DOB: 1940/07/18   PCP: Patient, No Pcp Per  Patient is from: Group home  DOA: 09/16/2019 LOS: 3  Brief Narrative / Interim history: 79 year old male with history of CAD, diastolic CHF, pulmonary fibrosis, HTN, chronic anemia, schizoaffective disorder presenting with progressive shortness of breath and orthopnea for 2 days, and admitted for acute hypoxic respiratory failure due to diastolic CHF exacerbation, and possible aspiration pneumonia.  Started on IV Lasix and IV Unasyn.   PCCM and cardiology consulted on 3/17.  Subjective: Seen and examined this morning.  No major events overnight or this morning.  Sitting on bedside chair watching TV.  Saturating in low 90s on 3 L by nasal cannula.  He denies chest pain, shortness of breath, cough or GI symptoms.  Fairly oriented but not a great historian.  Objective: Vitals:   09/20/19 0330 09/20/19 0340 09/20/19 0344 09/20/19 0727  BP:   (!) 125/55 (!) 151/69  Pulse:   (!) 59 (!) 57  Resp:   (!) 22 18  Temp:   97.6 F (36.4 C) 97.9 F (36.6 C)  TempSrc:   Axillary Oral  SpO2: (!) 88% (!) 88% 96% 94%  Weight:      Height:        Intake/Output Summary (Last 24 hours) at 09/20/2019 1031 Last data filed at 09/20/2019 0800 Gross per 24 hour  Intake 830 ml  Output 1575 ml  Net -745 ml   Filed Weights   09/18/19 0300 09/19/19 0650 09/20/19 0307  Weight: 71.5 kg 68.3 kg 69.9 kg    Examination:  GENERAL: No acute distress.  Appears well.  HEENT: MMM.  Vision and hearing grossly intact.  NECK: Supple.  No apparent JVD.  RESP: 93% on 3 L by [place].  No IWOB.  Good aeration.  No crackles or wheeze. CVS:  RRR. Heart sounds normal.  ABD/GI/GU: Bowel sounds present. Soft. Non tender.  MSK/EXT:  Moves extremities. No apparent deformity. No edema.  SKIN: no apparent skin lesion or wound NEURO: Awake, alert and oriented fairly..  No apparent focal neuro deficit. PSYCH: Calm. Normal  affect.  Procedures:  None  Assessment & Plan: Acute respiratory failure with hypoxia due to diastolic CHF exacerbation,  aspiration pneumonia, pulmonary fibrosis and anemia.  Saturating low 90s on 3 L by [place] at rest.  Not on oxygen at home. -Wean oxygen as able-goal saturation above 88% -Encourage incentive spirometry -Continue dexamethasone for a week -Treat CHF and aspiration pneumonia as below. -Continue Anoro Ellipta -Appreciate PCCM input.  Acute on chronic diastolic CHF: Echo on 03/09/2840 with EF of 60 to 65%, G2-DD and moderate LAE.  Initial CXR with interval increase in interstitial densities.  Repeat CXR concerning for pulmonary edema.  BNP 2300 (no prior to compare to)> 1000.  Diuretics held due to renal function and soft blood pressures.  Had 1.5 L UOP/24 hours without diuretics.  He appears euvolemic on exam today.  Renal function improving. -Continue holding Lasix -Monitor fluid status, renal function and electrolytes -Sodium and fluid restrictions -Cardiology consulted 3/17-awaiting recommendations.  Aspiration pneumonia in the setting of dysphagia-patient with elevated inflammatory markers and procalcitonin.  COVID-19 negative.   -SLP recommended dysphagia 2 diet.  -Aspiration precautions -Continue IV Unasyn for possible aspiration pneumonia  Idiopathic pulmonary fibrosis: CT high-resolution concerning for this. -Appreciate PCCM recommendations  Hypotension: Soft blood pressures this morning. -Hold Imdur, diuretic and metoprolol today  AKI on CKD-4/azotemia: baseline Cr 2.5-2.6> 3.11 (admit)> 3.46> 3.3.  Could  be due to hypotension, diuretics and/or high-dose aspirin.  Renal ultrasound without acute finding. -Hold Imdur, metoprolol and Lasix -Continue monitoring -Doubt candidacy for dialysis if renal function continues to get worse  Anemia of renal disease: Baseline Hgb 8-9> 8.1 (admit)>> 7.7> 7.9.  Low iron sat with high ferritin suggestive for some acute  inflammatory process. -Continue iron supplementation. -Continue monitoring  Hyperglycemia without diagnosis of diabetes: Likely due to steroid. Recent Labs    09/20/19 0749 09/20/19 0751  GLUCAP 312* 315*  -CBG monitoring and SSI -Check hemoglobin A1c   Elevated troponin in patient with history of CAD: mildly elevated  HS trop 102>>>123 likely demand ischemia from CHF vs ACS.  No chest pain.  EKG with diffuse T wave changes.  Echo as above. -Hold home metoprolol in the setting of soft blood pressures -Continue statin and aspirin  Pulmonary nodule: CT chest revealed 1.7 cm RLL nodule opacity -Follow-up noncontrast CT in 3 to 6 months recommended.  Schizoaffective disorder with bipolar: Stable -Continue home medications  Elevated D-dimer: nonspecific especially in the setting of renal failure.  Lower extremity Doppler negative for DVT. Transitioned from IV heparin to subcu heparin by previous attending as suspicion for PE was low.  However, patient continues to have significant O2 requirement.   History of BPH: No LUTS.  Renal ultrasound without acute finding. -Monitor urine output  Debility/physical deconditioning -PT/OT eval   Nutrition: Significant muscle mass wasting -Dietitian consulted.               DVT prophylaxis: Subcu heparin Code Status: DNR/DNI Family Communication: Patient and/or RN. Updated [name] (care giver) on 3/17  Discharge barrier: Respiratory failure with high oxygen requirement and AKI Patient is from: Group home Final disposition: To be determined based on clinical course  Consultants: Cardiology, PCCM   Microbiology summarized: COVID-19 screen negative. MRSA PCR negative.  Sch Meds:  Scheduled Meds: . atorvastatin  20 mg Oral q1800  . benztropine  0.5 mg Oral BID  . dexamethasone (DECADRON) injection  4 mg Intravenous Q24H  . ferrous sulfate  325 mg Oral TID WC  . heparin injection (subcutaneous)  5,000 Units  Subcutaneous Q8H  . insulin aspart  0-5 Units Subcutaneous QHS  . insulin aspart  0-9 Units Subcutaneous TID WC  . pantoprazole  40 mg Oral Daily  . tamsulosin  0.4 mg Oral Daily   Continuous Infusions:  PRN Meds:.acetaminophen, clonazePAM, guaiFENesin-dextromethorphan, ondansetron (ZOFRAN) IV, Resource ThickenUp Clear  Antimicrobials: Anti-infectives (From admission, onward)   Start     Dose/Rate Route Frequency Ordered Stop   09/18/19 1000  Ampicillin-Sulbactam (UNASYN) 3 g in sodium chloride 0.9 % 100 mL IVPB  Status:  Discontinued     3 g 200 mL/hr over 30 Minutes Intravenous Every 12 hours 09/18/19 0926 09/19/19 1454       I have personally reviewed the following labs and images: CBC: Recent Labs  Lab 09/16/19 2244 09/17/19 0827 09/18/19 0115 09/19/19 0240 09/20/19 0238  WBC 8.7 9.2 8.5 12.8* 12.5*  NEUTROABS 7.2  --   --   --   --   HGB 8.1* 7.7* 7.8* 7.7* 7.9*  HCT 26.3* 25.0* 24.2* 23.9* 24.7*  MCV 96.3 96.5 91.3 91.2 90.8  PLT 241 253 261 293 301   BMP &GFR Recent Labs  Lab 09/16/19 2244 09/17/19 0827 09/18/19 0115 09/19/19 0240 09/20/19 0238  NA 140 141 141 137 136  K 4.7 3.9 4.2 4.0 4.3  CL 107 104 103 98 97*  CO2 16* 22 23 23 23   GLUCOSE 205* 160* 242* 287* 329*  BUN 43* 44* 52* 68* 73*  CREATININE 3.11* 3.05* 3.21* 3.46* 3.29*  CALCIUM 6.9* 6.7* 6.8* 7.0* 6.9*  MG  --  1.1* 1.0* 2.3 2.0   Estimated Creatinine Clearance: 18.2 mL/min (A) (by C-G formula based on SCr of 3.29 mg/dL (H)). Liver & Pancreas: Recent Labs  Lab 09/16/19 2244  AST 50*  ALT 48*  ALKPHOS 86  BILITOT 0.6  PROT 6.4*  ALBUMIN 2.9*   No results for input(s): LIPASE, AMYLASE in the last 168 hours. No results for input(s): AMMONIA in the last 168 hours. Diabetic: Recent Labs    09/20/19 0238  HGBA1C 7.0*   Recent Labs  Lab 09/16/19 2241 09/20/19 0749 09/20/19 0751  GLUCAP 221* 312* 315*   Cardiac Enzymes: No results for input(s): CKTOTAL, CKMB, CKMBINDEX,  TROPONINI in the last 168 hours. No results for input(s): PROBNP in the last 8760 hours. Coagulation Profile: No results for input(s): INR, PROTIME in the last 168 hours. Thyroid Function Tests: No results for input(s): TSH, T4TOTAL, FREET4, T3FREE, THYROIDAB in the last 72 hours. Lipid Profile: No results for input(s): CHOL, HDL, LDLCALC, TRIG, CHOLHDL, LDLDIRECT in the last 72 hours. Anemia Panel: Recent Labs    09/18/19 1132  VITAMINB12 468  FOLATE 10.7  FERRITIN 1,124*  TIBC 216*  IRON 25*  RETICCTPCT 3.4*   Urine analysis:    Component Value Date/Time   COLORURINE STRAW (A) 09/17/2019 0415   APPEARANCEUR CLEAR 09/17/2019 0415   LABSPEC 1.009 09/17/2019 0415   PHURINE 5.0 09/17/2019 [place] 09/17/2019 0415   HGBUR NEGATIVE 09/17/2019 0415   BILIRUBINUR NEGATIVE 09/17/2019 0415   KETONESUR NEGATIVE 09/17/2019 0415   PROTEINUR NEGATIVE 09/17/2019 0415   NITRITE NEGATIVE 09/17/2019 0415   LEUKOCYTESUR NEGATIVE 09/17/2019 0415   Sepsis Labs: Invalid input(s): PROCALCITONIN, [place]  Microbiology: Recent Results (from the past 240 hour(s))  SARS CORONAVIRUS 2 (TAT 6-24 HRS) Nasopharyngeal Nasopharyngeal Swab     Status: None   Collection Time: 09/17/19  2:24 AM   Specimen: Nasopharyngeal Swab  Result Value Ref Range Status   SARS Coronavirus 2 NEGATIVE NEGATIVE Final    Comment: (NOTE) SARS-CoV-2 target nucleic acids are NOT DETECTED. The SARS-CoV-2 RNA is generally detectable in upper and lower respiratory specimens during the acute phase of infection. Negative results do not preclude SARS-CoV-2 infection, do not rule out co-infections with other pathogens, and should not be used as the sole basis for treatment or other patient management decisions. Negative results must be combined with clinical observations, patient history, and epidemiological information. The expected result is Negative. Fact Sheet for  Patients: SugarRoll.be Fact Sheet for Healthcare Providers: https://www.woods-mathews.com/ This test is not yet approved or cleared by the [place] FDA and  has been authorized for detection and/or diagnosis of SARS-CoV-2 by FDA under an Emergency Use Authorization (EUA). This EUA will remain  in effect (meaning this test can be used) for the duration of the COVID-19 declaration under Section 56 4(b)(1) of the Act, 21 U.S.C. section 360bbb-3(b)(1), unless the authorization is terminated or revoked sooner. Performed at [facility], [place] [address] [place], [place] [address]   MRSA PCR Screening     Status: None   Collection Time: 09/17/19 11:12 PM   Specimen: Nasopharyngeal  Result Value Ref Range Status   MRSA by PCR NEGATIVE NEGATIVE Final    Comment:  The GeneXpert MRSA Assay (FDA approved for NASAL specimens only), is one component of a comprehensive MRSA colonization surveillance program. It is not intended to diagnose MRSA infection nor to guide or monitor treatment for MRSA infections. Performed at [facility], [place] [address], [place], [place] [address]     Radiology Studies: DG Chest 2 View  Result Date: 09/19/2019 CLINICAL DATA:  Shortness of breath for several weeks. EXAM: CHEST - 2 VIEW COMPARISON:  Single-view of the chest 09/17/2019. PA and lateral chest 05/15/2018. FINDINGS: The patient is status post CABG. Heart size is upper normal. The lungs appear emphysematous with fibrosis. No consolidative process, pneumothorax or effusion. Atherosclerosis noted. IMPRESSION: No acute disease. The lungs appear emphysematous with fibrosis. Atherosclerosis. Electronically Signed   By: Inge Rise M.D.   On: 09/19/2019 13:40   US RENAL  Result Date: 09/19/2019 CLINICAL DATA:  Acute kidney injury EXAM: RENAL / URINARY TRACT ULTRASOUND COMPLETE COMPARISON:  05/16/2018 FINDINGS: Right Kidney: Renal measurements:  9.5 x 3.9 x 4.2 cm = volume: 82 mL. Mild cortical thinning. Normal cortical echogenicity. Small simple cyst at inferior pole 1.3 x 1.5 x 1.8 cm. Additional small simple cyst at inferior pole 1.4 x 1.2 x 1.5 cm. No solid masses or hydronephrosis. No shadowing calcifications. Left Kidney: Renal measurements: 9.1 x 4.8 x 4.5 cm = volume: 102 mL. Cortical thinning. Upper normal cortical echogenicity. Exophytic simple cyst at inferior pole 3.6 x 3.4 x 3.1 cm. Additional simple exophytic cyst 1.8 x 1.9 x 1.7 cm. No solid mass or hydronephrosis. Bladder: Appears normal for degree of bladder distention. Other: N/A IMPRESSION: Mild age-related renal cortical thinning bilaterally. Small BILATERAL simple appearing renal cysts. No solid renal mass or hydronephrosis identified. Electronically Signed   By: Lavonia Dana M.D.   On: 09/19/2019 15:32   CT Chest High Resolution  Result Date: 09/19/2019 CLINICAL DATA:  Inpatient. Shortness of breath. Abnormal chest radiograph. Evaluate for interstitial lung disease. Former smoker per epic. EXAM: CT CHEST WITHOUT CONTRAST TECHNIQUE: Multidetector CT imaging of the chest was performed following the standard protocol without intravenous contrast. High resolution imaging of the lungs, as well as inspiratory and expiratory imaging, was performed. COMPARISON:  Chest radiograph from earlier today. FINDINGS: Cardiovascular: Top-normal heart size. No significant pericardial effusion/thickening. Three-vessel coronary atherosclerosis status post CABG. Atherosclerotic nonaneurysmal thoracic aorta. Normal caliber pulmonary arteries. Mediastinum/Nodes: No discrete thyroid nodules. Unremarkable esophagus. No pathologically enlarged axillary, mediastinal or hilar lymph nodes, noting limited sensitivity for the detection of hilar adenopathy on this noncontrast study. Lungs/Pleura: No pneumothorax. No pleural effusion. Subsolid 1.7 cm nodular opacity in right lower lobe (series 6/image 149). No acute  consolidative airspace disease, lung masses or additional significant pulmonary nodules. No significant lobular air trapping or evidence of tracheobronchomalacia on the expiration sequence. There is extensive patchy ground-glass opacity and peribronchovascular and subpleural reticulation throughout both lungs with associated mild-to-moderate traction bronchiectasis and architectural distortion. Peripheral and subpleural cystic changes throughout both lungs likely reflect a combination of emphysema and mild honeycombing. Findings are most prominent in the lower lobes. Upper abdomen: No acute abnormality. Musculoskeletal: No aggressive appearing focal osseous lesions. Several discontinuities throughout the sternotomy wires. Nonunion of mid to lower sternotomy. Moderate thoracic spondylosis. IMPRESSION: 1. Spectrum of findings compatible with fibrotic interstitial lung disease with mild-to-moderate traction bronchiectasis and probable mild honeycombing admixed with paraseptal emphysema. Prominent ground-glass component may represent a superimposed process such as pulmonary edema, atypical infection or alveolar hemorrhage. Underlying fibrotic process probably represents usual interstitial  pneumonia (UIP). Findings are categorized as probable UIP per consensus guidelines: Diagnosis of Idiopathic Pulmonary Fibrosis: An Official ATS/ERS/JRS/ALAT Clinical Practice Guideline. [place] Vol 198, Iss 5, [id number], Mar 05 2017. 2. Subsolid 1.7 cm right lower lobe nodular opacity. Follow-up non-contrast CT recommended at 3-6 months to confirm persistence. If unchanged, and solid component remains <6 mm, annual CT is recommended until 5 years of stability has been established. If persistent these nodules should be considered highly suspicious if the solid component of the nodule is 6 mm or greater in size and enlarging. This recommendation follows the consensus statement: Guidelines for Management of Incidental  Pulmonary Nodules Detected on CT Images: From the Fleischner Society 2017; Radiology 2017; 284:228-243. 3. Aortic Atherosclerosis (ICD10-I70.0) and Emphysema (ICD10-J43.9). Electronically Signed   By: Ilona Sorrel M.D.   On: 09/19/2019 20:30   DG Swallowing Func-Speech Pathology  Result Date: 09/19/2019 Objective Swallowing Evaluation: Type of Study: MBS-Modified Barium Swallow Study  Patient Details Name: Deaglan Lile MRN: [id number] Date of Birth: Jul 27, 1940 Today's Date: 09/19/2019 Time: SLP Start Time (ACUTE ONLY): 1310 -SLP Stop Time (ACUTE ONLY): 1327 SLP Time Calculation (min) (ACUTE ONLY): 17 min Past Medical History: Past Medical History: Diagnosis Date . Anemia  . Coronary artery disease  . GERD (gastroesophageal reflux disease)  . Hypertension  . Pulmonary fibrosis ([place])  . Schizo affective schizophrenia [facility])  Past Surgical History: Past Surgical History: Procedure Laterality Date . CORONARY ARTERY BYPASS GRAFT   HPI: Pt is a 79 y.o. male adm to [facility] with respiratory difficulties. Pt PMH + for GERD, schizoaffective d/o, pulmonary fibrosis, HLD, anemia, AKI, sepsis.  Pt desated to 75% during his lunch meal yesterday requiring increase in oxygen to 15 Liters on NRB.  Today he is on 8 Liters.  He is noted to have broken sternal wires per imaging.  Pt with possible developing effusion, pulmonary edema on fibrosis/ILD.  During the initial evaluation the pt reported dysphagia to liquids more than foods causing him to cough. He also states pills will lodge in his throat - pointing to distal region/ ? pyriform sinus.  He denied expectorating food or liquid and stated he can "get it down".   Per recent admit note, group home staff reports pt has periods of time when his intake is poor but pt denies specific reasoning for this issue. Chest x-ray 3/17: Pt is a 79 y.o. male adm to [facility] with respiratory difficulties. Pt PMH + for GERD, schizoaffective d/o, pulmonary fibrosis, HLD, anemia, AKI, sepsis.  Pt desated to  75% during his lunch meal yesterday requiring increase in oxygen to 15 Liters on NRB.  Today he is on 8 Liters.  He is noted to have broken sternal wires per imaging.  Pt with possible developing effusion, pulmonary edema on fibrosis/ILD.  During the initial evaluation the pt reported dysphagia to liquids more than foods causing him to cough. He also states pills will lodge in his throat - pointing to distal region/ ? pyriform sinus.  He denied expectorating food or liquid and stated he can "get it down".   Per recent admit note, group home staff reports pt has periods of time when his intake is poor but pt denies specific reasoning for this issue. Chest x-ray 3/17 was negative for acute changes.  Subjective: pt awake in bed Assessment / Plan / Recommendation CHL IP CLINICAL IMPRESSIONS 09/19/2019 Clinical Impression Pt presents with mild pharyngeal dysphagia characterized by a pharyngeal delay which facilitated penetration (PAS 3)  with thin liquids and with consecutive swallows of nectar thick liquids via straw. Penetration was improved to a PAS level 2 with individual sips of nectar thick liquids. No instances of aspiration were noted but pt is at risk for aspiration with thin liquids and with use of consecutive swallows. Considering the appreciated aspiration risk combined with his reports, it is recommended that the current diet of dysphagia 3 solids and nectar thick liquids via cup be continued at this time. SLP will follow for dysphagia treatment. SLP Visit Diagnosis Dysphagia, pharyngeal phase (R13.13) Attention and concentration deficit following -- Frontal lobe and executive function deficit following -- Impact on safety and function Mild aspiration risk   CHL IP TREATMENT RECOMMENDATION 09/19/2019 Treatment Recommendations Therapy as outlined in treatment plan below   Prognosis 09/19/2019 Prognosis for Safe Diet Advancement Good Barriers to Reach Goals Time post onset Barriers/Prognosis Comment -- CHL IP DIET  RECOMMENDATION 09/19/2019 SLP Diet Recommendations Dysphagia 2 (Fine chop) solids;Nectar thick liquid Liquid Administration via Cup;No straw Medication Administration Whole meds with puree Compensations Slow rate;Small sips/bites Postural Changes Seated upright at 90 degrees   CHL IP OTHER RECOMMENDATIONS 09/19/2019 Recommended Consults -- Oral Care Recommendations Oral care BID Other Recommendations Order thickener from pharmacy   CHL IP FOLLOW UP RECOMMENDATIONS 09/19/2019 Follow up Recommendations Home health SLP   CHL IP FREQUENCY AND DURATION 09/19/2019 Speech Therapy Frequency (ACUTE ONLY) min 2x/week Treatment Duration 2 weeks      CHL IP ORAL PHASE 09/19/2019 Oral Phase WFL Oral - Pudding Teaspoon -- Oral - Pudding Cup -- Oral - Honey Teaspoon -- Oral - Honey Cup -- Oral - Nectar Teaspoon -- Oral - Nectar Cup -- Oral - Nectar Straw -- Oral - Thin Teaspoon -- Oral - Thin Cup -- Oral - Thin Straw -- Oral - Puree -- Oral - Mech Soft -- Oral - Regular -- Oral - Multi-Consistency -- Oral - Pill -- Oral Phase - Comment --  CHL IP PHARYNGEAL PHASE 09/19/2019 Pharyngeal Phase Impaired Pharyngeal- Pudding Teaspoon -- Pharyngeal -- Pharyngeal- Pudding Cup -- Pharyngeal -- Pharyngeal- Honey Teaspoon -- Pharyngeal -- Pharyngeal- Honey Cup -- Pharyngeal -- Pharyngeal- Nectar Teaspoon -- Pharyngeal -- Pharyngeal- Nectar Cup -- Pharyngeal -- Pharyngeal- Nectar Straw -- Pharyngeal -- Pharyngeal- Thin Teaspoon -- Pharyngeal -- Pharyngeal- Thin Cup -- Pharyngeal -- Pharyngeal- Thin Straw -- Pharyngeal -- Pharyngeal- Puree -- Pharyngeal -- Pharyngeal- Mechanical Soft -- Pharyngeal -- Pharyngeal- Regular -- Pharyngeal -- Pharyngeal- Multi-consistency -- Pharyngeal -- Pharyngeal- Pill -- Pharyngeal -- Pharyngeal Comment --  Shanika I. Hardin Negus, [place], [place] Office number [phone] Pager [place] 09/19/2019, 3:05 PM               Dareth Andrew T. [place]  If 7PM-7AM,  please contact night-coverage [email or web address] Password [facility] 09/20/2019, 10:31 AM

## 2019-09-20 NOTE — Progress Notes (Signed)
Physical Therapy Treatment Patient Details Name: Robert Little MRN: [id number] DOB: 11-14-1940 Today's Date: 09/20/2019    History of Present Illness 79 y.o. male with medical history significant for chronic anemia, pulmonary fibrosis, hypertension, coronary artery disease, chronic diastolic CHF, and schizoaffective disorder, presenting to the emergency department with progressive shortness of breath long with severe orthopnea starting 1 to 2 days prior to hospital visit, in the hospital work-up suggestive of CHF and acute hypoxic respiratory failure and he was admitted 09/16/19    PT Comments    Pt received in bed on 3L O2 with SpO2 90%. He ambulated 150' with RW on 6L with desat to 85%. Recovered to 89% in recliner on 6L. O2 decreased to 3L with SpO2 remaining in upper 80s. Increased to 4L with improvement to 92%. Pt required continual cues for pursed lip breathing throughout session. He tends to be a mouth breather. Pt in recliner with feet elevated at end of session.    Follow Up Recommendations  Home health PT     Equipment Recommendations  None recommended by PT    Recommendations for Other Services       Precautions / Restrictions Precautions Precautions: Fall;Other (comment) Precaution Comments: watch O2    Mobility  Bed Mobility Overal bed mobility: Needs Assistance Bed Mobility: Supine to Sit     Supine to sit: Supervision     General bed mobility comments: +rail, supervision for safety  Transfers Overall transfer level: Needs assistance Equipment used: Rolling walker (2 wheeled) Transfers: Sit to/from Stand Sit to Stand: Min guard         General transfer comment: min guard for safety, cues for hand placement   Ambulation/Gait Ambulation/Gait assistance: Min guard;Min assist Gait Distance (Feet): 150 Feet Assistive device: Rolling walker (2 wheeled) Gait Pattern/deviations: Step-through pattern;Decreased stride length Gait velocity: decreased Gait  velocity interpretation: <1.31 ft/sec, indicative of household ambulator General Gait Details: min guard assist straight, open hallway; min assist turning and navigating around obstacles.   Stairs             Wheelchair Mobility    Modified Rankin (Stroke Patients Only)       Balance Overall balance assessment: Needs assistance Sitting-balance support: No upper extremity supported;Feet supported Sitting balance-Leahy Scale: Good     Standing balance support: Bilateral upper extremity supported;During functional activity Standing balance-Leahy Scale: Fair Standing balance comment: reliant on UE support for dynamic balance                            Cognition Arousal/Alertness: Awake/alert Behavior During Therapy: WFL for tasks assessed/performed Overall Cognitive Status: No family/caregiver present to determine baseline cognitive functioning                                 General Comments: Pleasant and cooperative. Follows commands consistently.      Exercises      General Comments General comments (skin integrity, edema, etc.): SpO2 90% at rest on 3L. Ambulated on 6 L with desat to 85%, recovering to 89% in recliner on 6L. After pt returned to 3L SpO2 remained in the upper 80s. Increased to 4L with improvement to 92%. RN notified.      Pertinent Vitals/Pain Pain Assessment: No/denies pain    Home Living  Prior Function            PT Goals (current goals can now be found in the care plan section) Acute Rehab PT Goals Patient Stated Goal: go home Progress towards PT goals: Progressing toward goals    Frequency    Min 3X/week      PT Plan Current plan remains appropriate    Co-evaluation              AM-PAC PT "6 Clicks" Mobility   Outcome Measure  Help needed turning from your back to your side while in a flat bed without using bedrails?: None Help needed moving from lying on your  back to sitting on the side of a flat bed without using bedrails?: A Little Help needed moving to and from a bed to a chair (including a wheelchair)?: A Little Help needed standing up from a chair using your arms (e.g., wheelchair or bedside chair)?: A Little Help needed to walk in hospital room?: A Little Help needed climbing 3-5 steps with a railing? : A Lot 6 Click Score: 18    End of Session Equipment Utilized During Treatment: Gait belt;Oxygen Activity Tolerance: Patient tolerated treatment well Patient left: in chair;with call bell/phone within reach;with chair alarm set Nurse Communication: Mobility status;Other (comment)(O2) PT Visit Diagnosis: Difficulty in walking, not elsewhere classified (R26.2);Unsteadiness on feet (R26.81)     Time: 9357-0177 PT Time Calculation (min) (ACUTE ONLY): 24 min  Charges:  $Gait Training: 23-37 mins                     Lorrin Goodell, [place]  Office # [phone] Pager [phone]    Lorriane Shire 09/20/2019, 9:55 AM

## 2019-09-20 NOTE — Consult Note (Addendum)
Cardiology Consultation:   Patient ID: Robert Little MRN: [id number]; DOB: 05/03/1941  Admit date: 09/16/2019 Date of Consult: 09/20/2019  Primary Care Provider: Patient, No Pcp Per Primary Cardiologist: Sanda Klein, MD  Primary Electrophysiologist:  None    Patient Profile:   Robert Little is a 79 y.o. male with a hx of CAD s/p prior CABG [facility] 02/2004), HTN, HLD, pulmonary fibrosis, schizoaffective disorder, CKD IV, anemia of chronic disease, and GERD  who is being seen today for the evaluation of dCHF at the request of Dr. Cyndia Skeeters.  History of Present Illness:   Robert Little has an extensive smoking history - smoking 4-5 ppd from age [age]. He lives in a group home and is dependent on most ADLs. Pt was recently hospitalized 09/05/19 for a near-syncopal event with hypotension. He was admitted and electrolytes replaced.  Orthostatic symptoms resolved. Echo obtained which revealed preserved EF, Grade 2 DD, and no significant valvular disease. CE enzymes trended and were negative for report of CP earlier in the day prior to near-syncope. He was discharged on 09/07/19.  EMS was dispatched on 09/16/19 for 24 hr of progressive DOE, found to have O2 sats in the 80s on room air. On arrival in the ER, he was afebrile, tachypneic and required 4 L [place]. He had AKI with a BNP 2300, HS troponin 80, D-dimer 5.68 - LE swelling R > L. CXR with interstitial densities. He as admitted to [facility] and was treated with lasix and bronchodilators. Unasyn started for suspected aspiration PNA.Marland Kitchen Lower extremity duplex negative for DVT.   PCCM consulted, who recommended CT chest which confirmed idiopathic pulmonary fibrosis. He also has a RLL nodule that will need to be followed with repeat imaging.   Cardiology was consulted for CHF exacerbation. On my interview, he reports LE swelling and abdominal fullness x 2 weeks. He also reports cough and symptoms of dysphagia prior to arrival. His breathing and swelling have improved while  hospitalized. He denies chest pain and does not think he has a current cardiologist. Pt is somewhat difficult to understand at times.    Past Medical History:  Diagnosis Date   Anemia    Coronary artery disease    GERD (gastroesophageal reflux disease)    Hypertension    Pulmonary fibrosis ([place])    Schizo affective schizophrenia ([place])     Past Surgical History:  Procedure Laterality Date   CORONARY ARTERY BYPASS GRAFT       Home Medications:  Prior to Admission medications   Medication Sig Start Date End Date Taking? Authorizing Provider  acetaminophen (TYLENOL) 325 MG tablet Take 2 tablets (650 mg total) by mouth every 6 (six) hours as needed for mild pain (or Fever >/= 101). 05/18/18  Yes Debbe Odea, MD  aspirin 325 MG tablet Take 325 mg by mouth daily.   Yes [provider]  atenolol (TENORMIN) 25 MG tablet Take 25 mg by mouth daily.   Yes [provider]  atorvastatin (LIPITOR) 20 MG tablet Take 20 mg by mouth at bedtime.   Yes [provider]  benztropine (COGENTIN) 0.5 MG tablet Take 0.5 mg by mouth 2 (two) times daily.   Yes [provider]  calcium gluconate 500 MG tablet Take 1 tablet (500 mg total) by mouth 2 (two) times daily. 09/07/19  Yes Patrecia Pour, MD  clonazePAM (KLONOPIN) 0.5 MG tablet Take 0.5 mg by mouth 2 (two) times daily.    Yes [provider]  Dexlansoprazole (DEXILANT) 30 MG capsule  Take 30 mg by mouth daily.   Yes [provider]  Multiple Vitamins-Minerals ([employer] SPECTRAVITE PO) Take 1 tablet by mouth daily.   Yes [provider]  tamsulosin (FLOMAX) 0.4 MG CAPS capsule Take 1 capsule (0.4 mg total) by mouth daily after supper. Patient taking differently: Take 0.4 mg by mouth daily.  05/18/18  Yes Debbe Odea, MD    Inpatient Medications: Scheduled Meds:  atorvastatin  20 mg Oral q1800   benztropine  0.5 mg Oral BID   dexamethasone (DECADRON) injection  4 mg Intravenous Q24H   ferrous  sulfate  325 mg Oral TID WC   fluticasone furoate-vilanterol  1 puff Inhalation Daily   heparin injection (subcutaneous)  5,000 Units Subcutaneous Q8H   insulin aspart  0-5 Units Subcutaneous QHS   insulin aspart  0-9 Units Subcutaneous TID WC   pantoprazole  40 mg Oral Daily   tamsulosin  0.4 mg Oral Daily   umeclidinium bromide  1 puff Inhalation Daily   Continuous Infusions:   PRN Meds: acetaminophen, clonazePAM, guaiFENesin-dextromethorphan, ondansetron (ZOFRAN) IV, Resource ThickenUp Clear  Allergies:   No Known Allergies  Social History:   Social History   Socioeconomic History   Marital status: Unknown    Spouse name: Not on file   Number of children: Not on file   Years of education: Not on file   Highest education level: Not on file  Occupational History   Not on file  Tobacco Use   Smoking status: Former Smoker    Types: Cigarettes   Smokeless tobacco: Never Used  Substance and Sexual Activity   Alcohol use: Not Currently   Drug use: Not Currently   Sexual activity: Not Currently  Other Topics Concern   Not on file  Social History Narrative   Not on file   Social Determinants of Health   Financial Resource Strain:    Difficulty of Paying Living Expenses:   Food Insecurity:    Worried About Charity fundraiser in the Last Year:    Arboriculturist in the Last Year:   Transportation Needs:    Film/video editor (Medical):    Lack of Transportation (Non-Medical):   Physical Activity:    Days of Exercise per Week:    Minutes of Exercise per Session:   Stress:    Feeling of Stress :   Social Connections:    Frequency of Communication with Friends and Family:    Frequency of Social Gatherings with Friends and Family:    Attends Religious Services:    Active Member of Clubs or Organizations:    Attends Music therapist:    Marital Status:   Intimate Partner Violence:    Fear of Current or Ex-Partner:    Emotionally Abused:     Physically Abused:    Sexually Abused:     Family History:    Family History  Problem Relation Age of Onset   Diabetes Mellitus II Mother    Diabetes Mellitus II Maternal Grandmother    Heart disease Maternal Grandmother      ROS:  Please see the history of present illness.   All other ROS reviewed and negative.     Physical Exam/Data:   Vitals:   09/20/19 0340 09/20/19 0344 09/20/19 0727 09/20/19 1043  BP:  (!) 125/55 (!) 151/69 (!) 132/56  Pulse:  (!) 59 (!) 57 61  Resp:  (!) 22 18 (!) 25  Temp:  97.6 F (36.4 C) 97.9  F (36.6 C) (!) 97 F (36.1 C)  TempSrc:  Axillary Oral Axillary  SpO2: (!) 88% 96% 94% 95%  Weight:      Height:        Intake/Output Summary (Last 24 hours) at 09/20/2019 1216 Last data filed at 09/20/2019 0800 Gross per 24 hour  Intake 830 ml  Output 1575 ml  Net -745 ml   Last 3 Weights 09/20/2019 09/19/2019 09/18/2019  Weight (lbs) 154 lb 1.6 oz 150 lb 9.2 oz 157 lb 10.1 oz  Weight (kg) 69.9 kg 68.3 kg 71.5 kg     Body mass index is 23.43 kg/m.  General:  Elderly male, NAD HEENT: normal Neck: no JVD Vascular: No carotid bruits Cardiac:  normal S1, S2; RRR; no murmur  Lungs:  Coarse breath sounds throughout, upper airway congestion  Abd: soft, nontender, no hepatomegaly  Ext: no edema Musculoskeletal:  No deformities, BUE and BLE strength normal and equal Skin: warm and dry  Neuro:  CNs 2-12 intact, no focal abnormalities noted Psych:  Normal affect   EKG:  The EKG was personally reviewed and demonstrates:  Sinus rhythm with HR 72, long QTc of 501 ms Telemetry:  Telemetry was personally reviewed and demonstrates:  Sinus rhythm-bradycardia with HR in the 50-60s  Relevant CV Studies:  Echo 09/06/19: 1. Left ventricular ejection fraction, by estimation, is 60 to 65%. The  left ventricle has normal function. The left ventricle has no regional  wall motion abnormalities. There is mild concentric left ventricular  hypertrophy. Left  ventricular diastolic  parameters are consistent with Grade II diastolic dysfunction  (pseudonormalization). Elevated left atrial pressure.   2. Right ventricular systolic function is normal. The right ventricular  size is normal. Tricuspid regurgitation signal is inadequate for assessing  PA pressure.   3. Left atrial size was moderately dilated.   4. The mitral valve is normal in structure and function. Trivial mitral  valve regurgitation. No evidence of mitral stenosis.   5. The aortic valve is normal in structure and function. Aortic valve  regurgitation is not visualized. Mild aortic valve sclerosis is present,  with no evidence of aortic valve stenosis.   6. The inferior vena cava is dilated in size with >50% respiratory  variability, suggesting right atrial pressure of 8 mmHg.   Laboratory Data:  High Sensitivity Troponin:   Recent Labs  Lab 09/05/19 2156 09/16/19 2244 09/17/19 0121 09/17/19 0449 09/17/19 1351  TROPONINIHS 15 80* 102* 133* 123*     Chemistry Recent Labs  Lab 09/18/19 0115 09/19/19 0240 09/20/19 0238  NA 141 137 136  K 4.2 4.0 4.3  CL 103 98 97*  CO2 23 23 23   GLUCOSE 242* 287* 329*  BUN 52* 68* 73*  CREATININE 3.21* 3.46* 3.29*  CALCIUM 6.8* 7.0* 6.9*  GFRNONAA 18* 16* 17*  GFRAA 20* 19* 20*  ANIONGAP 15 16* 16*    Recent Labs  Lab 09/16/19 2244  PROT 6.4*  ALBUMIN 2.9*  AST 50*  ALT 48*  ALKPHOS 86  BILITOT 0.6   Hematology Recent Labs  Lab 09/18/19 0115 09/18/19 1132 09/19/19 0240 09/20/19 0238  WBC 8.5  --  12.8* 12.5*  RBC 2.65* 2.85* 2.62* 2.72*  HGB 7.8*  --  7.7* 7.9*  HCT 24.2*  --  23.9* 24.7*  MCV 91.3  --  91.2 90.8  MCH 29.4  --  29.4 29.0  MCHC 32.2  --  32.2 32.0  RDW 14.1  --  14.1 14.0  PLT  261  --  293 301   BNP Recent Labs  Lab 09/18/19 0739 09/19/19 0240 09/20/19 0238  BNP 2,505.2* 1,039.5* 1,053.7*    DDimer  Recent Labs  Lab 09/18/19 0739 09/19/19 0240 09/20/19 0238  DDIMER 4.72* 2.97*  2.40*     Radiology/Studies:  DG Chest 2 View  Result Date: 09/19/2019 CLINICAL DATA:  Shortness of breath for several weeks. EXAM: CHEST - 2 VIEW COMPARISON:  Single-view of the chest 09/17/2019. PA and lateral chest 05/15/2018. FINDINGS: The patient is status post CABG. Heart size is upper normal. The lungs appear emphysematous with fibrosis. No consolidative process, pneumothorax or effusion. Atherosclerosis noted. IMPRESSION: No acute disease. The lungs appear emphysematous with fibrosis. Atherosclerosis. Electronically Signed   By: Inge Rise M.D.   On: 09/19/2019 13:40   US RENAL  Result Date: 09/19/2019 CLINICAL DATA:  Acute kidney injury EXAM: RENAL / URINARY TRACT ULTRASOUND COMPLETE COMPARISON:  05/16/2018 FINDINGS: Right Kidney: Renal measurements: 9.5 x 3.9 x 4.2 cm = volume: 82 mL. Mild cortical thinning. Normal cortical echogenicity. Small simple cyst at inferior pole 1.3 x 1.5 x 1.8 cm. Additional small simple cyst at inferior pole 1.4 x 1.2 x 1.5 cm. No solid masses or hydronephrosis. No shadowing calcifications. Left Kidney: Renal measurements: 9.1 x 4.8 x 4.5 cm = volume: 102 mL. Cortical thinning. Upper normal cortical echogenicity. Exophytic simple cyst at inferior pole 3.6 x 3.4 x 3.1 cm. Additional simple exophytic cyst 1.8 x 1.9 x 1.7 cm. No solid mass or hydronephrosis. Bladder: Appears normal for degree of bladder distention. Other: N/A IMPRESSION: Mild age-related renal cortical thinning bilaterally. Small BILATERAL simple appearing renal cysts. No solid renal mass or hydronephrosis identified. Electronically Signed   By: Lavonia Dana M.D.   On: 09/19/2019 15:32   CT Chest High Resolution  Result Date: 09/19/2019 CLINICAL DATA:  Inpatient. Shortness of breath. Abnormal chest radiograph. Evaluate for interstitial lung disease. Former smoker per epic. EXAM: CT CHEST WITHOUT CONTRAST TECHNIQUE: Multidetector CT imaging of the chest was performed following the standard  protocol without intravenous contrast. High resolution imaging of the lungs, as well as inspiratory and expiratory imaging, was performed. COMPARISON:  Chest radiograph from earlier today. FINDINGS: Cardiovascular: Top-normal heart size. No significant pericardial effusion/thickening. Three-vessel coronary atherosclerosis status post CABG. Atherosclerotic nonaneurysmal thoracic aorta. Normal caliber pulmonary arteries. Mediastinum/Nodes: No discrete thyroid nodules. Unremarkable esophagus. No pathologically enlarged axillary, mediastinal or hilar lymph nodes, noting limited sensitivity for the detection of hilar adenopathy on this noncontrast study. Lungs/Pleura: No pneumothorax. No pleural effusion. Subsolid 1.7 cm nodular opacity in right lower lobe (series 6/image 149). No acute consolidative airspace disease, lung masses or additional significant pulmonary nodules. No significant lobular air trapping or evidence of tracheobronchomalacia on the expiration sequence. There is extensive patchy ground-glass opacity and peribronchovascular and subpleural reticulation throughout both lungs with associated mild-to-moderate traction bronchiectasis and architectural distortion. Peripheral and subpleural cystic changes throughout both lungs likely reflect a combination of emphysema and mild honeycombing. Findings are most prominent in the lower lobes. Upper abdomen: No acute abnormality. Musculoskeletal: No aggressive appearing focal osseous lesions. Several discontinuities throughout the sternotomy wires. Nonunion of mid to lower sternotomy. Moderate thoracic spondylosis. IMPRESSION: 1. Spectrum of findings compatible with fibrotic interstitial lung disease with mild-to-moderate traction bronchiectasis and probable mild honeycombing admixed with paraseptal emphysema. Prominent ground-glass component may represent a superimposed process such as pulmonary edema, atypical infection or alveolar hemorrhage. Underlying fibrotic  process probably represents usual interstitial pneumonia (UIP). Findings are categorized  as probable UIP per consensus guidelines: Diagnosis of Idiopathic Pulmonary Fibrosis: An Official ATS/ERS/JRS/ALAT Clinical Practice Guideline. [place] Vol 198, Iss 5, [id number], Mar 05 2017. 2. Subsolid 1.7 cm right lower lobe nodular opacity. Follow-up non-contrast CT recommended at 3-6 months to confirm persistence. If unchanged, and solid component remains <6 mm, annual CT is recommended until 5 years of stability has been established. If persistent these nodules should be considered highly suspicious if the solid component of the nodule is 6 mm or greater in size and enlarging. This recommendation follows the consensus statement: Guidelines for Management of Incidental Pulmonary Nodules Detected on CT Images: From the Fleischner Society 2017; Radiology 2017; 284:228-243. 3. Aortic Atherosclerosis (ICD10-I70.0) and Emphysema (ICD10-J43.9). Electronically Signed   By: Ilona Sorrel M.D.   On: 09/19/2019 20:30   DG Chest Port 1 View  Result Date: 09/17/2019 CLINICAL DATA:  Chest pain and shortness of breath. EXAM: PORTABLE CHEST 1 VIEW COMPARISON:  Radiograph earlier this day. Radiograph yesterday and earlier this month also reviewed. FINDINGS: Post cardiomegaly and CABG. Multiple broken sternal wires again seen. Mild cardiomegaly is stable. Aortic atherosclerosis. Reticular opacities likely represent underlying pulmonary fibrosis, however the degree of interstitial thickening is increased since exam earlier this month. Blunting of the right costophrenic angle may represent developing effusion. No pneumothorax. IMPRESSION: 1. Findings suspicious for pulmonary edema superimposed on pulmonary fibrosis/interstitial lung disease. 2. Blunting of the right costophrenic angle may represent developing effusion. 3. Stable cardiomegaly post CABG. Electronically Signed   By: Keith Rake M.D.   On: 09/17/2019  13:50   DG Chest Port 1 View  Result Date: 09/17/2019 CLINICAL DATA:  Shortness of breath. EXAM: PORTABLE CHEST 1 VIEW COMPARISON:  September 16, 2019. FINDINGS: Stable cardiomediastinal silhouette. Atherosclerosis of thoracic aorta is noted. Sternotomy wires are noted. No pneumothorax is noted. No significant pleural effusion is noted. Bilateral lung opacities are noted which may represent bilateral pulmonary fibrosis or chronic interstitial lung disease, although acute superimposed edema or inflammation cannot be excluded. Bony thorax is unremarkable. IMPRESSION: Aortic atherosclerosis. Bilateral lung opacities are noted which may represent pulmonary fibrosis or chronic interstitial lung disease, although acute superimposed edema or inflammation cannot be excluded. Aortic Atherosclerosis (ICD10-I70.0). Electronically Signed   By: Marijo Conception M.D.   On: 09/17/2019 08:23   DG Chest Port 1 View  Result Date: 09/16/2019 CLINICAL DATA:  79 year old male with shortness of breath. EXAM: PORTABLE CHEST 1 VIEW COMPARISON:  Chest radiograph dated 09/05/2019. FINDINGS: There is diffuse interstitial coarsening and chronic bronchitic changes. There is however overall interval increase in the interstitial densities compared to the prior radiograph which may represent progression of underlying fibrosis or superimposition of pneumonia, possibly viral or atypical. Clinical correlation is recommended. No focal consolidation, pleural effusion, or pneumothorax. Stable cardiomediastinal silhouette. Median sternotomy wires and CABG vascular clips. No acute osseous pathology. IMPRESSION: Interval increase in the interstitial densities compared to the prior radiograph may represent progression of underlying fibrosis or superimposition of pneumonia. Clinical correlation is recommended. Electronically Signed   By: Anner Crete M.D.   On: 09/16/2019 23:24   DG Swallowing Func-Speech Pathology  Result Date:  09/19/2019 Objective Swallowing Evaluation: Type of Study: MBS-Modified Barium Swallow Study  Patient Details Name: Robert Little MRN: [id number] Date of Birth: [birth year] Today's Date: 09/19/2019 Time: SLP Start Time (ACUTE ONLY): 1310 -SLP Stop Time (ACUTE ONLY): 1327 SLP Time Calculation (min) (ACUTE ONLY): 17 min Past Medical History: Past Medical History: Diagnosis Date  Anemia   Coronary artery disease   GERD (gastroesophageal reflux disease)   Hypertension   Pulmonary fibrosis (HCC)   Schizo affective schizophrenia ([place])  Past Surgical History: Past Surgical History: Procedure Laterality Date  CORONARY ARTERY BYPASS GRAFT   HPI: Pt is a 79 y.o. male adm to [facility] with respiratory difficulties. Pt PMH + for GERD, schizoaffective d/o, pulmonary fibrosis, HLD, anemia, AKI, sepsis.  Pt desated to 75% during his lunch meal yesterday requiring increase in oxygen to 15 Liters on NRB.  Today he is on 8 Liters.  He is noted to have broken sternal wires per imaging.  Pt with possible developing effusion, pulmonary edema on fibrosis/ILD.  During the initial evaluation the pt reported dysphagia to liquids more than foods causing him to cough. He also states pills will lodge in his throat - pointing to distal region/ ? pyriform sinus.  He denied expectorating food or liquid and stated he can "get it down".   Per recent admit note, group home staff reports pt has periods of time when his intake is poor but pt denies specific reasoning for this issue. Chest x-ray 3/17: Pt is a 79 y.o. male adm to [facility] with respiratory difficulties. Pt PMH + for GERD, schizoaffective d/o, pulmonary fibrosis, HLD, anemia, AKI, sepsis.  Pt desated to 75% during his lunch meal yesterday requiring increase in oxygen to 15 Liters on NRB.  Today he is on 8 Liters.  He is noted to have broken sternal wires per imaging.  Pt with possible developing effusion, pulmonary edema on fibrosis/ILD.  During the initial evaluation the pt reported dysphagia to liquids  more than foods causing him to cough. He also states pills will lodge in his throat - pointing to distal region/ ? pyriform sinus.  He denied expectorating food or liquid and stated he can "get it down".   Per recent admit note, group home staff reports pt has periods of time when his intake is poor but pt denies specific reasoning for this issue. Chest x-ray 3/17 was negative for acute changes.  Subjective: pt awake in bed Assessment / Plan / Recommendation CHL IP CLINICAL IMPRESSIONS 09/19/2019 Clinical Impression Pt presents with mild pharyngeal dysphagia characterized by a pharyngeal delay which facilitated penetration (PAS 3) with thin liquids and with consecutive swallows of nectar thick liquids via straw. Penetration was improved to a PAS level 2 with individual sips of nectar thick liquids. No instances of aspiration were noted but pt is at risk for aspiration with thin liquids and with use of consecutive swallows. Considering the appreciated aspiration risk combined with his reports, it is recommended that the current diet of dysphagia 3 solids and nectar thick liquids via cup be continued at this time. SLP will follow for dysphagia treatment. SLP Visit Diagnosis Dysphagia, pharyngeal phase (R13.13) Attention and concentration deficit following -- Frontal lobe and executive function deficit following -- Impact on safety and function Mild aspiration risk   CHL IP TREATMENT RECOMMENDATION 09/19/2019 Treatment Recommendations Therapy as outlined in treatment plan below   Prognosis 09/19/2019 Prognosis for Safe Diet Advancement Good Barriers to Reach Goals Time post onset Barriers/Prognosis Comment -- CHL IP DIET RECOMMENDATION 09/19/2019 SLP Diet Recommendations Dysphagia 2 (Fine chop) solids;Nectar thick liquid Liquid Administration via Cup;No straw Medication Administration Whole meds with puree Compensations Slow rate;Small sips/bites Postural Changes Seated upright at 90 degrees   CHL IP OTHER RECOMMENDATIONS  09/19/2019 Recommended Consults -- Oral Care Recommendations Oral care BID Other Recommendations Order thickener from pharmacy  CHL IP FOLLOW UP RECOMMENDATIONS 09/19/2019 Follow up Recommendations Home health SLP   CHL IP FREQUENCY AND DURATION 09/19/2019 Speech Therapy Frequency (ACUTE ONLY) min 2x/week Treatment Duration 2 weeks      CHL IP ORAL PHASE 09/19/2019 Oral Phase WFL Oral - Pudding Teaspoon -- Oral - Pudding Cup -- Oral - Honey Teaspoon -- Oral - Honey Cup -- Oral - Nectar Teaspoon -- Oral - Nectar Cup -- Oral - Nectar Straw -- Oral - Thin Teaspoon -- Oral - Thin Cup -- Oral - Thin Straw -- Oral - Puree -- Oral - Mech Soft -- Oral - Regular -- Oral - Multi-Consistency -- Oral - Pill -- Oral Phase - Comment --  CHL IP PHARYNGEAL PHASE 09/19/2019 Pharyngeal Phase Impaired Pharyngeal- Pudding Teaspoon -- Pharyngeal -- Pharyngeal- Pudding Cup -- Pharyngeal -- Pharyngeal- Honey Teaspoon -- Pharyngeal -- Pharyngeal- Honey Cup -- Pharyngeal -- Pharyngeal- Nectar Teaspoon -- Pharyngeal -- Pharyngeal- Nectar Cup -- Pharyngeal -- Pharyngeal- Nectar Straw -- Pharyngeal -- Pharyngeal- Thin Teaspoon -- Pharyngeal -- Pharyngeal- Thin Cup -- Pharyngeal -- Pharyngeal- Thin Straw -- Pharyngeal -- Pharyngeal- Puree -- Pharyngeal -- Pharyngeal- Mechanical Soft -- Pharyngeal -- Pharyngeal- Regular -- Pharyngeal -- Pharyngeal- Multi-consistency -- Pharyngeal -- Pharyngeal- Pill -- Pharyngeal -- Pharyngeal Comment --  Shanika I. Hardin Negus, [place], [place] Office number [phone] Pager [phone] Horton Marshall 09/19/2019, 3:05 PM              VAS [place] LOWER EXTREMITY VENOUS (DVT) (ONLY [facility] & [facility])  Result Date: 09/17/2019  Lower Venous DVTStudy Indications: Edema.  Limitations: Poor ultrasound/tissue interface and body habitus. Comparison Study: No prior study Performing Technologist: Maudry Mayhew MHA, RDMS, RVT, RDCS  Examination Guidelines: A complete evaluation includes B-mode imaging,  spectral Doppler, color Doppler, and power Doppler as needed of all accessible portions of each vessel. Bilateral testing is considered an integral part of a complete examination. Limited examinations for reoccurring indications may be performed as noted. The reflux portion of the exam is performed with the patient in reverse Trendelenburg.  +---------+---------------+---------+-----------+----------+--------------+ RIGHT    CompressibilityPhasicitySpontaneityPropertiesThrombus Aging +---------+---------------+---------+-----------+----------+--------------+ CFV      Full           No       Yes                                 +---------+---------------+---------+-----------+----------+--------------+ SFJ      Full                                                        +---------+---------------+---------+-----------+----------+--------------+ FV Prox  Full                                                        +---------+---------------+---------+-----------+----------+--------------+ FV Mid   Full                                                        +---------+---------------+---------+-----------+----------+--------------+  FV DistalFull                                                        +---------+---------------+---------+-----------+----------+--------------+ PFV      Full                                                        +---------+---------------+---------+-----------+----------+--------------+ POP      Full           No       Yes                                 +---------+---------------+---------+-----------+----------+--------------+ PTV      Full                                                        +---------+---------------+---------+-----------+----------+--------------+ PERO     Full                                                        +---------+---------------+---------+-----------+----------+--------------+    +---------+---------------+---------+-----------+----------+--------------+ LEFT     CompressibilityPhasicitySpontaneityPropertiesThrombus Aging +---------+---------------+---------+-----------+----------+--------------+ CFV      Full           No       Yes                                 +---------+---------------+---------+-----------+----------+--------------+ SFJ      Full                                                        +---------+---------------+---------+-----------+----------+--------------+ FV Prox  Full                                                        +---------+---------------+---------+-----------+----------+--------------+ FV Mid   Full                                                        +---------+---------------+---------+-----------+----------+--------------+ FV DistalFull                                                        +---------+---------------+---------+-----------+----------+--------------+  PFV      Full                                                        +---------+---------------+---------+-----------+----------+--------------+ POP      Full           No       Yes                                 +---------+---------------+---------+-----------+----------+--------------+ PTV      Full                                                        +---------+---------------+---------+-----------+----------+--------------+ PERO     Full                                                        +---------+---------------+---------+-----------+----------+--------------+     Summary: RIGHT: - There is no evidence of deep vein thrombosis in the lower extremity.  - No cystic structure found in the popliteal fossa.  LEFT: - There is no evidence of deep vein thrombosis in the lower extremity.  - No cystic structure found in the popliteal fossa.  Bilateral lower extremity venous flow is pulsatile, suggestive of possible  elevated right heart pressure. *See table(s) above for measurements and observations. Electronically signed by Ruta Hinds MD on 09/17/2019 at 7:07:29 PM.    Final        TIMI Risk Score for Unstable Angina or Non-ST Elevation MI:   The patient's TIMI risk score is 5, which indicates a 26% risk of all cause mortality, new or recurrent myocardial infarction or need for urgent revascularization in the next 14 days.   Assessment and Plan:   1. Dyspnea 2. Acute on chronic diastolic heart failure 3. IPF 4. Aspiration PNA, dysphagia 5. Former smoker, emphysema - BNP elevated on arrival in the setting of sCr 3.11 (2.6 10 days prior), now trending down to 1000 - started on lasix 40 mg IV initially - requiring supplemental O2 at 6L - has diuresed 1.5 L urine output and is now overall net negative 5.7 L - weight is 154 lbs, from 157 lbs - started on dexamethasone x 1 week, which can lead to more fluid retention - CXR with emphysema  - BUN 73 - complex situation given the above and his respiratory status - I do not appreciate edema or JVD on exam, lung exam difficult - suspect he is near diuresis goal - he has not received lasix since 3/16 (received 5 doses of IV lasix) - recommend holding further diuretic for now - CXR is improved - starting tomorrow, would add lasix 40 mg daily as a maintenance dose   6. CAD s/p CABG - HS troponin 80 --> 102 - EKG nonischemic - pt denies chest pain - suspect demand ischemia in the setting of hypoxic respiratory failure - unclear why he is on 325 mg  ASA - would opt to reduce that to 81 mg - home regimen includes atenolol - continue statin - no plans for ischemic workup - poor candidate for invasive procedures    7. Acute on chronic renal insufficiency - sCr peaked at 3.46, was 2.6 09/06/19 and 1.77 05/2018 - this will complicate diuresis - may need nephrology input - renal ultrasound without acute findings   8. Hypotension - seems to have  resolved.   9. Anemia of chronic disease - Hb 7.9, no signs of bleeding - decrease ASA to 81 mg     For questions or updates, please contact [place] Please consult [email or web address] for contact info under     Signed, Ledora Bottcher, PA  09/20/2019 12:16 PM  I have seen and examined the patient along with Ledora Bottcher, PA .  I have reviewed the chart, notes and new data.  I agree with PA/NP's note.  Key new complaints: breathing is better, never had angina Key examination changes: JVP normal, no edema, some dry crackles in bilateral posterior lungs, no rales. Poor dentition. Normal cardiac exam Key new findings / data: reviewed echo from 2 weeks ago - normal LVEF, dilated LA and pseudonormal mitral inflow, c/w acute left heart failure. Severe infiltrates on admission CXR have resolved in <72h, with diuresis.  PLAN: While it is possible that he may have idiopathic pulmonary fibrosis and aspiration pneumonia, there is evidence of CHF as the major component of his hypoxia and lung infiltrates, both showing rapid improvement after diuresis. He is probably euvolemic now. Preserved LVEF, history of previous CABG. Not a good candidate for percutaneous revascularization and redo CABG. Evaluation for ischemia will not change management (also free of angina and normal LVEF). Start on low dose of daily diuretic. Need to implement a low sodium diet for him in his group home. Mildly long QT due to psychotropic meds.  Sanda Klein, MD, [place] [phone] 09/20/2019, 1:03 PM

## 2019-09-20 NOTE — Progress Notes (Signed)
SATURATION QUALIFICATIONS: (This note is used to comply with regulatory documentation for home oxygen)  Patient Saturations on Room Air at Rest = 88%  Patient Saturations on Room Air while Ambulating = 82%  Patient Saturations on 2 Liters of oxygen while Ambulating = 77%  Please briefly explain why patient needs home oxygen: desats with ambulation.

## 2019-09-20 NOTE — TOC Initial Note (Addendum)
Transition of Care [facility]) - Initial/Assessment Note    Patient Details  Name: Robert Little MRN: [id number] Date of Birth: 29-Jun-1941  Transition of Care [facility]) CM/SW Contact:    Zenon Mayo, RN Phone Number: 09/20/2019, 2:02 PM  Clinical Narrative:                 [facility] [name] at home phone listed well be his transportation at dc.  Patient will need HHPT, NCM offered choice, he states he does not have a preference.  NCM made referral to [facility]. They can't take referral due to staffing,.  NCM made referral to [place] with [facility], awaiting call back. [facility] can not take referral.  NCM made referral to [facility] with [facility].  Awaiting call back  He will also need home oxygen per Staff RN,  Patient states it is ok for [facility] to supply this for him, referral given to [facility] with [facility].    3/19Hoyle Sauer with [facility] states they can take referral fro HHPT.  Soc will begin 24 to 48 hrs post dc.   Expected Discharge Plan: [place] Barriers to Discharge: Continued Medical Work up   Patient Goals and CMS Choice Patient states their goals for this hospitalization and ongoing recovery are:: get better CMS Medicare.gov Compare Post Acute Care list provided to:: Patient Choice offered to / list presented to : Patient  Expected Discharge Plan and Services Expected Discharge Plan: [place]   Discharge Planning Services: CM Consult Post Acute Care Choice: [place] arrangements for the past 2 months: Group Home([facility])                 DME Arranged: Oxygen DME Agency: [facility] Date DME Agency Contacted: 09/20/19 Time DME Agency Contacted: 4 Representative spoke with at DME Agency: [place]: PT [place]: [place] Date [place]: 09/20/19 Time [place]: 64 Representative spoke with at [place]: cory  Prior Living  Arrangements/Services Living arrangements for the past 2 months: Group Home([facility]) Lives with:: Other (Comment)(group home) Patient language and need for interpreter reviewed:: Yes Do you feel safe going back to the place where you live?: Yes      Need for Family Participation in Patient Care: Yes (Comment) Care giver support system in place?: Yes (comment)   Criminal Activity/Legal Involvement Pertinent to Current Situation/Hospitalization: No - Comment as needed  Activities of Daily Living   ADL Screening (condition at time of admission) Patient's cognitive ability adequate to safely complete daily activities?: Yes  Permission Sought/Granted                  Emotional Assessment Appearance:: Appears stated age Attitude/Demeanor/Rapport: Engaged Affect (typically observed): Appropriate Orientation: : Oriented to Self, Oriented to Place, Oriented to  Time, Oriented to Situation Alcohol / Substance Use: Not Applicable Psych Involvement: No (comment)  Admission diagnosis:  CHF (congestive heart failure) (HCC) [I50.9] Peripheral edema [R60.9] SOB (shortness of breath) [R06.02] Hypoxia [R09.02] Acute respiratory failure with hypoxia ([place]) [J96.01] Acute hypoxemic respiratory failure ([place]) [J96.01] Patient Active Problem List   Diagnosis Date Noted  . Acute respiratory failure with hypoxia ([place]) 09/17/2019  . Positive D dimer 09/17/2019  . Acute on chronic diastolic CHF (congestive heart failure) ([place]) 09/17/2019  . CHF (congestive heart failure) ([place]) 09/17/2019  . Acute hypoxemic respiratory failure ([place]) 09/17/2019  . Syncope 09/05/2019  . Hypocalcemia  09/05/2019  . Hypomagnesemia 09/05/2019  . Acute urinary retention 05/18/2018  . UTI (urinary tract infection) 05/15/2018  . Sepsis ([place]) 05/15/2018  . Hypokalemia 05/15/2018  . Acute renal failure superimposed on stage 4 chronic kidney disease ([place]) 05/15/2018  . HLD (hyperlipidemia) 05/15/2018  . Coronary  artery disease   . Hypertension   . Pulmonary fibrosis ([place])   . Schizo affective schizophrenia ([place])   . Normocytic anemia    PCP:  Patient, No Pcp Per Pharmacy:   [id number] [address] [place] [place] [address] Phone: [phone] Fax: [phone]     Social Determinants of Health (SDOH) Interventions    Readmission Risk Interventions Readmission Risk Prevention Plan 09/20/2019  Transportation Screening Complete  PCP or Specialist Appt within 3-5 Days Complete  HRI or [place] Complete  Social Work Consult for [place] Planning/Counseling Complete  Palliative Care Screening Not Applicable  Medication Review Press photographer) Complete

## 2019-09-20 NOTE — Progress Notes (Addendum)
Initial Nutrition Assessment  DOCUMENTATION CODES:   Not applicable  INTERVENTION:    Magic cup TID with meals, each supplement provides 290 kcal and 9 grams of protein  30 ml Prostat BID, each supplement provides 100 kcals and 15 grams protein.   MVI daily   NUTRITION DIAGNOSIS:   Increased nutrient needs related to acute illness as evidenced by estimated needs.  GOAL:   Patient will meet greater than or equal to 90% of their needs  MONITOR:   PO intake, Supplement acceptance, Diet advancement, Weight trends, Labs, I & O's  REASON FOR ASSESSMENT:   Consult Assessment of nutrition requirement/status  ASSESSMENT:   Patient with PMH significant for CAD, CHF, pulmonary fibrosis, HTN, and schizoaffective disorder. Presents this admission with CHF exacerbation and aspiration PNA.   RD working remotely.  Unable to reach pt by phone. Diet downgraded to DYS 3 with nectar thick liquids on 3/16. Last two meal completions charted as 100%. RD to provide supplementation to maximize kcal and protein this admission.   Records lack weight information over the last year. Shows to have weighed 71.8 kg in November 2019 and 68.3 kg this admission. Will attempt to obtain nutrition/weight history if possible.   I/O: -5,513 ml since admit UOP: 1,575 ml x 24 hrs   Medications: decadron, ferrous sulfate, SS novolog  Labs: Cr 3.2-trending down CBG 287-315  Diet Order:   Diet Order            DIET DYS 3 Room service appropriate? Yes with Assist; Fluid consistency: Nectar Thick; Fluid restriction: 1500 mL Fluid  Diet effective now              EDUCATION NEEDS:   Not appropriate for education at this time  Skin:  Skin Assessment: Reviewed RN Assessment  Last BM:  3/18  Height:   Ht Readings from Last 1 Encounters:  09/17/19 5\' 8"  (1.727 m)    Weight:   Wt Readings from Last 1 Encounters:  09/20/19 69.9 kg    BMI:  Body mass index is 23.43 kg/m.  Estimated  Nutritional Needs:   Kcal:  2000-2200 kcal  Protein:  100-120 grams  Fluid:  1.5L fluid restriction   Mariana Single RD, LDN Clinical Nutrition Pager listed in [place]

## 2019-09-20 NOTE — Progress Notes (Signed)
Patient sats at 90-92% on 2L O2 while at rest. Patient's O2 saturation at rest on room air is 88-89%.  When patient ambulates on room air, he desats to 81%,. Placed on 2 L saturation 82-88%. During ambulation, Pt desated. To 77%, increased O2 to 4 L..  Pt ambulated a total of approximately 50 feet Pt returned to room, left on 4 L, for 5 minutes for recovery, Pt observed to have SOB during recovery time.

## 2019-09-20 NOTE — Progress Notes (Signed)
NAME:  Robert Little, MRN:  [id number], DOB:  1941/02/05, LOS: 3 ADMISSION DATE:  09/16/2019, CONSULTATION DATE:  09/19/19 REFERRING MD:  Dr. Cyndia Skeeters / [facility], CHIEF COMPLAINT:  Dyspnea, Hypoxia   Brief History   79 y/o M, group home resident, admitted 3/14 with complaints of SOB.    History of present illness   79 y/o M, group home resident, who presented to [facility] on 3/14 with reports of shortness of breath.   The patient reported progressive SOB over the 24 hour period prior to EMS call. Upon EMS evaluation, the patient was noted to have saturations in the 80's on RA. On arrival to ER, he was afebrile, requiring 4L O2 to maintain saturations in the low 90's, tachypneic with normal blood pressure.  EKG unremarkable.  Labs noted AKI, troponin 80, BNP 2300, D-Dimer 5.68, and normocytic anemia. COVID antigen negative. CXR raised concern for interstitial densities.  He was admitted per [facility] for further evaluation.  He was treated with lasix & bronchodilators.  LE dopplers assessed and negative. VQ scan ordered and pending. There were concerns for aspiration.  Swallow evaluation pending.  He was treated for suspected aspiration with unasyn.    PCCM consulted for evaluation of hypoxemic respiratory failure.    The patient reports he began smoking at age 44, quit age 71.  Smoked up to 4-5 packs per day at his heaviest.  He quit due to expense of tobacco.  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   Past Medical History  GERD  HTN CAD Chronic dCHF Schizophrenia  Anemia  Pulmonary fibrosis - not defined, no CT imaging in Epic, no [facility] Events   3/14 Admit  3/17 PCCM consulted   Consults:  PCCM  Cardiology   Procedures:    Significant Diagnostic Tests:   TTE 3/4 >> LVEF ~ 60-65%, normal LV function, no RWMA, grade II diastolic dysfunction, elevated LA pressure, RV systolic pressure normal, LA moderately dilated, trivial MVR, mild aortic  valve sclerosis without stenosis  BLE Venous Doppler 3/15 >> negative for DVT  Urine Na 93, Cr 53.70 / FENa >3  MBS 3/17 >>   Micro Data:  UA 3/15 >> rare bacteria, WBC 0-5 COVID 3/15 >> negative   Antimicrobials:  Unasyn 3/16 >>   Interim history/subjective:  Doing well. Down to 2L during day, 3 at night.  Objective   Blood pressure (!) 132/56, pulse 61, temperature (!) 97 F (36.1 C), temperature source Axillary, resp. rate (!) 25, height 5\' 8"  (1.727 m), weight 69.9 kg, SpO2 95 %.        Intake/Output Summary (Last 24 hours) at 09/20/2019 1104 Last data filed at 09/20/2019 0800 Gross per 24 hour  Intake 830 ml  Output 1575 ml  Net -745 ml   Filed Weights   09/18/19 0300 09/19/19 0650 09/20/19 0307  Weight: 71.5 kg 68.3 kg 69.9 kg    Examination: GEN: no acute distress HEENT: poor dentition, MMM CV: RRR, ext warm PULM: scattered dry crackles, no accessory muscle use GI: Soft, +BS EXT: No edema NEURO: moves all 4 ext PSYCH: RASS 0 SKIN: No rashes   Resolved Hospital Problem list      Assessment & Plan:  Acute hypoxemic respiratory failure due to fibrotic NSIP vs. UIP and superimposed volume overload, latter of which has resolved.  Some penetration on thin liquids with MBSS recommended for dysphagia 2 with nectar thick.  On dexamethasone presently.  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    - Continue 2L during day, 3L at night, wean as able to maintain sats > 88% - Does not need VTE workup, his CT scan clearly explains his degree of hypoxemia - Start anoro and breo while in hospital, trelegy on discharge - Would stop dexamethasone now or after 7 days, per primary's discretion - Will arrange for f/u with one of our ILD specialists; rheum workup will be distorted by steroids so can order as OP - Will sign off, call if further questions or concerns.  Erskine Emery MD PCCM

## 2019-09-21 ENCOUNTER — Emergency Department (HOSPITAL_COMMUNITY)
Admission: EM | Admit: 2019-09-21 | Discharge: 2019-09-22 | Disposition: A | Discharge status: Home / Self Care | Payer: Medicare Other | Attending: Emergency Medicine | Admitting: Emergency Medicine

## 2019-09-21 ENCOUNTER — Other Ambulatory Visit: Payer: Self-pay

## 2019-09-21 DIAGNOSIS — Z951 Presence of aortocoronary bypass graft: Secondary | ICD-10-CM | POA: Insufficient documentation

## 2019-09-21 DIAGNOSIS — I10 Essential (primary) hypertension: Secondary | ICD-10-CM

## 2019-09-21 DIAGNOSIS — Z79899 Other long term (current) drug therapy: Secondary | ICD-10-CM | POA: Insufficient documentation

## 2019-09-21 DIAGNOSIS — D649 Anemia, unspecified: Secondary | ICD-10-CM

## 2019-09-21 DIAGNOSIS — I2510 Atherosclerotic heart disease of native coronary artery without angina pectoris: Secondary | ICD-10-CM | POA: Insufficient documentation

## 2019-09-21 DIAGNOSIS — J9611 Chronic respiratory failure with hypoxia: Secondary | ICD-10-CM | POA: Diagnosis not present

## 2019-09-21 DIAGNOSIS — Z87891 Personal history of nicotine dependence: Secondary | ICD-10-CM | POA: Insufficient documentation

## 2019-09-21 DIAGNOSIS — I130 Hypertensive heart and chronic kidney disease with heart failure and stage 1 through stage 4 chronic kidney disease, or unspecified chronic kidney disease: Secondary | ICD-10-CM | POA: Insufficient documentation

## 2019-09-21 DIAGNOSIS — F259 Schizoaffective disorder, unspecified: Secondary | ICD-10-CM

## 2019-09-21 DIAGNOSIS — N184 Chronic kidney disease, stage 4 (severe): Secondary | ICD-10-CM | POA: Insufficient documentation

## 2019-09-21 DIAGNOSIS — E1165 Type 2 diabetes mellitus with hyperglycemia: Secondary | ICD-10-CM

## 2019-09-21 DIAGNOSIS — R5381 Other malaise: Secondary | ICD-10-CM

## 2019-09-21 DIAGNOSIS — Z9981 Dependence on supplemental oxygen: Secondary | ICD-10-CM | POA: Insufficient documentation

## 2019-09-21 DIAGNOSIS — Z7902 Long term (current) use of antithrombotics/antiplatelets: Secondary | ICD-10-CM | POA: Insufficient documentation

## 2019-09-21 DIAGNOSIS — I5032 Chronic diastolic (congestive) heart failure: Secondary | ICD-10-CM | POA: Insufficient documentation

## 2019-09-21 LAB — CBC
HCT: 27.3 % — ABNORMAL LOW (ref 39.0–52.0)
Hemoglobin: 8.6 g/dL — ABNORMAL LOW (ref 13.0–17.0)
MCH: 29 pg (ref 26.0–34.0)
MCHC: 31.5 g/dL (ref 30.0–36.0)
MCV: 91.9 fL (ref 80.0–100.0)
Platelets: 333 10*3/uL (ref 150–400)
RBC: 2.97 MIL/uL — ABNORMAL LOW (ref 4.22–5.81)
RDW: 14.1 % (ref 11.5–15.5)
WBC: 12 10*3/uL — ABNORMAL HIGH (ref 4.0–10.5)
nRBC: 0 % (ref 0.0–0.2)

## 2019-09-21 LAB — BASIC METABOLIC PANEL
Anion gap: 13 (ref 5–15)
BUN: 68 mg/dL — ABNORMAL HIGH (ref 8–23)
CO2: 25 mmol/L (ref 22–32)
Calcium: 7.7 mg/dL — ABNORMAL LOW (ref 8.9–10.3)
Chloride: 100 mmol/L (ref 98–111)
Creatinine, Ser: 2.85 mg/dL — ABNORMAL HIGH (ref 0.61–1.24)
GFR calc Af Amer: 24 mL/min — ABNORMAL LOW (ref >60)
GFR calc non Af Amer: 20 mL/min — ABNORMAL LOW (ref >60)
Glucose, Bld: 188 mg/dL — ABNORMAL HIGH (ref 70–99)
Potassium: 3.4 mmol/L — ABNORMAL LOW (ref 3.5–5.1)
Sodium: 138 mmol/L (ref 135–145)

## 2019-09-21 LAB — GLUCOSE, CAPILLARY
Glucose-Capillary: 172 mg/dL — ABNORMAL HIGH (ref 70–99)
Glucose-Capillary: 243 mg/dL — ABNORMAL HIGH (ref 70–99)

## 2019-09-21 LAB — LIPID PANEL
Cholesterol: 85 mg/dL (ref 0–200)
HDL: 33 mg/dL — ABNORMAL LOW (ref >40)
LDL Cholesterol: 41 mg/dL (ref 0–99)
Total CHOL/HDL Ratio: 2.6 RATIO
Triglycerides: 56 mg/dL (ref <150)
VLDL: 11 mg/dL (ref 0–40)

## 2019-09-21 LAB — MAGNESIUM: Magnesium: 1.6 mg/dL — ABNORMAL LOW (ref 1.7–2.4)

## 2019-09-21 MED ORDER — ATENOLOL 25 MG PO TABS
25.0000 mg | ORAL_TABLET | Freq: Every day | ORAL | Status: DC
  Administered 2019-09-21: 11:00:00 25 mg via ORAL
  Filled 2019-09-21: qty 1

## 2019-09-21 MED ORDER — ASPIRIN EC 81 MG PO TBEC
81.0000 mg | DELAYED_RELEASE_TABLET | Freq: Every day | ORAL | Status: DC
  Administered 2019-09-21: 11:00:00 81 mg via ORAL
  Filled 2019-09-21: qty 1

## 2019-09-21 MED ORDER — POTASSIUM CHLORIDE CRYS ER 20 MEQ PO TBCR
40.0000 meq | EXTENDED_RELEASE_TABLET | Freq: Once | ORAL | Status: AC
  Administered 2019-09-21: 40 meq via ORAL
  Filled 2019-09-21: qty 2

## 2019-09-21 MED ORDER — GLIPIZIDE 5 MG PO TABS: 5.0000 mg | ORAL_TABLET | Freq: Two times a day (BID) | ORAL | 1 refills | Status: DC

## 2019-09-21 MED ORDER — FUROSEMIDE 40 MG PO TABS
40.0000 mg | ORAL_TABLET | Freq: Every day | ORAL | Status: DC
  Administered 2019-09-21: 11:00:00 40 mg via ORAL
  Filled 2019-09-21: qty 1

## 2019-09-21 MED ORDER — MAGNESIUM SULFATE 2 GM/50ML IV SOLN
2.0000 g | Freq: Once | INTRAVENOUS | Status: AC
  Administered 2019-09-21: 05:00:00 2 g via INTRAVENOUS
  Filled 2019-09-21: qty 50

## 2019-09-21 MED ORDER — POLYETHYLENE GLYCOL 3350 17 GM/SCOOP PO POWD: 17.0000 g | Freq: Two times a day (BID) | ORAL | 0 refills | Status: AC | PRN

## 2019-09-21 MED ORDER — FLUTICASONE FUROATE-VILANTEROL 200-25 MCG/INH IN AEPB: 1.0000 | INHALATION_SPRAY | Freq: Every day | RESPIRATORY_TRACT | 1 refills | Status: AC

## 2019-09-21 MED ORDER — POTASSIUM CHLORIDE CRYS ER 20 MEQ PO TBCR: 20.0000 meq | EXTENDED_RELEASE_TABLET | Freq: Every day | ORAL | 1 refills | Status: DC

## 2019-09-21 MED ORDER — FERROUS SULFATE 325 (65 FE) MG PO TABS: 325.0000 mg | ORAL_TABLET | Freq: Two times a day (BID) | ORAL | 1 refills | Status: AC

## 2019-09-21 MED ORDER — FUROSEMIDE 40 MG PO TABS: 40.0000 mg | ORAL_TABLET | Freq: Every day | ORAL | 1 refills | Status: DC

## 2019-09-21 MED ORDER — UMECLIDINIUM BROMIDE 62.5 MCG/INH IN AEPB: 1.0000 | INHALATION_SPRAY | Freq: Every day | RESPIRATORY_TRACT | 1 refills | Status: AC

## 2019-09-21 MED ORDER — ALBUTEROL SULFATE HFA 108 (90 BASE) MCG/ACT IN AERS: 2.0000 | INHALATION_SPRAY | Freq: Four times a day (QID) | RESPIRATORY_TRACT | 1 refills | Status: AC | PRN

## 2019-09-21 MED ORDER — POTASSIUM CHLORIDE CRYS ER 20 MEQ PO TBCR
40.0000 meq | EXTENDED_RELEASE_TABLET | Freq: Every day | ORAL | Status: DC
  Administered 2019-09-21: 40 meq via ORAL
  Filled 2019-09-21: qty 2

## 2019-09-21 MED ORDER — SENNOSIDES-DOCUSATE SODIUM 8.6-50 MG PO TABS: 1.0000 | ORAL_TABLET | Freq: Two times a day (BID) | ORAL | 11 refills | Status: DC | PRN

## 2019-09-21 MED ORDER — ASPIRIN 81 MG PO TBEC: 81.0000 mg | DELAYED_RELEASE_TABLET | Freq: Every day | ORAL | 1 refills | Status: DC

## 2019-09-21 MED FILL — FERROUS SULFATE 325 MG TAB: 325 (65 FE) | 50 days supply | Qty: 100 | Fill #0 | Status: TO

## 2019-09-21 MED FILL — SENEXON-S 8.6-50 MG TABS: 8.6-50 | 30 days supply | Qty: 60 | Fill #0 | Status: TO

## 2019-09-21 MED FILL — glipiZIDE 5 MG TABS: 5 | 30 days supply | Qty: 60 | Fill #0 | Status: TO

## 2019-09-21 MED FILL — BREO ELLIPTA 200-25 MCG INH: 200-25 | 30 days supply | Qty: 60 | Fill #0 | Status: TO

## 2019-09-21 MED FILL — POTASSIUM CHLORIDE 20meqER: 20 | 90 days supply | Qty: 90 | Fill #0 | Status: TO

## 2019-09-21 MED FILL — ALBUTEROL SULFATE HFA 108 (: 108 (90 BAS | 25 days supply | Qty: 18 | Fill #0 | Status: TO

## 2019-09-21 MED FILL — INCRUSE ELLIPTA 62.5 MCG IN: 62.5 | 30 days supply | Qty: 30 | Fill #0 | Status: TO

## 2019-09-21 MED FILL — FUROSEMIDE 40 MG TABLET: 40 | 90 days supply | Qty: 90 | Fill #0 | Status: TO

## 2019-09-21 MED FILL — POLYETHYLENE GLYCOL 3350 PO: 17 | 7 days supply | Qty: 238 | Fill #0 | Status: TO

## 2019-09-21 NOTE — Discharge Summary (Signed)
Physician Discharge Summary  Robert Little YWV:[id number] DOB: 10/31/1940 DOA: 09/16/2019  PCP: Patient, No Pcp Per  Admit date: 09/16/2019 Discharge date: 09/21/2019   Admitted From: ALF/Group home Disposition:  ALF/Group home  Recommendations for Outpatient Follow-up:  1. Follow ups as below. 2. Please obtain CBC/BMP/Mag at follow up 3. Please follow up on the following pending results: none  Home Health: PT/OT Equipment/Devices: Home oxygen  Discharge Condition: Stable CODE STATUS: DNR/DNI  Follow-up Information    Brand Males, MD Follow up in 4 week(s).   Specialty: Pulmonary Disease Why: We will schedule you for appt, call if you do not hear from [place] by end of month. Contact information: [place] [address] [phone]        [facility] Follow up.   Why: home oxygen Contact information: [place] [address] [phone]        [facility] Follow up.   Why: HHPT Contact information: [place] [address] [phone]        Lonn Georgia, PA-C Follow up on 10/10/2019.   Specialties: Cardiology, Radiology Why: 9:45 am Contact information: [address] [place] [address] [phone]          Hospital Course: 79 year old male with history of CAD, diastolic CHF, pulmonary fibrosis, HTN, chronic anemia, schizoaffective disorder presenting with progressive shortness of breath and orthopnea for 2 days, and admitted for acute hypoxic respiratory failure due to diastolic CHF exacerbation, and possible aspiration pneumonia.  Started on IV Lasix, IV Unasyn and dexamethasone.   PCCM and cardiology consulted on 3/17.  High-resolution CT chest concerning for idiopathic pulmonary fibrosis.  He was a started on LAMA/LABA/ICS.  Pulmonology to arrange outpatient follow-up for further work-up on pulmonary fibrosis.  In regards to his diastolic CHF, he was diuresed well with  IV Lasix which was briefly on hold due to AKI.  AKI improved.  Restarted on oral Lasix.  And will be discharged on oral Lasix 40 mg daily and potassium supplementation.  We recommend daily weight, sodium restriction to less than 2 g a day and fluid restriction to less than 1500 cc (6 cups) a day.  Please obtain weight on return to facility to use as a reference.  Please notify his cardiologist if 3 pounds or more weight gain in 24 hours.  Please check renal function and magnesium in 1 week.  On the day of discharge, patient was ambulated on room air and desaturated to 70% requiring 2 L to recover to lower 90s.  Patient will be on 2 L by nasal cannula during the day and 3 L at night.  Patient was evaluated by PT/OT who recommended home health PT/OT.  No DME requirement other than home oxygen.  See individual problem list below for more hospital course.  Discharge Diagnoses:  Acute respiratory failure with hypoxia due to diastolic CHF exacerbation,  aspiration pneumonia, pulmonary fibrosis and anemia.  -Discharged on 2 L during the day and 3 L at night. -Encourage incentive spirometry -CHF, COPD and fibrosis as below.  Acute on chronic diastolic CHF: Echo on 1/0/1751 with EF of 60 to 65%, G2-DD and moderate LAE.  Initial CXR with interval increase in interstitial densities.  Repeat CXR concerning for pulmonary edema.  BNP 2300 (no prior to compare to)> 1000.  Diuretics held due to renal function and soft blood pressures but continues to diurese well.  Euvolemic on exam today.  Renal function improved.  Weight down from 157 pounds to 151 pounds. -Discharged on oral Lasix 40 mg daily and potassium supplementation -Recheck BMP and magnesium in 1 week -Daily weight, fluid and sodium restriction as above -Outpatient follow-up with cardiology  Aspiration pneumonia in the setting of dysphagia-felt unlikely given improvement in his CXR with diuretics.  COVID-19 negative.    Received IV Unasyn  3/16-3/17. -SLP recommended dysphagia 2 diet.   Idiopathic pulmonary fibrosis: CT high-resolution concerning for this. -Outpatient follow-up with pulmonology for further evaluation  Hypotension: Resolved. -Home Imdur discontinued -Lasix and atenolol as above  AKI on CKD-4/azotemia: baseline Cr 2.5-2.6> 3.11 (admit)> 3.46> 3.3> 2.85.    AKI likely due to hypotension, diuretics and/or high-dose aspirin.  Renal ultrasound without acute finding. -Adjusted cardiac meds as above. -Reduce aspirin to 81 mg daily -Recheck renal function in 1 week  Anemia of renal disease: Baseline Hgb 8-9> 8.1 (admit)>> 7.7>  8.6.  Low iron sat with high ferritin suggestive for some acute inflammatory process. -Continue iron supplementation with bowel regimen  New diagnosis of DM-2 with hyperglycemia: A1c 7.0%.  Hyperglycemia likely due to steroid CBG (last 3)  Recent Labs    09/20/19 2155 09/21/19 0626 09/21/19 1058  GLUCAP 215* 172* 243*  -Start low-dose glipizide. -Continue atorvastatin   Elevated troponin in patient with history of CAD: mildly elevated  HS trop 102>>>123 likely demand ischemia from CHF vs ACS.  No chest pain.  EKG with diffuse T wave changes.  Echo as above. -Continue statin and aspirin  Pulmonary nodule: CT chest revealed 1.7 cm RLL nodule opacity -Follow-up noncontrast CT in 3 to 6 months recommended.  Schizoaffective disorder with bipolar: Stable -Continue home medications  Elevated D-dimer: nonspecific especially in the setting of renal failure.  Lower extremity Doppler negative for DVT. Transitioned from IV heparin to subcu heparin as clinical suspicion for PE is very low.  Hypoxemia likely due to fibrosis, CHF and COPD.   History of BPH: No LUTS.  Renal ultrasound without acute finding. -Continue home Flomax  Debility/physical deconditioning -Continue PT/OT   Discharge Instructions  Discharge Instructions    (HEART FAILURE PATIENTS) Call MD:  Anytime  you have any of the following symptoms: 1) 3 pound weight gain in 24 hours or 5 pounds in 1 week 2) shortness of breath, with or without a dry hacking cough 3) swelling in the hands, feet or stomach 4) if you have to sleep on extra pillows at night in order to breathe.   Complete by: As directed    Call MD for:  difficulty breathing, headache or visual disturbances   Complete by: As directed    Call MD for:  extreme fatigue   Complete by: As directed    Call MD for:  persistant dizziness or light-headedness   Complete by: As directed    Call MD for:  persistant nausea and vomiting   Complete by: As directed    Diet - low sodium heart healthy   Complete by: As directed    Increase activity slowly   Complete by: As directed      Allergies as of 09/21/2019   No Known Allergies     Medication List    STOP taking these medications   aspirin 325 MG tablet Replaced by: aspirin 81 MG EC tablet     TAKE these medications   acetaminophen 325 MG tablet Commonly known as: TYLENOL Take 2 tablets (650 mg total) by mouth every 6 (six) hours as needed  for mild pain (or Fever >/= 101).   aspirin 81 MG EC tablet Take 1 tablet (81 mg total) by mouth daily. Start taking on: September 22, 2019 Replaces: aspirin 325 MG tablet   atenolol 25 MG tablet Commonly known as: TENORMIN Take 25 mg by mouth daily.   atorvastatin 20 MG tablet Commonly known as: LIPITOR Take 20 mg by mouth at bedtime.   benztropine 0.5 MG tablet Commonly known as: COGENTIN Take 0.5 mg by mouth 2 (two) times daily.   calcium gluconate 500 MG tablet Take 1 tablet (500 mg total) by mouth 2 (two) times daily.   clonazePAM 0.5 MG tablet Commonly known as: KLONOPIN Take 0.5 mg by mouth 2 (two) times daily.   [employer] SPECTRAVITE PO Take 1 tablet by mouth daily.   Dexilant 30 MG capsule Generic drug: Dexlansoprazole Take 30 mg by mouth daily.   ferrous sulfate 325 (65 FE) MG tablet Take 1 tablet (325 mg total) by mouth 2  (two) times daily with a meal.   fluticasone furoate-vilanterol 200-25 MCG/INH Aepb Commonly known as: BREO ELLIPTA Inhale 1 puff into the lungs daily. Start taking on: September 22, 2019   furosemide 40 MG tablet Commonly known as: LASIX Take 1 tablet (40 mg total) by mouth daily. Start taking on: September 22, 2019   polyethylene glycol powder 17 GM/SCOOP powder Commonly known as: MiraLax Take 17 g by mouth 2 (two) times daily as needed for moderate constipation.   potassium chloride SA 20 MEQ tablet Commonly known as: KLOR-CON Take 1 tablet (20 mEq total) by mouth daily. Start taking on: September 22, 2019   senna-docusate 8.6-50 MG tablet Commonly known as: Senokot-S Take 1 tablet by mouth 2 (two) times daily between meals as needed for mild constipation.   tamsulosin 0.4 MG Caps capsule Commonly known as: FLOMAX Take 1 capsule (0.4 mg total) by mouth daily after supper. What changed: when to take this   umeclidinium bromide 62.5 MCG/INH Aepb Commonly known as: INCRUSE ELLIPTA Inhale 1 puff into the lungs daily. Start taking on: September 22, 2019            Durable Medical Equipment  (From admission, onward)         Start     Ordered   09/20/19 1452  For home use only DME oxygen  Once    Comments: 2L during the day and 3L at night  Question Answer Comment  Length of Need Lifetime   Mode or (Route) Nasal cannula   Liters per Minute 3   Frequency Continuous (stationary and portable oxygen unit needed)   Oxygen delivery system Gas      09/20/19 1451          Consultations:  Pulmonology  Cardiology  Procedures/Studies:   DG Chest 2 View  Result Date: 09/19/2019 CLINICAL DATA:  Shortness of breath for several weeks. EXAM: CHEST - 2 VIEW COMPARISON:  Single-view of the chest 09/17/2019. PA and lateral chest 05/15/2018. FINDINGS: The patient is status post CABG. Heart size is upper normal. The lungs appear emphysematous with fibrosis. No consolidative process,  pneumothorax or effusion. Atherosclerosis noted. IMPRESSION: No acute disease. The lungs appear emphysematous with fibrosis. Atherosclerosis. Electronically Signed   By: Inge Rise M.D.   On: 09/19/2019 13:40   X-ray chest PA and lateral  Result Date: 09/05/2019 CLINICAL DATA:  Syncope EXAM: CHEST - 2 VIEW COMPARISON:  05/15/2018 FINDINGS: Increased density at the left lung base superimposed on chronic opacity. Chronic mild  interstitial prominence. No significant pleural effusion. No pneumothorax. Stable cardiomediastinal contours. No acute osseous abnormality. IMPRESSION: Left basilar atelectasis/consolidation superimposed on bibasilar fibrotic changes. Electronically Signed   By: Macy Mis M.D.   On: 09/05/2019 21:04   US RENAL  Result Date: 09/19/2019 CLINICAL DATA:  Acute kidney injury EXAM: RENAL / URINARY TRACT ULTRASOUND COMPLETE COMPARISON:  05/16/2018 FINDINGS: Right Kidney: Renal measurements: 9.5 x 3.9 x 4.2 cm = volume: 82 mL. Mild cortical thinning. Normal cortical echogenicity. Small simple cyst at inferior pole 1.3 x 1.5 x 1.8 cm. Additional small simple cyst at inferior pole 1.4 x 1.2 x 1.5 cm. No solid masses or hydronephrosis. No shadowing calcifications. Left Kidney: Renal measurements: 9.1 x 4.8 x 4.5 cm = volume: 102 mL. Cortical thinning. Upper normal cortical echogenicity. Exophytic simple cyst at inferior pole 3.6 x 3.4 x 3.1 cm. Additional simple exophytic cyst 1.8 x 1.9 x 1.7 cm. No solid mass or hydronephrosis. Bladder: Appears normal for degree of bladder distention. Other: N/A IMPRESSION: Mild age-related renal cortical thinning bilaterally. Small BILATERAL simple appearing renal cysts. No solid renal mass or hydronephrosis identified. Electronically Signed   By: Lavonia Dana M.D.   On: 09/19/2019 15:32   CT Chest High Resolution  Result Date: 09/19/2019 CLINICAL DATA:  Inpatient. Shortness of breath. Abnormal chest radiograph. Evaluate for interstitial lung disease.  Former smoker per epic. EXAM: CT CHEST WITHOUT CONTRAST TECHNIQUE: Multidetector CT imaging of the chest was performed following the standard protocol without intravenous contrast. High resolution imaging of the lungs, as well as inspiratory and expiratory imaging, was performed. COMPARISON:  Chest radiograph from earlier today. FINDINGS: Cardiovascular: Top-normal heart size. No significant pericardial effusion/thickening. Three-vessel coronary atherosclerosis status post CABG. Atherosclerotic nonaneurysmal thoracic aorta. Normal caliber pulmonary arteries. Mediastinum/Nodes: No discrete thyroid nodules. Unremarkable esophagus. No pathologically enlarged axillary, mediastinal or hilar lymph nodes, noting limited sensitivity for the detection of hilar adenopathy on this noncontrast study. Lungs/Pleura: No pneumothorax. No pleural effusion. Subsolid 1.7 cm nodular opacity in right lower lobe (series 6/image 149). No acute consolidative airspace disease, lung masses or additional significant pulmonary nodules. No significant lobular air trapping or evidence of tracheobronchomalacia on the expiration sequence. There is extensive patchy ground-glass opacity and peribronchovascular and subpleural reticulation throughout both lungs with associated mild-to-moderate traction bronchiectasis and architectural distortion. Peripheral and subpleural cystic changes throughout both lungs likely reflect a combination of emphysema and mild honeycombing. Findings are most prominent in the lower lobes. Upper abdomen: No acute abnormality. Musculoskeletal: No aggressive appearing focal osseous lesions. Several discontinuities throughout the sternotomy wires. Nonunion of mid to lower sternotomy. Moderate thoracic spondylosis. IMPRESSION: 1. Spectrum of findings compatible with fibrotic interstitial lung disease with mild-to-moderate traction bronchiectasis and probable mild honeycombing admixed with paraseptal emphysema. Prominent  ground-glass component may represent a superimposed process such as pulmonary edema, atypical infection or alveolar hemorrhage. Underlying fibrotic process probably represents usual interstitial pneumonia (UIP). Findings are categorized as probable UIP per consensus guidelines: Diagnosis of Idiopathic Pulmonary Fibrosis: An Official ATS/ERS/JRS/ALAT Clinical Practice Guideline. [place] Vol 198, Iss 5, [id number], Mar 05 2017. 2. Subsolid 1.7 cm right lower lobe nodular opacity. Follow-up non-contrast CT recommended at 3-6 months to confirm persistence. If unchanged, and solid component remains <6 mm, annual CT is recommended until 5 years of stability has been established. If persistent these nodules should be considered highly suspicious if the solid component of the nodule is 6 mm or greater in size and enlarging. This recommendation follows  the consensus statement: Guidelines for Management of Incidental Pulmonary Nodules Detected on CT Images: From the Fleischner Society 2017; Radiology 2017; 284:228-243. 3. Aortic Atherosclerosis (ICD10-I70.0) and Emphysema (ICD10-J43.9). Electronically Signed   By: Ilona Sorrel M.D.   On: 09/19/2019 20:30   DG Chest Port 1 View  Result Date: 09/17/2019 CLINICAL DATA:  Chest pain and shortness of breath. EXAM: PORTABLE CHEST 1 VIEW COMPARISON:  Radiograph earlier this day. Radiograph yesterday and earlier this month also reviewed. FINDINGS: Post cardiomegaly and CABG. Multiple broken sternal wires again seen. Mild cardiomegaly is stable. Aortic atherosclerosis. Reticular opacities likely represent underlying pulmonary fibrosis, however the degree of interstitial thickening is increased since exam earlier this month. Blunting of the right costophrenic angle may represent developing effusion. No pneumothorax. IMPRESSION: 1. Findings suspicious for pulmonary edema superimposed on pulmonary fibrosis/interstitial lung disease. 2. Blunting of the right  costophrenic angle may represent developing effusion. 3. Stable cardiomegaly post CABG. Electronically Signed   By: Keith Rake M.D.   On: 09/17/2019 13:50   DG Chest Port 1 View  Result Date: 09/17/2019 CLINICAL DATA:  Shortness of breath. EXAM: PORTABLE CHEST 1 VIEW COMPARISON:  September 16, 2019. FINDINGS: Stable cardiomediastinal silhouette. Atherosclerosis of thoracic aorta is noted. Sternotomy wires are noted. No pneumothorax is noted. No significant pleural effusion is noted. Bilateral lung opacities are noted which may represent bilateral pulmonary fibrosis or chronic interstitial lung disease, although acute superimposed edema or inflammation cannot be excluded. Bony thorax is unremarkable. IMPRESSION: Aortic atherosclerosis. Bilateral lung opacities are noted which may represent pulmonary fibrosis or chronic interstitial lung disease, although acute superimposed edema or inflammation cannot be excluded. Aortic Atherosclerosis (ICD10-I70.0). Electronically Signed   By: Marijo Conception M.D.   On: 09/17/2019 08:23   DG Chest Port 1 View  Result Date: 09/16/2019 CLINICAL DATA:  79 year old male with shortness of breath. EXAM: PORTABLE CHEST 1 VIEW COMPARISON:  Chest radiograph dated 09/05/2019. FINDINGS: There is diffuse interstitial coarsening and chronic bronchitic changes. There is however overall interval increase in the interstitial densities compared to the prior radiograph which may represent progression of underlying fibrosis or superimposition of pneumonia, possibly viral or atypical. Clinical correlation is recommended. No focal consolidation, pleural effusion, or pneumothorax. Stable cardiomediastinal silhouette. Median sternotomy wires and CABG vascular clips. No acute osseous pathology. IMPRESSION: Interval increase in the interstitial densities compared to the prior radiograph may represent progression of underlying fibrosis or superimposition of pneumonia. Clinical correlation is  recommended. Electronically Signed   By: Anner Crete M.D.   On: 09/16/2019 23:24   DG Swallowing Func-Speech Pathology  Result Date: 09/19/2019 Objective Swallowing Evaluation: Type of Study: MBS-Modified Barium Swallow Study  Patient Details Name: Everado Pillsbury MRN: [id number] Date of Birth: Feb 17, 1941 Today's Date: 09/19/2019 Time: SLP Start Time (ACUTE ONLY): 1310 -SLP Stop Time (ACUTE ONLY): 1327 SLP Time Calculation (min) (ACUTE ONLY): 17 min Past Medical History: Past Medical History: Diagnosis Date . Anemia  . Coronary artery disease  . GERD (gastroesophageal reflux disease)  . Hypertension  . Pulmonary fibrosis ([place])  . Schizo affective schizophrenia [facility])  Past Surgical History: Past Surgical History: Procedure Laterality Date . CORONARY ARTERY BYPASS GRAFT   HPI: Pt is a 79 y.o. male adm to [facility] with respiratory difficulties. Pt PMH + for GERD, schizoaffective d/o, pulmonary fibrosis, HLD, anemia, AKI, sepsis.  Pt desated to 75% during his lunch meal yesterday requiring increase in oxygen to 15 Liters on NRB.  Today he is on 8 Liters.  He is  noted to have broken sternal wires per imaging.  Pt with possible developing effusion, pulmonary edema on fibrosis/ILD.  During the initial evaluation the pt reported dysphagia to liquids more than foods causing him to cough. He also states pills will lodge in his throat - pointing to distal region/ ? pyriform sinus.  He denied expectorating food or liquid and stated he can "get it down".   Per recent admit note, group home staff reports pt has periods of time when his intake is poor but pt denies specific reasoning for this issue. Chest x-ray 3/17: Pt is a 79 y.o. male adm to [facility] with respiratory difficulties. Pt PMH + for GERD, schizoaffective d/o, pulmonary fibrosis, HLD, anemia, AKI, sepsis.  Pt desated to 75% during his lunch meal yesterday requiring increase in oxygen to 15 Liters on NRB.  Today he is on 8 Liters.  He is noted to have broken sternal wires per  imaging.  Pt with possible developing effusion, pulmonary edema on fibrosis/ILD.  During the initial evaluation the pt reported dysphagia to liquids more than foods causing him to cough. He also states pills will lodge in his throat - pointing to distal region/ ? pyriform sinus.  He denied expectorating food or liquid and stated he can "get it down".   Per recent admit note, group home staff reports pt has periods of time when his intake is poor but pt denies specific reasoning for this issue. Chest x-ray 3/17 was negative for acute changes.  Subjective: pt awake in bed Assessment / Plan / Recommendation CHL IP CLINICAL IMPRESSIONS 09/19/2019 Clinical Impression Pt presents with mild pharyngeal dysphagia characterized by a pharyngeal delay which facilitated penetration (PAS 3) with thin liquids and with consecutive swallows of nectar thick liquids via straw. Penetration was improved to a PAS level 2 with individual sips of nectar thick liquids. No instances of aspiration were noted but pt is at risk for aspiration with thin liquids and with use of consecutive swallows. Considering the appreciated aspiration risk combined with his reports, it is recommended that the current diet of dysphagia 3 solids and nectar thick liquids via cup be continued at this time. SLP will follow for dysphagia treatment. SLP Visit Diagnosis Dysphagia, pharyngeal phase (R13.13) Attention and concentration deficit following -- Frontal lobe and executive function deficit following -- Impact on safety and function Mild aspiration risk   CHL IP TREATMENT RECOMMENDATION 09/19/2019 Treatment Recommendations Therapy as outlined in treatment plan below   Prognosis 09/19/2019 Prognosis for Safe Diet Advancement Good Barriers to Reach Goals Time post onset Barriers/Prognosis Comment -- CHL IP DIET RECOMMENDATION 09/19/2019 SLP Diet Recommendations Dysphagia 2 (Fine chop) solids;Nectar thick liquid Liquid Administration via Cup;No straw Medication  Administration Whole meds with puree Compensations Slow rate;Small sips/bites Postural Changes Seated upright at 90 degrees   CHL IP OTHER RECOMMENDATIONS 09/19/2019 Recommended Consults -- Oral Care Recommendations Oral care BID Other Recommendations Order thickener from pharmacy   CHL IP FOLLOW UP RECOMMENDATIONS 09/19/2019 Follow up Recommendations Home health SLP   CHL IP FREQUENCY AND DURATION 09/19/2019 Speech Therapy Frequency (ACUTE ONLY) min 2x/week Treatment Duration 2 weeks      CHL IP ORAL PHASE 09/19/2019 Oral Phase WFL Oral - Pudding Teaspoon -- Oral - Pudding Cup -- Oral - Honey Teaspoon -- Oral - Honey Cup -- Oral - Nectar Teaspoon -- Oral - Nectar Cup -- Oral - Nectar Straw -- Oral - Thin Teaspoon -- Oral - Thin Cup -- Oral - Thin Straw -- Oral -  Puree -- Oral - Mech Soft -- Oral - Regular -- Oral - Multi-Consistency -- Oral - Pill -- Oral Phase - Comment --  CHL IP PHARYNGEAL PHASE 09/19/2019 Pharyngeal Phase Impaired Pharyngeal- Pudding Teaspoon -- Pharyngeal -- Pharyngeal- Pudding Cup -- Pharyngeal -- Pharyngeal- Honey Teaspoon -- Pharyngeal -- Pharyngeal- Honey Cup -- Pharyngeal -- Pharyngeal- Nectar Teaspoon -- Pharyngeal -- Pharyngeal- Nectar Cup -- Pharyngeal -- Pharyngeal- Nectar Straw -- Pharyngeal -- Pharyngeal- Thin Teaspoon -- Pharyngeal -- Pharyngeal- Thin Cup -- Pharyngeal -- Pharyngeal- Thin Straw -- Pharyngeal -- Pharyngeal- Puree -- Pharyngeal -- Pharyngeal- Mechanical Soft -- Pharyngeal -- Pharyngeal- Regular -- Pharyngeal -- Pharyngeal- Multi-consistency -- Pharyngeal -- Pharyngeal- Pill -- Pharyngeal -- Pharyngeal Comment --  Shanika I. Hardin Negus, [place], [place] Office number [phone] Pager [phone] 09/19/2019, 3:05 PM              ECHOCARDIOGRAM COMPLETE  Result Date: 09/06/2019    ECHOCARDIOGRAM REPORT   Patient Name:   ISSACHAR BROADY Date of Exam: 09/06/2019 Medical Rec #:  [id number]    Height:       68.0 in Accession #:     [id number]   Weight:       158.5 lb Date of Birth:  1941/02/28    BSA:          1.851 m Patient Age:    89 years     BP:           132/65 mmHg Patient Gender: M            HR:           67 bpm. Exam Location:  Inpatient Procedure: 2D Echo Indications:    Syncope R55  History:        Patient has prior history of Echocardiogram examinations and                 Patient has no prior history of Echocardiogram examinations.                 CAD, Prior CABG; Risk Factors:Hypertension.  Sonographer:    Mikki Santee RDCS (AE) Referring Phys: Frannie  1. Left ventricular ejection fraction, by estimation, is 60 to 65%. The left ventricle has normal function. The left ventricle has no regional wall motion abnormalities. There is mild concentric left ventricular hypertrophy. Left ventricular diastolic parameters are consistent with Grade II diastolic dysfunction (pseudonormalization). Elevated left atrial pressure.  2. Right ventricular systolic function is normal. The right ventricular size is normal. Tricuspid regurgitation signal is inadequate for assessing PA pressure.  3. Left atrial size was moderately dilated.  4. The mitral valve is normal in structure and function. Trivial mitral valve regurgitation. No evidence of mitral stenosis.  5. The aortic valve is normal in structure and function. Aortic valve regurgitation is not visualized. Mild aortic valve sclerosis is present, with no evidence of aortic valve stenosis.  6. The inferior vena cava is dilated in size with >50% respiratory variability, suggesting right atrial pressure of 8 mmHg. FINDINGS  Left Ventricle: Left ventricular ejection fraction, by estimation, is 60 to 65%. The left ventricle has normal function. The left ventricle has no regional wall motion abnormalities. The left ventricular internal cavity size was normal in size. There is  mild concentric left ventricular hypertrophy. Left ventricular diastolic parameters are  consistent with Grade II diastolic dysfunction (pseudonormalization). Elevated left atrial pressure. Right Ventricle: The right ventricular size is normal. No  increase in right ventricular wall thickness. Right ventricular systolic function is normal. Tricuspid regurgitation signal is inadequate for assessing PA pressure. Left Atrium: Left atrial size was moderately dilated. Right Atrium: Right atrial size was normal in size. Pericardium: There is no evidence of pericardial effusion. Mitral Valve: The mitral valve is normal in structure and function. Normal mobility of the mitral valve leaflets. Mild mitral annular calcification. Trivial mitral valve regurgitation. No evidence of mitral valve stenosis. Tricuspid Valve: The tricuspid valve is normal in structure. Tricuspid valve regurgitation is not demonstrated. No evidence of tricuspid stenosis. Aortic Valve: The aortic valve is normal in structure and function. Aortic valve regurgitation is not visualized. Mild aortic valve sclerosis is present, with no evidence of aortic valve stenosis. Pulmonic Valve: The pulmonic valve was normal in structure. Pulmonic valve regurgitation is trivial. No evidence of pulmonic stenosis. Aorta: The aortic root is normal in size and structure. Venous: The inferior vena cava is dilated in size with greater than 50% respiratory variability, suggesting right atrial pressure of 8 mmHg. IAS/Shunts: No atrial level shunt detected by color flow Doppler.  LEFT VENTRICLE PLAX 2D LVIDd:         4.50 cm  Diastology LVIDs:         3.10 cm  LV e' lateral:   8.70 cm/s LV PW:         1.20 cm  LV E/e' lateral: 11.5 LV IVS:        1.30 cm  LV e' medial:    5.87 cm/s LVOT diam:     1.95 cm  LV E/e' medial:  17.0 LV SV:         51 LV SV Index:   28 LVOT Area:     2.99 cm  RIGHT VENTRICLE RV S prime:     10.30 cm/s TAPSE (M-mode): 1.7 cm LEFT ATRIUM             Index       RIGHT ATRIUM           Index LA diam:        4.90 cm 2.65 cm/m  RA Area:      15.40 cm LA Vol (A2C):   55.7 ml 30.08 ml/m RA Volume:   30.30 ml  16.37 ml/m LA Vol (A4C):   68.2 ml 36.84 ml/m LA Biplane Vol: 65.2 ml 35.22 ml/m  AORTIC VALVE LVOT Vmax:   76.30 cm/s LVOT Vmean:  53.700 cm/s LVOT VTI:    0.172 m  AORTA Ao Root diam: 3.00 cm MITRAL VALVE MV Area (PHT): 3.89 cm    SHUNTS MV Decel Time: 195 msec    Systemic VTI:  0.17 m MV E velocity: 99.90 cm/s  Systemic Diam: 1.95 cm MV A velocity: 73.50 cm/s MV E/A ratio:  1.36 Mihai Croitoru MD Electronically signed by Sanda Klein MD Signature Date/Time: 09/06/2019/2:51:24 PM    Final    VAS [place] LOWER EXTREMITY VENOUS (DVT) (ONLY [facility] & [facility])  Result Date: 09/17/2019  Lower Venous DVTStudy Indications: Edema.  Limitations: Poor ultrasound/tissue interface and body habitus. Comparison Study: No prior study Performing Technologist: Maudry Mayhew MHA, RDMS, RVT, RDCS  Examination Guidelines: A complete evaluation includes B-mode imaging, spectral Doppler, color Doppler, and power Doppler as needed of all accessible portions of each vessel. Bilateral testing is considered an integral part of a complete examination. Limited examinations for reoccurring indications may be performed as noted. The reflux portion of the exam is performed with the patient in  reverse Trendelenburg.  +---------+---------------+---------+-----------+----------+--------------+ RIGHT    CompressibilityPhasicitySpontaneityPropertiesThrombus Aging +---------+---------------+---------+-----------+----------+--------------+ CFV      Full           No       Yes                                 +---------+---------------+---------+-----------+----------+--------------+ SFJ      Full                                                        +---------+---------------+---------+-----------+----------+--------------+ FV Prox  Full                                                         +---------+---------------+---------+-----------+----------+--------------+ FV Mid   Full                                                        +---------+---------------+---------+-----------+----------+--------------+ FV DistalFull                                                        +---------+---------------+---------+-----------+----------+--------------+ PFV      Full                                                        +---------+---------------+---------+-----------+----------+--------------+ POP      Full           No       Yes                                 +---------+---------------+---------+-----------+----------+--------------+ PTV      Full                                                        +---------+---------------+---------+-----------+----------+--------------+ PERO     Full                                                        +---------+---------------+---------+-----------+----------+--------------+   +---------+---------------+---------+-----------+----------+--------------+ LEFT     CompressibilityPhasicitySpontaneityPropertiesThrombus Aging +---------+---------------+---------+-----------+----------+--------------+ CFV      Full           No       Yes                                 +---------+---------------+---------+-----------+----------+--------------+  SFJ      Full                                                        +---------+---------------+---------+-----------+----------+--------------+ FV Prox  Full                                                        +---------+---------------+---------+-----------+----------+--------------+ FV Mid   Full                                                        +---------+---------------+---------+-----------+----------+--------------+ FV DistalFull                                                         +---------+---------------+---------+-----------+----------+--------------+ PFV      Full                                                        +---------+---------------+---------+-----------+----------+--------------+ POP      Full           No       Yes                                 +---------+---------------+---------+-----------+----------+--------------+ PTV      Full                                                        +---------+---------------+---------+-----------+----------+--------------+ PERO     Full                                                        +---------+---------------+---------+-----------+----------+--------------+     Summary: RIGHT: - There is no evidence of deep vein thrombosis in the lower extremity.  - No cystic structure found in the popliteal fossa.  LEFT: - There is no evidence of deep vein thrombosis in the lower extremity.  - No cystic structure found in the popliteal fossa.  Bilateral lower extremity venous flow is pulsatile, suggestive of possible elevated right heart pressure. *See table(s) above for measurements and observations. Electronically signed by Ruta Hinds MD on 09/17/2019 at 7:07:29 PM.    Final        Discharge Exam: Vitals:   09/21/19 0813 09/21/19 1102  BP: (!) 118/41 Marland Kitchen)  132/52  Pulse: 67 68  Resp: (!) 25 20  Temp: 97.7 F (36.5 C) 98 F (36.7 C)  SpO2: 94% 94%    GENERAL: No acute distress.  Appears well.  HEENT: MMM.  Vision and hearing grossly intact.  NECK: Supple.  No apparent JVD.  RESP: On 2 L by [place].  No IWOB.  Fair aeration bilaterally.  Bibasilar crackles CVS:  RRR. Heart sounds normal.  ABD/GI/GU: Bowel sounds present. Soft. Non tender.  MSK/EXT:  Moves extremities. No apparent deformity or edema.  SKIN: no apparent skin lesion or wound NEURO: Awake, alert and oriented fairly.  No apparent focal neuro deficit. PSYCH: Calm. Normal affect.   The results of significant diagnostics from  this hospitalization (including imaging, microbiology, ancillary and laboratory) are listed below for reference.     Microbiology: Recent Results (from the past 240 hour(s))  SARS CORONAVIRUS 2 (TAT 6-24 HRS) Nasopharyngeal Nasopharyngeal Swab     Status: None   Collection Time: 09/17/19  2:24 AM   Specimen: Nasopharyngeal Swab  Result Value Ref Range Status   SARS Coronavirus 2 NEGATIVE NEGATIVE Final    Comment: (NOTE) SARS-CoV-2 target nucleic acids are NOT DETECTED. The SARS-CoV-2 RNA is generally detectable in upper and lower respiratory specimens during the acute phase of infection. Negative results do not preclude SARS-CoV-2 infection, do not rule out co-infections with other pathogens, and should not be used as the sole basis for treatment or other patient management decisions. Negative results must be combined with clinical observations, patient history, and epidemiological information. The expected result is Negative. Fact Sheet for Patients: SugarRoll.be Fact Sheet for Healthcare Providers: https://www.woods-mathews.com/ This test is not yet approved or cleared by the [place] FDA and  has been authorized for detection and/or diagnosis of SARS-CoV-2 by FDA under an Emergency Use Authorization (EUA). This EUA will remain  in effect (meaning this test can be used) for the duration of the COVID-19 declaration under Section 56 4(b)(1) of the Act, 21 U.S.C. section 360bbb-3(b)(1), unless the authorization is terminated or revoked sooner. Performed at [facility], [place] [address] [place], [place] [address]   MRSA PCR Screening     Status: None   Collection Time: 09/17/19 11:12 PM   Specimen: Nasopharyngeal  Result Value Ref Range Status   MRSA by PCR NEGATIVE NEGATIVE Final    Comment:        The GeneXpert MRSA Assay (FDA approved for NASAL specimens only), is one component of a comprehensive MRSA  colonization surveillance program. It is not intended to diagnose MRSA infection nor to guide or monitor treatment for MRSA infections. Performed at [facility], [place] [address], [place], [place] [address]      Labs: BNP (last 3 results) Recent Labs    09/18/19 0739 09/19/19 0240 09/20/19 0238  BNP 2,505.2* 1,039.5* 1,700.1*   Basic Metabolic Panel: Recent Labs  Lab 09/17/19 0827 09/18/19 0115 09/19/19 0240 09/20/19 0238 09/21/19 0200  NA 141 141 137 136 138  K 3.9 4.2 4.0 4.3 3.4*  CL 104 103 98 97* 100  CO2 22 23 23 23 25   GLUCOSE 160* 242* 287* 329* 188*  BUN 44* 52* 68* 73* 68*  CREATININE 3.05* 3.21* 3.46* 3.29* 2.85*  CALCIUM 6.7* 6.8* 7.0* 6.9* 7.7*  MG 1.1* 1.0* 2.3 2.0 1.6*   Liver Function Tests: Recent Labs  Lab 09/16/19 2244  AST 50*  ALT 48*  ALKPHOS 86  BILITOT 0.6  PROT 6.4*  ALBUMIN  2.9*   No results for input(s): LIPASE, AMYLASE in the last 168 hours. No results for input(s): AMMONIA in the last 168 hours. CBC: Recent Labs  Lab 09/16/19 2244 09/16/19 2244 09/17/19 0827 09/18/19 0115 09/19/19 0240 09/20/19 0238 09/21/19 0200  WBC 8.7   < > 9.2 8.5 12.8* 12.5* 12.0*  NEUTROABS 7.2  --   --   --   --   --   --   HGB 8.1*   < > 7.7* 7.8* 7.7* 7.9* 8.6*  HCT 26.3*   < > 25.0* 24.2* 23.9* 24.7* 27.3*  MCV 96.3   < > 96.5 91.3 91.2 90.8 91.9  PLT 241   < > 253 261 293 301 333   < > = values in this interval not displayed.   Cardiac Enzymes: No results for input(s): CKTOTAL, CKMB, CKMBINDEX, TROPONINI in the last 168 hours. BNP: Invalid input(s): POCBNP CBG: Recent Labs  Lab 09/20/19 0751 09/20/19 1109 09/20/19 1657 09/20/19 2155 09/21/19 0626  GLUCAP 315* 287* 243* 215* 172*   D-Dimer Recent Labs    09/19/19 0240 09/20/19 0238  DDIMER 2.97* 2.40*   Hgb A1c Recent Labs    09/20/19 0238  HGBA1C 7.0*   Lipid Profile Recent Labs    09/21/19 0200  CHOL 85  HDL 33*  LDLCALC 41  TRIG 56  CHOLHDL 2.6    Thyroid function studies No results for input(s): TSH, T4TOTAL, T3FREE, THYROIDAB in the last 72 hours.  Invalid input(s): FREET3 Anemia work up Recent Labs    09/18/19 1132  VITAMINB12 468  FOLATE 10.7  FERRITIN 1,124*  TIBC 216*  IRON 25*  RETICCTPCT 3.4*   Urinalysis    Component Value Date/Time   COLORURINE STRAW (A) 09/17/2019 0415   APPEARANCEUR CLEAR 09/17/2019 0415   LABSPEC 1.009 09/17/2019 0415   [place] 5.0 09/17/2019 0415   GLUCOSEU NEGATIVE 09/17/2019 0415   HGBUR NEGATIVE 09/17/2019 0415   BILIRUBINUR NEGATIVE 09/17/2019 0415   KETONESUR NEGATIVE 09/17/2019 0415   PROTEINUR NEGATIVE 09/17/2019 0415   NITRITE NEGATIVE 09/17/2019 0415   LEUKOCYTESUR NEGATIVE 09/17/2019 0415   Sepsis Labs Invalid input(s): PROCALCITONIN,  WBC,  LACTICIDVEN   Time coordinating discharge: 45 minutes  SIGNED:  Mercy Riding, MD  [facility] 09/21/2019, 11:27 AM  If 7PM-7AM, please contact night-coverage [email or web address] Password [email or web address]

## 2019-09-21 NOTE — Progress Notes (Addendum)
Occupational Therapy Treatment Patient Details Name: Robert Little MRN: [id number] DOB: 1941-05-24 Today's Date: 09/21/2019    History of present illness 79 y.o. male with medical history significant for chronic anemia, pulmonary fibrosis, hypertension, coronary artery disease, chronic diastolic CHF, and schizoaffective disorder, presenting to the emergency department with progressive shortness of breath long with severe orthopnea starting 1 to 2 days prior to hospital visit, in the hospital work-up suggestive of CHF and acute hypoxic respiratory failure and he was admitted 09/16/19   OT comments  Pt making progress toward OT goals. Focused session on BADL mobility progression with ECS implementation. Pt complete functional mobility a household distance with RW at min guard level. Mod-max cues needed for pacing and ECS strategies- specifically pursed lip breathing. Pt on 2L [place] at rest with VSS. Pt requires up to 8L [place] to sustain O2 sats above 88% while completing functional mobility and BADLs. Pt recovered back to 2L [place] while at rest. Pt will continue to engage in activity and de sat unless consistently supervised and cued. D/c recs remain appropriate. Will continue to follow.   Follow Up Recommendations  Home health OT;Supervision - Intermittent    Equipment Recommendations  None recommended by OT    Recommendations for Other Services      Precautions / Restrictions Precautions Precautions: Fall;Other (comment) Precaution Comments: watch O2 Restrictions Weight Bearing Restrictions: No       Mobility Bed Mobility Overal bed mobility: Needs Assistance Bed Mobility: Sit to Supine     Supine to sit: Supervision        Transfers Overall transfer level: Needs assistance Equipment used: Rolling walker (2 wheeled) Transfers: Sit to/from Stand Sit to Stand: Min guard         General transfer comment: min guard for safety, cues for hand placement     Balance Overall balance  assessment: Needs assistance Sitting-balance support: No upper extremity supported;Feet supported Sitting balance-Leahy Scale: Good     Standing balance support: Bilateral upper extremity supported;During functional activity Standing balance-Leahy Scale: Fair Standing balance comment: reliant on UE support for dynamic balance                           ADL either performed or assessed with clinical judgement   ADL Overall ADL's : Needs assistance/impaired                         Toilet Transfer: Min guard;Ambulation;RW;Cueing for safety           Functional mobility during ADLs: Min guard;Rolling walker;Cueing for safety General ADL Comments: able to tolerate increased BADL distance, but continues to require increased amount of oxygen and supervision and safety cues to manage this     Vision Patient Visual Report: No change from baseline     Perception     Praxis      Cognition Arousal/Alertness: Awake/alert Behavior During Therapy: WFL for tasks assessed/performed Overall Cognitive Status: History of cognitive impairments - at baseline                                 General Comments: lives in group home at baseline and has supervision for all safety        Exercises     Shoulder Instructions       General Comments      Pertinent Vitals/ Pain  Pain Assessment: No/denies pain  Home Living                                          Prior Functioning/Environment              Frequency  Min 2X/week        Progress Toward Goals  OT Goals(current goals can now be found in the care plan section)  Progress towards OT goals: Progressing toward goals  Acute Rehab OT Goals Patient Stated Goal: go home OT Goal Formulation: With patient Time For Goal Achievement: 10/03/19 Potential to Achieve Goals: Good  Plan Discharge plan remains appropriate    Co-evaluation                 AM-PAC  OT "6 Clicks" Daily Activity     Outcome Measure   Help from another person eating meals?: None Help from another person taking care of personal grooming?: A Little Help from another person toileting, which includes using toliet, bedpan, or urinal?: A Little Help from another person bathing (including washing, rinsing, drying)?: A Little Help from another person to put on and taking off regular upper body clothing?: None Help from another person to put on and taking off regular lower body clothing?: A Little 6 Click Score: 20    End of Session Equipment Utilized During Treatment: Rolling walker;Oxygen;Gait belt  OT Visit Diagnosis: Unsteadiness on feet (R26.81);Muscle weakness (generalized) (M62.81)   Activity Tolerance Patient tolerated treatment well   Patient Left in bed;with call bell/phone within reach;with bed alarm set   Nurse Communication Mobility status        Time: 4401-0272 OT Time Calculation (min): 21 min  Charges: OT General Charges $OT Visit: 1 Visit OT Treatments $Self Care/Home Management : 8-22 mins  Zenovia Jarred, MSOT, OTR/L [facility] Office Number: [phone] Pager: [phone]  Zenovia Jarred 09/21/2019, 12:50 PM

## 2019-09-21 NOTE — Plan of Care (Signed)

## 2019-09-21 NOTE — Progress Notes (Signed)
Chaplain engaged in initial visit with Mr. Robert Little.  Mr. Robert Little explained his prognosis to chaplain.  Chaplain asked how he was feeling about his diagnoses and he stated, "I'm fine."  Mr. Robert Little later expressed that he was ready to get up and walk, and nurse affirmed later that he would be walking after lunch.  Chaplain offered support.  Chaplain offered the ministry of presence and listening.   Chaplain will follow-up.

## 2019-09-21 NOTE — Progress Notes (Signed)
SATURATION QUALIFICATIONS: (This note is used to comply with regulatory documentation for home oxygen)  Patient Saturations on Room Air at Rest = 91%  Patient Saturations on Room Air while Ambulating = 70%  Patient Saturations on 2 Liters of oxygen while Ambulating = 90%  Please briefly explain why patient needs home oxygen: Patient oxygen Saturation unable to be maintained above 88% if moving in the bed or going to the bathroom, or ambulating.  Prolonged slow increase of minutes to raise saturation to above 88%

## 2019-09-21 NOTE — Progress Notes (Signed)
Patient via Wheelchair to waiting car.  Discharge instructions extensively reviewed with group home leader.  Questions answered and resources given for questions for care

## 2019-09-21 NOTE — Progress Notes (Signed)
Progress Note  Patient Name: Robert Little Date of Encounter: 09/21/2019  Primary Cardiologist: Sanda Klein, MD   Subjective   Seems to be markedly improved since admission.  Breathing is good at baseline.  Oxygenating well although his nasal cannula is halfway across his face.  Inpatient Medications    Scheduled Meds: . atorvastatin  20 mg Oral q1800  . benztropine  0.5 mg Oral BID  . feeding supplement (PRO-STAT SUGAR FREE 64)  30 mL Oral BID  . ferrous sulfate  325 mg Oral TID WC  . fluticasone furoate-vilanterol  1 puff Inhalation Daily  . heparin injection (subcutaneous)  5,000 Units Subcutaneous Q8H  . insulin aspart  0-5 Units Subcutaneous QHS  . insulin aspart  0-9 Units Subcutaneous TID WC  . multivitamin with minerals  1 tablet Oral Daily  . pantoprazole  40 mg Oral Daily  . tamsulosin  0.4 mg Oral Daily  . umeclidinium bromide  1 puff Inhalation Daily   Continuous Infusions:  PRN Meds: acetaminophen, clonazePAM, guaiFENesin-dextromethorphan, ondansetron (ZOFRAN) IV, Resource ThickenUp Clear   Vital Signs    Vitals:   09/20/19 2000 09/20/19 2352 09/21/19 0246 09/21/19 0813  BP: (!) 132/55 139/62 (!) 139/51 (!) 118/41  Pulse: 64 (!) 58 68 67  Resp: 18 (!) 22 19 (!) 25  Temp: 97.6 F (36.4 C) 97.6 F (36.4 C) 97.8 F (36.6 C) 97.7 F (36.5 C)  TempSrc: Axillary Oral Oral Oral  SpO2: 94% 95% 93% 94%  Weight:   68.7 kg   Height:        Intake/Output Summary (Last 24 hours) at 09/21/2019 0852 Last data filed at 09/21/2019 0700 Gross per 24 hour  Intake 850 ml  Output 351 ml  Net 499 ml   Last 3 Weights 09/21/2019 09/20/2019 09/19/2019  Weight (lbs) 151 lb 7.3 oz 154 lb 1.6 oz 150 lb 9.2 oz  Weight (kg) 68.7 kg 69.9 kg 68.3 kg      Telemetry    NSR - Personally Reviewed  ECG    No new tracing - Personally Reviewed  Physical Exam   Appears comfortable lying flat. GEN: No acute distress.  Very poor dentition.  Neck: No JVD Cardiac: RRR,  faint early peaking systolic ejection murmur, no diastolic murmurs, rubs, or gallops.  Respiratory: Clear to auscultation bilaterally. GI: Soft, nontender, non-distended  MS: No edema; No deformity. Neuro:  Nonfocal  Psych: Normal affect   Labs    High Sensitivity Troponin:   Recent Labs  Lab 09/05/19 2156 09/16/19 2244 09/17/19 0121 09/17/19 0449 09/17/19 1351  TROPONINIHS 15 80* 102* 133* 123*      Chemistry Recent Labs  Lab 09/16/19 2244 09/17/19 0827 09/19/19 0240 09/20/19 0238 09/21/19 0200  NA 140   < > 137 136 138  K 4.7   < > 4.0 4.3 3.4*  CL 107   < > 98 97* 100  CO2 16*   < > 23 23 25   GLUCOSE 205*   < > 287* 329* 188*  BUN 43*   < > 68* 73* 68*  CREATININE 3.11*   < > 3.46* 3.29* 2.85*  CALCIUM 6.9*   < > 7.0* 6.9* 7.7*  PROT 6.4*  --   --   --   --   ALBUMIN 2.9*  --   --   --   --   AST 50*  --   --   --   --   ALT 48*  --   --   --   --  ALKPHOS 86  --   --   --   --   BILITOT 0.6  --   --   --   --   GFRNONAA 18*   < > 16* 17* 20*  GFRAA 21*   < > 19* 20* 24*  ANIONGAP 17*   < > 16* 16* 13   < > = values in this interval not displayed.     Hematology Recent Labs  Lab 09/19/19 0240 09/20/19 0238 09/21/19 0200  WBC 12.8* 12.5* 12.0*  RBC 2.62* 2.72* 2.97*  HGB 7.7* 7.9* 8.6*  HCT 23.9* 24.7* 27.3*  MCV 91.2 90.8 91.9  MCH 29.4 29.0 29.0  MCHC 32.2 32.0 31.5  RDW 14.1 14.0 14.1  PLT 293 301 333    BNP Recent Labs  Lab 09/18/19 0739 09/19/19 0240 09/20/19 0238  BNP 2,505.2* 1,039.5* 1,053.7*     DDimer  Recent Labs  Lab 09/18/19 0739 09/19/19 0240 09/20/19 0238  DDIMER 4.72* 2.97* 2.40*     Radiology    DG Chest 2 View  Result Date: 09/19/2019 CLINICAL DATA:  Shortness of breath for several weeks. EXAM: CHEST - 2 VIEW COMPARISON:  Single-view of the chest 09/17/2019. PA and lateral chest 05/15/2018. FINDINGS: The patient is status post CABG. Heart size is upper normal. The lungs appear emphysematous with fibrosis. No  consolidative process, pneumothorax or effusion. Atherosclerosis noted. IMPRESSION: No acute disease. The lungs appear emphysematous with fibrosis. Atherosclerosis. Electronically Signed   By: Inge Rise M.D.   On: 09/19/2019 13:40   US RENAL  Result Date: 09/19/2019 CLINICAL DATA:  Acute kidney injury EXAM: RENAL / URINARY TRACT ULTRASOUND COMPLETE COMPARISON:  05/16/2018 FINDINGS: Right Kidney: Renal measurements: 9.5 x 3.9 x 4.2 cm = volume: 82 mL. Mild cortical thinning. Normal cortical echogenicity. Small simple cyst at inferior pole 1.3 x 1.5 x 1.8 cm. Additional small simple cyst at inferior pole 1.4 x 1.2 x 1.5 cm. No solid masses or hydronephrosis. No shadowing calcifications. Left Kidney: Renal measurements: 9.1 x 4.8 x 4.5 cm = volume: 102 mL. Cortical thinning. Upper normal cortical echogenicity. Exophytic simple cyst at inferior pole 3.6 x 3.4 x 3.1 cm. Additional simple exophytic cyst 1.8 x 1.9 x 1.7 cm. No solid mass or hydronephrosis. Bladder: Appears normal for degree of bladder distention. Other: N/A IMPRESSION: Mild age-related renal cortical thinning bilaterally. Small BILATERAL simple appearing renal cysts. No solid renal mass or hydronephrosis identified. Electronically Signed   By: Lavonia Dana M.D.   On: 09/19/2019 15:32   CT Chest High Resolution  Result Date: 09/19/2019 CLINICAL DATA:  Inpatient. Shortness of breath. Abnormal chest radiograph. Evaluate for interstitial lung disease. Former smoker per epic. EXAM: CT CHEST WITHOUT CONTRAST TECHNIQUE: Multidetector CT imaging of the chest was performed following the standard protocol without intravenous contrast. High resolution imaging of the lungs, as well as inspiratory and expiratory imaging, was performed. COMPARISON:  Chest radiograph from earlier today. FINDINGS: Cardiovascular: Top-normal heart size. No significant pericardial effusion/thickening. Three-vessel coronary atherosclerosis status post CABG. Atherosclerotic  nonaneurysmal thoracic aorta. Normal caliber pulmonary arteries. Mediastinum/Nodes: No discrete thyroid nodules. Unremarkable esophagus. No pathologically enlarged axillary, mediastinal or hilar lymph nodes, noting limited sensitivity for the detection of hilar adenopathy on this noncontrast study. Lungs/Pleura: No pneumothorax. No pleural effusion. Subsolid 1.7 cm nodular opacity in right lower lobe (series 6/image 149). No acute consolidative airspace disease, lung masses or additional significant pulmonary nodules. No significant lobular air trapping or evidence of tracheobronchomalacia on the expiration  sequence. There is extensive patchy ground-glass opacity and peribronchovascular and subpleural reticulation throughout both lungs with associated mild-to-moderate traction bronchiectasis and architectural distortion. Peripheral and subpleural cystic changes throughout both lungs likely reflect a combination of emphysema and mild honeycombing. Findings are most prominent in the lower lobes. Upper abdomen: No acute abnormality. Musculoskeletal: No aggressive appearing focal osseous lesions. Several discontinuities throughout the sternotomy wires. Nonunion of mid to lower sternotomy. Moderate thoracic spondylosis. IMPRESSION: 1. Spectrum of findings compatible with fibrotic interstitial lung disease with mild-to-moderate traction bronchiectasis and probable mild honeycombing admixed with paraseptal emphysema. Prominent ground-glass component may represent a superimposed process such as pulmonary edema, atypical infection or alveolar hemorrhage. Underlying fibrotic process probably represents usual interstitial pneumonia (UIP). Findings are categorized as probable UIP per consensus guidelines: Diagnosis of Idiopathic Pulmonary Fibrosis: An Official ATS/ERS/JRS/ALAT Clinical Practice Guideline. [place] Vol 198, Iss 5, [id number], Mar 05 2017. 2. Subsolid 1.7 cm right lower lobe nodular opacity.  Follow-up non-contrast CT recommended at 3-6 months to confirm persistence. If unchanged, and solid component remains <6 mm, annual CT is recommended until 5 years of stability has been established. If persistent these nodules should be considered highly suspicious if the solid component of the nodule is 6 mm or greater in size and enlarging. This recommendation follows the consensus statement: Guidelines for Management of Incidental Pulmonary Nodules Detected on CT Images: From the Fleischner Society 2017; Radiology 2017; 284:228-243. 3. Aortic Atherosclerosis (ICD10-I70.0) and Emphysema (ICD10-J43.9). Electronically Signed   By: Ilona Sorrel M.D.   On: 09/19/2019 20:30   DG Swallowing Func-Speech Pathology  Result Date: 09/19/2019 Objective Swallowing Evaluation: Type of Study: MBS-Modified Barium Swallow Study  Patient Details Name: Janari Yamada MRN: [id number] Date of Birth: [birth year] Today's Date: 09/19/2019 Time: SLP Start Time (ACUTE ONLY): 1310 -SLP Stop Time (ACUTE ONLY): 1327 SLP Time Calculation (min) (ACUTE ONLY): 17 min Past Medical History: Past Medical History: Diagnosis Date . Anemia  . Coronary artery disease  . GERD (gastroesophageal reflux disease)  . Hypertension  . Pulmonary fibrosis ([place])  . Schizo affective schizophrenia [facility])  Past Surgical History: Past Surgical History: Procedure Laterality Date . CORONARY ARTERY BYPASS GRAFT   HPI: Pt is a 79 y.o. male adm to [facility] with respiratory difficulties. Pt PMH + for GERD, schizoaffective d/o, pulmonary fibrosis, HLD, anemia, AKI, sepsis.  Pt desated to 75% during his lunch meal yesterday requiring increase in oxygen to 15 Liters on NRB.  Today he is on 8 Liters.  He is noted to have broken sternal wires per imaging.  Pt with possible developing effusion, pulmonary edema on fibrosis/ILD.  During the initial evaluation the pt reported dysphagia to liquids more than foods causing him to cough. He also states pills will lodge in his throat - pointing  to distal region/ ? pyriform sinus.  He denied expectorating food or liquid and stated he can "get it down".   Per recent admit note, group home staff reports pt has periods of time when his intake is poor but pt denies specific reasoning for this issue. Chest x-ray 3/17: Pt is a 79 y.o. male adm to [facility] with respiratory difficulties. Pt PMH + for GERD, schizoaffective d/o, pulmonary fibrosis, HLD, anemia, AKI, sepsis.  Pt desated to 75% during his lunch meal yesterday requiring increase in oxygen to 15 Liters on NRB.  Today he is on 8 Liters.  He is noted to have broken sternal wires per imaging.  Pt with possible developing effusion, pulmonary  edema on fibrosis/ILD.  During the initial evaluation the pt reported dysphagia to liquids more than foods causing him to cough. He also states pills will lodge in his throat - pointing to distal region/ ? pyriform sinus.  He denied expectorating food or liquid and stated he can "get it down".   Per recent admit note, group home staff reports pt has periods of time when his intake is poor but pt denies specific reasoning for this issue. Chest x-ray 3/17 was negative for acute changes.  Subjective: pt awake in bed Assessment / Plan / Recommendation CHL IP CLINICAL IMPRESSIONS 09/19/2019 Clinical Impression Pt presents with mild pharyngeal dysphagia characterized by a pharyngeal delay which facilitated penetration (PAS 3) with thin liquids and with consecutive swallows of nectar thick liquids via straw. Penetration was improved to a PAS level 2 with individual sips of nectar thick liquids. No instances of aspiration were noted but pt is at risk for aspiration with thin liquids and with use of consecutive swallows. Considering the appreciated aspiration risk combined with his reports, it is recommended that the current diet of dysphagia 3 solids and nectar thick liquids via cup be continued at this time. SLP will follow for dysphagia treatment. SLP Visit Diagnosis Dysphagia,  pharyngeal phase (R13.13) Attention and concentration deficit following -- Frontal lobe and executive function deficit following -- Impact on safety and function Mild aspiration risk   CHL IP TREATMENT RECOMMENDATION 09/19/2019 Treatment Recommendations Therapy as outlined in treatment plan below   Prognosis 09/19/2019 Prognosis for Safe Diet Advancement Good Barriers to Reach Goals Time post onset Barriers/Prognosis Comment -- CHL IP DIET RECOMMENDATION 09/19/2019 SLP Diet Recommendations Dysphagia 2 (Fine chop) solids;Nectar thick liquid Liquid Administration via Cup;No straw Medication Administration Whole meds with puree Compensations Slow rate;Small sips/bites Postural Changes Seated upright at 90 degrees   CHL IP OTHER RECOMMENDATIONS 09/19/2019 Recommended Consults -- Oral Care Recommendations Oral care BID Other Recommendations Order thickener from pharmacy   CHL IP FOLLOW UP RECOMMENDATIONS 09/19/2019 Follow up Recommendations Home health SLP   CHL IP FREQUENCY AND DURATION 09/19/2019 Speech Therapy Frequency (ACUTE ONLY) min 2x/week Treatment Duration 2 weeks      CHL IP ORAL PHASE 09/19/2019 Oral Phase WFL Oral - Pudding Teaspoon -- Oral - Pudding Cup -- Oral - Honey Teaspoon -- Oral - Honey Cup -- Oral - Nectar Teaspoon -- Oral - Nectar Cup -- Oral - Nectar Straw -- Oral - Thin Teaspoon -- Oral - Thin Cup -- Oral - Thin Straw -- Oral - Puree -- Oral - Mech Soft -- Oral - Regular -- Oral - Multi-Consistency -- Oral - Pill -- Oral Phase - Comment --  CHL IP PHARYNGEAL PHASE 09/19/2019 Pharyngeal Phase Impaired Pharyngeal- Pudding Teaspoon -- Pharyngeal -- Pharyngeal- Pudding Cup -- Pharyngeal -- Pharyngeal- Honey Teaspoon -- Pharyngeal -- Pharyngeal- Honey Cup -- Pharyngeal -- Pharyngeal- Nectar Teaspoon -- Pharyngeal -- Pharyngeal- Nectar Cup -- Pharyngeal -- Pharyngeal- Nectar Straw -- Pharyngeal -- Pharyngeal- Thin Teaspoon -- Pharyngeal -- Pharyngeal- Thin Cup -- Pharyngeal -- Pharyngeal- Thin Straw --  Pharyngeal -- Pharyngeal- Puree -- Pharyngeal -- Pharyngeal- Mechanical Soft -- Pharyngeal -- Pharyngeal- Regular -- Pharyngeal -- Pharyngeal- Multi-consistency -- Pharyngeal -- Pharyngeal- Pill -- Pharyngeal -- Pharyngeal Comment --  Shanika I. Hardin Negus, [place], [place] Office number [phone] Pager [place] 09/19/2019, 3:05 PM               Cardiac Studies   Echo 09/06/19: 1. Left ventricular ejection fraction,  by estimation, is 60 to 65%. The  left ventricle has normal function. The left ventricle has no regional  wall motion abnormalities. There is mild concentric left ventricular  hypertrophy. Left ventricular diastolic  parameters are consistent with Grade II diastolic dysfunction  (pseudonormalization). Elevated left atrial pressure.  2. Right ventricular systolic function is normal. The right ventricular  size is normal. Tricuspid regurgitation signal is inadequate for assessing  PA pressure.  3. Left atrial size was moderately dilated.  4. The mitral valve is normal in structure and function. Trivial mitral  valve regurgitation. No evidence of mitral stenosis.  5. The aortic valve is normal in structure and function. Aortic valve  regurgitation is not visualized. Mild aortic valve sclerosis is present,  with no evidence of aortic valve stenosis.  6. The inferior vena cava is dilated in size with >50% respiratory  variability, suggesting right atrial pressure of 8 mmHg.    Patient Profile     79 y.o. male with a hx of CAD s/p prior CABG [facility] 02/2004), HTN, HLD, pulmonary fibrosis, schizoaffective disorder, CKD IV, anemia of chronic disease, and GERD  who is being seen today for the evaluation of dCHF at the request of Dr. Cyndia Skeeters.  Assessment & Plan    1. CHF: Although he may have some underlying pulmonary fibrosis and may have experienced aspiration pneumonia, the very rapid improvement in his pulmonary infiltrates and  hypoxemia is consistent with heart failure.  His diuretics had been stopped earlier in the month due to orthostatic hypotension.  Echo showed normal left ventricular systolic function, but even at that point showed evidence of elevated right and left heart filling pressures, consistent with decompensated diastolic heart failure. Net diuresis 5.5 L since admission. I think today's weight of 151 pounds is probably a reasonably close estimation of his "dry weight". Would recommend augmenting diuretic therapy if his weight exceeds 154 pounds to avoid readmission. It's very important at the time of discharge to make sure that his group home provides a sodium restricted diet.  Also very important for him to have daily weights which should be kept in a written log. 2. CAD: He has not had recent angina pectoris.  The mild elevation in high-sensitivity troponin is consistent with mild demand ischemia in the setting of heart failure exacerbation.  On statin.  Target LDL cholesterol under 70.  Recommend restarting atenolol 25 mg daily recommend and aspirin 81 mg daily. 3. Long QT: Related to the use of psychotropic drugs.  Avoid other QT prolonging agents. 4. CKD 4: Makes it more difficult to manage his fluid status.  That his creatinine has steadily improved with diuresis during this hospitalization.  Baseline creatinine is probably around 2.6-2.7 corresponding to a GFR of 20-25. 5. Anemia: While there is a component of iron deficiency, it is likely that erythropoietin deficiency is the major driver.  [facility] will sign off.   Medication Recommendations: Aspirin 81 mg daily, atenolol 25 mg daily, atorvastatin 20 mg daily, furosemide 40 mg daily, potassium chloride 40 mEq daily (the dose may need to be reduced once his potassium level is replete). Other recommendations (labs, testing, etc): Daily weight log, strictly enforce a sodium restricted diet.  Call physician for advice regarding diuretic dose  adjustment if weight reaches 154 pounds or higher. Follow up as an outpatient: We will schedule a transition of care visit in 2 weeks.  Labs can be rechecked at that time.  For questions or updates, please contact [place] Please  consult [email or web address] for contact info under        Signed, Sanda Klein, MD  09/21/2019, 8:52 AM

## 2019-09-21 NOTE — Progress Notes (Signed)
  Speech Language Pathology Treatment: Dysphagia  Patient Details Name: Robert Little MRN: [id number] DOB: 1940/10/15 Today's Date: 09/21/2019 Time: 1600-1610 SLP Time Calculation (min) (ACUTE ONLY): 10 min  Assessment / Plan / Recommendation Clinical Impression  Pt was seen for dysphagia treatment. He and nursing reported that he has been tolerating the current diet without difficulty. He consumed puree solids and nectar thick liquids without overt s/sx of aspiration or any significant oral residue. He required min cues for use of individual swallows. Pt currently has discharge orders and will benefit from continued SLP services to improve swallow function.    HPI HPI: Pt is a 79 y.o. male adm to [facility] with respiratory difficulties. Pt PMH + for GERD, schizoaffective d/o, pulmonary fibrosis, HLD, anemia, AKI, sepsis.  Pt desated to 75% during his lunch meal yesterday requiring increase in oxygen to 15 Liters on NRB.  Today he is on 8 Liters.  He is noted to have broken sternal wires per imaging.  Pt with possible developing effusion, pulmonary edema on fibrosis/ILD.  During the initial evaluation the pt reported dysphagia to liquids more than foods causing him to cough. He also states pills will lodge in his throat - pointing to distal region/ ? pyriform sinus.  He denied expectorating food or liquid and stated he can "get it down".   Per recent admit note, group home staff reports pt has periods of time when his intake is poor but pt denies specific reasoning for this issue. Chest x-ray 3/17: Pt is a 79 y.o. male adm to [facility] with respiratory difficulties. Pt PMH + for GERD, schizoaffective d/o, pulmonary fibrosis, HLD, anemia, AKI, sepsis.  Pt desated to 75% during his lunch meal yesterday requiring increase in oxygen to 15 Liters on NRB.  Today he is on 8 Liters.  He is noted to have broken sternal wires per imaging.  Pt with possible developing effusion, pulmonary edema on fibrosis/ILD.  During the initial  evaluation the pt reported dysphagia to liquids more than foods causing him to cough. He also states pills will lodge in his throat - pointing to distal region/ ? pyriform sinus.  He denied expectorating food or liquid and stated he can "get it down".   Per recent admit note, group home staff reports pt has periods of time when his intake is poor but pt denies specific reasoning for this issue. Chest x-ray 3/17 was negative for acute changes.       SLP Plan  Continue with current plan of care       Recommendations  Diet recommendations: Dysphagia 2 (fine chop);Nectar-thick liquid Liquids provided via: Cup;No straw Medication Administration: Whole meds with puree Supervision: Patient able to self feed;Intermittent supervision to cue for compensatory strategies Compensations: Slow rate;Small sips/bites                Oral Care Recommendations: Oral care BID Follow up Recommendations: Home health SLP SLP Visit Diagnosis: Dysphagia, pharyngeal phase (R13.13) Plan: Continue with current plan of care       Roland Prine I. Hardin Negus, [place], [place] Office number [phone] Pager [phone]                 Horton Marshall 09/21/2019, 5:44 PM

## 2019-09-21 NOTE — Progress Notes (Addendum)
SATURATION QUALIFICATIONS: (This note is used to comply with regulatory documentation for home oxygen)  Patient Saturations on Room Air at Rest = 88%  Patient Saturations on Room Air while Ambulating = 75%  Patient Saturations on 8 Liters of oxygen while Ambulating = 96%  Please briefly explain why patient needs home oxygen: Pt needing up to 8L to complete ambulation and multistep daily activities. Pt needs constant supervision and cues to manage O2 and O2 saturations. Pt recovered back to 2L with rest.  Zenovia Jarred, MSOT, OTR/L [facility] Office Number: [phone] Pager: [phone]

## 2019-09-21 NOTE — Progress Notes (Signed)
SLP Cancellation Note  Patient Details Name: Robert Little MRN: [id number] DOB: December 31, 1940   Cancelled treatment:       Reason Eval/Treat Not Completed: Patient at procedure or test/unavailable(Pt ambulating with PT at this time. SLP will follow up. )  Ceniya Fowers I. Hardin Negus, [place], [place] Office number [phone] Pager [place] 09/21/2019, 3:35 PM

## 2019-09-21 NOTE — ED Triage Notes (Signed)
Pt arrives via [facility] c/o hypoxia. Pt discharged earlier today with Rx for home oxygen therapy. Home oxygen concentrator broke, pt here for continuous O2 therapy. 2/L [place] baseline. VS WNL, 98% 2/L. A&Ox4. GCS 15.

## 2019-09-21 NOTE — TOC Transition Note (Signed)
Transition of Care [facility]) - CM/SW Discharge Note   Patient Details  Name: Robert Little MRN: [id number] Date of Birth: 1941-05-26  Transition of Care [facility]) CM/SW Contact:  Zenon Mayo, RN Phone Number: 09/21/2019, 11:32 AM   Clinical Narrative:    Patient is for dc today, he will dc home with home oxygen with [employer] and [facility] for [place].  NCM notified [facility].  Legrand Como with [facility] states since he has covid test on 3/15 he does not need one today, NCM informed MD of this information.  Please call [name] at [name] home when patient is ready.    Final next level of care: [place] Barriers to Discharge: No Barriers Identified   Patient Goals and CMS Choice Patient states their goals for this hospitalization and ongoing recovery are:: get better CMS Medicare.gov Compare Post Acute Care list provided to:: Patient Choice offered to / list presented to : Patient  Discharge Placement                       Discharge Plan and Services   Discharge Planning Services: CM Consult Post Acute Care Choice: Home Health          DME Arranged: Oxygen DME Agency: [employer] Date DME Agency Contacted: 09/20/19 Time DME Agency Contacted: 70 Representative spoke with at DME Agency: Thedore Mins HH Arranged: PT [place]: ([place]) Date [place]: 09/20/19 Time [place]: 1400 Representative spoke with at [place]: [place] ([place]) Interventions     Readmission Risk Interventions Readmission Risk Prevention Plan 09/20/2019  Transportation Screening Complete  PCP or Specialist Appt within 3-5 Days Complete  HRI or [place] Complete  Social Work Consult for [facility] Planning/Counseling Complete  Palliative Care Screening Not Applicable  Medication Review Press photographer) Complete

## 2019-09-21 NOTE — Care Management (Signed)
ED CM received a call from 2W concerning patient's group home calling to report portable oxygen concentrator not working. When CM tried to call patient caregiver patient was already in the ED. CM noted [employer] as the oxygen supplier. CM contacted Betsy with [employer] who is currently working on the trying to deliver a working unit to the group home tonight.  CM spoke with Mr.Blackwell [phone] to inform him that oxygen will be delivered to the ED tonight for discharge and home unit will be delivered tonight the the residence, Carter updated Writer. Patient will return by [facility]

## 2019-09-21 NOTE — Progress Notes (Signed)
Patient IVs discontinued.   Discharge instructions reviewed as well as follow up appointments and medications.  Instructions reviewed with Legrand Como at [facility].  He reports he is not available to pick up patient until 5-530 pm.  Instructions given on where to wait for patient and RN phone number.  Ensured dinner tray ordered for the patient

## 2019-09-21 NOTE — Progress Notes (Signed)
Physical Therapy Treatment Patient Details Name: Robert Little MRN: [id number] DOB: 1940/11/15 Today's Date: 09/21/2019    History of Present Illness 79 y.o. male with medical history significant for chronic anemia, pulmonary fibrosis, hypertension, coronary artery disease, chronic diastolic CHF, and schizoaffective disorder, presenting to the emergency department with progressive shortness of breath long with severe orthopnea starting 1 to 2 days prior to hospital visit, in the hospital work-up suggestive of CHF and acute hypoxic respiratory failure and he was admitted 09/16/19    PT Comments    Patient seen for mobility progression. Pt up in chair upon arrival and eager to participate in therapy. Pt with SOB with mobility but has no c/o. Pt requires up to 8L and standing rest break to recover SpO2  >90% and ultimately needed 6L to maintain ambulating back to room. Upon arrival back to room pt put back on 2L O2 once in sitting and with desat to 85%. SpO2 >90% with several minutes of PLB on 2L. Current plan remains appropriate.    Follow Up Recommendations  Home health PT     Equipment Recommendations  None recommended by PT    Recommendations for Other Services       Precautions / Restrictions Precautions Precautions: Fall;Other (comment) Precaution Comments: watch O2 Restrictions Weight Bearing Restrictions: No    Mobility  Bed Mobility               General bed mobility comments: pt OOB in chair upon arrival  Transfers Overall transfer level: Needs assistance Equipment used: Rolling walker (2 wheeled) Transfers: Sit to/from Stand Sit to Stand: Min guard         General transfer comment: min guard for safety, cues for hand placement   Ambulation/Gait Ambulation/Gait assistance: Min guard Gait Distance (Feet): 250 Feet Assistive device: Rolling walker (2 wheeled) Gait Pattern/deviations: Step-through pattern;Decreased stride length Gait velocity: decreased    General Gait Details: cues for upright posture and PLB; standing rest break required due to SOB and SpO2 desat to 84% on 2L and ultimately needed 8L to recover and then 6L to ambulat back to room    Stairs             Wheelchair Mobility    Modified Rankin (Stroke Patients Only)       Balance Overall balance assessment: Needs assistance Sitting-balance support: No upper extremity supported;Feet supported Sitting balance-Leahy Scale: Good     Standing balance support: Bilateral upper extremity supported;During functional activity Standing balance-Leahy Scale: Fair Standing balance comment: reliant on UE support for dynamic balance                            Cognition Arousal/Alertness: Awake/alert Behavior During Therapy: WFL for tasks assessed/performed Overall Cognitive Status: History of cognitive impairments - at baseline                                 General Comments: lives in group home at baseline and has supervision for all safety      Exercises      General Comments General comments (skin integrity, edema, etc.): once back to room pt back on 2L O2 with desat to 85% and with several minutes recovered to >90% at rest      Pertinent Vitals/Pain Pain Assessment: No/denies pain    Home Living  Prior Function            PT Goals (current goals can now be found in the care plan section) Acute Rehab PT Goals Patient Stated Goal: go home Progress towards PT goals: Progressing toward goals    Frequency    Min 3X/week      PT Plan Current plan remains appropriate    Co-evaluation              AM-PAC PT "6 Clicks" Mobility   Outcome Measure  Help needed turning from your back to your side while in a flat bed without using bedrails?: None Help needed moving from lying on your back to sitting on the side of a flat bed without using bedrails?: A Little Help needed moving to and from a  bed to a chair (including a wheelchair)?: A Little Help needed standing up from a chair using your arms (e.g., wheelchair or bedside chair)?: A Little Help needed to walk in hospital room?: A Little Help needed climbing 3-5 steps with a railing? : A Little 6 Click Score: 19    End of Session Equipment Utilized During Treatment: Gait belt;Oxygen Activity Tolerance: Patient tolerated treatment well Patient left: in chair;with call bell/phone within reach;with chair alarm set Nurse Communication: Mobility status PT Visit Diagnosis: Difficulty in walking, not elsewhere classified (R26.2);Unsteadiness on feet (R26.81)     Time: 5300-5110 PT Time Calculation (min) (ACUTE ONLY): 15 min  Charges:  $Gait Training: 8-22 mins                     Earney Navy, PTA Acute Rehabilitation Services Pager: [phone] Office: [phone]     Darliss Cheney 09/21/2019, 5:05 PM

## 2019-09-21 NOTE — ED Provider Notes (Signed)
[facility] EMERGENCY DEPARTMENT Provider Note   CSN: [id number] Arrival date & time: 09/21/19  2128     History Chief Complaint  Patient presents with  . Possible SW Consult  . Hypoxia    Robert Little is a 79 y.o. male.  Patient with hx chf, pulm fibrosis, presents via EMS, was just d/c'd from hospital on home o2, but when got home his o2 machine was not working. Patient denies any new or different symptoms since leaving hospital. No chest pain or discomfort. No worsening shortness of breath. No cough. No fever. On leg pain or swelling. Symptoms acute onset, worse when off o2, back to baseline when on o2.   The history is provided by the patient and the EMS personnel.       Past Medical History:  Diagnosis Date  . Anemia   . Coronary artery disease   . GERD (gastroesophageal reflux disease)   . Hypertension   . Pulmonary fibrosis ([place])   . Schizo affective schizophrenia [facility])     Patient Active Problem List   Diagnosis Date Noted  . Acute respiratory failure with hypoxia ([place]) 09/17/2019  . Positive D dimer 09/17/2019  . Acute on chronic diastolic CHF (congestive heart failure) ([place]) 09/17/2019  . CHF (congestive heart failure) ([place]) 09/17/2019  . Acute hypoxemic respiratory failure ([place]) 09/17/2019  . Syncope 09/05/2019  . Hypocalcemia 09/05/2019  . Hypomagnesemia 09/05/2019  . Acute urinary retention 05/18/2018  . UTI (urinary tract infection) 05/15/2018  . Sepsis ([place]) 05/15/2018  . Hypokalemia 05/15/2018  . Acute renal failure superimposed on stage 4 chronic kidney disease ([place]) 05/15/2018  . HLD (hyperlipidemia) 05/15/2018  . Coronary artery disease   . Hypertension   . Pulmonary fibrosis ([place])   . Schizo affective schizophrenia ([place])   . Normocytic anemia     Past Surgical History:  Procedure Laterality Date  . CORONARY ARTERY BYPASS GRAFT         Family History  Problem Relation Age of Onset  . Diabetes Mellitus II Mother     . Diabetes Mellitus II Maternal Grandmother   . Heart disease Maternal Grandmother     Social History   Tobacco Use  . Smoking status: Former Smoker    Types: Cigarettes  . Smokeless tobacco: Never Used  Substance Use Topics  . Alcohol use: Not Currently  . Drug use: Not Currently    Home Medications Prior to Admission medications   Medication Sig Start Date End Date Taking? Authorizing Provider  acetaminophen (TYLENOL) 325 MG tablet Take 2 tablets (650 mg total) by mouth every 6 (six) hours as needed for mild pain (or Fever >/= 101). 05/18/18   Debbe Odea, MD  albuterol (VENTOLIN HFA) 108 (90 Base) MCG/ACT inhaler Inhale 2 puffs into the lungs every 6 (six) hours as needed for wheezing or shortness of breath. 09/21/19   Mercy Riding, MD  aspirin EC 81 MG EC tablet Take 1 tablet (81 mg total) by mouth daily. 09/22/19   Mercy Riding, MD  atenolol (TENORMIN) 25 MG tablet Take 25 mg by mouth daily.    [provider]  atorvastatin (LIPITOR) 20 MG tablet Take 20 mg by mouth at bedtime.    [provider]  benztropine (COGENTIN) 0.5 MG tablet Take 0.5 mg by mouth 2 (two) times daily.    [provider]  calcium gluconate 500 MG tablet Take 1 tablet (500 mg total) by mouth 2 (two) times daily. 09/07/19  Patrecia Pour, MD  clonazePAM (KLONOPIN) 0.5 MG tablet Take 0.5 mg by mouth 2 (two) times daily.     [provider]  Dexlansoprazole (DEXILANT) 30 MG capsule Take 30 mg by mouth daily.    [provider]  ferrous sulfate 325 (65 FE) MG tablet Take 1 tablet (325 mg total) by mouth 2 (two) times daily with a meal. 09/21/19   Gonfa, Taye T, MD  fluticasone furoate-vilanterol (BREO ELLIPTA) 200-25 MCG/INH AEPB Inhale 1 puff into the lungs daily. 09/22/19   Mercy Riding, MD  furosemide (LASIX) 40 MG tablet Take 1 tablet (40 mg total) by mouth daily. 09/22/19   Mercy Riding, MD  glipiZIDE (GLUCOTROL) 5 MG tablet Take 1 tablet (5 mg total) by mouth 2  (two) times daily. 09/21/19 01/19/20  Mercy Riding, MD  Multiple Vitamins-Minerals ([employer] SPECTRAVITE PO) Take 1 tablet by mouth daily.    [provider]  polyethylene glycol powder (MIRALAX) 17 GM/SCOOP powder Take 17 g by mouth 2 (two) times daily as needed for moderate constipation. 09/21/19   Mercy Riding, MD  potassium chloride SA (KLOR-CON) 20 MEQ tablet Take 1 tablet (20 mEq total) by mouth daily. 09/22/19   Mercy Riding, MD  senna-docusate (SENOKOT-S) 8.6-50 MG tablet Take 1 tablet by mouth 2 (two) times daily between meals as needed for mild constipation. 09/21/19   Mercy Riding, MD  tamsulosin (FLOMAX) 0.4 MG CAPS capsule Take 1 capsule (0.4 mg total) by mouth daily after supper. Patient taking differently: Take 0.4 mg by mouth daily.  05/18/18   Debbe Odea, MD  umeclidinium bromide (INCRUSE ELLIPTA) 62.5 MCG/INH AEPB Inhale 1 puff into the lungs daily. 09/22/19   Mercy Riding, MD    Allergies    Patient has no known allergies.  Review of Systems   Review of Systems  Constitutional: Negative for fever.  HENT: Negative for sore throat.   Respiratory: Negative for cough.   Cardiovascular: Negative for chest pain, palpitations and leg swelling.  Gastrointestinal: Negative for nausea and vomiting.    Physical Exam Updated Vital Signs BP (!) 127/51   Pulse 66   Temp 97.8 F (36.6 C)   Resp (!) 29   SpO2 94%   Physical Exam Vitals and nursing note reviewed.  Constitutional:      Appearance: Normal appearance. He is well-developed.  HENT:     Head: Atraumatic.     Nose: Nose normal.     Mouth/Throat:     Mouth: Mucous membranes are moist.     Pharynx: Oropharynx is clear.  Eyes:     General: No scleral icterus.    Conjunctiva/sclera: Conjunctivae normal.  Neck:     Trachea: No tracheal deviation.  Cardiovascular:     Rate and Rhythm: Normal rate and regular rhythm.     Pulses: Normal pulses.     Heart sounds: Normal heart sounds. No murmur. No friction  rub. No gallop.   Pulmonary:     Effort: Pulmonary effort is normal. No accessory muscle usage or respiratory distress.     Breath sounds: Normal breath sounds.  Abdominal:     General: There is no distension.     Tenderness: There is no abdominal tenderness.  Genitourinary:    Comments: No cva tenderness. Musculoskeletal:        General: No swelling or tenderness.     Cervical back: Normal range of motion and neck supple. No rigidity.  Right lower leg: No edema.     Left lower leg: No edema.  Skin:    General: Skin is warm and dry.     Findings: No rash.  Neurological:     Mental Status: He is alert.     Comments: Alert, speech clear.   Psychiatric:        Mood and Affect: Mood normal.     ED Results / Procedures / Treatments   Labs (all labs ordered are listed, but only abnormal results are displayed) Labs Reviewed - No data to display  EKG None  Radiology No results found.  Procedures Procedures (including critical care time)  Medications Ordered in ED Medications - No data to display  ED Course  I have reviewed the triage vital signs and the nursing notes.  Pertinent labs & imaging results that were available during my care of the patient were reviewed by me and considered in my medical decision making (see chart for details).    MDM Rules/Calculators/A&P                      Patient denies acute or worsening symptoms, but rather indicates only needs home o2.   TOC team consulted - [name] indicates home health will bring o2/o2 machine to ED to go home with patient.   Reviewed nursing notes and prior charts for additional history.   Patient is breathing comfortably, o2 sats 95% on 2 liters. Pt appears stable for d/c back to home with working home o2.     Final Clinical Impression(s) / ED Diagnoses Final diagnoses:  None    Rx / DC Orders ED Discharge Orders    None       Lajean Saver, MD 09/21/19 2225

## 2019-09-21 NOTE — Care Management Important Message (Signed)
Important Message  Patient Details  Name: Robert Little MRN: [id number] Date of Birth: 09/28/1940   Medicare Important Message Given:  Yes     Shelda Altes 09/21/2019, 1:14 PM

## 2019-09-21 NOTE — Discharge Instructions (Addendum)
It was our pleasure to provide your ER care today - we hope that you feel better.  Uses home oxygen as instructed.  Return to ER if worse, new symptoms, fevers, chest pain, increased trouble breathing, or other concern.

## 2019-09-22 NOTE — ED Notes (Signed)
Attempted to call Mr. Robert Little x3 without answer in order to see if the oxygen had been delivered to the group home yet. [facility] canceled at this point due to not knowing if new O2 tank is at the residence yet. Spoke with rep from home health care agency who said it would be within 2 hours, but does not have an exact time when the technician will arrive to the group home with the new oxygen tank

## 2019-09-22 NOTE — ED Notes (Addendum)
Had pt's caregiver call back and stated that he did indeed have a transport O2 tank that he had forgotten about, and that he could transport the patient home instead of PTAR. Caregiver arrived to main entrance and gave SORT tech the O2, brought back to pt's room, and this RN then placed pt on the O2. Pt was then rolled back out to main entrance by tech to be transported home by [name]. Discharge instructions were reviewed with [name] over the phone about using the home O2

## 2019-09-22 NOTE — ED Notes (Signed)
PTAR called for pt again due to caregiver calling back stated the oxygen had been delivered.

## 2019-09-24 NOTE — TOC Progression Note (Signed)
Transition of Care [facility]) - Progression Note    Patient Details  Name: Robert Little MRN: [id number] Date of Birth: 03-21-1941  Transition of Care [facility]) CM/SW Contact  Zenon Mayo, RN Phone Number: 09/24/2019, 4:57 PM  Clinical Narrative:    NCM received call from Legrand Como at [facility] that patient should not be a DNR /DNI code he should be Full Code.  Michael at the group home is patient representative.   Expected Discharge Plan: [place] Barriers to Discharge: No Barriers Identified  Expected Discharge Plan and Services Expected Discharge Plan: [place]   Discharge Planning Services: CM Consult Post Acute Care Choice: [place] arrangements for the past 2 months: Group Home([facility]) Expected Discharge Date: 09/21/19               DME Arranged: Oxygen DME Agency: [employer] Date DME Agency Contacted: 09/20/19 Time DME Agency Contacted: 58 Representative spoke with at DME Agency: Thedore Mins HH Arranged: PT [place]: ([place]) Date [place]: 09/20/19 Time [place]: 1400 Representative spoke with at [place]: [place] ([place]) Interventions    Readmission Risk Interventions Readmission Risk Prevention Plan 09/20/2019  Transportation Screening Complete  PCP or Specialist Appt within 3-5 Days Complete  HRI or [place] Complete  Social Work Consult for [place] Planning/Counseling Complete  Palliative Care Screening Not Applicable  Medication Review Press photographer) Complete

## 2019-09-26 ENCOUNTER — Encounter: Payer: Self-pay | Admitting: Internal Medicine

## 2019-10-10 ENCOUNTER — Other Ambulatory Visit: Payer: Self-pay

## 2019-10-10 ENCOUNTER — Encounter (INDEPENDENT_AMBULATORY_CARE_PROVIDER_SITE_OTHER): Payer: Self-pay

## 2019-10-10 ENCOUNTER — Ambulatory Visit (INDEPENDENT_AMBULATORY_CARE_PROVIDER_SITE_OTHER): Payer: Medicare Other | Admitting: Physician Assistant

## 2019-10-10 ENCOUNTER — Encounter: Payer: Self-pay | Admitting: Physician Assistant

## 2019-10-10 VITALS — BP 90/52 | HR 100 | Ht 68.0 in | Wt 153.0 lb

## 2019-10-10 DIAGNOSIS — N179 Acute kidney failure, unspecified: Secondary | ICD-10-CM | POA: Diagnosis not present

## 2019-10-10 DIAGNOSIS — E785 Hyperlipidemia, unspecified: Secondary | ICD-10-CM

## 2019-10-10 DIAGNOSIS — N184 Chronic kidney disease, stage 4 (severe): Secondary | ICD-10-CM

## 2019-10-10 DIAGNOSIS — I2510 Atherosclerotic heart disease of native coronary artery without angina pectoris: Secondary | ICD-10-CM | POA: Diagnosis not present

## 2019-10-10 DIAGNOSIS — I5033 Acute on chronic diastolic (congestive) heart failure: Secondary | ICD-10-CM | POA: Diagnosis not present

## 2019-10-10 LAB — BASIC METABOLIC PANEL
BUN/Creatinine Ratio: 17 (ref 10–24)
BUN: 37 mg/dL — ABNORMAL HIGH (ref 8–27)
CO2: 19 mmol/L — ABNORMAL LOW (ref 20–29)
Calcium: 8.7 mg/dL (ref 8.6–10.2)
Chloride: 97 mmol/L (ref 96–106)
Creatinine, Ser: 2.15 mg/dL — ABNORMAL HIGH (ref 0.76–1.27)
GFR calc Af Amer: 33 mL/min/{1.73_m2} — ABNORMAL LOW (ref >59)
GFR calc non Af Amer: 29 mL/min/{1.73_m2} — ABNORMAL LOW (ref >59)
Glucose: 180 mg/dL — ABNORMAL HIGH (ref 65–99)
Potassium: 4.4 mmol/L (ref 3.5–5.2)
Sodium: 133 mmol/L — ABNORMAL LOW (ref 134–144)

## 2019-10-10 NOTE — Patient Instructions (Addendum)
Medication Instructions:   HOLD Furosemide (Lasix) tomorrow, then restart.  *If you need a refill on your cardiac medications before your next appointment, please call your pharmacy*   Lab Work: Your physician recommends that you return for lab work today: BMET  If you have labs (blood work) drawn today and your tests are completely normal, you will receive your results only by: Marland Kitchen MyChart Message (if you have MyChart) OR . A paper copy in the mail If you have any lab test that is abnormal or we need to change your treatment, we will call you to review the results.   Follow-Up: At [facility], you and your health needs are our priority.  As part of our continuing mission to provide you with exceptional heart care, we have created designated Provider Care Teams.  These Care Teams include your primary Cardiologist (physician) and Advanced Practice Providers (APPs -  Physician Assistants and Nurse Practitioners) who all work together to provide you with the care you need, when you need it.  We recommend signing up for the patient portal called "MyChart".  Sign up information is provided on this After Visit Summary.  MyChart is used to connect with patients for Virtual Visits (Telemedicine).  Patients are able to view lab/test results, encounter notes, upcoming appointments, etc.  Non-urgent messages can be sent to your provider as well.   To learn more about what you can do with MyChart, go to [email or web address].    Your next appointment:   1 month(s)  The format for your next appointment:   In Person  Provider:   Sanda Klein, MD   Other Instructions  WEIGH DAILY, every morning, wearing the same amount of clothing Record weights, contact Sanda Klein, MD for weight gain of 3 lbs in a day or 5 lbs in a week Limit sodium to 500 mg per meal, total 2000 mg per day Limit all liquids to 1.5-2 liters/quarts per day

## 2019-10-10 NOTE — Progress Notes (Signed)
Cardiology Office Note   Date:  10/10/2019   ID:  Robert Little, DOB June 20, 1941, MRN [id number]  PCP:  [name] Cardiologist:  Sanda Klein, MD 09/21/2019 in-hospital Electrphysiologist: None Rosaria Ferries, PA-C   No chief complaint on file.   History of Present Illness: Robert Little is a 79 y.o. male with a history of CAD s/p prior CABG([facility] 02/2004), HTN, HLD, pulmonary fibrosis, schizoaffective disorder in a group home, CKD IV, anemia of chronic disease, and GERD.  Admitted 03/14-03/19/2021 with acute hypoxic resp failure 2nd D-CHF, wt at d/c 151 lbs. Robert Little, caregiver, is with him.   Robert Little presents for cardiology follow up.   He has been doing fairly well since leaving the hospital.   He is walking some, the longest he has walked in a while was coming here today, he is tired from that, had some SOB, but no CP.   His caregiver is working very hard to make sure he stays on a low-sodium diet, is compliant with his medications, and is getting daily weights.  He has not had lower extremity edema.  His dyspnea on exertion is at baseline.  He is still on continuous oxygen.  Robert Little thinks he has had some chest pain, but cannot tell me more about it.  He is not having it now.  He has not had orthopnea and do not believe he has had PND.  He has not had any chest pain.  He denies being lightheaded or dizzy.  He is eating pretty well, drinking a reasonable amount of liquids.   Past Medical History:  Diagnosis Date  . Anemia   . Coronary artery disease   . GERD (gastroesophageal reflux disease)   . Hypertension   . Pulmonary fibrosis ([place])   . Schizo affective schizophrenia [facility])     Past Surgical History:  Procedure Laterality Date  . CORONARY ARTERY BYPASS GRAFT      Current Outpatient Medications  Medication Sig Dispense Refill  . acetaminophen (TYLENOL) 325 MG tablet Take 2 tablets (650 mg total) by mouth every 6  (six) hours as needed for mild pain (or Fever >/= 101).    Marland Kitchen albuterol (VENTOLIN HFA) 108 (90 Base) MCG/ACT inhaler Inhale 2 puffs into the lungs every 6 (six) hours as needed for wheezing or shortness of breath. 18 g 1  . aspirin EC 81 MG EC tablet Take 1 tablet (81 mg total) by mouth daily. 90 tablet 1  . atenolol (TENORMIN) 25 MG tablet Take 25 mg by mouth daily.    Marland Kitchen atorvastatin (LIPITOR) 20 MG tablet Take 20 mg by mouth at bedtime.    . benztropine (COGENTIN) 0.5 MG tablet Take 0.5 mg by mouth 2 (two) times daily.    . calcium gluconate 500 MG tablet Take 1 tablet (500 mg total) by mouth 2 (two) times daily. 60 tablet 0  . clonazePAM (KLONOPIN) 0.5 MG tablet Take 0.5 mg by mouth 2 (two) times daily.     Marland Kitchen Dexlansoprazole (DEXILANT) 30 MG capsule Take 30 mg by mouth daily.    . ferrous sulfate 325 (65 FE) MG tablet Take 1 tablet (325 mg total) by mouth 2 (two) times daily with a meal. 180 tablet 1  . fluticasone furoate-vilanterol (BREO ELLIPTA) 200-25 MCG/INH AEPB Inhale 1 puff into the lungs daily. 1 each 1  . furosemide (LASIX) 40 MG tablet Take 1 tablet (40 mg total) by mouth daily. 90 tablet 1  . glipiZIDE (  GLUCOTROL) 5 MG tablet Take 1 tablet (5 mg total) by mouth 2 (two) times daily. 120 tablet 1  . Multiple Vitamins-Minerals ([employer] SPECTRAVITE PO) Take 1 tablet by mouth daily.    . polyethylene glycol powder (MIRALAX) 17 GM/SCOOP powder Take 17 g by mouth 2 (two) times daily as needed for moderate constipation. 255 g 0  . potassium chloride SA (KLOR-CON) 20 MEQ tablet Take 1 tablet (20 mEq total) by mouth daily. 90 tablet 1  . senna-docusate (SENOKOT-S) 8.6-50 MG tablet Take 1 tablet by mouth 2 (two) times daily between meals as needed for mild constipation. 60 tablet 11  . tamsulosin (FLOMAX) 0.4 MG CAPS capsule Take 1 capsule (0.4 mg total) by mouth daily after supper. (Patient taking differently: Take 0.4 mg by mouth daily. ) 30 capsule 0  . umeclidinium bromide (INCRUSE ELLIPTA) 62.5  MCG/INH AEPB Inhale 1 puff into the lungs daily. 1 each 1   No current facility-administered medications for this visit.    Allergies:   Patient has no known allergies.    Social History:  The patient  reports that he has quit smoking. His smoking use included cigarettes. He has never used smokeless tobacco. He reports previous alcohol use. He reports previous drug use.   Family History:  The patient's family history includes Diabetes Mellitus II in his maternal grandmother and mother; Heart disease in his maternal grandmother.  He indicated that the status of his mother is unknown. He indicated that the status of his maternal grandmother is unknown.   ROS:  Please see the history of present illness. All other systems are reviewed and negative.    PHYSICAL EXAM: VS:  BP (!) 90/52   Pulse 100   Ht 5\' 8"  (1.727 m)   Wt 153 lb (69.4 kg)   BMI 23.26 kg/m  , BMI Body mass index is 23.26 kg/m. GEN: Well nourished, well developed, male in no acute distress HEENT: normal for age  Neck: Minimal JVD, no carotid bruit, no masses Cardiac: RRR; no murmur, no rubs, or gallops Respiratory: Scattered dry rales bilaterally, good air exchange, normal work of breathing GI: soft, nontender, nondistended, + BS MS: no deformity or atrophy; no edema; distal pulses are 2+ in all 4 extremities  Skin: warm and dry, no rash Neuro:  Strength and sensation are intact Psych: euthymic mood, full affect   EKG:  EKG is not ordered today.  ECHO:  09/06/19: 1. Left ventricular ejection fraction, by estimation, is 60 to 65%. The  left ventricle has normal function. The left ventricle has no regional  wall motion abnormalities. There is mild concentric left ventricular  hypertrophy. Left ventricular diastolic  parameters are consistent with Grade II diastolic dysfunction  (pseudonormalization). Elevated left atrial pressure.  2. Right ventricular systolic function is normal. The right ventricular  size is  normal. Tricuspid regurgitation signal is inadequate for assessing  PA pressure.  3. Left atrial size was moderately dilated.  4. The mitral valve is normal in structure and function. Trivial mitral  valve regurgitation. No evidence of mitral stenosis.  5. The aortic valve is normal in structure and function. Aortic valve  regurgitation is not visualized. Mild aortic valve sclerosis is present,  with no evidence of aortic valve stenosis.  6. The inferior vena cava is dilated in size with >50% respiratory  variability, suggesting right atrial pressure of 8 mmHg.    Recent Labs: 09/05/2019: TSH 4.328 09/16/2019: ALT 48 09/20/2019: B Natriuretic Peptide 1,053.7 09/21/2019: BUN 68;  Creatinine, Ser 2.85; Hemoglobin 8.6; Magnesium 1.6; Platelets 333; Potassium 3.4; Sodium 138  CBC    Component Value Date/Time   WBC 12.0 (H) 09/21/2019 0200   RBC 2.97 (L) 09/21/2019 0200   HGB 8.6 (L) 09/21/2019 0200   HCT 27.3 (L) 09/21/2019 0200   PLT 333 09/21/2019 0200   MCV 91.9 09/21/2019 0200   MCH 29.0 09/21/2019 0200   MCHC 31.5 09/21/2019 0200   RDW 14.1 09/21/2019 0200   LYMPHSABS 0.7 09/16/2019 2244   MONOABS 0.6 09/16/2019 2244   EOSABS 0.0 09/16/2019 2244   BASOSABS 0.0 09/16/2019 2244   CMP Latest Ref Rng & Units 09/21/2019 09/20/2019 09/19/2019  Glucose 70 - 99 mg/dL 188(H) 329(H) 287(H)  BUN 8 - 23 mg/dL 68(H) 73(H) 68(H)  Creatinine 0.61 - 1.24 mg/dL 2.85(H) 3.29(H) 3.46(H)  Sodium 135 - 145 mmol/L 138 136 137  Potassium 3.5 - 5.1 mmol/L 3.4(L) 4.3 4.0  Chloride 98 - 111 mmol/L 100 97(L) 98  CO2 22 - 32 mmol/L 25 23 23   Calcium 8.9 - 10.3 mg/dL 7.7(L) 6.9(L) 7.0(L)  Total Protein 6.5 - 8.1 g/dL - - -  Total Bilirubin 0.3 - 1.2 mg/dL - - -  Alkaline Phos 38 - 126 U/L - - -  AST 15 - 41 U/L - - -  ALT 0 - 44 U/L - - -     Lipid Panel Lab Results  Component Value Date   CHOL 85 09/21/2019   HDL 33 (L) 09/21/2019   LDLCALC 41 09/21/2019   TRIG 56 09/21/2019   CHOLHDL 2.6  09/21/2019      Wt Readings from Last 3 Encounters:  10/10/19 153 lb (69.4 kg)  09/21/19 151 lb 7.3 oz (68.7 kg)  09/07/19 166 lb 14.2 oz (75.7 kg)     Other studies Reviewed: Additional studies/ records that were reviewed today include: Office notes, hospital records and testing.  ASSESSMENT AND PLAN:  1.  Chronic diastolic CHF: -Robert Little has no significant volume overload by exam -His weight is up 2 pounds from discharge, but that is easily accounted for by clothes and shoes -His heart rate was elevated when his vital signs were checked, but he had just walked a longer distance than he usually does and was tired. -However, his blood pressure is also down. -Check a BMET today, he has already had Lasix today so hold tomorrow. -If his renal function is abnormal, continue to hold the Lasix for another few days and then restart at 20 mg daily with an extra tablet as needed for weight gain -Continue low-sodium diet, daily weights.  Do not think he is over drinking. -He is currently on potassium supplementation with the Lasix.  There is a potential problem with the potassium staying in stomach too long because of the Cogentin, watch this carefully.  2.  Hypotension: -Pressure in the hospital was in the 1 teens-130s -He takes atenolol 25 mg daily and Lasix 40 mg daily for his blood pressure.  He also takes potassium. -Because of his elevated heart rate, I do not wish to decrease the atenolol which is already at a low dose. -Follow-up on the BMET, suspect dehydration  3.  Hypertension: -He is on atenolol and Lasix, follow-up on BMET to help with management  4.  CAD: -His lipid profile is excellent with an LDL of 41, checked recently. -He is having no ischemic symptoms. -Continue baby aspirin, statin, beta-blocker  Current medicines are reviewed at length with the patient today.  The patient does not have concerns regarding medicines.  The following changes have been made: Hold  Lasix tomorrow, more depending on BMET results  Labs/ tests ordered today include:   Orders Placed This Encounter  Procedures  . Basic metabolic panel     Disposition:   FU with Sanda Klein, MD  Signed, Rosaria Ferries, PA-C  10/10/2019 12:09 PM    [place] Phone: [phone]; Fax: [phone]

## 2019-10-11 NOTE — Progress Notes (Signed)
Thanks

## 2019-10-12 ENCOUNTER — Other Ambulatory Visit: Payer: Self-pay

## 2019-10-28 ENCOUNTER — Encounter (HOSPITAL_COMMUNITY): Payer: Self-pay | Admitting: Emergency Medicine

## 2019-10-28 ENCOUNTER — Emergency Department (HOSPITAL_COMMUNITY): Payer: Medicare Other

## 2019-10-28 ENCOUNTER — Observation Stay (HOSPITAL_COMMUNITY)
Admission: EM | Admit: 2019-10-28 | Discharge: 2019-10-29 | Disposition: A | Discharge status: Home / Self Care | Payer: Medicare Other | Attending: Internal Medicine | Admitting: Internal Medicine

## 2019-10-28 DIAGNOSIS — Z8249 Family history of ischemic heart disease and other diseases of the circulatory system: Secondary | ICD-10-CM | POA: Insufficient documentation

## 2019-10-28 DIAGNOSIS — I2510 Atherosclerotic heart disease of native coronary artery without angina pectoris: Secondary | ICD-10-CM | POA: Diagnosis present

## 2019-10-28 DIAGNOSIS — E11649 Type 2 diabetes mellitus with hypoglycemia without coma: Secondary | ICD-10-CM | POA: Diagnosis not present

## 2019-10-28 DIAGNOSIS — Z833 Family history of diabetes mellitus: Secondary | ICD-10-CM | POA: Diagnosis not present

## 2019-10-28 DIAGNOSIS — E878 Other disorders of electrolyte and fluid balance, not elsewhere classified: Secondary | ICD-10-CM | POA: Diagnosis not present

## 2019-10-28 DIAGNOSIS — Z20822 Contact with and (suspected) exposure to covid-19: Secondary | ICD-10-CM | POA: Insufficient documentation

## 2019-10-28 DIAGNOSIS — J8410 Pulmonary fibrosis, unspecified: Secondary | ICD-10-CM | POA: Diagnosis present

## 2019-10-28 DIAGNOSIS — Z951 Presence of aortocoronary bypass graft: Secondary | ICD-10-CM | POA: Diagnosis not present

## 2019-10-28 DIAGNOSIS — E1122 Type 2 diabetes mellitus with diabetic chronic kidney disease: Secondary | ICD-10-CM | POA: Insufficient documentation

## 2019-10-28 DIAGNOSIS — D631 Anemia in chronic kidney disease: Secondary | ICD-10-CM | POA: Diagnosis not present

## 2019-10-28 DIAGNOSIS — Z87891 Personal history of nicotine dependence: Secondary | ICD-10-CM | POA: Insufficient documentation

## 2019-10-28 DIAGNOSIS — N184 Chronic kidney disease, stage 4 (severe): Secondary | ICD-10-CM

## 2019-10-28 DIAGNOSIS — W07XXXA Fall from chair, initial encounter: Secondary | ICD-10-CM | POA: Insufficient documentation

## 2019-10-28 DIAGNOSIS — F319 Bipolar disorder, unspecified: Secondary | ICD-10-CM | POA: Insufficient documentation

## 2019-10-28 DIAGNOSIS — E871 Hypo-osmolality and hyponatremia: Secondary | ICD-10-CM | POA: Diagnosis not present

## 2019-10-28 DIAGNOSIS — Z7951 Long term (current) use of inhaled steroids: Secondary | ICD-10-CM | POA: Insufficient documentation

## 2019-10-28 DIAGNOSIS — I130 Hypertensive heart and chronic kidney disease with heart failure and stage 1 through stage 4 chronic kidney disease, or unspecified chronic kidney disease: Secondary | ICD-10-CM | POA: Insufficient documentation

## 2019-10-28 DIAGNOSIS — Z7982 Long term (current) use of aspirin: Secondary | ICD-10-CM | POA: Diagnosis not present

## 2019-10-28 DIAGNOSIS — I5032 Chronic diastolic (congestive) heart failure: Secondary | ICD-10-CM

## 2019-10-28 DIAGNOSIS — R4182 Altered mental status, unspecified: Secondary | ICD-10-CM | POA: Diagnosis not present

## 2019-10-28 DIAGNOSIS — D649 Anemia, unspecified: Secondary | ICD-10-CM | POA: Diagnosis present

## 2019-10-28 DIAGNOSIS — E785 Hyperlipidemia, unspecified: Secondary | ICD-10-CM | POA: Insufficient documentation

## 2019-10-28 DIAGNOSIS — K219 Gastro-esophageal reflux disease without esophagitis: Secondary | ICD-10-CM | POA: Diagnosis not present

## 2019-10-28 DIAGNOSIS — E162 Hypoglycemia, unspecified: Secondary | ICD-10-CM | POA: Diagnosis present

## 2019-10-28 DIAGNOSIS — F259 Schizoaffective disorder, unspecified: Secondary | ICD-10-CM | POA: Diagnosis not present

## 2019-10-28 DIAGNOSIS — Z79899 Other long term (current) drug therapy: Secondary | ICD-10-CM | POA: Diagnosis not present

## 2019-10-28 DIAGNOSIS — W19XXXA Unspecified fall, initial encounter: Secondary | ICD-10-CM

## 2019-10-28 LAB — CBC WITH DIFFERENTIAL/PLATELET
Abs Immature Granulocytes: 0.18 10*3/uL — ABNORMAL HIGH (ref 0.00–0.07)
Basophils Absolute: 0 10*3/uL (ref 0.0–0.1)
Basophils Relative: 0 %
Eosinophils Absolute: 0.1 10*3/uL (ref 0.0–0.5)
Eosinophils Relative: 1 %
HCT: 28.8 % — ABNORMAL LOW (ref 39.0–52.0)
Hemoglobin: 9.7 g/dL — ABNORMAL LOW (ref 13.0–17.0)
Immature Granulocytes: 2 %
Lymphocytes Relative: 13 %
Lymphs Abs: 1.4 10*3/uL (ref 0.7–4.0)
MCH: 29.7 pg (ref 26.0–34.0)
MCHC: 33.7 g/dL (ref 30.0–36.0)
MCV: 88.1 fL (ref 80.0–100.0)
Monocytes Absolute: 0.9 10*3/uL (ref 0.1–1.0)
Monocytes Relative: 8 %
Neutro Abs: 8.5 10*3/uL — ABNORMAL HIGH (ref 1.7–7.7)
Neutrophils Relative %: 76 %
Platelets: 236 10*3/uL (ref 150–400)
RBC: 3.27 MIL/uL — ABNORMAL LOW (ref 4.22–5.81)
RDW: 14.6 % (ref 11.5–15.5)
WBC: 11 10*3/uL — ABNORMAL HIGH (ref 4.0–10.5)
nRBC: 0 % (ref 0.0–0.2)

## 2019-10-28 LAB — COMPREHENSIVE METABOLIC PANEL
ALT: 12 U/L (ref 0–44)
AST: 21 U/L (ref 15–41)
Albumin: 3.6 g/dL (ref 3.5–5.0)
Alkaline Phosphatase: 80 U/L (ref 38–126)
Anion gap: 15 (ref 5–15)
BUN: 43 mg/dL — ABNORMAL HIGH (ref 8–23)
CO2: 22 mmol/L (ref 22–32)
Calcium: 8.2 mg/dL — ABNORMAL LOW (ref 8.9–10.3)
Chloride: 87 mmol/L — ABNORMAL LOW (ref 98–111)
Creatinine, Ser: 2.13 mg/dL — ABNORMAL HIGH (ref 0.61–1.24)
GFR calc Af Amer: 34 mL/min — ABNORMAL LOW (ref >60)
GFR calc non Af Amer: 29 mL/min — ABNORMAL LOW (ref >60)
Glucose, Bld: 97 mg/dL (ref 70–99)
Potassium: 3.7 mmol/L (ref 3.5–5.1)
Sodium: 124 mmol/L — ABNORMAL LOW (ref 135–145)
Total Bilirubin: 0.9 mg/dL (ref 0.3–1.2)
Total Protein: 6.4 g/dL — ABNORMAL LOW (ref 6.5–8.1)

## 2019-10-28 LAB — CBG MONITORING, ED
Glucose-Capillary: 114 mg/dL — ABNORMAL HIGH (ref 70–99)
Glucose-Capillary: 145 mg/dL — ABNORMAL HIGH (ref 70–99)
Glucose-Capillary: 21 mg/dL — CL (ref 70–99)
Glucose-Capillary: 54 mg/dL — ABNORMAL LOW (ref 70–99)
Glucose-Capillary: 86 mg/dL (ref 70–99)

## 2019-10-28 LAB — PROTEIN / CREATININE RATIO, URINE
Creatinine, Urine: 19.03 mg/dL
Total Protein, Urine: <6 mg/dL

## 2019-10-28 LAB — SODIUM, URINE, RANDOM: Sodium, Ur: 41 mmol/L

## 2019-10-28 LAB — RESPIRATORY PANEL BY RT PCR (FLU A&B, COVID)
Influenza A by PCR: NEGATIVE
Influenza B by PCR: NEGATIVE
SARS Coronavirus 2 by RT PCR: NEGATIVE

## 2019-10-28 LAB — URINALYSIS, ROUTINE W REFLEX MICROSCOPIC
Bilirubin Urine: NEGATIVE
Glucose, UA: NEGATIVE mg/dL
Hgb urine dipstick: NEGATIVE
Ketones, ur: NEGATIVE mg/dL
Leukocytes,Ua: NEGATIVE
Nitrite: NEGATIVE
Protein, ur: NEGATIVE mg/dL
Specific Gravity, Urine: 1.003 — ABNORMAL LOW (ref 1.005–1.030)
pH: 6 (ref 5.0–8.0)

## 2019-10-28 LAB — OSMOLALITY, URINE: Osmolality, Ur: 177 mOsm/kg — ABNORMAL LOW (ref 300–900)

## 2019-10-28 LAB — ETHANOL: Alcohol, Ethyl (B): <10 mg/dL (ref <10)

## 2019-10-28 LAB — CREATININE, URINE, RANDOM: Creatinine, Urine: 18.63 mg/dL

## 2019-10-28 LAB — ACETAMINOPHEN LEVEL: Acetaminophen (Tylenol), Serum: <10 ug/mL — ABNORMAL LOW (ref 10–30)

## 2019-10-28 MED ORDER — POTASSIUM CHLORIDE CRYS ER 20 MEQ PO TBCR
20.0000 meq | EXTENDED_RELEASE_TABLET | Freq: Every day | ORAL | Status: DC
  Administered 2019-10-29: 20 meq via ORAL
  Filled 2019-10-28: qty 1

## 2019-10-28 MED ORDER — DEXTROSE 10 % IV SOLN: INTRAVENOUS | Status: DC

## 2019-10-28 MED ORDER — TAMSULOSIN HCL 0.4 MG PO CAPS
0.4000 mg | ORAL_CAPSULE | Freq: Every day | ORAL | Status: DC
  Administered 2019-10-29: 0.4 mg via ORAL
  Filled 2019-10-28: qty 1

## 2019-10-28 MED ORDER — BENZTROPINE MESYLATE 1 MG PO TABS
0.5000 mg | ORAL_TABLET | Freq: Two times a day (BID) | ORAL | Status: DC
  Administered 2019-10-29: 0.5 mg via ORAL
  Filled 2019-10-28 (×2): qty 1

## 2019-10-28 MED ORDER — ENOXAPARIN SODIUM 30 MG/0.3ML ~~LOC~~ SOLN
30.0000 mg | SUBCUTANEOUS | Status: DC
  Administered 2019-10-29: 30 mg via SUBCUTANEOUS
  Filled 2019-10-28: qty 0.3

## 2019-10-28 MED ORDER — FLUTICASONE FUROATE-VILANTEROL 200-25 MCG/INH IN AEPB
1.0000 | INHALATION_SPRAY | Freq: Every day | RESPIRATORY_TRACT | Status: DC
  Filled 2019-10-28: qty 28

## 2019-10-28 MED ORDER — FUROSEMIDE 40 MG PO TABS
40.0000 mg | ORAL_TABLET | Freq: Every day | ORAL | Status: DC
  Administered 2019-10-29: 40 mg via ORAL
  Filled 2019-10-28: qty 2

## 2019-10-28 MED ORDER — PANTOPRAZOLE SODIUM 40 MG PO TBEC
40.0000 mg | DELAYED_RELEASE_TABLET | Freq: Every day | ORAL | Status: DC
  Administered 2019-10-29: 40 mg via ORAL
  Filled 2019-10-28: qty 1

## 2019-10-28 MED ORDER — ALBUTEROL SULFATE (2.5 MG/3ML) 0.083% IN NEBU
3.0000 mL | INHALATION_SOLUTION | Freq: Every day | RESPIRATORY_TRACT | Status: DC
  Administered 2019-10-29: 3 mL via RESPIRATORY_TRACT
  Filled 2019-10-28: qty 3

## 2019-10-28 MED ORDER — ATENOLOL 50 MG PO TABS
25.0000 mg | ORAL_TABLET | Freq: Every day | ORAL | Status: DC
  Administered 2019-10-29: 25 mg via ORAL
  Filled 2019-10-28: qty 1

## 2019-10-28 MED ORDER — CLONAZEPAM 0.5 MG PO TABS
0.5000 mg | ORAL_TABLET | Freq: Two times a day (BID) | ORAL | Status: DC
  Administered 2019-10-29: 0.5 mg via ORAL
  Filled 2019-10-28 (×2): qty 1

## 2019-10-28 MED ORDER — ATORVASTATIN CALCIUM 10 MG PO TABS
20.0000 mg | ORAL_TABLET | Freq: Every day | ORAL | Status: DC
  Filled 2019-10-28: qty 2

## 2019-10-28 MED ORDER — LACTATED RINGERS IV BOLUS: 1000.0000 mL | Freq: Once | INTRAVENOUS | Status: DC

## 2019-10-28 MED ORDER — VITAMIN B-12 1000 MCG PO TABS
500.0000 ug | ORAL_TABLET | Freq: Two times a day (BID) | ORAL | Status: DC
  Administered 2019-10-29: 500 ug via ORAL
  Filled 2019-10-28 (×2): qty 1

## 2019-10-28 MED ORDER — FERROUS SULFATE 325 (65 FE) MG PO TABS
325.0000 mg | ORAL_TABLET | Freq: Two times a day (BID) | ORAL | Status: DC
  Administered 2019-10-29 (×2): 325 mg via ORAL
  Filled 2019-10-28 (×2): qty 1

## 2019-10-28 MED ORDER — UMECLIDINIUM BROMIDE 62.5 MCG/INH IN AEPB
1.0000 | INHALATION_SPRAY | Freq: Every day | RESPIRATORY_TRACT | Status: DC
  Filled 2019-10-28: qty 7

## 2019-10-28 MED ORDER — ASPIRIN EC 81 MG PO TBEC
81.0000 mg | DELAYED_RELEASE_TABLET | Freq: Every day | ORAL | Status: DC
  Administered 2019-10-29: 81 mg via ORAL
  Filled 2019-10-28: qty 1

## 2019-10-28 MED ORDER — DEXTROSE 50 % IV SOLN: 50.0000 mL | Freq: Once | INTRAVENOUS | Status: AC

## 2019-10-28 MED ORDER — DEXTROSE 50 % IV SOLN
INTRAVENOUS | Status: AC
  Administered 2019-10-28: 50 mL via INTRAVENOUS
  Filled 2019-10-28: qty 50

## 2019-10-28 NOTE — ED Triage Notes (Signed)
Pt. Presents from [facility] (ALF), via [place], after unwitnessed fall. As per ems, patient caretaker found him on floor, weak and confused to time and place, glucose level was 45, was given 12g of D10 IVP by [facility]. Not taking blood thinner, no L.O.C. as per caretaker. Hx Schizoeffective disorder and Bipolar.

## 2019-10-28 NOTE — ED Notes (Signed)
CBG 21, RN billy notified

## 2019-10-28 NOTE — H&P (Signed)
History and Physical    Robert Little XIP:[id number] DOB: 1941-03-19 DOA: 10/28/2019  PCP: [name]  Patient coming from: [facility] I have personally briefly reviewed patient's old medical records in [place]  Chief Complaint: Fall with hypoglycemia  HPI: Robert Little is a 79 y.o. male with medical history significant for Hx of CAD s/p CABG, chronic diastolic HF, pulmonary fibrosis on 2L, CKD stage 4, Schizo-affect disorder, Type 2 diabetes, and anemia of chronic disease who presents for hypoglycemia.   Patient lives in assisted living facility. He was sitting in a chair and had a fall witnessed by his roommate. He was found by caretaker on the floor and was weak and confused. BG of 25 on EMS arrival. He was given D10 with improvement to 170. An hour after arrival in the ED, glucose of 21. D50 given with improvement but later again drop to 50s.  He takes glipizide and had a dose today.  Per court appointment caretaker, he has been changing up patient's diet. He has been giving him low-sodium diet and cutting out sweets. Also cut down his 3 meals to one meals with only snacks during lunch and dinner.   Patient unable to provide any history. States he feels "fine" at this point. No dizziness/lightheadedness. No chest pain or shortness. No pain.   Temp of 97.7. Hypertensive up to 180/60.  WBC of 11. Hemoglobin of 9.7. Na of 124, creatinine of 2.13 which is around his baseline.  CT head negative.   He was given 1L of IV LR.    Review of Systems:  Unable to full obtain since patient only oriented to self and place.  Past Medical History:  Diagnosis Date  . Anemia   . Coronary artery disease   . GERD (gastroesophageal reflux disease)   . Hypertension   . Pulmonary fibrosis ([place])   . Schizo affective schizophrenia [facility])     Past Surgical History:  Procedure Laterality Date  . CORONARY ARTERY BYPASS GRAFT       reports  that he has quit smoking. His smoking use included cigarettes. He has never used smokeless tobacco. He reports previous alcohol use. He reports previous drug use.  No Known Allergies  Family History  Problem Relation Age of Onset  . Diabetes Mellitus II Mother   . Diabetes Mellitus II Maternal Grandmother   . Heart disease Maternal Grandmother      Prior to Admission medications   Medication Sig Start Date End Date Taking? Authorizing Provider  acetaminophen (TYLENOL) 325 MG tablet Take 2 tablets (650 mg total) by mouth every 6 (six) hours as needed for mild pain (or Fever >/= 101). Patient taking differently: Take 325-650 mg by mouth at bedtime.  05/18/18  Yes Debbe Odea, MD  albuterol (VENTOLIN HFA) 108 (90 Base) MCG/ACT inhaler Inhale 2 puffs into the lungs every 6 (six) hours as needed for wheezing or shortness of breath. Patient taking differently: Inhale 2 puffs into the lungs at bedtime.  09/21/19  Yes Mercy Riding, MD  aspirin EC 81 MG EC tablet Take 1 tablet (81 mg total) by mouth daily. 09/22/19  Yes Mercy Riding, MD  atenolol (TENORMIN) 25 MG tablet Take 25 mg by mouth daily.   Yes [provider]  atorvastatin (LIPITOR) 20 MG tablet Take 20 mg by mouth at bedtime.   Yes [provider]  benztropine (COGENTIN) 0.5 MG tablet Take 0.5 mg by mouth 2 (two)  times daily.   Yes [provider]  Calcium Carbonate-Vitamin D (CALCIUM 600/VITAMIN D) 600-400 MG-UNIT chew tablet Chew 1 tablet by mouth 2 (two) times daily.   Yes [provider]  clonazePAM (KLONOPIN) 0.5 MG tablet Take 0.5 mg by mouth 2 (two) times daily.    Yes [provider]  Dexlansoprazole (DEXILANT) 30 MG capsule Take 30 mg by mouth daily.   Yes [provider]  ferrous sulfate 325 (65 FE) MG tablet Take 1 tablet (325 mg total) by mouth 2 (two) times daily with a meal. 09/21/19  Yes Gonfa, Taye T, MD  fluticasone furoate-vilanterol (BREO ELLIPTA) 200-25 MCG/INH AEPB  Inhale 1 puff into the lungs daily. 09/22/19  Yes Mercy Riding, MD  furosemide (LASIX) 40 MG tablet Take 1 tablet (40 mg total) by mouth daily. 09/22/19  Yes Mercy Riding, MD  glipiZIDE (GLUCOTROL) 5 MG tablet Take 1 tablet (5 mg total) by mouth 2 (two) times daily. 09/21/19 01/19/20 Yes Mercy Riding, MD  Multiple Vitamin (MULTIVITAMIN WITH MINERALS) TABS tablet Take 1 tablet by mouth daily.   Yes [provider]  potassium chloride SA (KLOR-CON) 20 MEQ tablet Take 1 tablet (20 mEq total) by mouth daily. 09/22/19  Yes Mercy Riding, MD  tamsulosin (FLOMAX) 0.4 MG CAPS capsule Take 1 capsule (0.4 mg total) by mouth daily after supper. Patient taking differently: Take 0.4 mg by mouth daily.  05/18/18  Yes Debbe Odea, MD  umeclidinium bromide (INCRUSE ELLIPTA) 62.5 MCG/INH AEPB Inhale 1 puff into the lungs daily. 09/22/19  Yes Mercy Riding, MD  vitamin B-12 (CYANOCOBALAMIN) 500 MCG tablet Take 500 mcg by mouth 2 (two) times daily.   Yes [provider]  calcium gluconate 500 MG tablet Take 1 tablet (500 mg total) by mouth 2 (two) times daily. Patient not taking: Reported on 10/28/2019 09/07/19   Patrecia Pour, MD  paliperidone (INVEGA SUSTENNA) 156 MG/ML SUSY injection Inject 156 mg into the muscle every 30 (thirty) days.    [provider]  polyethylene glycol powder (MIRALAX) 17 GM/SCOOP powder Take 17 g by mouth 2 (two) times daily as needed for moderate constipation. Patient not taking: Reported on 10/28/2019 09/21/19   Mercy Riding, MD  senna-docusate (SENOKOT-S) 8.6-50 MG tablet Take 1 tablet by mouth 2 (two) times daily between meals as needed for mild constipation. Patient not taking: Reported on 10/28/2019 09/21/19   Mercy Riding, MD    Physical Exam: Vitals:   10/28/19 1831 10/28/19 1840 10/28/19 1841 10/28/19 1842  BP:   (!) 181/59   Pulse:   88   Resp:   (!) 22   Temp:   97.7 F (36.5 C)   TempSrc:   Oral   SpO2: 100% 100%    Weight:    69.4 kg  Height:     5\' 8"  (1.727 m)    Constitutional: NAD, calm, comfortable, elderly gentleman laying at 40 degree incline in bed Vitals:   10/28/19 1831 10/28/19 1840 10/28/19 1841 10/28/19 1842  BP:   (!) 181/59   Pulse:   88   Resp:   (!) 22   Temp:   97.7 F (36.5 C)   TempSrc:   Oral   SpO2: 100% 100%    Weight:    69.4 kg  Height:    5\' 8"  (1.727 m)   Eyes: PERRL, lids and conjunctivae normal ENMT: Mucous membranes are moist.  Missing dentition.  Neck: normal, supple Respiratory:  Decreased breath sounds throughout no wheezing, no crackles. Normal respiratory effort on 2 L. No accessory muscle use.  Cardiovascular: Regular rate and rhythm, no murmurs / rubs / gallops. No extremity edema. 2+ pedal pulses.  Healed surgical midsternal scar. Abdomen: no tenderness, no masses palpated. Bowel sounds positive.  Musculoskeletal: no clubbing / cyanosis. No joint deformity upper and lower extremities. Good ROM, no contractures. Normal muscle tone.  Skin: no rashes, lesions, ulcers. No induration Neurologic: CN 2-12 grossly intact. Sensation intact. Strength 4/5 in lower extremity. Psychiatric: Alert oriented only to self and location.  Pleasant mood.   Labs on Admission: I have personally reviewed following labs and imaging studies  CBC: Recent Labs  Lab 10/28/19 2121  WBC 11.0*  NEUTROABS 8.5*  HGB 9.7*  HCT 28.8*  MCV 88.1  PLT 161   Basic Metabolic Panel: Recent Labs  Lab 10/28/19 2121  NA 124*  K 3.7  CL 87*  CO2 22  GLUCOSE 97  BUN 43*  CREATININE 2.13*  CALCIUM 8.2*   GFR: Estimated Creatinine Clearance: 28.1 mL/min (A) (by C-G formula based on SCr of 2.13 mg/dL (H)). Liver Function Tests: Recent Labs  Lab 10/28/19 2121  AST 21  ALT 12  ALKPHOS 80  BILITOT 0.9  PROT 6.4*  ALBUMIN 3.6   No results for input(s): LIPASE, AMYLASE in the last 168 hours. No results for input(s): AMMONIA in the last 168 hours. Coagulation Profile: No results for input(s): INR, PROTIME in  the last 168 hours. Cardiac Enzymes: No results for input(s): CKTOTAL, CKMB, CKMBINDEX, TROPONINI in the last 168 hours. BNP (last 3 results) No results for input(s): PROBNP in the last 8760 hours. HbA1C: No results for input(s): HGBA1C in the last 72 hours. CBG: Recent Labs  Lab 10/28/19 1933 10/28/19 2015 10/28/19 2120 10/28/19 2249  GLUCAP 21* 145* 114* 54*   Lipid Profile: No results for input(s): CHOL, HDL, LDLCALC, TRIG, CHOLHDL, LDLDIRECT in the last 72 hours. Thyroid Function Tests: No results for input(s): TSH, T4TOTAL, FREET4, T3FREE, THYROIDAB in the last 72 hours. Anemia Panel: No results for input(s): VITAMINB12, FOLATE, FERRITIN, TIBC, IRON, RETICCTPCT in the last 72 hours. Urine analysis:    Component Value Date/Time   COLORURINE COLORLESS (A) 10/28/2019 2121   APPEARANCEUR CLEAR 10/28/2019 2121   LABSPEC 1.003 (L) 10/28/2019 2121   PHURINE 6.0 10/28/2019 2121   GLUCOSEU NEGATIVE 10/28/2019 2121   HGBUR NEGATIVE 10/28/2019 2121   [place] NEGATIVE 10/28/2019 2121   KETONESUR NEGATIVE 10/28/2019 2121   PROTEINUR NEGATIVE 10/28/2019 2121   NITRITE NEGATIVE 10/28/2019 2121   LEUKOCYTESUR NEGATIVE 10/28/2019 2121    Radiological Exams on Admission: CT Head Wo Contrast  Result Date: 10/28/2019 CLINICAL DATA:  Un witnessed fall, weakness, confusion EXAM: CT HEAD WITHOUT CONTRAST TECHNIQUE: Contiguous axial images were obtained from the base of the skull through the vertex without intravenous contrast. COMPARISON:  None. FINDINGS: Brain: Hypodensities in the periventricular white matter, most pronounced in the left frontal region, most likely reflect chronic small vessel ischemic change. No signs of acute infarct or hemorrhage. Lateral ventricles and remaining midline structures are unremarkable. No acute extra-axial fluid collections. No mass effect. Vascular: No hyperdense vessel or unexpected calcification. Skull: Normal. Negative for fracture or focal lesion.  Sinuses/Orbits: Mucoperiosteal thickening within the maxillary sinuses, right greater than left. Remaining sinuses are clear. Other: None. IMPRESSION: 1. No acute intracranial trauma. 2. Likely chronic small vessel ischemic changes within the frontal periventricular white matter. Electronically Signed   By: Legrand Como  Owens Shark M.D.   On: 10/28/2019 19:31   DG Chest Portable 1 View  Result Date: 10/28/2019 CLINICAL DATA:  79 year old male with altered mental status. EXAM: PORTABLE CHEST 1 VIEW COMPARISON:  Chest radiograph dated 09/19/2019 and CT dated 09/19/2019 FINDINGS: Diffuse interstitial scarring in keeping with fibrosis. No new consolidation. There is no pleural effusion or pneumothorax. Stable cardiomediastinal silhouette. Atherosclerotic calcification of the aorta. Median sternotomy wires and CABG vascular clips. IMPRESSION: 1. No acute cardiopulmonary process. 2. Fibrotic changes. Electronically Signed   By: Anner Crete M.D.   On: 10/28/2019 21:16    EKG: Independently reviewed.  Assessment/Plan Hypoglycemia in the setting of Type 2 diabetes Last HbA1C of 7 in March.  On Glipizide and care-taker has been giving more strict diet. Given his HbA1C being well controlled and new controlled diet, would recommend d/c of Glipizide at discharge Will start IV dextrose insulin  Will check CBG q2hrs  Hyponatremia Possibly due to hypovolemia and decrease PO sodium intake Will check stat BMP now to assess Na level after his 1L of LR and dextrose fluid in the ED   Anemia of chronic disease Stable Continue iron and vitamin B12 supplement  CAD s/p CABG No issues.  Continue aspirin.  Chronic diastolic HF Appears euvolemic.  Continue atenolol and Lasix.  Pulmonary fibrosis with chronic hypoxemia Baseline 2L  Continue bronchodilators  CKD stage 4 Stable  Avoid nephrotoxic agent   Schizo-affect disorder/bipolar disorder  Continue benztropine and clonazepam  DVT prophylaxis:.Lovenox Code  Status: Full-confirmed with court appt care giver [name] Family Communication: Plan discussed with patient at bedside and with caregiver disposition Plan: [name] facility with observation Consults called:  Admission status: Observation Status is: Observation  The patient remains OBS appropriate and will d/c before 2 midnights.  Dispo: The patient is from: Group home              Anticipated d/c is to: Group home              Anticipated d/c date is: 1 day              Patient currently is not medically stable to d/c.         Orene Desanctis DO [facility]   If 7PM-7AM, please contact night-coverage [email or web address]   10/28/2019, 11:20 PM

## 2019-10-28 NOTE — ED Notes (Signed)
Pt offered coke to drink

## 2019-10-28 NOTE — ED Provider Notes (Signed)
[facility] EMERGENCY DEPARTMENT Provider Note   CSN: [id number] Arrival date & time: 10/28/19  1815     History Chief Complaint  Patient presents with  . Hypoglycemia  . Fall    Robert Little is a 79 y.o. male.  The history is provided by the patient, the EMS personnel and a caregiver. The history is limited by the condition of the patient.     79 year old male with a past medical history of CAD, HTN, GERD, pulmonary fibrosis on home oxygen presenting to the emergency department brought in via EMS following a fall found to be hypoglycemic.  Patient lives in an assisted living facility called the [facility].  Patient had an unwitnessed fall.  Per EMS the patient's caretaker heard when he fell onto the carpet.  He was found on the floor weak and slightly confused.  EMS noted the patient's initial blood glucose was 25.  The patient received D10 IV with subsequent blood glucose per EMS in the [place] [facility].  Patient is not on anticoagulation and did not lose consciousness per the caretaker.  Patient is noted to have schizoaffective disorder and bipolar disorder.  Patient complains of no pain anywhere.  No wounds noted per EMS or patient's caretaker.  Patient is not on insulin.  Patient takes glipizide, atenolol.  Additional history gathered from the patient's residence caregiver.  Patient was started on glipizide on March 20.  Patient does not self administer his own medications.  The patient last ate at approximately 1300.  Patient was planned to have a snack at 1700 at which point he had his hypoglycemic episode and fall.  The caregiver reported the patient was sitting in a chair in his room prior to the fall.  He suspects that the patient fell out of the chair onto the carpet.  No other recent medication changes.  The patient has been eating and drinking normally.  He has not noticed any other symptoms.  Very low likelihood that the patient could have received too much glipizide or  atenolol.  Past Medical History:  Diagnosis Date  . Anemia   . Coronary artery disease   . GERD (gastroesophageal reflux disease)   . Hypertension   . Pulmonary fibrosis ([place])   . Schizo affective schizophrenia [facility])     Patient Active Problem List   Diagnosis Date Noted  . Acute respiratory failure with hypoxia ([place]) 09/17/2019  . Positive D dimer 09/17/2019  . Acute on chronic diastolic CHF (congestive heart failure) ([place]) 09/17/2019  . CHF (congestive heart failure) ([place]) 09/17/2019  . Acute hypoxemic respiratory failure ([place]) 09/17/2019  . Syncope 09/05/2019  . Hypocalcemia 09/05/2019  . Hypomagnesemia 09/05/2019  . Acute urinary retention 05/18/2018  . UTI (urinary tract infection) 05/15/2018  . Sepsis ([place]) 05/15/2018  . Hypokalemia 05/15/2018  . Acute renal failure superimposed on stage 4 chronic kidney disease ([place]) 05/15/2018  . HLD (hyperlipidemia) 05/15/2018  . Coronary artery disease   . Hypertension   . Pulmonary fibrosis ([place])   . Schizo affective schizophrenia ([place])   . Normocytic anemia     Past Surgical History:  Procedure Laterality Date  . CORONARY ARTERY BYPASS GRAFT         Family History  Problem Relation Age of Onset  . Diabetes Mellitus II Mother   . Diabetes Mellitus II Maternal Grandmother   . Heart disease Maternal Grandmother     Social History   Tobacco Use  . Smoking status: Former Smoker  Types: Cigarettes  . Smokeless tobacco: Never Used  Substance Use Topics  . Alcohol use: Not Currently  . Drug use: Not Currently    Home Medications Prior to Admission medications   Medication Sig Start Date End Date Taking? Authorizing Provider  acetaminophen (TYLENOL) 325 MG tablet Take 2 tablets (650 mg total) by mouth every 6 (six) hours as needed for mild pain (or Fever >/= 101). Patient taking differently: Take 325-650 mg by mouth at bedtime.  05/18/18  Yes Debbe Odea, MD  albuterol (VENTOLIN HFA) 108 (90 Base) MCG/ACT  inhaler Inhale 2 puffs into the lungs every 6 (six) hours as needed for wheezing or shortness of breath. Patient taking differently: Inhale 2 puffs into the lungs at bedtime.  09/21/19  Yes Mercy Riding, MD  aspirin EC 81 MG EC tablet Take 1 tablet (81 mg total) by mouth daily. 09/22/19  Yes Mercy Riding, MD  atenolol (TENORMIN) 25 MG tablet Take 25 mg by mouth daily.   Yes [provider]  atorvastatin (LIPITOR) 20 MG tablet Take 20 mg by mouth at bedtime.   Yes [provider]  benztropine (COGENTIN) 0.5 MG tablet Take 0.5 mg by mouth 2 (two) times daily.   Yes [provider]  Calcium Carbonate-Vitamin D (CALCIUM 600/VITAMIN D) 600-400 MG-UNIT chew tablet Chew 1 tablet by mouth 2 (two) times daily.   Yes [provider]  clonazePAM (KLONOPIN) 0.5 MG tablet Take 0.5 mg by mouth 2 (two) times daily.    Yes [provider]  Dexlansoprazole (DEXILANT) 30 MG capsule Take 30 mg by mouth daily.   Yes [provider]  ferrous sulfate 325 (65 FE) MG tablet Take 1 tablet (325 mg total) by mouth 2 (two) times daily with a meal. 09/21/19  Yes Gonfa, Taye T, MD  fluticasone furoate-vilanterol (BREO ELLIPTA) 200-25 MCG/INH AEPB Inhale 1 puff into the lungs daily. 09/22/19  Yes Mercy Riding, MD  furosemide (LASIX) 40 MG tablet Take 1 tablet (40 mg total) by mouth daily. 09/22/19  Yes Mercy Riding, MD  glipiZIDE (GLUCOTROL) 5 MG tablet Take 1 tablet (5 mg total) by mouth 2 (two) times daily. 09/21/19 01/19/20 Yes Mercy Riding, MD  Multiple Vitamin (MULTIVITAMIN WITH MINERALS) TABS tablet Take 1 tablet by mouth daily.   Yes [provider]  potassium chloride SA (KLOR-CON) 20 MEQ tablet Take 1 tablet (20 mEq total) by mouth daily. 09/22/19  Yes Mercy Riding, MD  tamsulosin (FLOMAX) 0.4 MG CAPS capsule Take 1 capsule (0.4 mg total) by mouth daily after supper. Patient taking differently: Take 0.4 mg by mouth daily.  05/18/18  Yes Debbe Odea, MD    umeclidinium bromide (INCRUSE ELLIPTA) 62.5 MCG/INH AEPB Inhale 1 puff into the lungs daily. 09/22/19  Yes Mercy Riding, MD  vitamin B-12 (CYANOCOBALAMIN) 500 MCG tablet Take 500 mcg by mouth 2 (two) times daily.   Yes [provider]  calcium gluconate 500 MG tablet Take 1 tablet (500 mg total) by mouth 2 (two) times daily. Patient not taking: Reported on 10/28/2019 09/07/19   Patrecia Pour, MD  paliperidone (INVEGA SUSTENNA) 156 MG/ML SUSY injection Inject 156 mg into the muscle every 30 (thirty) days.    [provider]  polyethylene glycol powder (MIRALAX) 17 GM/SCOOP powder Take 17 g by mouth 2 (two) times daily as needed for moderate constipation. Patient not taking: Reported on 10/28/2019 09/21/19   Mercy Riding, MD  senna-docusate (SENOKOT-S) 8.6-50 MG  tablet Take 1 tablet by mouth 2 (two) times daily between meals as needed for mild constipation. Patient not taking: Reported on 10/28/2019 09/21/19   Mercy Riding, MD    Allergies    Patient has no known allergies.  Review of Systems   Review of Systems  Constitutional: Positive for fatigue. Negative for activity change, appetite change, chills, diaphoresis and fever.  HENT: Negative for congestion and rhinorrhea.   Respiratory: Negative for cough, shortness of breath and wheezing.   Cardiovascular: Negative for chest pain.  Gastrointestinal: Negative for abdominal distention, abdominal pain, diarrhea, nausea and vomiting.  Genitourinary: Negative for decreased urine volume, difficulty urinating, dysuria, frequency and urgency.  Musculoskeletal: Negative for gait problem.  Skin: Negative for color change and wound.  Neurological: Positive for weakness (generalized). Negative for dizziness, syncope, light-headedness and headaches.  Psychiatric/Behavioral: Positive for confusion.  All other systems reviewed and are negative.   Physical Exam Updated Vital Signs BP (!) 181/59 (BP Location: Right Arm)   Pulse 88    Temp 97.7 F (36.5 C) (Oral)   Resp (!) 22   Ht 5\' 8"  (1.727 m)   Wt 69.4 kg   SpO2 100%   BMI 23.26 kg/m   Physical Exam Vitals and nursing note reviewed.  Constitutional:      General: He is not in acute distress.    Appearance: Normal appearance. He is normal weight. He is not ill-appearing.  HENT:     Head: Normocephalic.     Right Ear: External ear normal.     Left Ear: External ear normal.     Nose: Nose normal.     Mouth/Throat:     Mouth: Mucous membranes are moist.     Pharynx: Oropharynx is clear.  Eyes:     Extraocular Movements: Extraocular movements intact.     Pupils: Pupils are equal, round, and reactive to light.  Neck:     Comments: No tenderness to palpation of the C-spine with normal range of motion and no evidence of trauma without any step-offs or deformities Cardiovascular:     Rate and Rhythm: Normal rate and regular rhythm.     Pulses: Normal pulses.     Heart sounds: Normal heart sounds.  Pulmonary:     Effort: Pulmonary effort is normal. No respiratory distress.     Breath sounds: Normal breath sounds. No wheezing or rhonchi.  Abdominal:     General: Bowel sounds are normal.     Palpations: Abdomen is soft.     Tenderness: There is no abdominal tenderness. There is no guarding.  Musculoskeletal:     Cervical back: Normal range of motion.     Right lower leg: No edema.     Left lower leg: No edema.  Skin:    General: Skin is warm and dry.     Capillary Refill: Capillary refill takes less than 2 seconds.  Neurological:     General: No focal deficit present.     Mental Status: He is alert. Mental status is at baseline. He is confused.     Cranial Nerves: No cranial nerve deficit.     Sensory: No sensory deficit.  Psychiatric:        Mood and Affect: Mood normal.     ED Results / Procedures / Treatments   Labs (all labs ordered are listed, but only abnormal results are displayed) Labs Reviewed  CBC WITH DIFFERENTIAL/PLATELET - Abnormal;  Notable for the following components:  Result Value   WBC 11.0 (*)    RBC 3.27 (*)    Hemoglobin 9.7 (*)    HCT 28.8 (*)    Neutro Abs 8.5 (*)    Abs Immature Granulocytes 0.18 (*)    All other components within normal limits  COMPREHENSIVE METABOLIC PANEL - Abnormal; Notable for the following components:   Sodium 124 (*)    Chloride 87 (*)    BUN 43 (*)    Creatinine, Ser 2.13 (*)    Calcium 8.2 (*)    Total Protein 6.4 (*)    GFR calc non Af Amer 29 (*)    GFR calc Af Amer 34 (*)    All other components within normal limits  URINALYSIS, ROUTINE W REFLEX MICROSCOPIC - Abnormal; Notable for the following components:   Color, Urine COLORLESS (*)    Specific Gravity, Urine 1.003 (*)    All other components within normal limits  ACETAMINOPHEN LEVEL - Abnormal; Notable for the following components:   Acetaminophen (Tylenol), Serum <10 (*)    All other components within normal limits  CBG MONITORING, ED - Abnormal; Notable for the following components:   Glucose-Capillary 21 (*)    All other components within normal limits  CBG MONITORING, ED - Abnormal; Notable for the following components:   Glucose-Capillary 145 (*)    All other components within normal limits  CBG MONITORING, ED - Abnormal; Notable for the following components:   Glucose-Capillary 114 (*)    All other components within normal limits  CBG MONITORING, ED - Abnormal; Notable for the following components:   Glucose-Capillary 54 (*)    All other components within normal limits  RESPIRATORY PANEL BY RT PCR (FLU A&B, COVID)  ETHANOL  OSMOLALITY  OSMOLALITY, URINE  CREATININE, URINE, RANDOM  PROTEIN / CREATININE RATIO, URINE  UREA NITROGEN, URINE  MAGNESIUM  URIC ACID  SODIUM, URINE, RANDOM  CORTISOL  TSH  T4, FREE  BASIC METABOLIC PANEL    EKG None  Radiology CT Head Wo Contrast  Result Date: 10/28/2019 CLINICAL DATA:  Un witnessed fall, weakness, confusion EXAM: CT HEAD WITHOUT CONTRAST  TECHNIQUE: Contiguous axial images were obtained from the base of the skull through the vertex without intravenous contrast. COMPARISON:  None. FINDINGS: Brain: Hypodensities in the periventricular white matter, most pronounced in the left frontal region, most likely reflect chronic small vessel ischemic change. No signs of acute infarct or hemorrhage. Lateral ventricles and remaining midline structures are unremarkable. No acute extra-axial fluid collections. No mass effect. Vascular: No hyperdense vessel or unexpected calcification. Skull: Normal. Negative for fracture or focal lesion. Sinuses/Orbits: Mucoperiosteal thickening within the maxillary sinuses, right greater than left. Remaining sinuses are clear. Other: None. IMPRESSION: 1. No acute intracranial trauma. 2. Likely chronic small vessel ischemic changes within the frontal periventricular white matter. Electronically Signed   By: Randa Ngo M.D.   On: 10/28/2019 19:31   DG Chest Portable 1 View  Result Date: 10/28/2019 CLINICAL DATA:  79 year old male with altered mental status. EXAM: PORTABLE CHEST 1 VIEW COMPARISON:  Chest radiograph dated 09/19/2019 and CT dated 09/19/2019 FINDINGS: Diffuse interstitial scarring in keeping with fibrosis. No new consolidation. There is no pleural effusion or pneumothorax. Stable cardiomediastinal silhouette. Atherosclerotic calcification of the aorta. Median sternotomy wires and CABG vascular clips. IMPRESSION: 1. No acute cardiopulmonary process. 2. Fibrotic changes. Electronically Signed   By: Anner Crete M.D.   On: 10/28/2019 21:16    Procedures Procedures (including critical care time)  Medications  Ordered in ED Medications  lactated ringers bolus 1,000 mL (has no administration in time range)  dextrose 10 % infusion (has no administration in time range)  dextrose 50 % solution 50 mL (50 mLs Intravenous Given 10/28/19 1948)    ED Course  I have reviewed the triage vital signs and the  nursing notes.  Pertinent labs & imaging results that were available during my care of the patient were reviewed by me and considered in my medical decision making (see chart for details).    MDM Rules/Calculators/A&P                      80 year old male with a past medical history of CAD, HTN, GERD, pulmonary fibrosis on home oxygen presenting to the emergency department brought in via EMS following a fall found to be hypoglycemic.  Differential diagnoses considered include medication side effect, glipizide versus atenolol, ingestion, less likely ischemia or infection low suspicion for CVA, poor p.o. intake  Initial interventions 1 L IV LR initial point-of-care glucose reported to me by nursing staff upon the patient's arrival was 94.  Repeat glucose at 1934 was 21.  Patient's caregiver noted that he was acting altered once again after returning from his CT head scan and called nursing staff into the room.  Patient was subsequently treated with 1 amp of D50.  He was also given a Coca-Cola.  Patient rapidly returned to his baseline.  Given the patient's repeat hypoglycemia my concern is for possible sulfonylurea toxicity.  Patient will need prolonged monitoring and hospitalization if this is the etiology of the patient's hypoglycemia.  ECG interpreted by me demonstrated Sinus rhythm at 63 bpm, normal axis, normal intervals, no ST or T wave changes suggestive of acute ischemia, normal EKG, improved QTc interval compared to previous otherwise no significant changes compared to previous on 09/17/2019  CXR interpreted by me demonstrated no acute cardiopulmonary processes, fibrotic changes, largely unchanged compared to previous on 09/19/2019  CT head no acute intracranial abnormalities, likely chronic small vessel ischemic changes within the frontal periventricular white matter  Labs demonstrated slight leukocytosis of 11.0 with him mild left shift, normocytic anemia with a hemoglobin 9.7 and MCV of  88.1, no thrombocytosis or thrombocytopenia with platelet count of 236, severe hyponatremia at 124, moderate hypochloremia at 87, serum creatinine 2.13 with a BUN of 43 which appears improved compared to previous, mild hypocalcemia 8.2, serum ethanol negative, urinalysis unremarkable no with a specific gravity of 1.003 suggestive of dehydration, negative acetaminophen level   Repeat glucose at approximately 2245 was 54.  We will initiate the patient on a D10 infusion. Urine studies, cortisol thyroid studies and serum osms ordered.   We will admit to hospitalist for multiple severe metabolic derangements and persistent hyperglycemia with unclear etiology requiring multiple dextrose boluses  The plan for this patient was discussed with Dr. Reather Converse, who voiced agreement and who oversaw evaluation and treatment of this patient.  Final Clinical Impression(s) / ED Diagnoses Final diagnoses:  Fall, initial encounter  Hypoglycemia  Hyponatremia  Hypochloremia    Rx / DC Orders ED Discharge Orders    None       Filbert Berthold, MD 10/28/19 2310    Elnora Morrison, MD 10/29/19 0000

## 2019-10-28 NOTE — ED Notes (Signed)
Nurse Abe People was informed of Pt CBG 54 mg.

## 2019-10-28 NOTE — ED Notes (Signed)
Pt A+Ox4 post D50

## 2019-10-28 NOTE — ED Notes (Signed)
[name] court apt care giver [phone] looking for an update on the pt

## 2019-10-28 NOTE — ED Notes (Signed)
This nurse was called to pt room by ED Tech for CBG 21 with new onset confusion. MD notified. D50 ordered verbally and given IV

## 2019-10-29 ENCOUNTER — Other Ambulatory Visit: Payer: Self-pay

## 2019-10-29 DIAGNOSIS — E162 Hypoglycemia, unspecified: Secondary | ICD-10-CM | POA: Diagnosis not present

## 2019-10-29 LAB — BASIC METABOLIC PANEL
Anion gap: 14 (ref 5–15)
Anion gap: 14 (ref 5–15)
BUN: 43 mg/dL — ABNORMAL HIGH (ref 8–23)
BUN: 41 mg/dL — ABNORMAL HIGH (ref 8–23)
CO2: 23 mmol/L (ref 22–32)
CO2: 21 mmol/L — ABNORMAL LOW (ref 22–32)
Calcium: 8.4 mg/dL — ABNORMAL LOW (ref 8.9–10.3)
Calcium: 8.1 mg/dL — ABNORMAL LOW (ref 8.9–10.3)
Chloride: 89 mmol/L — ABNORMAL LOW (ref 98–111)
Chloride: 90 mmol/L — ABNORMAL LOW (ref 98–111)
Creatinine, Ser: 2.08 mg/dL — ABNORMAL HIGH (ref 0.61–1.24)
Creatinine, Ser: 2.05 mg/dL — ABNORMAL HIGH (ref 0.61–1.24)
GFR calc Af Amer: 35 mL/min — ABNORMAL LOW (ref >60)
GFR calc Af Amer: 35 mL/min — ABNORMAL LOW (ref >60)
GFR calc non Af Amer: 30 mL/min — ABNORMAL LOW (ref >60)
GFR calc non Af Amer: 30 mL/min — ABNORMAL LOW (ref >60)
Glucose, Bld: 54 mg/dL — ABNORMAL LOW (ref 70–99)
Glucose, Bld: 68 mg/dL — ABNORMAL LOW (ref 70–99)
Potassium: 3.9 mmol/L (ref 3.5–5.1)
Potassium: 3.8 mmol/L (ref 3.5–5.1)
Sodium: 126 mmol/L — ABNORMAL LOW (ref 135–145)
Sodium: 125 mmol/L — ABNORMAL LOW (ref 135–145)

## 2019-10-29 LAB — CBG MONITORING, ED
Glucose-Capillary: 98 mg/dL (ref 70–99)
Glucose-Capillary: 77 mg/dL (ref 70–99)
Glucose-Capillary: 126 mg/dL — ABNORMAL HIGH (ref 70–99)
Glucose-Capillary: 58 mg/dL — ABNORMAL LOW (ref 70–99)
Glucose-Capillary: 75 mg/dL (ref 70–99)
Glucose-Capillary: 234 mg/dL — ABNORMAL HIGH (ref 70–99)
Glucose-Capillary: 231 mg/dL — ABNORMAL HIGH (ref 70–99)

## 2019-10-29 LAB — T4, FREE: Free T4: 0.71 ng/dL (ref 0.61–1.12)

## 2019-10-29 LAB — OSMOLALITY: Osmolality: 271 mOsm/kg — ABNORMAL LOW (ref 275–295)

## 2019-10-29 LAB — GLUCOSE, CAPILLARY
Glucose-Capillary: 159 mg/dL — ABNORMAL HIGH (ref 70–99)
Glucose-Capillary: 177 mg/dL — ABNORMAL HIGH (ref 70–99)
Glucose-Capillary: 213 mg/dL — ABNORMAL HIGH (ref 70–99)
Glucose-Capillary: 184 mg/dL — ABNORMAL HIGH (ref 70–99)

## 2019-10-29 LAB — CBC
HCT: 28.9 % — ABNORMAL LOW (ref 39.0–52.0)
Hemoglobin: 9.6 g/dL — ABNORMAL LOW (ref 13.0–17.0)
MCH: 29 pg (ref 26.0–34.0)
MCHC: 33.2 g/dL (ref 30.0–36.0)
MCV: 87.3 fL (ref 80.0–100.0)
Platelets: 242 10*3/uL (ref 150–400)
RBC: 3.31 MIL/uL — ABNORMAL LOW (ref 4.22–5.81)
RDW: 14.6 % (ref 11.5–15.5)
WBC: 11.3 10*3/uL — ABNORMAL HIGH (ref 4.0–10.5)
nRBC: 0 % (ref 0.0–0.2)

## 2019-10-29 LAB — URIC ACID: Uric Acid, Serum: 8.9 mg/dL — ABNORMAL HIGH (ref 3.7–8.6)

## 2019-10-29 LAB — CORTISOL: Cortisol, Plasma: 13.7 ug/dL

## 2019-10-29 LAB — TSH: TSH: 4.766 u[IU]/mL — ABNORMAL HIGH (ref 0.350–4.500)

## 2019-10-29 LAB — MAGNESIUM: Magnesium: 1.1 mg/dL — ABNORMAL LOW (ref 1.7–2.4)

## 2019-10-29 NOTE — Discharge Summary (Signed)
Physician Discharge Summary  Robert Little WUJ:[id number] DOB: August 26, 1940 DOA: 10/28/2019  PCP: [facility], [name]  Admit date: 10/28/2019 Discharge date: 10/29/2019  Admitted From: [facility] home Disposition: Same  Recommendations for Outpatient Follow-up:  1. Follow up with PCP in 1-2 weeks 2. Please obtain BMP/CBC in one week  Discharge Condition: Stable CODE STATUS: Full Diet recommendation: Diabetic diet  Brief/Interim Summary: Robert Little is a 79 y.o. male with medical history significant for Hx of CAD s/p CABG, chronic diastolic HF, pulmonary fibrosis on 2L, CKD stage 4, Schizo-affect disorder, Type 2 diabetes, and anemia of chronic disease who presents for hypoglycemia. Patient lives in assisted living facility. He was sitting in a chair and had a fall witnessed by his roommate. He was found by caretaker on the floor and was weak and confused. BG of 25 on EMS arrival. He was given D10 with improvement to 170. An hour after arrival in the ED, glucose of 21. D50 given with improvement but later again drop to 50s. He takes glipizide and had a dose today. Per court appointment caretaker, he has been changing up patient's diet. He has been giving him low-sodium diet and cutting out sweets. Also cut down his 3 meals to one meals with only snacks during lunch and dinner. Patient unable to provide any history. States he feels "fine" at this point. No dizziness/lightheadedness. No chest pain or shortness. No pain. Temp of 97.7. Hypertensive up to 180/60. WBC of 11. Hemoglobin of 9.7. Na of 124, creatinine of 2.13 which is around his baseline. CT head negative. He was given 1L of IV LR.   Patient admitted as above with acute symptomatic hypoglycemia in the setting of strictly controlled diabetes.  Patient has been on glipizide for some time taking once daily and over the past few weeks has become markedly more compliant with diabetic diet and low-sodium diet per new caretaker.   Given strict diet control and ongoing glipizide patient had rebound hypoglycemia, now resolved with D10 in the ED overnight and early this morning now discontinued patient tolerating p.o. quite well glucose remaining controlled in the mid to high 100s at this point.  We discussed at length at bedside that if patient continues his very strict dietary control he may be able to discontinue all his diabetic medications.  At this time patient will be discharged without glipizide, continue daily point-of-care Accu-Cheks and strict dietary control.  Follow-up with PCP in the next 3 to 5 days for repeat evaluation.  If patient's glucose becomes uncontrolled patient may be able to restart glipizide at a lower dose however if patient's glucose remains well controlled he may be able to maintain off of medication.  Patient sodium was also borderline low in the setting of salt restricted diet.  We discussed liberalizing his salt diet following up with PCP later this week for repeat labs.  Patient otherwise stable and agreeable for discharge home with close follow-up as outlined above.  Discharge Diagnoses:  Principal Problem:   Hypoglycemia Active Problems:   Coronary artery disease   Pulmonary fibrosis (HCC)   Schizo affective schizophrenia (HCC)   Normocytic anemia   CKD (chronic kidney disease), stage IV (HCC)   CHF (congestive heart failure) ([place])   Hyponatremia    Discharge Instructions  Discharge Instructions    Call MD for:  difficulty breathing, headache or visual disturbances   Complete by: As directed    Call MD for:  extreme fatigue   Complete by: As  directed    Call MD for:  hives   Complete by: As directed    Call MD for:  persistant dizziness or light-headedness   Complete by: As directed    Call MD for:  persistant nausea and vomiting   Complete by: As directed    Call MD for:  severe uncontrolled pain   Complete by: As directed    Call MD for:  temperature >100.4   Complete by: As  directed    Diet - low sodium heart healthy   Complete by: As directed    Increase activity slowly   Complete by: As directed      Allergies as of 10/29/2019   No Known Allergies     Medication List    STOP taking these medications   glipiZIDE 5 MG tablet Commonly known as: GLUCOTROL     TAKE these medications   acetaminophen 325 MG tablet Commonly known as: TYLENOL Take 2 tablets (650 mg total) by mouth every 6 (six) hours as needed for mild pain (or Fever >/= 101). What changed:   how much to take  when to take this   albuterol 108 (90 Base) MCG/ACT inhaler Commonly known as: VENTOLIN HFA Inhale 2 puffs into the lungs every 6 (six) hours as needed for wheezing or shortness of breath. What changed: when to take this   aspirin 81 MG EC tablet Take 1 tablet (81 mg total) by mouth daily.   atenolol 25 MG tablet Commonly known as: TENORMIN Take 25 mg by mouth daily.   atorvastatin 20 MG tablet Commonly known as: LIPITOR Take 20 mg by mouth at bedtime.   benztropine 0.5 MG tablet Commonly known as: COGENTIN Take 0.5 mg by mouth 2 (two) times daily.   Calcium 600/Vitamin D 600-400 MG-UNIT chew tablet Generic drug: Calcium Carbonate-Vitamin D Chew 1 tablet by mouth 2 (two) times daily.   calcium gluconate 500 MG tablet Take 1 tablet (500 mg total) by mouth 2 (two) times daily.   clonazePAM 0.5 MG tablet Commonly known as: KLONOPIN Take 0.5 mg by mouth 2 (two) times daily.   Dexilant 30 MG capsule Generic drug: Dexlansoprazole Take 30 mg by mouth daily.   ferrous sulfate 325 (65 FE) MG tablet Take 1 tablet (325 mg total) by mouth 2 (two) times daily with a meal.   fluticasone furoate-vilanterol 200-25 MCG/INH Aepb Commonly known as: BREO ELLIPTA Inhale 1 puff into the lungs daily.   furosemide 40 MG tablet Commonly known as: LASIX Take 1 tablet (40 mg total) by mouth daily.   Invega Sustenna 156 MG/ML Susy injection Generic drug:  paliperidone Inject 156 mg into the muscle every 30 (thirty) days.   multivitamin with minerals Tabs tablet Take 1 tablet by mouth daily.   polyethylene glycol powder 17 GM/SCOOP powder Commonly known as: MiraLax Take 17 g by mouth 2 (two) times daily as needed for moderate constipation.   potassium chloride SA 20 MEQ tablet Commonly known as: KLOR-CON Take 1 tablet (20 mEq total) by mouth daily.   senna-docusate 8.6-50 MG tablet Commonly known as: Senokot-S Take 1 tablet by mouth 2 (two) times daily between meals as needed for mild constipation.   tamsulosin 0.4 MG Caps capsule Commonly known as: FLOMAX Take 1 capsule (0.4 mg total) by mouth daily after supper. What changed: when to take this   umeclidinium bromide 62.5 MCG/INH Aepb Commonly known as: INCRUSE ELLIPTA Inhale 1 puff into the lungs daily.   vitamin B-12 500 MCG  tablet Commonly known as: CYANOCOBALAMIN Take 500 mcg by mouth 2 (two) times daily.       No Known Allergies  Procedures/Studies: CT Head Wo Contrast  Result Date: 10/28/2019 CLINICAL DATA:  Un witnessed fall, weakness, confusion EXAM: CT HEAD WITHOUT CONTRAST TECHNIQUE: Contiguous axial images were obtained from the base of the skull through the vertex without intravenous contrast. COMPARISON:  None. FINDINGS: Brain: Hypodensities in the periventricular white matter, most pronounced in the left frontal region, most likely reflect chronic small vessel ischemic change. No signs of acute infarct or hemorrhage. Lateral ventricles and remaining midline structures are unremarkable. No acute extra-axial fluid collections. No mass effect. Vascular: No hyperdense vessel or unexpected calcification. Skull: Normal. Negative for fracture or focal lesion. Sinuses/Orbits: Mucoperiosteal thickening within the maxillary sinuses, right greater than left. Remaining sinuses are clear. Other: None. IMPRESSION: 1. No acute intracranial trauma. 2. Likely chronic small vessel  ischemic changes within the frontal periventricular white matter. Electronically Signed   By: Randa Ngo M.D.   On: 10/28/2019 19:31   DG Chest Portable 1 View  Result Date: 10/28/2019 CLINICAL DATA:  79 year old male with altered mental status. EXAM: PORTABLE CHEST 1 VIEW COMPARISON:  Chest radiograph dated 09/19/2019 and CT dated 09/19/2019 FINDINGS: Diffuse interstitial scarring in keeping with fibrosis. No new consolidation. There is no pleural effusion or pneumothorax. Stable cardiomediastinal silhouette. Atherosclerotic calcification of the aorta. Median sternotomy wires and CABG vascular clips. IMPRESSION: 1. No acute cardiopulmonary process. 2. Fibrotic changes. Electronically Signed   By: Anner Crete M.D.   On: 10/28/2019 21:16     Subjective: No acute issues or events this morning, patient off D10, tolerating p.o. quite well appears to be back to baseline mental status and otherwise declines headache, fevers, chills, nausea, vomiting, diarrhea, constipation.   Discharge Exam: Vitals:   10/29/19 1321 10/29/19 1331  BP: (!) 147/66   Pulse: 66 66  Resp: 19   Temp: 98 F (36.7 C)   SpO2: 100% 98%   Vitals:   10/29/19 1100 10/29/19 1200 10/29/19 1321 10/29/19 1331  BP: (!) 142/53 (!) 157/79 (!) 147/66   Pulse: 68 76 66 66  Resp: (!) 22 (!) 21 19   Temp:   98 F (36.7 C)   TempSrc:   Axillary   SpO2: 97% 98% 100% 98%  Weight:      Height:        General:  Pleasantly resting in bed, No acute distress. HEENT:  Normocephalic atraumatic.  Sclerae nonicteric, noninjected.  Extraocular movements intact bilaterally.  Extremely poor dentition Neck:  Without mass or deformity.  Trachea is midline. Lungs:  Clear to auscultate bilaterally without rhonchi, wheeze, or rales. Heart:  Regular rate and rhythm.  Without murmurs, rubs, or gallops. Abdomen:  Soft, nontender, nondistended.  Without guarding or rebound. Extremities: Without cyanosis, clubbing, edema, or obvious  deformity. Vascular:  Dorsalis pedis and posterior tibial pulses palpable bilaterally. Skin:  Warm and dry, no erythema, no ulcerations.   The results of significant diagnostics from this hospitalization (including imaging, microbiology, ancillary and laboratory) are listed below for reference.     Microbiology: Recent Results (from the past 240 hour(s))  Respiratory Panel by RT PCR (Flu A&B, Covid) - Nasopharyngeal Swab     Status: None   Collection Time: 10/28/19 11:07 PM   Specimen: Nasopharyngeal Swab  Result Value Ref Range Status   SARS Coronavirus 2 by RT PCR NEGATIVE NEGATIVE Final    Comment: (NOTE) SARS-CoV-2 target nucleic acids  are NOT DETECTED. The SARS-CoV-2 RNA is generally detectable in upper respiratoy specimens during the acute phase of infection. The lowest concentration of SARS-CoV-2 viral copies this assay can detect is 131 copies/mL. A negative result does not preclude SARS-Cov-2 infection and should not be used as the sole basis for treatment or other patient management decisions. A negative result may occur with  improper specimen collection/handling, submission of specimen other than nasopharyngeal swab, presence of viral mutation(s) within the areas targeted by this assay, and inadequate number of viral copies (<131 copies/mL). A negative result must be combined with clinical observations, patient history, and epidemiological information. The expected result is Negative. Fact Sheet for Patients:  PinkCheek.be Fact Sheet for Healthcare Providers:  GravelBags.it This test is not yet ap proved or cleared by the [place] FDA and  has been authorized for detection and/or diagnosis of SARS-CoV-2 by FDA under an Emergency Use Authorization (EUA). This EUA will remain  in effect (meaning this test can be used) for the duration of the COVID-19 declaration under Section 564(b)(1) of the Act, 21  U.S.C. section 360bbb-3(b)(1), unless the authorization is terminated or revoked sooner.    Influenza A by PCR NEGATIVE NEGATIVE Final   Influenza B by PCR NEGATIVE NEGATIVE Final    Comment: (NOTE) The Xpert Xpress SARS-CoV-2/FLU/RSV assay is intended as an aid in  the diagnosis of influenza from Nasopharyngeal swab specimens and  should not be used as a sole basis for treatment. Nasal washings and  aspirates are unacceptable for Xpert Xpress SARS-CoV-2/FLU/RSV  testing. Fact Sheet for Patients: PinkCheek.be Fact Sheet for Healthcare Providers: GravelBags.it This test is not yet approved or cleared by the [place] FDA and  has been authorized for detection and/or diagnosis of SARS-CoV-2 by  FDA under an Emergency Use Authorization (EUA). This EUA will remain  in effect (meaning this test can be used) for the duration of the  Covid-19 declaration under Section 564(b)(1) of the Act, 21  U.S.C. section 360bbb-3(b)(1), unless the authorization is  terminated or revoked. Performed at [facility], [place] [address], [place], [place] [address]      Labs:  Basic Metabolic Panel: Recent Labs  Lab 10/28/19 2121 10/28/19 2259 10/29/19 0501  NA 124* 126* 125*  K 3.7 3.9 3.8  CL 87* 89* 90*  CO2 22 23 21*  GLUCOSE 97 54* 68*  BUN 43* 43* 41*  CREATININE 2.13* 2.08* 2.05*  CALCIUM 8.2* 8.4* 8.1*  MG  --  1.1*  --    Liver Function Tests: Recent Labs  Lab 10/28/19 2121  AST 21  ALT 12  ALKPHOS 80  BILITOT 0.9  PROT 6.4*  ALBUMIN 3.6   No results for input(s): LIPASE, AMYLASE in the last 168 hours. No results for input(s): AMMONIA in the last 168 hours. CBC: Recent Labs  Lab 10/28/19 2121 10/29/19 0501  WBC 11.0* 11.3*  NEUTROABS 8.5*  --   HGB 9.7* 9.6*  HCT 28.8* 28.9*  MCV 88.1 87.3  PLT 236 242   Cardiac Enzymes: No results for input(s): CKTOTAL, CKMB, CKMBINDEX, TROPONINI in the last 168  hours. BNP: Invalid input(s): POCBNP CBG: Recent Labs  Lab 10/29/19 0435 10/29/19 0528 10/29/19 0814 10/29/19 1014 10/29/19 1211  GLUCAP 58* 126* 75 234* 231*   D-Dimer No results for input(s): DDIMER in the last 72 hours. Hgb A1c No results for input(s): HGBA1C in the last 72 hours. Lipid Profile No results for input(s): CHOL, HDL, LDLCALC, TRIG, CHOLHDL, LDLDIRECT  in the last 72 hours. Thyroid function studies Recent Labs    10/28/19 2259  TSH 4.766*   Anemia work up No results for input(s): VITAMINB12, FOLATE, FERRITIN, TIBC, IRON, RETICCTPCT in the last 72 hours. Urinalysis    Component Value Date/Time   COLORURINE COLORLESS (A) 10/28/2019 2121   APPEARANCEUR CLEAR 10/28/2019 2121   LABSPEC 1.003 (L) 10/28/2019 2121   PHURINE 6.0 10/28/2019 2121   GLUCOSEU NEGATIVE 10/28/2019 2121   HGBUR NEGATIVE 10/28/2019 2121   [place] NEGATIVE 10/28/2019 2121   KETONESUR NEGATIVE 10/28/2019 2121   PROTEINUR NEGATIVE 10/28/2019 2121   NITRITE NEGATIVE 10/28/2019 2121   LEUKOCYTESUR NEGATIVE 10/28/2019 2121   Sepsis Labs Invalid input(s): PROCALCITONIN,  WBC,  LACTICIDVEN Microbiology Recent Results (from the past 240 hour(s))  Respiratory Panel by RT PCR (Flu A&B, Covid) - Nasopharyngeal Swab     Status: None   Collection Time: 10/28/19 11:07 PM   Specimen: Nasopharyngeal Swab  Result Value Ref Range Status   SARS Coronavirus 2 by RT PCR NEGATIVE NEGATIVE Final    Comment: (NOTE) SARS-CoV-2 target nucleic acids are NOT DETECTED. The SARS-CoV-2 RNA is generally detectable in upper respiratoy specimens during the acute phase of infection. The lowest concentration of SARS-CoV-2 viral copies this assay can detect is 131 copies/mL. A negative result does not preclude SARS-Cov-2 infection and should not be used as the sole basis for treatment or other patient management decisions. A negative result may occur with  improper specimen collection/handling, submission of  specimen other than nasopharyngeal swab, presence of viral mutation(s) within the areas targeted by this assay, and inadequate number of viral copies (<131 copies/mL). A negative result must be combined with clinical observations, patient history, and epidemiological information. The expected result is Negative. Fact Sheet for Patients:  PinkCheek.be Fact Sheet for Healthcare Providers:  GravelBags.it This test is not yet ap proved or cleared by the [place] FDA and  has been authorized for detection and/or diagnosis of SARS-CoV-2 by FDA under an Emergency Use Authorization (EUA). This EUA will remain  in effect (meaning this test can be used) for the duration of the COVID-19 declaration under Section 564(b)(1) of the Act, 21 U.S.C. section 360bbb-3(b)(1), unless the authorization is terminated or revoked sooner.    Influenza A by PCR NEGATIVE NEGATIVE Final   Influenza B by PCR NEGATIVE NEGATIVE Final    Comment: (NOTE) The Xpert Xpress SARS-CoV-2/FLU/RSV assay is intended as an aid in  the diagnosis of influenza from Nasopharyngeal swab specimens and  should not be used as a sole basis for treatment. Nasal washings and  aspirates are unacceptable for Xpert Xpress SARS-CoV-2/FLU/RSV  testing. Fact Sheet for Patients: PinkCheek.be Fact Sheet for Healthcare Providers: GravelBags.it This test is not yet approved or cleared by the [place] FDA and  has been authorized for detection and/or diagnosis of SARS-CoV-2 by  FDA under an Emergency Use Authorization (EUA). This EUA will remain  in effect (meaning this test can be used) for the duration of the  Covid-19 declaration under Section 564(b)(1) of the Act, 21  U.S.C. section 360bbb-3(b)(1), unless the authorization is  terminated or revoked. Performed at [facility], [place] [address] [place],  [place] [address]      Time coordinating discharge: Over 30 minutes  SIGNED:   Little Ishikawa, DO [facility] 10/29/2019, 3:09 PM Pager   If 7PM-7AM, please contact night-coverage [email or web address]

## 2019-10-29 NOTE — ED Notes (Signed)
Checked patient 75 notified RN Ryan of blood sugar patient is resting with call bell in reach

## 2019-10-29 NOTE — ED Notes (Addendum)
Checked CBG 126

## 2019-10-29 NOTE — ED Notes (Signed)
RN attempted report x1.  

## 2019-10-29 NOTE — Plan of Care (Signed)
  Problem: Health Behavior/Discharge Planning: Goal: Ability to manage health-related needs will improve Outcome: Progressing   

## 2019-10-29 NOTE — ED Notes (Signed)
Informed RN Billy pt CBG 58

## 2019-10-29 NOTE — Progress Notes (Signed)
Pt was d/c to a group home. At the time of d/c, pt was alert and oriented with VSS on room air as well. Caregiver [name]) called with concerns of not being d/c on oxygen. I let him know that when I got here at 1900, the pt was on room air and he had previously been on room air all day according to his vitals. Pts O2 has been in the high 90s throughout the day. [name] stated that when he got back to the house, the pt was at 82% on room air. He said the pt is normally on 2-3L [place] because he drops with activity. I told him I saw in his notes that he had been on O2 at home due to pulmonary fibrosis and in the ED, but not on the unit. I told him to call back tomorrow, and speak with the doctor if he had anymore questions.

## 2019-10-29 NOTE — ED Notes (Signed)
Breakfast ordered 

## 2019-10-29 NOTE — Progress Notes (Signed)
Robert Robert [id number] Admission Data: 10/29/2019 1:26 PM Attending Provider: Little Ishikawa, MD  RVU:YEBX, Robert Robert [facility] Consults/ Treatment Team:   Robert Robert is a 79 y.o. male patient admitted from ED awake, alert  & orientated  X 3,  Full Code, VSS - Blood pressure (!) 147/66, pulse 66, temperature 98 F (36.7 C), temperature source Axillary, resp. rate 19, height 5\' 8"  (1.727 m), weight 69.4 kg, SpO2 100 %., nasal cannular, no c/o shortness of breath, no c/o chest pain, no distress noted. Tele [id number] placed and pt is currently running:normal sinus rhythm.   IV site WDL:  forearm left, condition patent and no redness with a transparent dsg that's clean dry and intact.  Allergies:  No Known Allergies   Past Medical History:  Diagnosis Date  . Anemia   . Coronary artery disease   . GERD (gastroesophageal reflux disease)   . Hypertension   . Pulmonary fibrosis ([place])   . Schizo affective schizophrenia ([place])      Pt orientation to unit, room and routine. Information packet given to patient/family and safety video watched.  Admission INP armband ID verified with patient/family, and in place. SR up x 2, fall risk assessment complete with Patient and family verbalizing understanding of risks associated with falls. Pt verbalizes an understanding of how to use the call bell and to call for help before getting out of bed.  Skin, clean-dry- intact without evidence of bruising, or skin tears.   No evidence of skin break down noted on exam.    Will cont to monitor and assist as needed.  Robert Schow Shelda Pal, RN 10/29/2019 1:26 PM

## 2019-10-29 NOTE — ED Notes (Signed)
Help patient get on bedpan patient is resting with call bell in reach

## 2019-10-29 NOTE — ED Notes (Signed)
Pt requested a Coke to drink and pt given the same

## 2019-10-29 NOTE — ED Notes (Signed)
CBG-77 RN billy notified

## 2019-10-30 LAB — UREA NITROGEN, URINE: Urea Nitrogen, Ur: 174 mg/dL

## 2019-11-03 DIAGNOSIS — U071 COVID-19: Secondary | ICD-10-CM

## 2019-11-03 HISTORY — DX: COVID-19: U07.1

## 2019-11-05 ENCOUNTER — Telehealth: Payer: Self-pay | Admitting: Internal Medicine

## 2019-11-05 NOTE — Telephone Encounter (Signed)
lmtcb for pt to further discuss symptoms.

## 2019-11-06 ENCOUNTER — Institutional Professional Consult (permissible substitution): Payer: Medicare Other | Admitting: Internal Medicine

## 2019-11-06 NOTE — Telephone Encounter (Signed)
Spoke with the pt's caregiver  He has only had sore throat x 6 days  He is having increased cough x 3-4 days  He says fine with him to just reschedule to when he is feeling better and not having these acute symptoms  Appt rescheduled

## 2019-11-12 ENCOUNTER — Encounter (HOSPITAL_COMMUNITY): Payer: Self-pay | Admitting: Internal Medicine

## 2019-11-12 ENCOUNTER — Emergency Department (HOSPITAL_COMMUNITY): Payer: Medicare Other

## 2019-11-12 ENCOUNTER — Inpatient Hospital Stay (HOSPITAL_COMMUNITY)
Admission: EM | Admit: 2019-11-12 | Discharge: 2019-11-30 | DRG: 871 | Disposition: A | Discharge status: Skilled Nursing Facility | Payer: Medicare Other | Source: Skilled Nursing Facility | Attending: Internal Medicine | Admitting: Internal Medicine

## 2019-11-12 DIAGNOSIS — K219 Gastro-esophageal reflux disease without esophagitis: Secondary | ICD-10-CM | POA: Diagnosis present

## 2019-11-12 DIAGNOSIS — F259 Schizoaffective disorder, unspecified: Secondary | ICD-10-CM | POA: Diagnosis present

## 2019-11-12 DIAGNOSIS — N184 Chronic kidney disease, stage 4 (severe): Secondary | ICD-10-CM | POA: Diagnosis not present

## 2019-11-12 DIAGNOSIS — I248 Other forms of acute ischemic heart disease: Secondary | ICD-10-CM | POA: Diagnosis present

## 2019-11-12 DIAGNOSIS — J9621 Acute and chronic respiratory failure with hypoxia: Secondary | ICD-10-CM | POA: Diagnosis present

## 2019-11-12 DIAGNOSIS — Z66 Do not resuscitate: Secondary | ICD-10-CM | POA: Diagnosis not present

## 2019-11-12 DIAGNOSIS — E870 Hyperosmolality and hypernatremia: Secondary | ICD-10-CM | POA: Diagnosis not present

## 2019-11-12 DIAGNOSIS — E860 Dehydration: Secondary | ICD-10-CM | POA: Diagnosis present

## 2019-11-12 DIAGNOSIS — I2510 Atherosclerotic heart disease of native coronary artery without angina pectoris: Secondary | ICD-10-CM | POA: Diagnosis present

## 2019-11-12 DIAGNOSIS — D649 Anemia, unspecified: Secondary | ICD-10-CM | POA: Diagnosis not present

## 2019-11-12 DIAGNOSIS — Z7982 Long term (current) use of aspirin: Secondary | ICD-10-CM

## 2019-11-12 DIAGNOSIS — E785 Hyperlipidemia, unspecified: Secondary | ICD-10-CM | POA: Diagnosis present

## 2019-11-12 DIAGNOSIS — Z87891 Personal history of nicotine dependence: Secondary | ICD-10-CM | POA: Diagnosis not present

## 2019-11-12 DIAGNOSIS — J8410 Pulmonary fibrosis, unspecified: Secondary | ICD-10-CM | POA: Diagnosis present

## 2019-11-12 DIAGNOSIS — Y92239 Unspecified place in hospital as the place of occurrence of the external cause: Secondary | ICD-10-CM | POA: Diagnosis not present

## 2019-11-12 DIAGNOSIS — I4891 Unspecified atrial fibrillation: Secondary | ICD-10-CM | POA: Diagnosis not present

## 2019-11-12 DIAGNOSIS — Z8249 Family history of ischemic heart disease and other diseases of the circulatory system: Secondary | ICD-10-CM

## 2019-11-12 DIAGNOSIS — N179 Acute kidney failure, unspecified: Secondary | ICD-10-CM | POA: Diagnosis not present

## 2019-11-12 DIAGNOSIS — I340 Nonrheumatic mitral (valve) insufficiency: Secondary | ICD-10-CM | POA: Diagnosis not present

## 2019-11-12 DIAGNOSIS — E1122 Type 2 diabetes mellitus with diabetic chronic kidney disease: Secondary | ICD-10-CM | POA: Diagnosis present

## 2019-11-12 DIAGNOSIS — U071 COVID-19: Secondary | ICD-10-CM | POA: Diagnosis not present

## 2019-11-12 DIAGNOSIS — J9601 Acute respiratory failure with hypoxia: Secondary | ICD-10-CM | POA: Diagnosis present

## 2019-11-12 DIAGNOSIS — Z833 Family history of diabetes mellitus: Secondary | ICD-10-CM

## 2019-11-12 DIAGNOSIS — I5032 Chronic diastolic (congestive) heart failure: Secondary | ICD-10-CM | POA: Diagnosis not present

## 2019-11-12 DIAGNOSIS — J440 Chronic obstructive pulmonary disease with acute lower respiratory infection: Secondary | ICD-10-CM | POA: Diagnosis present

## 2019-11-12 DIAGNOSIS — E876 Hypokalemia: Secondary | ICD-10-CM | POA: Diagnosis not present

## 2019-11-12 DIAGNOSIS — E871 Hypo-osmolality and hyponatremia: Secondary | ICD-10-CM | POA: Diagnosis not present

## 2019-11-12 DIAGNOSIS — I10 Essential (primary) hypertension: Secondary | ICD-10-CM | POA: Diagnosis present

## 2019-11-12 DIAGNOSIS — Z9981 Dependence on supplemental oxygen: Secondary | ICD-10-CM

## 2019-11-12 DIAGNOSIS — Z8701 Personal history of pneumonia (recurrent): Secondary | ICD-10-CM

## 2019-11-12 DIAGNOSIS — D631 Anemia in chronic kidney disease: Secondary | ICD-10-CM | POA: Diagnosis present

## 2019-11-12 DIAGNOSIS — Z951 Presence of aortocoronary bypass graft: Secondary | ICD-10-CM | POA: Diagnosis not present

## 2019-11-12 DIAGNOSIS — R0602 Shortness of breath: Secondary | ICD-10-CM

## 2019-11-12 DIAGNOSIS — A4189 Other specified sepsis: Principal | ICD-10-CM | POA: Diagnosis present

## 2019-11-12 DIAGNOSIS — J1282 Pneumonia due to coronavirus disease 2019: Secondary | ICD-10-CM | POA: Diagnosis not present

## 2019-11-12 DIAGNOSIS — I130 Hypertensive heart and chronic kidney disease with heart failure and stage 1 through stage 4 chronic kidney disease, or unspecified chronic kidney disease: Secondary | ICD-10-CM | POA: Diagnosis present

## 2019-11-12 DIAGNOSIS — R1310 Dysphagia, unspecified: Secondary | ICD-10-CM | POA: Diagnosis present

## 2019-11-12 DIAGNOSIS — F419 Anxiety disorder, unspecified: Secondary | ICD-10-CM | POA: Diagnosis present

## 2019-11-12 DIAGNOSIS — A419 Sepsis, unspecified organism: Secondary | ICD-10-CM | POA: Diagnosis present

## 2019-11-12 DIAGNOSIS — I480 Paroxysmal atrial fibrillation: Secondary | ICD-10-CM | POA: Diagnosis present

## 2019-11-12 DIAGNOSIS — R0902 Hypoxemia: Secondary | ICD-10-CM

## 2019-11-12 DIAGNOSIS — Z79899 Other long term (current) drug therapy: Secondary | ICD-10-CM

## 2019-11-12 DIAGNOSIS — T380X5A Adverse effect of glucocorticoids and synthetic analogues, initial encounter: Secondary | ICD-10-CM | POA: Diagnosis not present

## 2019-11-12 DIAGNOSIS — F329 Major depressive disorder, single episode, unspecified: Secondary | ICD-10-CM | POA: Diagnosis present

## 2019-11-12 DIAGNOSIS — R54 Age-related physical debility: Secondary | ICD-10-CM | POA: Diagnosis present

## 2019-11-12 LAB — POCT I-STAT EG7
Acid-base deficit: 5 mmol/L — ABNORMAL HIGH (ref 0.0–2.0)
Bicarbonate: 19.8 mmol/L — ABNORMAL LOW (ref 20.0–28.0)
Calcium, Ion: 1.03 mmol/L — ABNORMAL LOW (ref 1.15–1.40)
HCT: 28 % — ABNORMAL LOW (ref 39.0–52.0)
Hemoglobin: 9.5 g/dL — ABNORMAL LOW (ref 13.0–17.0)
O2 Saturation: 99 %
Potassium: 4.3 mmol/L (ref 3.5–5.1)
Sodium: 137 mmol/L (ref 135–145)
TCO2: 21 mmol/L — ABNORMAL LOW (ref 22–32)
pCO2, Ven: 35.4 mmHg — ABNORMAL LOW (ref 44.0–60.0)
pH, Ven: 7.356 (ref 7.250–7.430)
pO2, Ven: 139 mmHg — ABNORMAL HIGH (ref 32.0–45.0)

## 2019-11-12 LAB — CBC WITH DIFFERENTIAL/PLATELET
Abs Immature Granulocytes: 0.09 10*3/uL — ABNORMAL HIGH (ref 0.00–0.07)
Basophils Absolute: 0 10*3/uL (ref 0.0–0.1)
Basophils Relative: 0 %
Eosinophils Absolute: 0 10*3/uL (ref 0.0–0.5)
Eosinophils Relative: 0 %
HCT: 29.3 % — ABNORMAL LOW (ref 39.0–52.0)
Hemoglobin: 9.4 g/dL — ABNORMAL LOW (ref 13.0–17.0)
Immature Granulocytes: 2 %
Lymphocytes Relative: 17 %
Lymphs Abs: 0.9 10*3/uL (ref 0.7–4.0)
MCH: 29.2 pg (ref 26.0–34.0)
MCHC: 32.1 g/dL (ref 30.0–36.0)
MCV: 91 fL (ref 80.0–100.0)
Monocytes Absolute: 0.3 10*3/uL (ref 0.1–1.0)
Monocytes Relative: 7 %
Neutro Abs: 3.9 10*3/uL (ref 1.7–7.7)
Neutrophils Relative %: 74 %
Platelets: 200 10*3/uL (ref 150–400)
RBC: 3.22 MIL/uL — ABNORMAL LOW (ref 4.22–5.81)
RDW: 15.3 % (ref 11.5–15.5)
WBC: 5.2 10*3/uL (ref 4.0–10.5)
nRBC: 0 % (ref 0.0–0.2)

## 2019-11-12 LAB — URINALYSIS, ROUTINE W REFLEX MICROSCOPIC
Bilirubin Urine: NEGATIVE
Glucose, UA: NEGATIVE mg/dL
Hgb urine dipstick: NEGATIVE
Ketones, ur: NEGATIVE mg/dL
Leukocytes,Ua: NEGATIVE
Nitrite: NEGATIVE
Protein, ur: NEGATIVE mg/dL
Specific Gravity, Urine: 1.009 (ref 1.005–1.030)
pH: 5 (ref 5.0–8.0)

## 2019-11-12 LAB — COMPREHENSIVE METABOLIC PANEL
ALT: 17 U/L (ref 0–44)
AST: 25 U/L (ref 15–41)
Albumin: 3.3 g/dL — ABNORMAL LOW (ref 3.5–5.0)
Alkaline Phosphatase: 65 U/L (ref 38–126)
Anion gap: 17 — ABNORMAL HIGH (ref 5–15)
BUN: 67 mg/dL — ABNORMAL HIGH (ref 8–23)
CO2: 17 mmol/L — ABNORMAL LOW (ref 22–32)
Calcium: 8 mg/dL — ABNORMAL LOW (ref 8.9–10.3)
Chloride: 103 mmol/L (ref 98–111)
Creatinine, Ser: 3.08 mg/dL — ABNORMAL HIGH (ref 0.61–1.24)
GFR calc Af Amer: 21 mL/min — ABNORMAL LOW (ref >60)
GFR calc non Af Amer: 19 mL/min — ABNORMAL LOW (ref >60)
Glucose, Bld: 181 mg/dL — ABNORMAL HIGH (ref 70–99)
Potassium: 4.5 mmol/L (ref 3.5–5.1)
Sodium: 137 mmol/L (ref 135–145)
Total Bilirubin: 0.7 mg/dL (ref 0.3–1.2)
Total Protein: 6.7 g/dL (ref 6.5–8.1)

## 2019-11-12 LAB — PROTIME-INR
INR: 1 (ref 0.8–1.2)
Prothrombin Time: 13.2 seconds (ref 11.4–15.2)

## 2019-11-12 LAB — BRAIN NATRIURETIC PEPTIDE: B Natriuretic Peptide: 155.7 pg/mL — ABNORMAL HIGH (ref 0.0–100.0)

## 2019-11-12 LAB — PROCALCITONIN: Procalcitonin: <0.1 ng/mL

## 2019-11-12 LAB — TROPONIN I (HIGH SENSITIVITY)
Troponin I (High Sensitivity): 54 ng/L — ABNORMAL HIGH (ref <18)
Troponin I (High Sensitivity): 61 ng/L — ABNORMAL HIGH (ref <18)

## 2019-11-12 LAB — APTT: aPTT: 33 seconds (ref 24–36)

## 2019-11-12 LAB — LACTIC ACID, PLASMA
Lactic Acid, Venous: 2.1 mmol/L (ref 0.5–1.9)
Lactic Acid, Venous: 1.1 mmol/L (ref 0.5–1.9)

## 2019-11-12 LAB — SARS CORONAVIRUS 2 BY RT PCR (HOSPITAL ORDER, PERFORMED IN ~~LOC~~ HOSPITAL LAB): SARS Coronavirus 2: POSITIVE — AB

## 2019-11-12 MED ORDER — CLONAZEPAM 0.5 MG PO TABS
0.5000 mg | ORAL_TABLET | Freq: Two times a day (BID) | ORAL | Status: DC
  Administered 2019-11-13 – 2019-11-21 (×17): 0.5 mg via ORAL
  Filled 2019-11-12 (×17): qty 1

## 2019-11-12 MED ORDER — SODIUM CHLORIDE 0.9 % IV SOLN
100.0000 mg | Freq: Every day | INTRAVENOUS | Status: AC
  Administered 2019-11-13 – 2019-11-16 (×4): 100 mg via INTRAVENOUS
  Filled 2019-11-12 (×6): qty 20

## 2019-11-12 MED ORDER — ACETAMINOPHEN 325 MG PO TABS
650.0000 mg | ORAL_TABLET | Freq: Once | ORAL | Status: AC
  Administered 2019-11-12: 650 mg via ORAL
  Filled 2019-11-12: qty 2

## 2019-11-12 MED ORDER — FERROUS SULFATE 325 (65 FE) MG PO TABS
325.0000 mg | ORAL_TABLET | Freq: Two times a day (BID) | ORAL | Status: DC
  Administered 2019-11-13 – 2019-11-30 (×35): 325 mg via ORAL
  Filled 2019-11-12 (×35): qty 1

## 2019-11-12 MED ORDER — SODIUM CHLORIDE 0.9 % IV SOLN
200.0000 mg | Freq: Once | INTRAVENOUS | Status: AC
  Administered 2019-11-12: 200 mg via INTRAVENOUS
  Filled 2019-11-12: qty 40

## 2019-11-12 MED ORDER — PANTOPRAZOLE SODIUM 40 MG PO TBEC
40.0000 mg | DELAYED_RELEASE_TABLET | Freq: Every day | ORAL | Status: DC
  Administered 2019-11-13 – 2019-11-30 (×18): 40 mg via ORAL
  Filled 2019-11-12 (×18): qty 1

## 2019-11-12 MED ORDER — ENOXAPARIN SODIUM 30 MG/0.3ML ~~LOC~~ SOLN: 30.0000 mg | Freq: Every day | SUBCUTANEOUS | Status: DC

## 2019-11-12 MED ORDER — SODIUM CHLORIDE 0.9 % IV SOLN: 2.0000 g | Freq: Once | INTRAVENOUS | Status: DC

## 2019-11-12 MED ORDER — TAMSULOSIN HCL 0.4 MG PO CAPS
0.4000 mg | ORAL_CAPSULE | Freq: Every day | ORAL | Status: DC
  Administered 2019-11-13 – 2019-11-30 (×18): 0.4 mg via ORAL
  Filled 2019-11-12 (×18): qty 1

## 2019-11-12 MED ORDER — FLUTICASONE FUROATE-VILANTEROL 200-25 MCG/INH IN AEPB
1.0000 | INHALATION_SPRAY | Freq: Every day | RESPIRATORY_TRACT | Status: DC
  Administered 2019-11-13 – 2019-11-30 (×18): 1 via RESPIRATORY_TRACT
  Filled 2019-11-12 (×3): qty 28

## 2019-11-12 MED ORDER — SODIUM CHLORIDE 0.9 % IV SOLN: 500.0000 mg | Freq: Once | INTRAVENOUS | Status: DC

## 2019-11-12 MED ORDER — ASPIRIN EC 81 MG PO TBEC
81.0000 mg | DELAYED_RELEASE_TABLET | Freq: Every day | ORAL | Status: DC
  Administered 2019-11-13 – 2019-11-30 (×18): 81 mg via ORAL
  Filled 2019-11-12 (×18): qty 1

## 2019-11-12 MED ORDER — ATENOLOL 25 MG PO TABS: 25.0000 mg | ORAL_TABLET | Freq: Every day | ORAL | Status: DC

## 2019-11-12 MED ORDER — VITAMIN B-12 1000 MCG PO TABS
500.0000 ug | ORAL_TABLET | Freq: Two times a day (BID) | ORAL | Status: DC
  Administered 2019-11-13 – 2019-11-30 (×35): 500 ug via ORAL
  Filled 2019-11-12 (×35): qty 1

## 2019-11-12 MED ORDER — ATORVASTATIN CALCIUM 10 MG PO TABS
20.0000 mg | ORAL_TABLET | Freq: Every day | ORAL | Status: DC
  Administered 2019-11-13 – 2019-11-29 (×18): 20 mg via ORAL
  Filled 2019-11-12 (×18): qty 2

## 2019-11-12 MED ORDER — SODIUM CHLORIDE 0.9 % IV SOLN: INTRAVENOUS | Status: AC

## 2019-11-12 MED ORDER — ONDANSETRON HCL 4 MG/2ML IJ SOLN: 4.0000 mg | Freq: Four times a day (QID) | INTRAMUSCULAR | Status: DC | PRN

## 2019-11-12 MED ORDER — BENZTROPINE MESYLATE 1 MG PO TABS
0.5000 mg | ORAL_TABLET | Freq: Two times a day (BID) | ORAL | Status: DC
  Administered 2019-11-13 – 2019-11-23 (×21): 0.5 mg via ORAL
  Filled 2019-11-12 (×21): qty 1

## 2019-11-12 MED ORDER — ALBUTEROL SULFATE (2.5 MG/3ML) 0.083% IN NEBU: 2.5000 mg | INHALATION_SOLUTION | Freq: Every day | RESPIRATORY_TRACT | Status: DC

## 2019-11-12 MED ORDER — UMECLIDINIUM BROMIDE 62.5 MCG/INH IN AEPB
1.0000 | INHALATION_SPRAY | Freq: Every day | RESPIRATORY_TRACT | Status: DC
  Administered 2019-11-13 – 2019-11-30 (×17): 1 via RESPIRATORY_TRACT
  Filled 2019-11-12 (×3): qty 7

## 2019-11-12 MED ORDER — ONDANSETRON HCL 4 MG PO TABS: 4.0000 mg | ORAL_TABLET | Freq: Four times a day (QID) | ORAL | Status: DC | PRN

## 2019-11-12 MED ORDER — SODIUM CHLORIDE 0.9 % IV BOLUS
2000.0000 mL | Freq: Once | INTRAVENOUS | Status: AC
  Administered 2019-11-12: 2000 mL via INTRAVENOUS

## 2019-11-12 NOTE — ED Triage Notes (Signed)
Pt arrived via [facility] from [facility] (ALF) stated patient had a productive cough temp 101.1. Coughing and weakness x3days. End Tidal 20-25, CBG-112mg /dl, BP 136/70,P-104,R-36, SPO2 95 on 2L. Has a history of COPD and Pneumonia.

## 2019-11-12 NOTE — ED Provider Notes (Signed)
[facility] EMERGENCY DEPARTMENT Provider Note   CSN: [id number] Arrival date & time: 11/12/19  1853     History Chief Complaint  Patient presents with  . Shortness of Breath    Robert Little is a 79 y.o. male history of pulmonary fibrosis on 2 L nasal cannula at baseline, coronary artery disease, hypertension, schizoaffective disorder, presented to the emergency department by EMS from assisted living facility [facility] with concern for fever, cough, myalgia.  Patient has a history of both COPD and pneumonia in the past.  He has been tested negative for Covid as recently as March of this year.  Is unclear whether he received Covid vaccines.  He reportedly has been having fever for about 2 to 3 days.  Patient himself reports has had a productive cough.  He says he is a very poor appetite.  He denies nausea or vomiting or diarrhea.  He cannot provide much further history  EMS reports the patient had a temperature of 101.14F on their arrival, HR 104, satting 88% on room air, improved to 95% on 2L [place] (patient's baseline).  Patient unable to provide further reliable history.    HPI     Past Medical History:  Diagnosis Date  . Anemia   . Coronary artery disease   . GERD (gastroesophageal reflux disease)   . Hypertension   . Pulmonary fibrosis ([place])   . Schizo affective schizophrenia [facility])     Patient Active Problem List   Diagnosis Date Noted  . Sepsis due to COVID-19 ([place]) 11/12/2019  . Hypoglycemia 10/28/2019  . Hyponatremia 10/28/2019  . Acute respiratory failure with hypoxia ([place]) 09/17/2019  . Positive D dimer 09/17/2019  . Acute on chronic diastolic CHF (congestive heart failure) ([place]) 09/17/2019  . CHF (congestive heart failure) ([place]) 09/17/2019  . Acute hypoxemic respiratory failure ([place]) 09/17/2019  . Syncope 09/05/2019  . Hypocalcemia 09/05/2019  . Hypomagnesemia 09/05/2019  . CKD (chronic kidney disease), stage IV ([place]) 09/05/2019  . Acute  urinary retention 05/18/2018  . UTI (urinary tract infection) 05/15/2018  . Sepsis ([place]) 05/15/2018  . Hypokalemia 05/15/2018  . Acute renal failure superimposed on stage 4 chronic kidney disease ([place]) 05/15/2018  . HLD (hyperlipidemia) 05/15/2018  . Coronary artery disease   . Hypertension   . Pulmonary fibrosis ([place])   . Schizo affective schizophrenia ([place])   . Normocytic anemia     Past Surgical History:  Procedure Laterality Date  . CORONARY ARTERY BYPASS GRAFT         Family History  Problem Relation Age of Onset  . Diabetes Mellitus II Mother   . Diabetes Mellitus II Maternal Grandmother   . Heart disease Maternal Grandmother     Social History   Tobacco Use  . Smoking status: Former Smoker    Types: Cigarettes  . Smokeless tobacco: Never Used  Substance Use Topics  . Alcohol use: Not Currently  . Drug use: Not Currently    Home Medications Prior to Admission medications   Medication Sig Start Date End Date Taking? Authorizing Provider  acetaminophen (TYLENOL) 325 MG tablet Take 2 tablets (650 mg total) by mouth every 6 (six) hours as needed for mild pain (or Fever >/= 101). Patient taking differently: Take 325-650 mg by mouth at bedtime.  05/18/18  Yes Debbe Odea, MD  albuterol (VENTOLIN HFA) 108 (90 Base) MCG/ACT inhaler Inhale 2 puffs into the lungs every 6 (six) hours as needed for wheezing or shortness of breath. Patient taking differently:  Inhale 2 puffs into the lungs at bedtime.  09/21/19  Yes Mercy Riding, MD  aspirin EC 81 MG EC tablet Take 1 tablet (81 mg total) by mouth daily. 09/22/19  Yes Mercy Riding, MD  atenolol (TENORMIN) 25 MG tablet Take 25 mg by mouth daily.   Yes [provider]  atorvastatin (LIPITOR) 20 MG tablet Take 20 mg by mouth at bedtime.   Yes [provider]  benztropine (COGENTIN) 0.5 MG tablet Take 0.5 mg by mouth 2 (two) times daily.   Yes [provider]  Calcium Carbonate-Vitamin D (CALCIUM  600/VITAMIN D) 600-400 MG-UNIT chew tablet Chew 1 tablet by mouth 2 (two) times daily.   Yes [provider]  clonazePAM (KLONOPIN) 0.5 MG tablet Take 0.5 mg by mouth 2 (two) times daily.    Yes [provider]  Dexlansoprazole (DEXILANT) 30 MG capsule Take 30 mg by mouth daily.   Yes [provider]  ferrous sulfate 325 (65 FE) MG tablet Take 1 tablet (325 mg total) by mouth 2 (two) times daily with a meal. 09/21/19  Yes Gonfa, Taye T, MD  fluticasone furoate-vilanterol (BREO ELLIPTA) 200-25 MCG/INH AEPB Inhale 1 puff into the lungs daily. 09/22/19  Yes Mercy Riding, MD  furosemide (LASIX) 40 MG tablet Take 1 tablet (40 mg total) by mouth daily. 09/22/19  Yes Mercy Riding, MD  ipratropium (ATROVENT) 0.03 % nasal spray Place 1 spray into the nose daily as needed for congestion. 11/02/19  Yes [provider]  Multiple Vitamin (MULTIVITAMIN WITH MINERALS) TABS tablet Take 1 tablet by mouth daily.   Yes [provider]  polyethylene glycol powder (MIRALAX) 17 GM/SCOOP powder Take 17 g by mouth 2 (two) times daily as needed for moderate constipation. 09/21/19  Yes Mercy Riding, MD  potassium chloride SA (KLOR-CON) 20 MEQ tablet Take 1 tablet (20 mEq total) by mouth daily. 09/22/19  Yes Mercy Riding, MD  tamsulosin (FLOMAX) 0.4 MG CAPS capsule Take 1 capsule (0.4 mg total) by mouth daily after supper. Patient taking differently: Take 0.4 mg by mouth daily.  05/18/18  Yes Debbe Odea, MD  umeclidinium bromide (INCRUSE ELLIPTA) 62.5 MCG/INH AEPB Inhale 1 puff into the lungs daily. 09/22/19  Yes Mercy Riding, MD  vitamin B-12 (CYANOCOBALAMIN) 500 MCG tablet Take 500 mcg by mouth 2 (two) times daily.   Yes [provider]  calcium gluconate 500 MG tablet Take 1 tablet (500 mg total) by mouth 2 (two) times daily. Patient not taking: Reported on 10/28/2019 09/07/19   Patrecia Pour, MD  paliperidone (INVEGA SUSTENNA) 156 MG/ML SUSY injection Inject 156 mg into  the muscle every 30 (thirty) days.    [provider]  senna-docusate (SENOKOT-S) 8.6-50 MG tablet Take 1 tablet by mouth 2 (two) times daily between meals as needed for mild constipation. Patient not taking: Reported on 10/28/2019 09/21/19   Mercy Riding, MD    Allergies    Patient has no known allergies.  Review of Systems   Review of Systems  Unable to perform ROS: Dementia (level 5 caveat)    Physical Exam Updated Vital Signs BP (!) 145/68   Pulse 80   Temp (!) 102.8 F (39.3 C) (Rectal)   Resp (!) 25   SpO2 97%   Physical Exam Vitals and nursing note reviewed.  Constitutional:      Appearance: He is well-developed.     Comments: Pleasantly demented  HENT:  Head: Normocephalic and atraumatic.  Eyes:     Conjunctiva/sclera: Conjunctivae normal.  Cardiovascular:     Rate and Rhythm: Normal rate and regular rhythm.  Pulmonary:     Effort: Pulmonary effort is normal. No respiratory distress.     Breath sounds: No wheezing.     Comments: Crackles in lung bases 95% on 2L [place] Speaking in full sentences Abdominal:     Palpations: Abdomen is soft.     Tenderness: There is no abdominal tenderness.  Musculoskeletal:     Cervical back: Neck supple.  Skin:    General: Skin is warm and dry.  Neurological:     Mental Status: He is alert.     ED Results / Procedures / Treatments   Labs (all labs ordered are listed, but only abnormal results are displayed) Labs Reviewed  SARS CORONAVIRUS 2 BY RT PCR ([place] [place] [facility]) - Abnormal; Notable for the following components:      Result Value   SARS Coronavirus 2 POSITIVE (*)    All other components within normal limits  LACTIC ACID, PLASMA - Abnormal; Notable for the following components:   Lactic Acid, Venous 2.1 (*)    All other components within normal limits  COMPREHENSIVE METABOLIC PANEL - Abnormal; Notable for the following components:   CO2 17 (*)    Glucose, Bld 181  (*)    BUN 67 (*)    Creatinine, Ser 3.08 (*)    Calcium 8.0 (*)    Albumin 3.3 (*)    GFR calc non Af Amer 19 (*)    GFR calc Af Amer 21 (*)    Anion gap 17 (*)    All other components within normal limits  CBC WITH DIFFERENTIAL/PLATELET - Abnormal; Notable for the following components:   RBC 3.22 (*)    Hemoglobin 9.4 (*)    HCT 29.3 (*)    Abs Immature Granulocytes 0.09 (*)    All other components within normal limits  BRAIN NATRIURETIC PEPTIDE - Abnormal; Notable for the following components:   B Natriuretic Peptide 155.7 (*)    All other components within normal limits  POCT I-STAT EG7 - Abnormal; Notable for the following components:   pCO2, Ven 35.4 (*)    pO2, Ven 139.0 (*)    Bicarbonate 19.8 (*)    TCO2 21 (*)    Acid-base deficit 5.0 (*)    Calcium, Ion 1.03 (*)    HCT 28.0 (*)    Hemoglobin 9.5 (*)    All other components within normal limits  TROPONIN I (HIGH SENSITIVITY) - Abnormal; Notable for the following components:   Troponin I (High Sensitivity) 54 (*)    All other components within normal limits  TROPONIN I (HIGH SENSITIVITY) - Abnormal; Notable for the following components:   Troponin I (High Sensitivity) 61 (*)    All other components within normal limits  CULTURE, BLOOD (ROUTINE X 2)  CULTURE, BLOOD (ROUTINE X 2)  URINE CULTURE  LACTIC ACID, PLASMA  APTT  PROTIME-INR  URINALYSIS, ROUTINE W REFLEX MICROSCOPIC  PROCALCITONIN  FIBRINOGEN  C-REACTIVE PROTEIN  D-DIMER, QUANTITATIVE (NOT AT [facility])  NA AND K (SODIUM & POTASSIUM), RAND UR  CREATININE, URINE, RANDOM  I-STAT VENOUS BLOOD GAS, ED    EKG None  Radiology DG Chest Port 1 View  Result Date: 11/12/2019 CLINICAL DATA:  Shortness of breath and sepsis. Fever. Productive cough. EXAM: PORTABLE CHEST 1 VIEW COMPARISON:  10/28/2019.  09/19/2019. FINDINGS: The  patient has a history of pulmonary fibrosis and emphysema. Chronic lung markings appear similar allowing for technical differences. Upper  lobe emphysema as seen previously. No sign of dense consolidation, collapse or effusion. Previous median sternotomy and CABG. Aortic atherosclerosis. No acute bone finding. IMPRESSION: Portable film with the poor inspiration. Chronic fibrotic lung disease and upper lung emphysema. No sign of acute infiltrate, collapse or effusion. Electronically Signed   By: Nelson Chimes M.D.   On: 11/12/2019 19:37    Procedures .Critical Care Performed by: Wyvonnia Dusky, MD Authorized by: Wyvonnia Dusky, MD   Critical care provider statement:    Critical care time (minutes):  35   Critical care was necessary to treat or prevent imminent or life-threatening deterioration of the following conditions:  Respiratory failure   Critical care was time spent personally by me on the following activities:  Discussions with consultants, evaluation of patient's response to treatment, examination of patient, ordering and performing treatments and interventions, ordering and review of laboratory studies, ordering and review of radiographic studies, pulse oximetry, re-evaluation of patient's condition, obtaining history from patient or surrogate and review of old charts Comments:     Supplemental O2 for hypoxia   (including critical care time)  Medications Ordered in ED Medications  remdesivir 200 mg in sodium chloride 0.9% 250 mL IVPB (200 mg Intravenous New Bag/Given 11/12/19 2337)    Followed by  remdesivir 100 mg in sodium chloride 0.9 % 100 mL IVPB (has no administration in time range)  sodium chloride 0.9 % bolus 2,000 mL (0 mLs Intravenous Stopped 11/12/19 2244)  acetaminophen (TYLENOL) tablet 650 mg (650 mg Oral Given 11/12/19 1928)    ED Course  I have reviewed the triage vital signs and the nursing notes.  Pertinent labs & imaging results that were available during my care of the patient were reviewed by me and considered in my medical decision making (see chart for details).  This patient presents from  assisted living facility with concern for productive cough, fever, poor appetite for several days.   This involves an extensive number of treatment options, and is a complaint that carries with it a high risk of complications and morbidity.  The differential diagnosis includes sepsis PNA vs viral URI including COVID -19 vs other  Mildly hypoxic here, normally on 2L [place], here requiring 3L [place] to keep O2 above 90%.  He has pulmonary fibrosis and is therefore at higher risk for hypoxia and respiratory failure from acute pulmonary insult.  Doubtful of PE or ACS in this clinical setting.  Fever raises concern for infection  Belvin Gauss was evaluated in Emergency Department on 11/12/2019 for the symptoms described in the history of present illness. He was evaluated in the context of the global COVID-19 pandemic, which necessitated consideration that the patient might be at risk for infection with the SARS-CoV-2 virus that causes COVID-19. Institutional protocols and algorithms that pertain to the evaluation of patients at risk for COVID-19 are in a state of rapid change based on information released by regulatory bodies including the CDC and federal and state organizations. These policies and algorithms were followed during the patient's care in the ED.   I ordered, reviewed, and interpreted labs, which included sepsis workup, blood culture,s COVID panel I ordered medication IV fluids (approx 30 cc/kg or 2L IVF today) and IV remdesivir for sepsis protocol and Covid management I ordered imaging studies which included dg chest xray  I independently visualized and interpreted imaging which showed  emphysematous changes and the monitor tracing which showed NSR Previous records obtained and reviewed showing recent hospitalization and discharge in April 2021 for hypoglycemia felt to be iatrogenic  After the interventions stated above, I reevaluated the patient and found no deterioration in his respiratory or mental  status.  I felt that he was less likely suffering from bacterial infection in the setting of a COVID positive test result, no leukocytosis, and normal procalcitonin, and therefore discontinued his antibiotic therapy.   Clinical Course as of Nov 12 2338  Mon Nov 12, 2019  2050 Trop 54, but below baseline.    [MT]  2050 Cr 3, up from Cr 2 at prior check.  BUN 67 suggestive of prerenal AKI.  Giving 30 cc/kg bolus now.  UA negative.     [MT]  2056 Suspect likely pulmonary source of infection, still awaiting COVID swab, but ordered 1 round ceftriaxone and azithromycin.  Less likely intraabdominal infection, including biliary disease, diverticulitis, or abscess, with unremarkable LFT's, no abdominal tenderness, no leukocytosis.   [MT]  2125 SARS Coronavirus 2(!): POSITIVE [MT]  2130 Discontinued antibiotics with positive COVID.  With a normal procalcitonin and normal WBC, this seems much less likely a bacterial infection.  I'll admit to hospitalist for his AKI and given his hx of pulmonary fibrosis, he may benefit from observation overnight   [MT]  2131 Satting 98% on 3L [place]   [MT]  2141 Signed out to hospitalist   [MT]    Clinical Course User Index [MT] Hazelgrace Bonham, Carola Rhine, MD    Final Clinical Impression(s) / ED Diagnoses Final diagnoses:  COVID-19  Hypoxia  AKI (acute kidney injury) [facility])    Rx / [place] Orders ED Discharge Orders    None       Wyvonnia Dusky, MD 11/12/19 2340

## 2019-11-12 NOTE — H&P (Signed)
History and Physical    Robert Little VCB:[id number] DOB: December 26, 1940 DOA: 11/12/2019  PCP: [name]  Patient coming from: Assisted living facility.  History obtained from patient's legal guardian Robert Little and the ER physician and previous records patient is confused to give history.  Chief Complaint: Cough shortness of breath and fever.  HPI: Robert Little is a 79 y.o. male with history of his affective disorder, CAD, pulmonary fibrosis on home oxygen usually 2 L anemia hypertension diabetes mellitus recently admitted for hypoglycemia with use of sulfonylureas in the setting of chronic kidney disease was brought to the ER after patient has been having productive cough over the last 3 days feeling weak and tired not eating well.  Has not had any nausea vomiting or diarrhea.  Did not complain of any abdominal pain.  ED Course: In the ER patient was febrile tachycardic with temperature 102 F lactic acid was initially mildly elevated at 2.1 with lab work showing WBC of 5.2 hemoglobin 9.4 platelets 200 procalcitonin less than 0.1 chest x-ray showing chronic features but nothing acute and UA unremarkable.  Covid test came back positive.  Initially was started on antibiotics and fluid bolus for sepsis protocol.  Once Covid was positive patient was started on IV remdesivir.  At this time patient is using around 3 L oxygen baseline is around 2 L.  Patient's creatinine has increased from 2-3.  Likely from prerenal cause.  Patient admitted for sepsis likely from Covid with dehydration.  At the time of my exam patient alert awake but does not know why he is coming in.  Review of Systems: As per HPI, rest all negative.   Past Medical History:  Diagnosis Date  . Anemia   . Coronary artery disease   . GERD (gastroesophageal reflux disease)   . Hypertension   . Pulmonary fibrosis ([place])   . Schizo affective schizophrenia [facility])     Past Surgical History:  Procedure Laterality  Date  . CORONARY ARTERY BYPASS GRAFT       reports that he has quit smoking. His smoking use included cigarettes. He has never used smokeless tobacco. He reports previous alcohol use. He reports previous drug use.  No Known Allergies  Family History  Problem Relation Age of Onset  . Diabetes Mellitus II Mother   . Diabetes Mellitus II Maternal Grandmother   . Heart disease Maternal Grandmother     Prior to Admission medications   Medication Sig Start Date End Date Taking? Authorizing Provider  acetaminophen (TYLENOL) 325 MG tablet Take 2 tablets (650 mg total) by mouth every 6 (six) hours as needed for mild pain (or Fever >/= 101). Patient taking differently: Take 325-650 mg by mouth at bedtime.  05/18/18  Yes Debbe Odea, MD  albuterol (VENTOLIN HFA) 108 (90 Base) MCG/ACT inhaler Inhale 2 puffs into the lungs every 6 (six) hours as needed for wheezing or shortness of breath. Patient taking differently: Inhale 2 puffs into the lungs at bedtime.  09/21/19  Yes Mercy Riding, MD  aspirin EC 81 MG EC tablet Take 1 tablet (81 mg total) by mouth daily. 09/22/19  Yes Mercy Riding, MD  atenolol (TENORMIN) 25 MG tablet Take 25 mg by mouth daily.   Yes [provider]  atorvastatin (LIPITOR) 20 MG tablet Take 20 mg by mouth at bedtime.   Yes [provider]  benztropine (COGENTIN) 0.5 MG tablet Take 0.5 mg by mouth 2 (two) times daily.   Yes  [provider]  Calcium Carbonate-Vitamin D (CALCIUM 600/VITAMIN D) 600-400 MG-UNIT chew tablet Chew 1 tablet by mouth 2 (two) times daily.   Yes [provider]  clonazePAM (KLONOPIN) 0.5 MG tablet Take 0.5 mg by mouth 2 (two) times daily.    Yes [provider]  Dexlansoprazole (DEXILANT) 30 MG capsule Take 30 mg by mouth daily.   Yes [provider]  ferrous sulfate 325 (65 FE) MG tablet Take 1 tablet (325 mg total) by mouth 2 (two) times daily with a meal. 09/21/19  Yes Gonfa, Taye T, MD  fluticasone  furoate-vilanterol (BREO ELLIPTA) 200-25 MCG/INH AEPB Inhale 1 puff into the lungs daily. 09/22/19  Yes Mercy Riding, MD  furosemide (LASIX) 40 MG tablet Take 1 tablet (40 mg total) by mouth daily. 09/22/19  Yes Mercy Riding, MD  ipratropium (ATROVENT) 0.03 % nasal spray Place 1 spray into the nose daily as needed for congestion. 11/02/19  Yes [provider]  Multiple Vitamin (MULTIVITAMIN WITH MINERALS) TABS tablet Take 1 tablet by mouth daily.   Yes [provider]  polyethylene glycol powder (MIRALAX) 17 GM/SCOOP powder Take 17 g by mouth 2 (two) times daily as needed for moderate constipation. 09/21/19  Yes Mercy Riding, MD  potassium chloride SA (KLOR-CON) 20 MEQ tablet Take 1 tablet (20 mEq total) by mouth daily. 09/22/19  Yes Mercy Riding, MD  tamsulosin (FLOMAX) 0.4 MG CAPS capsule Take 1 capsule (0.4 mg total) by mouth daily after supper. Patient taking differently: Take 0.4 mg by mouth daily.  05/18/18  Yes Debbe Odea, MD  umeclidinium bromide (INCRUSE ELLIPTA) 62.5 MCG/INH AEPB Inhale 1 puff into the lungs daily. 09/22/19  Yes Mercy Riding, MD  vitamin B-12 (CYANOCOBALAMIN) 500 MCG tablet Take 500 mcg by mouth 2 (two) times daily.   Yes [provider]  calcium gluconate 500 MG tablet Take 1 tablet (500 mg total) by mouth 2 (two) times daily. Patient not taking: Reported on 10/28/2019 09/07/19   Patrecia Pour, MD  paliperidone (INVEGA SUSTENNA) 156 MG/ML SUSY injection Inject 156 mg into the muscle every 30 (thirty) days.    [provider]  senna-docusate (SENOKOT-S) 8.6-50 MG tablet Take 1 tablet by mouth 2 (two) times daily between meals as needed for mild constipation. Patient not taking: Reported on 10/28/2019 09/21/19   Mercy Riding, MD    Physical Exam: Constitutional: Moderately built and nourished. Vitals:   11/12/19 2300 11/12/19 2315 11/12/19 2330 11/12/19 2346  BP: 140/67 134/74 (!) 145/68   Pulse: 89 91 80   Resp:      Temp:    97.6  F (36.4 C)  TempSrc:    Oral  SpO2: 96% 96% 97%    Eyes: Anicteric no pallor. ENMT: No discharge from the ears eyes nose or mouth. Neck: No mass felt.  No neck rigidity. Respiratory: No rhonchi or crepitations. Cardiovascular: S1-S2 heard. Abdomen: Soft nontender bowel sounds present. Musculoskeletal: No edema. Skin: No rash. Neurologic: Alert awake oriented to name and place.  Moves all extremities. Psychiatric: Oriented to his name and place.   Labs on Admission: I have personally reviewed following labs and imaging studies  CBC: Recent Labs  Lab 11/12/19 1916 11/12/19 1939  WBC 5.2  --   NEUTROABS 3.9  --   HGB 9.4* 9.5*  HCT 29.3* 28.0*  MCV 91.0  --   PLT 200  --    Basic Metabolic Panel: Recent Labs  Lab 11/12/19  1916 11/12/19 1939  NA 137 137  K 4.5 4.3  CL 103  --   CO2 17*  --   GLUCOSE 181*  --   BUN 67*  --   CREATININE 3.08*  --   CALCIUM 8.0*  --    GFR: CrCl cannot be calculated (Unknown ideal weight.). Liver Function Tests: Recent Labs  Lab 11/12/19 1916  AST 25  ALT 17  ALKPHOS 65  BILITOT 0.7  PROT 6.7  ALBUMIN 3.3*   No results for input(s): LIPASE, AMYLASE in the last 168 hours. No results for input(s): AMMONIA in the last 168 hours. Coagulation Profile: Recent Labs  Lab 11/12/19 1916  INR 1.0   Cardiac Enzymes: No results for input(s): CKTOTAL, CKMB, CKMBINDEX, TROPONINI in the last 168 hours. BNP (last 3 results) No results for input(s): PROBNP in the last 8760 hours. HbA1C: No results for input(s): HGBA1C in the last 72 hours. CBG: No results for input(s): GLUCAP in the last 168 hours. Lipid Profile: No results for input(s): CHOL, HDL, LDLCALC, TRIG, CHOLHDL, LDLDIRECT in the last 72 hours. Thyroid Function Tests: No results for input(s): TSH, T4TOTAL, FREET4, T3FREE, THYROIDAB in the last 72 hours. Anemia Panel: No results for input(s): VITAMINB12, FOLATE, FERRITIN, TIBC, IRON, RETICCTPCT in the last 72 hours.  Urine analysis:    Component Value Date/Time   COLORURINE YELLOW 11/12/2019 1916   APPEARANCEUR CLEAR 11/12/2019 1916   LABSPEC 1.009 11/12/2019 1916   PHURINE 5.0 11/12/2019 1916   GLUCOSEU NEGATIVE 11/12/2019 1916   HGBUR NEGATIVE 11/12/2019 1916   BILIRUBINUR NEGATIVE 11/12/2019 [place] 11/12/2019 1916   PROTEINUR NEGATIVE 11/12/2019 1916   NITRITE NEGATIVE 11/12/2019 1916   LEUKOCYTESUR NEGATIVE 11/12/2019 1916   Sepsis Labs: @LABRCNTIP (procalcitonin:4,lacticidven:4) ) Recent Results (from the past 240 hour(s))  SARS Coronavirus 2 by RT PCR (hospital order, performed in [place] hospital lab) Nasopharyngeal Nasopharyngeal Swab     Status: Abnormal   Collection Time: 11/12/19  7:16 PM   Specimen: Nasopharyngeal Swab  Result Value Ref Range Status   SARS Coronavirus 2 POSITIVE (A) NEGATIVE Final    Comment: RESULT CALLED TO, READ BACK BY AND VERIFIED WITH: T KELLY RN 11/12/19 2119 JDW (NOTE) SARS-CoV-2 target nucleic acids are DETECTED SARS-CoV-2 RNA is generally detectable in upper respiratory specimens  during the acute phase of infection.  Positive results are indicative  of the presence of the identified virus, but do not rule out bacterial infection or co-infection with other pathogens not detected by the test.  Clinical correlation with patient history and  other diagnostic information is necessary to determine patient infection status.  The expected result is negative. Fact Sheet for Patients:   StrictlyIdeas.no  Fact Sheet for Healthcare Providers:   BankingDealers.co.za   This test is not yet approved or cleared by the [place] FDA and  has been authorized for detection and/or diagnosis of SARS-CoV-2 by FDA under an Emergency Use Authorization (EUA).  This EUA will remain in effect (meaning this test can be used)  for the duration of  the COVID-19 declaration under Section 564(b)(1) of the  Act, 21 U.S.C. section 360-bbb-3(b)(1), unless the authorization is terminated or revoked sooner. Performed at [facility], [place] [address], [place], [place] [address]      Radiological Exams on Admission: DG Chest Port 1 View  Result Date: 11/12/2019 CLINICAL DATA:  Shortness of breath and sepsis. Fever. Productive cough. EXAM: PORTABLE CHEST 1 VIEW COMPARISON:  10/28/2019.  09/19/2019. FINDINGS: The patient has a history of pulmonary fibrosis and emphysema. Chronic lung markings appear similar allowing for technical differences. Upper lobe emphysema as seen previously. No sign of dense consolidation, collapse or effusion. Previous median sternotomy and CABG. Aortic atherosclerosis. No acute bone finding. IMPRESSION: Portable film with the poor inspiration. Chronic fibrotic lung disease and upper lung emphysema. No sign of acute infiltrate, collapse or effusion. Electronically Signed   By: Nelson Chimes M.D.   On: 11/12/2019 19:37     Assessment/Plan Principal Problem:   Sepsis due to COVID-19 [facility]) Active Problems:   Pulmonary fibrosis (HCC)   Sepsis ([place])   AKI (acute kidney injury) ([place])   CKD (chronic kidney disease), stage IV (HCC)   Acute respiratory failure with hypoxia (HCC)   CHF (congestive heart failure) ([place])    1. Sepsis picture on presentation likely from Covid infection for which patient has been started on remdesivir.  Chest x-ray does not show any new infiltrates.  Will check inflammatory markers if elevated will start Decadron.  Follow blood cultures and urine cultures. 2. Acute on chronic disease stage III likely from poor oral intake as discussed with legal guardian.  As per the legal guardian patient has not been eating well for last 3 to 4 days.  Gently hydrate and follow metabolic panel.  Will hold Lasix for now. 3. Diabetes mellitus type 2 we will keep patient on very sensitive sliding scale coverage due to worsening renal function.  Patient admitted for  hypoglycemia. 4. History of CAD status post CABG no complaint of any chest pain.  Troponins are pending.  Patient is on beta-blocker aspirin and statins. 5. History of pulmonary fibrosis on 2 L home oxygen.  Takes inhalers. 6. History of schizoaffective disorder takes monthly injection of Invega and daily on Klonopin and Cogentin. 7. Chronic anemia follow CBC. 8. History of diastolic dysfunction holding Lasix due to patient appearing dehydrated and had worsening renal function.  Closely monitor respiratory status.  Given the septic picture of presentation with Covid infection will need further close monitoring for any further deterioration in inpatient status.   DVT prophylaxis: Lovenox. Code Status: Full code as confirmed with patient's legal guardian. Family Communication: Patient's legal guardian Robert Little. Disposition Plan: Back to facility when stable. Consults called: None. Admission status: Inpatient.   Rise Patience MD [facility] Pager [phone].  If 7PM-7AM, please contact night-coverage [email or web address] Password [email or web address]  11/12/2019, 11:46 PM

## 2019-11-13 ENCOUNTER — Other Ambulatory Visit (HOSPITAL_COMMUNITY): Payer: Medicare Other

## 2019-11-13 DIAGNOSIS — N184 Chronic kidney disease, stage 4 (severe): Secondary | ICD-10-CM

## 2019-11-13 DIAGNOSIS — I2510 Atherosclerotic heart disease of native coronary artery without angina pectoris: Secondary | ICD-10-CM

## 2019-11-13 DIAGNOSIS — I5032 Chronic diastolic (congestive) heart failure: Secondary | ICD-10-CM

## 2019-11-13 DIAGNOSIS — N179 Acute kidney failure, unspecified: Secondary | ICD-10-CM

## 2019-11-13 DIAGNOSIS — A4189 Other specified sepsis: Principal | ICD-10-CM

## 2019-11-13 DIAGNOSIS — I10 Essential (primary) hypertension: Secondary | ICD-10-CM

## 2019-11-13 DIAGNOSIS — J9601 Acute respiratory failure with hypoxia: Secondary | ICD-10-CM

## 2019-11-13 DIAGNOSIS — U071 COVID-19: Secondary | ICD-10-CM

## 2019-11-13 LAB — COMPREHENSIVE METABOLIC PANEL
ALT: 16 U/L (ref 0–44)
AST: 34 U/L (ref 15–41)
Albumin: 2.8 g/dL — ABNORMAL LOW (ref 3.5–5.0)
Alkaline Phosphatase: 58 U/L (ref 38–126)
Anion gap: 13 (ref 5–15)
BUN: 54 mg/dL — ABNORMAL HIGH (ref 8–23)
CO2: 17 mmol/L — ABNORMAL LOW (ref 22–32)
Calcium: 7 mg/dL — ABNORMAL LOW (ref 8.9–10.3)
Chloride: 112 mmol/L — ABNORMAL HIGH (ref 98–111)
Creatinine, Ser: 2.51 mg/dL — ABNORMAL HIGH (ref 0.61–1.24)
GFR calc Af Amer: 28 mL/min — ABNORMAL LOW (ref >60)
GFR calc non Af Amer: 24 mL/min — ABNORMAL LOW (ref >60)
Glucose, Bld: 112 mg/dL — ABNORMAL HIGH (ref 70–99)
Potassium: 3.8 mmol/L (ref 3.5–5.1)
Sodium: 142 mmol/L (ref 135–145)
Total Bilirubin: 0.6 mg/dL (ref 0.3–1.2)
Total Protein: 5.7 g/dL — ABNORMAL LOW (ref 6.5–8.1)

## 2019-11-13 LAB — CBC
HCT: 27.9 % — ABNORMAL LOW (ref 39.0–52.0)
HCT: 28.7 % — ABNORMAL LOW (ref 39.0–52.0)
Hemoglobin: 8.8 g/dL — ABNORMAL LOW (ref 13.0–17.0)
Hemoglobin: 9 g/dL — ABNORMAL LOW (ref 13.0–17.0)
MCH: 28.8 pg (ref 26.0–34.0)
MCH: 29.2 pg (ref 26.0–34.0)
MCHC: 31.5 g/dL (ref 30.0–36.0)
MCHC: 31.4 g/dL (ref 30.0–36.0)
MCV: 91.2 fL (ref 80.0–100.0)
MCV: 93.2 fL (ref 80.0–100.0)
Platelets: 190 10*3/uL (ref 150–400)
Platelets: 179 10*3/uL (ref 150–400)
RBC: 3.06 MIL/uL — ABNORMAL LOW (ref 4.22–5.81)
RBC: 3.08 MIL/uL — ABNORMAL LOW (ref 4.22–5.81)
RDW: 15.5 % (ref 11.5–15.5)
RDW: 15.5 % (ref 11.5–15.5)
WBC: 5.1 10*3/uL (ref 4.0–10.5)
WBC: 4.4 10*3/uL (ref 4.0–10.5)
nRBC: 0 % (ref 0.0–0.2)
nRBC: 0 % (ref 0.0–0.2)

## 2019-11-13 LAB — C-REACTIVE PROTEIN: CRP: 7 mg/dL — ABNORMAL HIGH (ref <1.0)

## 2019-11-13 LAB — HEPARIN LEVEL (UNFRACTIONATED): Heparin Unfractionated: 1.6 IU/mL — ABNORMAL HIGH (ref 0.30–0.70)

## 2019-11-13 LAB — URINALYSIS, ROUTINE W REFLEX MICROSCOPIC
Bilirubin Urine: NEGATIVE
Glucose, UA: NEGATIVE mg/dL
Ketones, ur: NEGATIVE mg/dL
Leukocytes,Ua: NEGATIVE
Nitrite: NEGATIVE
Protein, ur: NEGATIVE mg/dL
Specific Gravity, Urine: 1.009 (ref 1.005–1.030)
pH: 5 (ref 5.0–8.0)

## 2019-11-13 LAB — PROCALCITONIN: Procalcitonin: <0.1 ng/mL

## 2019-11-13 LAB — CBG MONITORING, ED
Glucose-Capillary: 102 mg/dL — ABNORMAL HIGH (ref 70–99)
Glucose-Capillary: 116 mg/dL — ABNORMAL HIGH (ref 70–99)

## 2019-11-13 LAB — NA AND K (SODIUM & POTASSIUM), RAND UR
Potassium Urine: 21 mmol/L
Sodium, Ur: 118 mmol/L

## 2019-11-13 LAB — D-DIMER, QUANTITATIVE
D-Dimer, Quant: 1.6 ug/mL-FEU — ABNORMAL HIGH (ref 0.00–0.50)
D-Dimer, Quant: 1.75 ug/mL-FEU — ABNORMAL HIGH (ref 0.00–0.50)

## 2019-11-13 LAB — URINE CULTURE: Culture: NO GROWTH

## 2019-11-13 LAB — CREATININE, SERUM
Creatinine, Ser: 2.78 mg/dL — ABNORMAL HIGH (ref 0.61–1.24)
GFR calc Af Amer: 24 mL/min — ABNORMAL LOW (ref >60)
GFR calc non Af Amer: 21 mL/min — ABNORMAL LOW (ref >60)

## 2019-11-13 LAB — OSMOLALITY: Osmolality: 304 mOsm/kg — ABNORMAL HIGH (ref 275–295)

## 2019-11-13 LAB — TROPONIN I (HIGH SENSITIVITY): Troponin I (High Sensitivity): 3179 ng/L (ref <18)

## 2019-11-13 LAB — URIC ACID: Uric Acid, Serum: 10.3 mg/dL — ABNORMAL HIGH (ref 3.7–8.6)

## 2019-11-13 LAB — GLUCOSE, CAPILLARY
Glucose-Capillary: 169 mg/dL — ABNORMAL HIGH (ref 70–99)
Glucose-Capillary: 222 mg/dL — ABNORMAL HIGH (ref 70–99)

## 2019-11-13 LAB — OSMOLALITY, URINE: Osmolality, Ur: 402 mOsm/kg (ref 300–900)

## 2019-11-13 LAB — MAGNESIUM: Magnesium: 0.8 mg/dL — CL (ref 1.7–2.4)

## 2019-11-13 LAB — BRAIN NATRIURETIC PEPTIDE: B Natriuretic Peptide: 689 pg/mL — ABNORMAL HIGH (ref 0.0–100.0)

## 2019-11-13 LAB — CREATININE, URINE, RANDOM: Creatinine, Urine: 33.99 mg/dL

## 2019-11-13 MED ORDER — ALBUTEROL SULFATE HFA 108 (90 BASE) MCG/ACT IN AERS
2.0000 | INHALATION_SPRAY | Freq: Every day | RESPIRATORY_TRACT | Status: DC
  Administered 2019-11-14 – 2019-11-29 (×17): 2 via RESPIRATORY_TRACT
  Filled 2019-11-13: qty 6.7

## 2019-11-13 MED ORDER — MAGNESIUM SULFATE 2 GM/50ML IV SOLN: 2.0000 g | Freq: Once | INTRAVENOUS | Status: DC

## 2019-11-13 MED ORDER — HEPARIN (PORCINE) 25000 UT/250ML-% IV SOLN
1200.0000 [IU]/h | INTRAVENOUS | Status: DC
  Administered 2019-11-13: 1200 [IU]/h via INTRAVENOUS
  Filled 2019-11-13: qty 250

## 2019-11-13 MED ORDER — INSULIN ASPART 100 UNIT/ML ~~LOC~~ SOLN
0.0000 [IU] | Freq: Three times a day (TID) | SUBCUTANEOUS | Status: DC
  Administered 2019-11-13 – 2019-11-14 (×2): 1 [IU] via SUBCUTANEOUS
  Administered 2019-11-14 (×2): 2 [IU] via SUBCUTANEOUS
  Administered 2019-11-15: 3 [IU] via SUBCUTANEOUS
  Administered 2019-11-15: 2 [IU] via SUBCUTANEOUS
  Administered 2019-11-15 – 2019-11-16 (×2): 1 [IU] via SUBCUTANEOUS
  Administered 2019-11-16: 3 [IU] via SUBCUTANEOUS
  Administered 2019-11-16 – 2019-11-17 (×4): 1 [IU] via SUBCUTANEOUS
  Administered 2019-11-19: 2 [IU] via SUBCUTANEOUS
  Administered 2019-11-19: 3 [IU] via SUBCUTANEOUS
  Administered 2019-11-19 – 2019-11-20 (×2): 1 [IU] via SUBCUTANEOUS
  Administered 2019-11-20 – 2019-11-21 (×3): 3 [IU] via SUBCUTANEOUS
  Administered 2019-11-21 – 2019-11-22 (×2): 1 [IU] via SUBCUTANEOUS
  Administered 2019-11-22 (×2): 2 [IU] via SUBCUTANEOUS
  Administered 2019-11-23 (×2): 1 [IU] via SUBCUTANEOUS
  Administered 2019-11-23: 2 [IU] via SUBCUTANEOUS
  Administered 2019-11-24 – 2019-11-25 (×2): 1 [IU] via SUBCUTANEOUS
  Administered 2019-11-25: 2 [IU] via SUBCUTANEOUS
  Administered 2019-11-25: 3 [IU] via SUBCUTANEOUS
  Administered 2019-11-26 – 2019-11-29 (×5): 1 [IU] via SUBCUTANEOUS
  Administered 2019-11-30: 2 [IU] via SUBCUTANEOUS

## 2019-11-13 MED ORDER — DEXAMETHASONE SODIUM PHOSPHATE 10 MG/ML IJ SOLN
6.0000 mg | INTRAMUSCULAR | Status: DC
  Administered 2019-11-13 – 2019-11-16 (×4): 6 mg via INTRAVENOUS
  Filled 2019-11-13 (×4): qty 1

## 2019-11-13 MED ORDER — HEPARIN BOLUS VIA INFUSION
4000.0000 [IU] | Freq: Once | INTRAVENOUS | Status: AC
  Administered 2019-11-13: 4000 [IU] via INTRAVENOUS
  Filled 2019-11-13: qty 4000

## 2019-11-13 MED ORDER — CARVEDILOL 3.125 MG PO TABS
3.1250 mg | ORAL_TABLET | Freq: Two times a day (BID) | ORAL | Status: DC
  Administered 2019-11-13 – 2019-11-18 (×8): 3.125 mg via ORAL
  Filled 2019-11-13 (×10): qty 1

## 2019-11-13 MED ORDER — MAGNESIUM SULFATE 4 GM/100ML IV SOLN
4.0000 g | Freq: Once | INTRAVENOUS | Status: AC
  Administered 2019-11-13: 4 g via INTRAVENOUS
  Filled 2019-11-13: qty 100

## 2019-11-13 MED ORDER — HEPARIN (PORCINE) 25000 UT/250ML-% IV SOLN: 1000.0000 [IU]/h | INTRAVENOUS | Status: DC

## 2019-11-13 NOTE — ED Notes (Signed)
Lunch Tray Ordered 

## 2019-11-13 NOTE — Plan of Care (Signed)

## 2019-11-13 NOTE — Progress Notes (Signed)
ANTICOAGULATION CONSULT NOTE - Initial Consult  Pharmacy Consult for heparin Indication: chest pain/ACS  No Known Allergies  Patient Measurements: Height: 5\' 8"  (172.7 cm) Weight: 69.4 kg (153 lb) IBW/kg (Calculated) : 68.4 Heparin Dosing Weight: 69.4kg  Vital Signs: Temp: 97.6 F (36.4 C) (05/11 0730) Temp Source: Oral (05/11 0730) BP: 139/75 (05/11 0930) Pulse Rate: 80 (05/11 0930)  Labs: Recent Labs    11/12/19 1916 11/12/19 1916 11/12/19 1939 11/12/19 1939 11/12/19 2132 11/13/19 0417 11/13/19 0755  HGB 9.4*   < > 9.5*   < >  --  8.8* 9.0*  HCT 29.3*   < > 28.0*  --   --  27.9* 28.7*  PLT 200  --   --   --   --  190 179  APTT 33  --   --   --   --   --   --   LABPROT 13.2  --   --   --   --   --   --   INR 1.0  --   --   --   --   --   --   CREATININE 3.08*  --   --   --   --  2.78* 2.51*  TROPONINIHS 54*  --   --   --  61*  --  3,179*   < > = values in this interval not displayed.    Estimated Creatinine Clearance: 23.8 mL/min (A) (by C-G formula based on SCr of 2.51 mg/dL (H)).   Medical History: Past Medical History:  Diagnosis Date  . Anemia   . Coronary artery disease   . GERD (gastroesophageal reflux disease)   . Hypertension   . Pulmonary fibrosis ([place])   . Schizo affective schizophrenia ([place])     Medications:  Infusions:  . sodium chloride 75 mL/hr at 11/13/19 0420  . heparin    . magnesium sulfate bolus IVPB    . remdesivir 100 mg in NS 100 mL      Assessment: 28 yom presented to the ED with SOB and diagnosed with COVID. Also with elevated troponin so will begin IV heparin. Baseline Hgb is low but platelets are WNL. He is not on anticoagulation PTA.   Goal of Therapy:  Heparin level 0.3-0.7 units/ml Monitor platelets by anticoagulation protocol: Yes   Plan:  Heparin bolus 4000 units IV x 1 Heparin gtt 1200 units/hr - recently therapeutic on this Check an 8 hr heparin level Daily heparin level and CBC  Nylan Nevel, Rande Lawman 11/13/2019,10:07 AM

## 2019-11-13 NOTE — Consult Note (Signed)
Cardiology Consultation:   Patient ID: Robert Little; [id number]; [birth year]   Admit date: 11/12/2019 Date of Consult: 11/13/2019  Primary Care Provider: Care, [name] Total Access Primary Cardiologist: Sanda Klein, MD 09/21/2019 in-hospital Primary Electrophysiologist:  None   Patient Profile:   Robert Little is a 79 y.o. male with a hx of CAD s/p prior CABG([facility] 02/2004), HTN, HLD, pulmonary fibrosis on home O2 at 2 lpm, schizoaffective disorder in a group home, CKD IV, anemia of chronic disease, and GERD, who is being seen today for the evaluation of elevated troponin at the request of Dr Candiss Norse.  History of Present Illness:   Robert Little was admitted 03/14-03/19/2021 with acute hypoxic resp failure 2nd D-CHF, wt at d/c 151 lbs. 04/07 office visit, stable, BMET ok 04/25-04/26 admitted after a fall, CBG 25, glipizide d/c'd, f/u PCP  05/10 admit for cough, SOB and fever, +COVID, dehydration w/ Cr 3.08, troponin checked and was elevated, cards asked to see.   Pt is confused and memory poor.   My exam, the patient was clearly confused of him and not appliance questions completely, but he is sure that he is not having any chest pain or pressure. He is not having any sensation rapidly harbors or palpitations. He never did feel any chest discomfort prior to evaluation.  Past Medical History:  Diagnosis Date  . Anemia   . Coronary artery disease   . GERD (gastroesophageal reflux disease)   . Hypertension   . Pulmonary fibrosis ([place])   . Schizo affective schizophrenia [facility])     Past Surgical History:  Procedure Laterality Date  . CORONARY ARTERY BYPASS GRAFT       Prior to Admission medications   Medication Sig Start Date End Date Taking? Authorizing Provider  acetaminophen (TYLENOL) 325 MG tablet Take 2 tablets (650 mg total) by mouth every 6 (six) hours as needed for mild pain (or Fever >/= 101). Patient taking differently: Take 325-650 mg by mouth at bedtime.  05/18/18   Yes Debbe Odea, MD  albuterol (VENTOLIN HFA) 108 (90 Base) MCG/ACT inhaler Inhale 2 puffs into the lungs every 6 (six) hours as needed for wheezing or shortness of breath. Patient taking differently: Inhale 2 puffs into the lungs at bedtime.  09/21/19  Yes Mercy Riding, MD  aspirin EC 81 MG EC tablet Take 1 tablet (81 mg total) by mouth daily. 09/22/19  Yes Mercy Riding, MD  atenolol (TENORMIN) 25 MG tablet Take 25 mg by mouth daily.   Yes [provider]  atorvastatin (LIPITOR) 20 MG tablet Take 20 mg by mouth at bedtime.   Yes [provider]  benztropine (COGENTIN) 0.5 MG tablet Take 0.5 mg by mouth 2 (two) times daily.   Yes [provider]  Calcium Carbonate-Vitamin D (CALCIUM 600/VITAMIN D) 600-400 MG-UNIT chew tablet Chew 1 tablet by mouth 2 (two) times daily.   Yes [provider]  clonazePAM (KLONOPIN) 0.5 MG tablet Take 0.5 mg by mouth 2 (two) times daily.    Yes [provider]  Dexlansoprazole (DEXILANT) 30 MG capsule Take 30 mg by mouth daily.   Yes [provider]  ferrous sulfate 325 (65 FE) MG tablet Take 1 tablet (325 mg total) by mouth 2 (two) times daily with a meal. 09/21/19  Yes Gonfa, Taye T, MD  fluticasone furoate-vilanterol (BREO ELLIPTA) 200-25 MCG/INH AEPB Inhale 1 puff into the lungs daily. 09/22/19  Yes Mercy Riding, MD  furosemide (LASIX) 40 MG tablet Take 1 tablet (  40 mg total) by mouth daily. 09/22/19  Yes Mercy Riding, MD  ipratropium (ATROVENT) 0.03 % nasal spray Place 1 spray into the nose daily as needed for congestion. 11/02/19  Yes [provider]  Multiple Vitamin (MULTIVITAMIN WITH MINERALS) TABS tablet Take 1 tablet by mouth daily.   Yes [provider]  polyethylene glycol powder (MIRALAX) 17 GM/SCOOP powder Take 17 g by mouth 2 (two) times daily as needed for moderate constipation. 09/21/19  Yes Mercy Riding, MD  potassium chloride SA (KLOR-CON) 20 MEQ tablet Take 1 tablet (20 mEq  total) by mouth daily. 09/22/19  Yes Mercy Riding, MD  tamsulosin (FLOMAX) 0.4 MG CAPS capsule Take 1 capsule (0.4 mg total) by mouth daily after supper. Patient taking differently: Take 0.4 mg by mouth daily.  05/18/18  Yes Debbe Odea, MD  umeclidinium bromide (INCRUSE ELLIPTA) 62.5 MCG/INH AEPB Inhale 1 puff into the lungs daily. 09/22/19  Yes Mercy Riding, MD  vitamin B-12 (CYANOCOBALAMIN) 500 MCG tablet Take 500 mcg by mouth 2 (two) times daily.   Yes [provider]  calcium gluconate 500 MG tablet Take 1 tablet (500 mg total) by mouth 2 (two) times daily. Patient not taking: Reported on 10/28/2019 09/07/19   Patrecia Pour, MD  paliperidone (INVEGA SUSTENNA) 156 MG/ML SUSY injection Inject 156 mg into the muscle every 30 (thirty) days.    [provider]  senna-docusate (SENOKOT-S) 8.6-50 MG tablet Take 1 tablet by mouth 2 (two) times daily between meals as needed for mild constipation. Patient not taking: Reported on 10/28/2019 09/21/19   Mercy Riding, MD    Inpatient Medications: Scheduled Meds: . albuterol  2 puff Inhalation Q2200  . aspirin EC  81 mg Oral Daily  . atorvastatin  20 mg Oral QHS  . benztropine  0.5 mg Oral BID  . carvedilol  3.125 mg Oral BID WC  . clonazePAM  0.5 mg Oral BID  . dexamethasone (DECADRON) injection  6 mg Intravenous Q24H  . ferrous sulfate  325 mg Oral BID WC  . fluticasone furoate-vilanterol  1 puff Inhalation Daily  . insulin aspart  0-6 Units Subcutaneous TID WC  . pantoprazole  40 mg Oral Daily  . tamsulosin  0.4 mg Oral Daily  . umeclidinium bromide  1 puff Inhalation Daily  . vitamin B-12  500 mcg Oral BID   Continuous Infusions: . sodium chloride 75 mL/hr at 11/13/19 1443  . heparin 1,200 Units/hr (11/13/19 1134)  . remdesivir 100 mg in NS 100 mL     PRN Meds: ondansetron **OR** ondansetron (ZOFRAN) IV  Allergies:   No Known Allergies  Social History:   Social History   Socioeconomic History  . Marital status:  Unknown    Spouse name: Not on file  . Number of children: Not on file  . Years of education: Not on file  . Highest education level: Not on file  Occupational History  . Not on file  Tobacco Use  . Smoking status: Former Smoker    Types: Cigarettes  . Smokeless tobacco: Never Used  Substance and Sexual Activity  . Alcohol use: Not Currently  . Drug use: Not Currently  . Sexual activity: Not Currently  Other Topics Concern  . Not on file  Social History Narrative  . Not on file   Social Determinants of Health   Financial Resource Strain:   . Difficulty of Paying Living Expenses:   Food Insecurity:   . Worried About  Running Out of Food in the Last Year:   . [place] in the Last Year:   Transportation Needs:   . Lack of Transportation (Medical):   Marland Kitchen Lack of Transportation (Non-Medical):   Physical Activity:   . Days of Exercise per Week:   . Minutes of Exercise per Session:   Stress:   . Feeling of Stress :   Social Connections:   . Frequency of Communication with Friends and Family:   . Frequency of Social Gatherings with Friends and Family:   . Attends Religious Services:   . Active Member of Clubs or Organizations:   . Attends Archivist Meetings:   Marland Kitchen Marital Status:   Intimate Partner Violence:   . Fear of Current or Ex-Partner:   . Emotionally Abused:   Marland Kitchen Physically Abused:   . Sexually Abused:     Family History:   Family History  Problem Relation Age of Onset  . Diabetes Mellitus II Mother   . Diabetes Mellitus II Maternal Grandmother   . Heart disease Maternal Grandmother    Family Status:  Family Status  Relation Name Status  . Mother  (Not Specified)  . MGM  (Not Specified)    ROS:  cough, fever, dyspnea and hypoxia. Dizziness but no syncope or near syncope.  Please see the history of present illness.  All other ROS reviewed and negative.     Physical Exam/Data:   Vitals:   11/13/19 1330 11/13/19 1400 11/13/19 1430  11/13/19 1500  BP: (!) 130/57 132/60 (!) 142/61 128/73  Pulse: 80  94 94  Resp: (!) 35     Temp:      TempSrc:      SpO2: 95% 98% 92% 94%  Weight:      Height:        Intake/Output Summary (Last 24 hours) at 11/13/2019 1521 Last data filed at 11/13/2019 1413 Gross per 24 hour  Intake 100 ml  Output 1100 ml  Net -1000 ml    Last 3 Weights 11/13/2019 10/28/2019 10/10/2019  Weight (lbs) 153 lb 153 lb 153 lb  Weight (kg) 69.4 kg 69.4 kg 69.4 kg     Body mass index is 23.26 kg/m.   General:  Well nourished, well developed, male in no acute distress HEENT: normal, [place]/ATV Lymph: no adenopathy Neck: no obvious JVD. Endocrine:  No thryomegaly Vascular: No carotid bruits; 4/4 extremity pulses 2+  Cardiac:  normal, but distant S1, S2; RRR; no murmur Lungs: occasional cough. Good air movement. Mild crackles but otherwise CTA B. Not labored Abd: soft, nontender, no hepatomegaly  Ext: no clubbing, cyanosis, edema Musculoskeletal:  No deformities, BUE and BLE strength normal and equal Skin: warm and dry; pale Neuro:  CNs 2-12 intact, no focal abnormalities noted Psych:  Normal affect, somewhat confused. Poor historian.  EKG:  The EKG was personally reviewed and demonstrates:  SR, HR 81, no acute ischemic changes, early R wave transition is old Telemetry:  Telemetry was personally reviewed and demonstrates:  SR   CV studies:   ECHO:  09/06/19: 1. Left ventricular ejection fraction, by estimation, is 60 to 65%. The  left ventricle has normal function. The left ventricle has no regional  wall motion abnormalities. There is mild concentric left ventricular  hypertrophy. Left ventricular diastolic  parameters are consistent with Grade II diastolic dysfunction  (pseudonormalization). Elevated left atrial pressure.  2. Right ventricular systolic function is normal. The right ventricular  size is normal. Tricuspid regurgitation  signal is inadequate for assessing  PA pressure.  3. Left  atrial size was moderately dilated.  4. The mitral valve is normal in structure and function. Trivial mitral  valve regurgitation. No evidence of mitral stenosis.  5. The aortic valve is normal in structure and function. Aortic valve  regurgitation is not visualized. Mild aortic valve sclerosis is present,  with no evidence of aortic valve stenosis.  6. The inferior vena cava is dilated in size with >50% respiratory  variability, suggesting right atrial pressure of 8 mmHg.    CATH: none in our system, no Care Everywhere records   Laboratory Data:   Chemistry Recent Labs  Lab 11/12/19 1916 11/12/19 1939 11/13/19 0417 11/13/19 0755  NA 137 137  --  142  K 4.5 4.3  --  3.8  CL 103  --   --  112*  CO2 17*  --   --  17*  GLUCOSE 181*  --   --  112*  BUN 67*  --   --  54*  CREATININE 3.08*  --  2.78* 2.51*  CALCIUM 8.0*  --   --  7.0*  GFRNONAA 19*  --  21* 24*  GFRAA 21*  --  24* 28*  ANIONGAP 17*  --   --  13    Lab Results  Component Value Date   ALT 16 11/13/2019   AST 34 11/13/2019   ALKPHOS 58 11/13/2019   BILITOT 0.6 11/13/2019   Hematology Recent Labs  Lab 11/12/19 1916 11/12/19 1916 11/12/19 1939 11/13/19 0417 11/13/19 0755  WBC 5.2  --   --  5.1 4.4  RBC 3.22*  --   --  3.06* 3.08*  HGB 9.4*   < > 9.5* 8.8* 9.0*  HCT 29.3*   < > 28.0* 27.9* 28.7*  MCV 91.0  --   --  91.2 93.2  MCH 29.2  --   --  28.8 29.2  MCHC 32.1  --   --  31.5 31.4  RDW 15.3  --   --  15.5 15.5  PLT 200  --   --  190 179   < > = values in this interval not displayed.   Cardiac Enzymes High Sensitivity Troponin:   Recent Labs  Lab 11/12/19 1916 11/12/19 2132 11/13/19 0755  TROPONINIHS 54* 61* 3,179*      BNP Recent Labs  Lab 11/12/19 1917 11/13/19 0755  BNP 155.7* 689.0*    DDimer  Recent Labs  Lab 11/13/19 0755  DDIMER 1.75*  1.60*   TSH:  Lab Results  Component Value Date   TSH 4.766 (H) 10/28/2019   Lipids: Lab Results  Component Value Date    CHOL 85 09/21/2019   HDL 33 (L) 09/21/2019   LDLCALC 41 09/21/2019   TRIG 56 09/21/2019   CHOLHDL 2.6 09/21/2019   HgbA1c: Lab Results  Component Value Date   HGBA1C 7.0 (H) 09/20/2019   Magnesium:  Magnesium  Date Value Ref Range Status  11/13/2019 0.8 (LL) 1.7 - 2.4 mg/dL Final    Comment:    CRITICAL RESULT CALLED TO, READ BACK BY AND VERIFIED WITH: M.FOUNTAIN,RN 1610 11/13/2019 CLARK,S Performed at [facility], [place] [address] [place], [place] [address]      Radiology/Studies:  [facility] Chest Port 1 View  Result Date: 11/12/2019 CLINICAL DATA:  Shortness of breath and sepsis. Fever. Productive cough. EXAM: PORTABLE CHEST 1 VIEW COMPARISON:  10/28/2019.  09/19/2019. FINDINGS: The patient has a history of  pulmonary fibrosis and emphysema. Chronic lung markings appear similar allowing for technical differences. Upper lobe emphysema as seen previously. No sign of dense consolidation, collapse or effusion. Previous median sternotomy and CABG. Aortic atherosclerosis. No acute bone finding. IMPRESSION: Portable film with the poor inspiration. Chronic fibrotic lung disease and upper lung emphysema. No sign of acute infiltrate, collapse or effusion. Electronically Signed   By: Nelson Chimes M.D.   On: 11/12/2019 19:37    Assessment and Plan:   1. NSTEMI/troponin elevation->  no chest pain, no EKG changes, nothing to suggest ACS symptoms. Suspect troponin elevation is probably related to COVID-19 sepsis  since we cannot exclude ACS, not unreasonable to treat with IV heparin for 48 hours.  not unreasonable to check unlimited echocardiogram just to ensure no significant change from recent echo.  Would continue home beta-blocker, statin and aspirin 81 mg.  Would not plan for steaming evaluation during this hospital stay. Could potentially consider outpatient ischemic evaluation depending on how he does post hospital. We'll defer to primary cardiologist.  2. Chronic diastolic CHF ->  seems relatively make in fact potentially dehydrated. - in order to treat sepsis and ARF, pt given 2000 ml saline bolus - BNP increased overnight - probably more related to pulmonary issues then true cardiac issues. Also could be related to elevated troponin level. - discuss w/ MD if we need to repeat CXR - need daily wts, strict I/O -> avoid diuretic for now.   3. AKI on CKD III-IV - baseline Cr low 2s - however, Cr peak 3.46 when hospitalized 09/2019 for CHF - BUN/Cr improved overnight w/ hydration -> continue to hold diuretic - per IM  Pulmonary fibrosis, CKD-four with okay I and acute respiratory failure/hypoxia along with sepsis  per IM   Principal Problem:   Sepsis due to COVID-19 [facility]) Active Problems:   Coronary artery disease involving native coronary artery of native heart without angina pectoris   Essential hypertension   Chronic diastolic CHF (congestive heart failure), NYHA class 1 (HCC)   Pulmonary fibrosis (HCC)   Sepsis (HCC)   AKI (acute kidney injury) ([place])   CKD (chronic kidney disease), stage IV ([place])   Acute respiratory failure with hypoxia ([place])     For questions or updates, please contact [facility] Please consult [email or web address] for contact info under Cardiology/STEMI.   Signed, Rosaria Ferries, PA-C  11/13/2019 10:43 AM  ATTENDING ATTESTATION  I have seen, examined and evaluated the patient this PM along with Rosaria Ferries, PA.  After reviewing all the available data and chart, we discussed the patients laboratory, study & physical findings as well as symptoms in detail. I agree with her findings, examination as well as impression recommendations as per our discussion.    Attending adjustments noted in italics.   79 year old gentleman, very poor historian admitted after recent hospitalization for hypoxia thought to be related CHF now presenting with COVID-19 infection/pneumonia/sepsis with acute respiratory failure/hypoxia. He has been found to  have elevated troponin levels but has not had any chest pain or pressure. I doubt this is ACS, more likely related to COVID-19 infection.  Not unreasonable to check to the echocardiogram just to confirm their regional [employer] animality on that will be considered new. Excellent otherwise supportive care with home cardiac medications and roughly 48 hours of IV heparin is reasonable.  He does not have a sciences heart failure. I will continue to hold diuretic. Probably need to restart prior to discharge.  Cardiology for fall long from a  distance, but agree with the plan as laid out by the primary team.   Glenetta Hew, M.D., M.S. Interventional Cardiologist   Pager # [phone] Phone # [phone] [address] [place], [place] [address]

## 2019-11-13 NOTE — Progress Notes (Signed)
ANTICOAGULATION CONSULT NOTE - Follow Up Consult  Pharmacy Consult for Heparin Indication: chest pain/ACS  No Known Allergies  Patient Measurements: Height: 5\' 8"  (172.7 cm) Weight: 69.4 kg (153 lb) IBW/kg (Calculated) : 68.4 Heparin Dosing Weight: 69.4 kg  Vital Signs: Temp: 97.6 F (36.4 C) (05/11 1928) Temp Source: Oral (05/11 1928) BP: 115/66 (05/11 1928) Pulse Rate: 78 (05/11 1928)  Labs: Recent Labs    11/12/19 1916 11/12/19 1916 11/12/19 1939 11/12/19 1939 11/12/19 2132 11/13/19 0417 11/13/19 0755 11/13/19 1842  HGB 9.4*   < > 9.5*   < >  --  8.8* 9.0*  --   HCT 29.3*   < > 28.0*  --   --  27.9* 28.7*  --   PLT 200  --   --   --   --  190 179  --   APTT 33  --   --   --   --   --   --   --   LABPROT 13.2  --   --   --   --   --   --   --   INR 1.0  --   --   --   --   --   --   --   HEPARINUNFRC  --   --   --   --   --   --   --  1.60*  CREATININE 3.08*  --   --   --   --  2.78* 2.51*  --   TROPONINIHS 54*  --   --   --  61*  --  3,179*  --    < > = values in this interval not displayed.    Estimated Creatinine Clearance: 23.8 mL/min (A) (by C-G formula based on SCr of 2.51 mg/dL (H)).  Assessment:  Robert Little presented to the ED with SOB and diagnosed with COVID. Also with elevated troponin so started IV heparin. Baseline Hgb is low but platelets are WNL. He is not on anticoagulation PTA.     Initial heparin level is supratherapeutic (1.60) on 1200 units/hr.   Sample drawn from opposite arm of infusion.  No bleeding reported.  Goal of Therapy:  Heparin level 0.3-0.7 units/ml Monitor platelets by anticoagulation protocol: Yes   Plan:   Hold Heparin for 1 hour.  Then resume heparin at 1000 units/hr (~3 units/kg/hr decrease)  Heparin level and CBC in am, about 8 hours after resumed.  Arty Baumgartner, [place] Phone: [phone] 11/13/2019,8:26 PM

## 2019-11-13 NOTE — Progress Notes (Addendum)
PROGRESS NOTE                                                                                                                                                                                                             Patient Demographics:    Robert Little, is a 79 y.o. male, DOB - January 21, 1941, RKY:[id number]  Outpatient Primary MD for the patient is Care, [name]    LOS - 1  Admit date - 11/12/2019    Chief Complaint  Patient presents with  . Shortness of Breath       Brief Narrative Robert Little is a 79 y.o. male with history of his affective disorder, CAD, pulmonary fibrosis on home oxygen usually 2 L anemia hypertension diabetes mellitus recently admitted for hypoglycemia with use of sulfonylureas in the setting of chronic kidney disease was brought to the ER after patient has been having productive cough over the last 3 days feeling weak and tired not eating well.  ER he was diagnosed with acute hypoxic respiratory failure due to COVID-19 pneumonia, dehydration AKI and NSTEMI.   Subjective:    Robert Little today has, No headache, No chest pain, No abdominal pain - No Nausea, No new weakness tingling or numbness, mild SOB.   Assessment  & Plan :     1. Sepsis with Acute Hypoxic Resp. Failure due to Acute Covid 19 Viral Pneumonitis during the ongoing 2020 Covid 19 Pandemic - he has moderate disease has been started on IV steroids and remdesivir, will monitor closely.  Poor candidate for Actemra due to underlying frail status.  Sepsis pathophysiology seems to have resolved.  Encouraged the patient to sit up in chair in the daytime use I-S and flutter valve for pulmonary toiletry and then prone in bed when at night.  Will advance activity and titrate down oxygen as possible.  SpO2: 95 % O2 Flow Rate (L/min): 3 L/min  Recent Labs  Lab 11/12/19 1916 11/12/19 1917 11/13/19 0755  CRP  --   --  7.0*    DDIMER  --   --  1.75*  1.60*  BNP  --  155.7* 689.0*  PROCALCITON <0.10  --  <0.10  SARSCOV2NAA POSITIVE*  --   --     Hepatic Function Latest Ref Rng & Units 11/13/2019 11/12/2019 10/28/2019  Total Protein 6.5 -  8.1 g/dL 5.7(L) 6.7 6.4(L)  Albumin 3.5 - 5.0 g/dL 2.8(L) 3.3(L) 3.6  AST 15 - 41 U/L 34 25 21  ALT 0 - 44 U/L _0 Alk Phosphatase 38 - 126 U/L 58 65 80  Total Bilirubin 0.3 - 1.2 mg/dL 0.6 0.7 0.9    2.  Dehydration with AKI on underlying CKD 4 with baseline creatinine of around 2.5.  Gently hydrate and monitor.  3.  History of CAD.  Possible NSTEMI.  Could be due to demand ischemia from hypoxia, continue aspirin, statin and beta-blocker for secondary prevention, check echocardiogram, heparin drip for 48 hours and cardiology input.  EKG stable and chest pain-free.  4. History of schizoaffective disorder takes monthly injection of Invega and daily on Klonopin and Cogentin.  5. History of pulmonary fibrosis now on 2 L home oxygen.  Continue supportive care with a liters as needed.  Outpatient pulmonary follow-up.  6.  Anemia of chronic disease.  Stable will monitor.  7.  Chronic diastolic CHF.  EF on recent echocardiogram was 60%.  Monitor.  Holding Lasix for now as he is dehydrated and has AKI.  8.  Chronically elevated D-dimers.  Previously has had negative lower extremity venous duplex, currently on heparin drip, will trend and monitor.    9. DM type II.  For now sliding scale.   Lab Results  Component Value Date   HGBA1C 7.0 (H) 09/20/2019   CBG (last 3)  Recent Labs    11/13/19 0757 11/13/19 1242  GLUCAP 102* 116*     Condition - Extremely Guarded  Family Communication  :  None  Code Status :  Full  Consults  :  Cards  Disposition Plan  :    Status is: Inpatient  Remains inpatient appropriate because:Inpatient level of care appropriate due to severity of illness   Dispo: The patient is from: Group home              Anticipated d/c is  to: Group home              Anticipated d/c date is: 3 days              Patient currently is not medically stable to d/c.  Will need completion of COVID-19 infection and acute hypoxic respiratory failure.   Procedures  :    TTE  PUD Prophylaxis :  None  DVT Prophylaxis  :   Heparin   Lab Results  Component Value Date   PLT 179 11/13/2019    Diet :  Diet Order            Diet heart healthy/carb modified Room service appropriate? Yes; Fluid consistency: Thin  Diet effective now               Inpatient Medications  Scheduled Meds: . albuterol  2 puff Inhalation Q2200  . aspirin EC  81 mg Oral Daily  . atorvastatin  20 mg Oral QHS  . benztropine  0.5 mg Oral BID  . carvedilol  3.125 mg Oral BID WC  . clonazePAM  0.5 mg Oral BID  . dexamethasone (DECADRON) injection  6 mg Intravenous Q24H  . ferrous sulfate  325 mg Oral BID WC  . fluticasone furoate-vilanterol  1 puff Inhalation Daily  . insulin aspart  0-6 Units Subcutaneous TID WC  . pantoprazole  40 mg Oral Daily  . tamsulosin  0.4 mg Oral Daily  . umeclidinium bromide  1 puff Inhalation Daily  .  vitamin B-12  500 mcg Oral BID   Continuous Infusions: . sodium chloride 75 mL/hr at 11/13/19 0420  . heparin 1,200 Units/hr (11/13/19 1134)  . remdesivir 100 mg in NS 100 mL     PRN Meds:.ondansetron **OR** ondansetron (ZOFRAN) IV  Antibiotics  :    Anti-infectives (From admission, onward)   Start     Dose/Rate Route Frequency Ordered Stop   11/13/19 1600  remdesivir 100 mg in sodium chloride 0.9 % 100 mL IVPB     100 mg 200 mL/hr over 30 Minutes Intravenous Daily 11/12/19 2146 11/17/19 1559   11/12/19 2300  remdesivir 200 mg in sodium chloride 0.9% 250 mL IVPB     200 mg 580 mL/hr over 30 Minutes Intravenous Once 11/12/19 2146 11/13/19 0104   11/12/19 2130  cefTRIAXone (ROCEPHIN) 2 g in sodium chloride 0.9 % 100 mL IVPB  Status:  Discontinued     2 g 200 mL/hr over 30 Minutes Intravenous  Once 11/12/19 2056  11/12/19 2125   11/12/19 2130  azithromycin (ZITHROMAX) 500 mg in sodium chloride 0.9 % 250 mL IVPB  Status:  Discontinued     500 mg 250 mL/hr over 60 Minutes Intravenous  Once 11/12/19 2056 11/12/19 2125       Time Spent in minutes  30   Lala Lund M.D on 11/13/2019 at 1:59 PM  To page go to [email or web address] - password [facility]  [facility] -  Office  [phone]  See all Orders from today for further details    Objective:   Vitals:   11/13/19 1100 11/13/19 1130 11/13/19 1300 11/13/19 1330  BP: (!) 123/59 126/63 (!) 114/54 (!) 130/57  Pulse: 90 85 74 80  Resp: (!) 24 (!) 29 (!) 39 (!) 35  Temp:      TempSrc:      SpO2: 92% 100% 95% 95%  Weight:      Height:        Wt Readings from Last 3 Encounters:  11/13/19 69.4 kg  10/28/19 69.4 kg  10/10/19 69.4 kg     Intake/Output Summary (Last 24 hours) at 11/13/2019 1359 Last data filed at 11/13/2019 0732 Gross per 24 hour  Intake --  Output 1100 ml  Net -1100 ml     Physical Exam  Awake Alert, No new F.N deficits,   [place].AT,PERRAL Supple Neck,No JVD, No cervical lymphadenopathy appriciated.  Symmetrical Chest wall movement, Good air movement bilaterally, CTAB RRR,No Gallops,Rubs or new Murmurs, No Parasternal Heave +ve B.Sounds, Abd Soft, No tenderness, No organomegaly appriciated, No rebound - guarding or rigidity. No Cyanosis, Clubbing or edema, No new Rash or bruise        Data Review:    CBC Recent Labs  Lab 11/12/19 1916 11/12/19 1939 11/13/19 0417 11/13/19 0755  WBC 5.2  --  5.1 4.4  HGB 9.4* 9.5* 8.8* 9.0*  HCT 29.3* 28.0* 27.9* 28.7*  PLT 200  --  190 179  MCV 91.0  --  91.2 93.2  MCH 29.2  --  28.8 29.2  MCHC 32.1  --  31.5 31.4  RDW 15.3  --  15.5 15.5  LYMPHSABS 0.9  --   --   --   MONOABS 0.3  --   --   --   EOSABS 0.0  --   --   --   BASOSABS 0.0  --   --   --     Chemistries  Recent Labs  Lab 11/12/19 1916 11/12/19 1939 11/13/19 0417  11/13/19 0755  NA 137 137  --  142   K 4.5 4.3  --  3.8  CL 103  --   --  112*  CO2 17*  --   --  17*  GLUCOSE 181*  --   --  112*  BUN 67*  --   --  54*  CREATININE 3.08*  --  2.78* 2.51*  CALCIUM 8.0*  --   --  7.0*  AST 25  --   --  34  ALT 17  --   --  16  ALKPHOS 65  --   --  58  BILITOT 0.7  --   --  0.6  MG  --   --   --  0.8*  INR 1.0  --   --   --      ------------------------------------------------------------------------------------------------------------------ No results for input(s): CHOL, HDL, LDLCALC, TRIG, CHOLHDL, LDLDIRECT in the last 72 hours.  Lab Results  Component Value Date   HGBA1C 7.0 (H) 09/20/2019   ------------------------------------------------------------------------------------------------------------------ No results for input(s): TSH, T4TOTAL, T3FREE, THYROIDAB in the last 72 hours.  Invalid input(s): FREET3  Cardiac Enzymes No results for input(s): CKMB, TROPONINI, MYOGLOBIN in the last 168 hours.  Invalid input(s): CK ------------------------------------------------------------------------------------------------------------------    Component Value Date/Time   BNP 689.0 (H) 11/13/2019 0755    Micro Results Recent Results (from the past 240 hour(s))  SARS Coronavirus 2 by RT PCR (hospital order, performed in [facility] hospital lab) Nasopharyngeal Nasopharyngeal Swab     Status: Abnormal   Collection Time: 11/12/19  7:16 PM   Specimen: Nasopharyngeal Swab  Result Value Ref Range Status   SARS Coronavirus 2 POSITIVE (A) NEGATIVE Final    Comment: RESULT CALLED TO, READ BACK BY AND VERIFIED WITH: T KELLY RN 11/12/19 2119 JDW (NOTE) SARS-CoV-2 target nucleic acids are DETECTED SARS-CoV-2 RNA is generally detectable in upper respiratory specimens  during the acute phase of infection.  Positive results are indicative  of the presence of the identified virus, but do not rule out bacterial infection or co-infection with other pathogens not detected by the test.   Clinical correlation with patient history and  other diagnostic information is necessary to determine patient infection status.  The expected result is negative. Fact Sheet for Patients:   StrictlyIdeas.no  Fact Sheet for Healthcare Providers:   BankingDealers.co.za   This test is not yet approved or cleared by the [place] FDA and  has been authorized for detection and/or diagnosis of SARS-CoV-2 by FDA under an Emergency Use Authorization (EUA).  This EUA will remain in effect (meaning this test can be used)  for the duration of  the COVID-19 declaration under Section 564(b)(1) of the Act, 21 U.S.C. section 360-bbb-3(b)(1), unless the authorization is terminated or revoked sooner. Performed at [facility], [place] [address], [place], [place] [address]   Blood Culture (routine x 2)     Status: None (Preliminary result)   Collection Time: 11/12/19  7:16 PM   Specimen: BLOOD  Result Value Ref Range Status   Specimen Description BLOOD LEFT FOREARM  Final   Special Requests   Final    BOTTLES DRAWN AEROBIC AND ANAEROBIC Blood Culture results may not be optimal due to an excessive volume of blood received in culture bottles   Culture   Final    NO GROWTH < 12 HOURS Performed at [facility], [place] [address], [place], [place] [address]    Report Status PENDING  Incomplete  Blood Culture (routine x 2)     Status: None (Preliminary result)   Collection Time: 11/12/19  7:21 PM   Specimen: BLOOD  Result Value Ref Range Status   Specimen Description BLOOD RIGHT FOREARM  Final   Special Requests   Final    BOTTLES DRAWN AEROBIC AND ANAEROBIC Blood Culture results may not be optimal due to an inadequate volume of blood received in culture bottles   Culture   Final    NO GROWTH < 12 HOURS Performed at [facility], [place], [place], [place] [address]    Report Status PENDING  Incomplete    Radiology Reports CT Head  Wo Contrast  Result Date: 10/28/2019 CLINICAL DATA:  Un witnessed fall, weakness, confusion EXAM: CT HEAD WITHOUT CONTRAST TECHNIQUE: Contiguous axial images were obtained from the base of the skull through the vertex without intravenous contrast. COMPARISON:  None. FINDINGS: Brain: Hypodensities in the periventricular white matter, most pronounced in the left frontal region, most likely reflect chronic small vessel ischemic change. No signs of acute infarct or hemorrhage. Lateral ventricles and remaining midline structures are unremarkable. No acute extra-axial fluid collections. No mass effect. Vascular: No hyperdense vessel or unexpected calcification. Skull: Normal. Negative for fracture or focal lesion. Sinuses/Orbits: Mucoperiosteal thickening within the maxillary sinuses, right greater than left. Remaining sinuses are clear. Other: None. IMPRESSION: 1. No acute intracranial trauma. 2. Likely chronic small vessel ischemic changes within the frontal periventricular white matter. Electronically Signed   By: Randa Ngo M.D.   On: 10/28/2019 19:31   DG Chest Port 1 View  Result Date: 11/12/2019 CLINICAL DATA:  Shortness of breath and sepsis. Fever. Productive cough. EXAM: PORTABLE CHEST 1 VIEW COMPARISON:  10/28/2019.  09/19/2019. FINDINGS: The patient has a history of pulmonary fibrosis and emphysema. Chronic lung markings appear similar allowing for technical differences. Upper lobe emphysema as seen previously. No sign of dense consolidation, collapse or effusion. Previous median sternotomy and CABG. Aortic atherosclerosis. No acute bone finding. IMPRESSION: Portable film with the poor inspiration. Chronic fibrotic lung disease and upper lung emphysema. No sign of acute infiltrate, collapse or effusion. Electronically Signed   By: Nelson Chimes M.D.   On: 11/12/2019 19:37   DG Chest Portable 1 View  Result Date: 10/28/2019 CLINICAL DATA:  79 year old male with altered mental status. EXAM: PORTABLE  CHEST 1 VIEW COMPARISON:  Chest radiograph dated 09/19/2019 and CT dated 09/19/2019 FINDINGS: Diffuse interstitial scarring in keeping with fibrosis. No new consolidation. There is no pleural effusion or pneumothorax. Stable cardiomediastinal silhouette. Atherosclerotic calcification of the aorta. Median sternotomy wires and CABG vascular clips. IMPRESSION: 1. No acute cardiopulmonary process. 2. Fibrotic changes. Electronically Signed   By: Anner Crete M.D.   On: 10/28/2019 21:16

## 2019-11-13 NOTE — Progress Notes (Signed)
5/10 Code sepsis. Discontinued ABX with COVID + dx.

## 2019-11-14 ENCOUNTER — Inpatient Hospital Stay (HOSPITAL_COMMUNITY): Payer: Medicare Other

## 2019-11-14 DIAGNOSIS — I340 Nonrheumatic mitral (valve) insufficiency: Secondary | ICD-10-CM

## 2019-11-14 LAB — COMPREHENSIVE METABOLIC PANEL
ALT: 18 U/L (ref 0–44)
AST: 39 U/L (ref 15–41)
Albumin: 2.7 g/dL — ABNORMAL LOW (ref 3.5–5.0)
Alkaline Phosphatase: 63 U/L (ref 38–126)
Anion gap: 9 (ref 5–15)
BUN: 52 mg/dL — ABNORMAL HIGH (ref 8–23)
CO2: 18 mmol/L — ABNORMAL LOW (ref 22–32)
Calcium: 7.4 mg/dL — ABNORMAL LOW (ref 8.9–10.3)
Chloride: 112 mmol/L — ABNORMAL HIGH (ref 98–111)
Creatinine, Ser: 2.32 mg/dL — ABNORMAL HIGH (ref 0.61–1.24)
GFR calc Af Amer: 30 mL/min — ABNORMAL LOW (ref >60)
GFR calc non Af Amer: 26 mL/min — ABNORMAL LOW (ref >60)
Glucose, Bld: 205 mg/dL — ABNORMAL HIGH (ref 70–99)
Potassium: 4.1 mmol/L (ref 3.5–5.1)
Sodium: 139 mmol/L (ref 135–145)
Total Bilirubin: 0.4 mg/dL (ref 0.3–1.2)
Total Protein: 5.7 g/dL — ABNORMAL LOW (ref 6.5–8.1)

## 2019-11-14 LAB — CBC WITH DIFFERENTIAL/PLATELET
Abs Immature Granulocytes: 0.06 10*3/uL (ref 0.00–0.07)
Basophils Absolute: 0 10*3/uL (ref 0.0–0.1)
Basophils Relative: 0 %
Eosinophils Absolute: 0 10*3/uL (ref 0.0–0.5)
Eosinophils Relative: 0 %
HCT: 29 % — ABNORMAL LOW (ref 39.0–52.0)
Hemoglobin: 9.4 g/dL — ABNORMAL LOW (ref 13.0–17.0)
Immature Granulocytes: 1 %
Lymphocytes Relative: 18 %
Lymphs Abs: 0.9 10*3/uL (ref 0.7–4.0)
MCH: 29.4 pg (ref 26.0–34.0)
MCHC: 32.4 g/dL (ref 30.0–36.0)
MCV: 90.6 fL (ref 80.0–100.0)
Monocytes Absolute: 0.3 10*3/uL (ref 0.1–1.0)
Monocytes Relative: 5 %
Neutro Abs: 3.7 10*3/uL (ref 1.7–7.7)
Neutrophils Relative %: 76 %
Platelets: 212 10*3/uL (ref 150–400)
RBC: 3.2 MIL/uL — ABNORMAL LOW (ref 4.22–5.81)
RDW: 15.6 % — ABNORMAL HIGH (ref 11.5–15.5)
WBC: 5 10*3/uL (ref 4.0–10.5)
nRBC: 0 % (ref 0.0–0.2)

## 2019-11-14 LAB — C-REACTIVE PROTEIN: CRP: 8.9 mg/dL — ABNORMAL HIGH (ref <1.0)

## 2019-11-14 LAB — ECHOCARDIOGRAM COMPLETE
Height: 68 in
Weight: 2448 oz

## 2019-11-14 LAB — HEPARIN LEVEL (UNFRACTIONATED)
Heparin Unfractionated: 2.1 IU/mL — ABNORMAL HIGH (ref 0.30–0.70)
Heparin Unfractionated: 1.5 IU/mL — ABNORMAL HIGH (ref 0.30–0.70)

## 2019-11-14 LAB — GLUCOSE, CAPILLARY
Glucose-Capillary: 165 mg/dL — ABNORMAL HIGH (ref 70–99)
Glucose-Capillary: 233 mg/dL — ABNORMAL HIGH (ref 70–99)
Glucose-Capillary: 216 mg/dL — ABNORMAL HIGH (ref 70–99)
Glucose-Capillary: 248 mg/dL — ABNORMAL HIGH (ref 70–99)

## 2019-11-14 LAB — BRAIN NATRIURETIC PEPTIDE: B Natriuretic Peptide: 605.4 pg/mL — ABNORMAL HIGH (ref 0.0–100.0)

## 2019-11-14 LAB — D-DIMER, QUANTITATIVE: D-Dimer, Quant: 1.43 ug/mL-FEU — ABNORMAL HIGH (ref 0.00–0.50)

## 2019-11-14 LAB — MAGNESIUM: Magnesium: 1.9 mg/dL (ref 1.7–2.4)

## 2019-11-14 MED ORDER — INSULIN GLARGINE 100 UNIT/ML ~~LOC~~ SOLN
14.0000 [IU] | Freq: Every day | SUBCUTANEOUS | Status: DC
  Administered 2019-11-14 – 2019-11-17 (×4): 14 [IU] via SUBCUTANEOUS
  Filled 2019-11-14 (×4): qty 0.14

## 2019-11-14 MED ORDER — HEPARIN (PORCINE) 25000 UT/250ML-% IV SOLN
500.0000 [IU]/h | INTRAVENOUS | Status: DC
  Administered 2019-11-14: 500 [IU]/h via INTRAVENOUS

## 2019-11-14 MED ORDER — AMLODIPINE BESYLATE 10 MG PO TABS
10.0000 mg | ORAL_TABLET | Freq: Every day | ORAL | Status: DC
  Administered 2019-11-14 – 2019-11-18 (×5): 10 mg via ORAL
  Filled 2019-11-14 (×5): qty 1

## 2019-11-14 MED ORDER — HEPARIN (PORCINE) 25000 UT/250ML-% IV SOLN
700.0000 [IU]/h | INTRAVENOUS | Status: DC
  Administered 2019-11-14: 700 [IU]/h via INTRAVENOUS
  Filled 2019-11-14: qty 250

## 2019-11-14 NOTE — Progress Notes (Signed)
ANTICOAGULATION CONSULT NOTE  Pharmacy Consult for Heparin Indication: chest pain/ACS  No Known Allergies  Patient Measurements: Height: 5\' 8"  (172.7 cm) Weight: 69.4 kg (153 lb) IBW/kg (Calculated) : 68.4 Heparin Dosing Weight: 69.4 kg  Vital Signs: Temp: 98.5 F (36.9 C) (05/12 0400) Temp Source: Axillary (05/12 0400) BP: 131/61 (05/12 0400) Pulse Rate: 60 (05/12 0400)  Labs: Recent Labs    11/12/19 1916 11/12/19 1916 11/12/19 1939 11/12/19 2132 11/13/19 0417 11/13/19 0417 11/13/19 0755 11/13/19 1842 11/14/19 0503  HGB 9.4*   < >   < >  --  8.8*   < > 9.0*  --  9.4*  HCT 29.3*   < >   < >  --  27.9*  --  28.7*  --  29.0*  PLT 200   < >  --   --  190  --  179  --  212  APTT 33  --   --   --   --   --   --   --   --   LABPROT 13.2  --   --   --   --   --   --   --   --   INR 1.0  --   --   --   --   --   --   --   --   HEPARINUNFRC  --   --   --   --   --   --   --  1.60* 2.10*  CREATININE 3.08*   < >  --   --  2.78*  --  2.51*  --  2.32*  TROPONINIHS 54*  --   --  61*  --   --  3,179*  --   --    < > = values in this interval not displayed.    Estimated Creatinine Clearance: 25.8 mL/min (A) (by C-G formula based on SCr of 2.32 mg/dL (H)).  Assessment:  79 yo male SOB and elevated troponin for heparin.  Heparin level remains supratherapeutic  Goal of Therapy:  Heparin level 0.3-0.7 units/ml Monitor platelets by anticoagulation protocol: Yes   Plan:  Hold heparin x 2 hours, then decrease heparin 700 units/hr Check heparin level in 8 hours.  Phillis Knack, PharmD, BCPS  11/14/2019,6:33 AM

## 2019-11-14 NOTE — Evaluation (Signed)
Physical Therapy Evaluation Patient Details Name: Robert Little MRN: [id number] DOB: Jun 25, 1941 Today's Date: 11/14/2019   History of Present Illness  79 y.o. male with medical history significant for chronic anemia, pulmonary fibrosis, hypertension, coronary artery disease, chronic diastolic CHF, and schizoaffective disorder, presenting to the emergency department with progressive shortness of breath long with severe orthopnea starting 1 to 2 days prior to hospital visit, in the hospital work-up suggestive of CHF and acute hypoxic respiratory failure and he was admitted 09/16/19  Clinical Impression  Pt was able to stand EOB with heavy min assist and RW.  He is unsteady on his feet and did not feel stable enough to walk around the room.  O2 sats decreased to 84% during mobility on 3 L O2 [place], rebounded into the 90s in < 3 mins with seated rest.  Pt reports he has help for all mobility and ADLs at his group home.  He wears 2 L O2 [place] at baseline PTA.   PT to follow acutely for deficits listed below.    Follow Up Recommendations Home health PT;Supervision for mobility/OOB    Equipment Recommendations  None recommended by PT    Recommendations for Other Services   NA    Precautions / Restrictions Precautions Precautions: Fall;Other (comment) Precaution Comments: monitor O2      Mobility  Bed Mobility Overal bed mobility: Needs Assistance Bed Mobility: Supine to Sit;Sit to Supine     Supine to sit: Min assist;HOB elevated Sit to supine: Supervision   General bed mobility comments: Min hand held assist to come up to sitting EOB, supervision to return to supine, pt able to lift his legs back on his own.  Heavy use of bed rail and HOB 35 degrees.   Transfers Overall transfer level: Needs assistance Equipment used: Rolling walker (2 wheeled) Transfers: Sit to/from Stand Sit to Stand: Min assist         General transfer comment: Heavy min assist to come to standing, posterior  preference.  Pt able to march in place, but did not feel up to walking any distance due to weakness.  Pt desaturated to 84% on 3 L O2 [place], but rebounded back to 90s quickly with seated rest (<3 mins).           Balance Overall balance assessment: Needs assistance Sitting-balance support: Feet supported;No upper extremity supported Sitting balance-Leahy Scale: Good   Postural control: Posterior lean Standing balance support: Bilateral upper extremity supported Standing balance-Leahy Scale: Poor Standing balance comment: posterior preference in initial stand, needs support from RW and therapist.                              Pertinent Vitals/Pain Pain Assessment: No/denies pain    Home Living Family/patient expects to be discharged to:: Group home                      Prior Function Level of Independence: Needs assistance   Gait / Transfers Assistance Needed: per pt report he uses a RW and 2 L O2 [place] at baseline.  He gets assistance for ADLs and gait.            Hand Dominance   Dominant Hand: Right    Extremity/Trunk Assessment   Upper Extremity Assessment Upper Extremity Assessment: Generalized weakness    Lower Extremity Assessment Lower Extremity Assessment: Generalized weakness    Cervical / Trunk Assessment Cervical / Trunk Assessment:  Kyphotic  Communication   Communication: HOH  Cognition Arousal/Alertness: Awake/alert Behavior During Therapy: WFL for tasks assessed/performed Overall Cognitive Status: History of cognitive impairments - at baseline                                 General Comments: He is likely close to his baseline.             Assessment/Plan    PT Assessment Patient needs continued PT services  PT Problem List Decreased strength;Decreased activity tolerance;Decreased balance;Decreased mobility;Cardiopulmonary status limiting activity       PT Treatment Interventions DME instruction;Gait  training;Functional mobility training;Therapeutic activities;Therapeutic exercise;Balance training;Stair training;Patient/family education    PT Goals (Current goals can be found in the Care Plan section)  Acute Rehab PT Goals Patient Stated Goal: to get better and return home PT Goal Formulation: With patient Time For Goal Achievement: 11/28/19 Potential to Achieve Goals: Good    Frequency Min 3X/week           AM-PAC PT "6 Clicks" Mobility  Outcome Measure Help needed turning from your back to your side while in a flat bed without using bedrails?: A Little Help needed moving from lying on your back to sitting on the side of a flat bed without using bedrails?: A Little Help needed moving to and from a bed to a chair (including a wheelchair)?: A Little Help needed standing up from a chair using your arms (e.g., wheelchair or bedside chair)?: A Little Help needed to walk in hospital room?: A Lot Help needed climbing 3-5 steps with a railing? : Total 6 Click Score: 15    End of Session Equipment Utilized During Treatment: Oxygen Activity Tolerance: Patient limited by fatigue Patient left: in bed;with call bell/phone within reach;with bed alarm set Nurse Communication: Mobility status;Other (comment)(IV may be infiltrated) PT Visit Diagnosis: Muscle weakness (generalized) (M62.81);Difficulty in walking, not elsewhere classified (R26.2)    Time: 5974-7185 PT Time Calculation (min) (ACUTE ONLY): 14 min   Charges:         Verdene Lennert, PT, DPT  Acute Rehabilitation [phone] pager #[phone] office     PT Evaluation $PT Eval Moderate Complexity: 1 Mod         11/14/2019, 4:59 PM

## 2019-11-14 NOTE — Progress Notes (Signed)
PT Cancellation Note  Patient Details Name: Robert Little MRN: [id number] DOB: [birth year]   Cancelled Treatment:    Reason Eval/Treat Not Completed: Patient at procedure or test/unavailable.  Echo in room.  PT to check back later as time and schedule allow.   Thanks,    Harvie Heck 11/14/2019, 12:56 PM

## 2019-11-14 NOTE — Progress Notes (Addendum)
ANTICOAGULATION CONSULT NOTE   Pharmacy Consult for Heparin Indication: chest pain/ACS  No Known Allergies  Patient Measurements: Height: 5\' 8"  (172.7 cm) Weight: 69.4 kg (153 lb) IBW/kg (Calculated) : 68.4 Heparin Dosing Weight: 69.4 kg  Vital Signs: Temp: 98.9 F (37.2 C) (05/12 1157) Temp Source: Oral (05/12 1157) BP: 120/58 (05/12 1701) Pulse Rate: 70 (05/12 1701)  Labs: Recent Labs    11/12/19 1916 11/12/19 1916 11/12/19 1939 11/12/19 2132 11/13/19 0417 11/13/19 0417 11/13/19 0755 11/13/19 1842 11/14/19 0503 11/14/19 1644  HGB 9.4*   < >   < >  --  8.8*   < > 9.0*  --  9.4*  --   HCT 29.3*   < >   < >  --  27.9*  --  28.7*  --  29.0*  --   PLT 200   < >  --   --  190  --  179  --  212  --   APTT 33  --   --   --   --   --   --   --   --   --   LABPROT 13.2  --   --   --   --   --   --   --   --   --   INR 1.0  --   --   --   --   --   --   --   --   --   HEPARINUNFRC  --   --   --   --   --   --   --  1.60* 2.10* 1.50*  CREATININE 3.08*   < >  --   --  2.78*  --  2.51*  --  2.32*  --   TROPONINIHS 54*  --   --  61*  --   --  3,179*  --   --   --    < > = values in this interval not displayed.    Estimated Creatinine Clearance: 25.8 mL/min (A) (by C-G formula based on SCr of 2.32 mg/dL (H)).  Assessment: 79 yr old male with COVID, SOB and elevated troponin was started on IV heparin. Per Cardiology, elevated troponin is likely due to COVID sepsis, but since ACS cannot be excluded, plan to treat with IV heparin for 48 hrs. Pt was not on anticoagulant PTA.   Heparin level ~8 hrs after heparin infusion was decreased to 700 units/hr was 1.50 units/ml, which remains above the goal range for this pt, despite a heparin rate decrease. CBC stable. I contacted lab to see if the heparin level was drawn from opposite arm from arm in which the IV was infusing, but the phlebotomist who drew this heparin level had left for the day. Per RN, no issues with IV. He did observe some  bleeding from an old IV site.  Goal of Therapy:  Heparin level 0.3-0.7 units/ml Monitor platelets by anticoagulation protocol: Yes   Plan:  Hold heparin x 1 hour, then restart infusion at 500 units/hr Check 8-hr heparin level Monitor daily heparin level, CBC Monitor for signs/symptoms of bleeding  Gillermina Hu, PharmD, BCPS, [facility] Clinical Pharmacist 11/14/2019,6:15 PM

## 2019-11-14 NOTE — Plan of Care (Signed)

## 2019-11-14 NOTE — Progress Notes (Signed)
  Echocardiogram 2D Echocardiogram has been performed.  Michiel Cowboy 11/14/2019, 11:48 AM

## 2019-11-14 NOTE — Progress Notes (Signed)
PROGRESS NOTE                                                                                                                                                                                                             Patient Demographics:    Robert Little, is a 79 y.o. male, DOB - 27-Aug-1940, GBT:[id number]  Outpatient Primary MD for the patient is [name]    LOS - 2  Admit date - 11/12/2019    Chief Complaint  Patient presents with  . Shortness of Breath       Brief Narrative Robert Little is a 79 y.o. male with history of his affective disorder, CAD, pulmonary fibrosis on home oxygen usually 2 L anemia hypertension diabetes mellitus recently admitted for hypoglycemia with use of sulfonylureas in the setting of chronic kidney disease was brought to the ER after patient has been having productive cough over the last 3 days feeling weak and tired not eating well.  ER he was diagnosed with acute hypoxic respiratory failure due to COVID-19 pneumonia, dehydration AKI and NSTEMI.   Subjective:   Patient in bed, appears comfortable, denies any headache, no fever, no chest pain or pressure, improved shortness of breath , no abdominal pain. No focal weakness.    Assessment  & Plan :    1. Sepsis with Acute Hypoxic Resp. Failure due to Acute Covid 19 Viral Pneumonitis during the ongoing 2020 Covid 19 Pandemic - he has moderate disease has been started on IV steroids and remdesivir, will monitor closely.  Poor candidate for Actemra due to underlying frail status.  Sepsis pathophysiology seems to have resolved.  Encouraged the patient to sit up in chair in the daytime use I-S and flutter valve for pulmonary toiletry and then prone in bed when at night.  Will advance activity and titrate down oxygen as possible.  SpO2: 97 % O2 Flow Rate (L/min): 1 L/min  Recent Labs  Lab 11/12/19 1916 11/12/19 1917  11/13/19 0755 11/14/19 0503  CRP  --   --  7.0* 8.9*  DDIMER  --   --  1.75*  1.60* 1.43*  BNP  --  155.7* 689.0* 605.4*  PROCALCITON <0.10  --  <0.10  --   SARSCOV2NAA POSITIVE*  --   --   --     Hepatic Function  Latest Ref Rng & Units 11/14/2019 11/13/2019 11/12/2019  Total Protein 6.5 - 8.1 g/dL 5.7(L) 5.7(L) 6.7  Albumin 3.5 - 5.0 g/dL 2.7(L) 2.8(L) 3.3(L)  AST 15 - 41 U/L 39 34 25  ALT 0 - 44 U/L 18 16 17   Alk Phosphatase 38 - 126 U/L 63 58 65  Total Bilirubin 0.3 - 1.2 mg/dL 0.4 0.6 0.7    2.  Dehydration with AKI on underlying CKD 4 with baseline creatinine of around 2.5. Now close to baseline after hydration with IV fluids.  3.  History of CAD.  Possible NSTEMI.  Could be due to demand ischemia from hypoxia, continue aspirin, statin and beta-blocker for secondary prevention, pending echocardiogram, heparin drip for 48 hours and cardiology input.  EKG stable and chest pain-free.  4. History of schizoaffective disorder takes monthly injection of Invega and daily on Klonopin and Cogentin.  5. History of pulmonary fibrosis now on 2 L home oxygen.  Continue supportive care with a liters as needed.  Outpatient pulmonary follow-up.  6.  Anemia of chronic disease.  Stable will monitor.  7.  Chronic diastolic CHF.  EF on recent echocardiogram was 60%.  Seems to be close to baseline, will resume Lasix from 11/15/2019 if renal function remains stable..  8.  Chronically elevated D-dimers.  Previously has had negative lower extremity venous duplex, currently on heparin drip, will trend and monitor.    9.  Essential hypertension.  On Coreg will add Norvasc for better control.    10. DM type II.  For now sliding scale, will give him low-dose Lantus as CBGs are running high due to steroid use.   Lab Results  Component Value Date   HGBA1C 7.0 (H) 09/20/2019   CBG (last 3)  Recent Labs    11/13/19 2115 11/14/19 0740 11/14/19 1138  GLUCAP 222* 165* 233*     Condition - Extremely  Guarded  Family Communication  :  None  Code Status :  Full  Consults  :  Cards  Disposition Plan  :    Status is: Inpatient  Remains inpatient appropriate because:Inpatient level of care appropriate due to severity of illness   Dispo: The patient is from: Group home              Anticipated d/c is to: Group home              Anticipated d/c date is: 3 days              Patient currently is not medically stable to d/c.  Will need completion of COVID-19 infection and acute hypoxic respiratory failure.   Procedures  :    TTE pending  PUD Prophylaxis :  None  DVT Prophylaxis  :   Heparin   Lab Results  Component Value Date   PLT 212 11/14/2019    Diet :  Diet Order            Diet heart healthy/carb modified Room service appropriate? Yes; Fluid consistency: Thin  Diet effective now               Inpatient Medications  Scheduled Meds: . albuterol  2 puff Inhalation Q2200  . aspirin EC  81 mg Oral Daily  . atorvastatin  20 mg Oral QHS  . benztropine  0.5 mg Oral BID  . carvedilol  3.125 mg Oral BID WC  . clonazePAM  0.5 mg Oral BID  . dexamethasone (DECADRON) injection  6 mg Intravenous Q24H  .  ferrous sulfate  325 mg Oral BID WC  . fluticasone furoate-vilanterol  1 puff Inhalation Daily  . insulin aspart  0-6 Units Subcutaneous TID WC  . pantoprazole  40 mg Oral Daily  . tamsulosin  0.4 mg Oral Daily  . umeclidinium bromide  1 puff Inhalation Daily  . vitamin B-12  500 mcg Oral BID   Continuous Infusions: . heparin 700 Units/hr (11/14/19 0831)  . remdesivir 100 mg in NS 100 mL Stopped (11/13/19 1848)   PRN Meds:.[DISCONTINUED] ondansetron **OR** ondansetron (ZOFRAN) IV  Antibiotics  :    Anti-infectives (From admission, onward)   Start     Dose/Rate Route Frequency Ordered Stop   11/13/19 1600  remdesivir 100 mg in sodium chloride 0.9 % 100 mL IVPB     100 mg 200 mL/hr over 30 Minutes Intravenous Daily 11/12/19 2146 11/17/19 1559   11/12/19 2300   remdesivir 200 mg in sodium chloride 0.9% 250 mL IVPB     200 mg 580 mL/hr over 30 Minutes Intravenous Once 11/12/19 2146 11/13/19 0104   11/12/19 2130  cefTRIAXone (ROCEPHIN) 2 g in sodium chloride 0.9 % 100 mL IVPB  Status:  Discontinued     2 g 200 mL/hr over 30 Minutes Intravenous  Once 11/12/19 2056 11/12/19 2125   11/12/19 2130  azithromycin (ZITHROMAX) 500 mg in sodium chloride 0.9 % 250 mL IVPB  Status:  Discontinued     500 mg 250 mL/hr over 60 Minutes Intravenous  Once 11/12/19 2056 11/12/19 2125       Time Spent in minutes  30   Lala Lund M.D on 11/14/2019 at 11:47 AM  To page go to [email or web address] - password [facility]  [facility] -  Office  [phone]  See all Orders from today for further details    Objective:   Vitals:   11/14/19 0400 11/14/19 0743 11/14/19 0800 11/14/19 1139  BP: 131/61  137/69 (!) 164/57  Pulse: 60 62 71 (!) 59  Resp: (!) 23  (!) 22 (!) 27  Temp: 98.5 F (36.9 C)   98.3 F (36.8 C)  TempSrc: Axillary   Oral  SpO2: 96%  94% 97%  Weight:      Height:        Wt Readings from Last 3 Encounters:  11/13/19 69.4 kg  10/28/19 69.4 kg  10/10/19 69.4 kg     Intake/Output Summary (Last 24 hours) at 11/14/2019 1147 Last data filed at 11/14/2019 9758 Gross per 24 hour  Intake 539.65 ml  Output 1320 ml  Net -780.35 ml     Physical Exam  Awake Alert, No new F.N deficits, Normal affect Carter.AT,PERRAL Supple Neck,No JVD, No cervical lymphadenopathy appriciated.  Symmetrical Chest wall movement, Good air movement bilaterally, CTAB RRR,No Gallops, Rubs or new Murmurs, No Parasternal Heave +ve B.Sounds, Abd Soft, No tenderness, No organomegaly appriciated, No rebound - guarding or rigidity. No Cyanosis, Clubbing or edema, No new Rash or bruise        Data Review:    CBC Recent Labs  Lab 11/12/19 1916 11/12/19 1939 11/13/19 0417 11/13/19 0755 11/14/19 0503  WBC 5.2  --  5.1 4.4 5.0  HGB 9.4* 9.5* 8.8* 9.0* 9.4*  HCT 29.3*  28.0* 27.9* 28.7* 29.0*  PLT 200  --  190 179 212  MCV 91.0  --  91.2 93.2 90.6  MCH 29.2  --  28.8 29.2 29.4  MCHC 32.1  --  31.5 31.4 32.4  RDW 15.3  --  15.5 15.5  15.6*  LYMPHSABS 0.9  --   --   --  0.9  MONOABS 0.3  --   --   --  0.3  EOSABS 0.0  --   --   --  0.0  BASOSABS 0.0  --   --   --  0.0    Chemistries  Recent Labs  Lab 11/12/19 1916 11/12/19 1939 11/13/19 0417 11/13/19 0755 11/14/19 0503  NA 137 137  --  142 139  K 4.5 4.3  --  3.8 4.1  CL 103  --   --  112* 112*  CO2 17*  --   --  17* 18*  GLUCOSE 181*  --   --  112* 205*  BUN 67*  --   --  54* 52*  CREATININE 3.08*  --  2.78* 2.51* 2.32*  CALCIUM 8.0*  --   --  7.0* 7.4*  AST 25  --   --  34 39  ALT 17  --   --  16 18  ALKPHOS 65  --   --  58 63  BILITOT 0.7  --   --  0.6 0.4  MG  --   --   --  0.8* 1.9  INR 1.0  --   --   --   --      ------------------------------------------------------------------------------------------------------------------ No results for input(s): CHOL, HDL, LDLCALC, TRIG, CHOLHDL, LDLDIRECT in the last 72 hours.  Lab Results  Component Value Date   HGBA1C 7.0 (H) 09/20/2019   ------------------------------------------------------------------------------------------------------------------ No results for input(s): TSH, T4TOTAL, T3FREE, THYROIDAB in the last 72 hours.  Invalid input(s): FREET3  Cardiac Enzymes No results for input(s): CKMB, TROPONINI, MYOGLOBIN in the last 168 hours.  Invalid input(s): CK ------------------------------------------------------------------------------------------------------------------    Component Value Date/Time   BNP 605.4 (H) 11/14/2019 0503    Micro Results Recent Results (from the past 240 hour(s))  SARS Coronavirus 2 by RT PCR (hospital order, performed in [facility] hospital lab) Nasopharyngeal Nasopharyngeal Swab     Status: Abnormal   Collection Time: 11/12/19  7:16 PM   Specimen: Nasopharyngeal Swab  Result Value Ref  Range Status   SARS Coronavirus 2 POSITIVE (A) NEGATIVE Final    Comment: RESULT CALLED TO, READ BACK BY AND VERIFIED WITH: T KELLY RN 11/12/19 2119 JDW (NOTE) SARS-CoV-2 target nucleic acids are DETECTED SARS-CoV-2 RNA is generally detectable in upper respiratory specimens  during the acute phase of infection.  Positive results are indicative  of the presence of the identified virus, but do not rule out bacterial infection or co-infection with other pathogens not detected by the test.  Clinical correlation with patient history and  other diagnostic information is necessary to determine patient infection status.  The expected result is negative. Fact Sheet for Patients:   StrictlyIdeas.no  Fact Sheet for Healthcare Providers:   BankingDealers.co.za   This test is not yet approved or cleared by the [place] FDA and  has been authorized for detection and/or diagnosis of SARS-CoV-2 by FDA under an Emergency Use Authorization (EUA).  This EUA will remain in effect (meaning this test can be used)  for the duration of  the COVID-19 declaration under Section 564(b)(1) of the Act, 21 U.S.C. section 360-bbb-3(b)(1), unless the authorization is terminated or revoked sooner. Performed at [facility], [place] [address] [place], [place] [address]   Blood Culture (routine x 2)     Status: None (Preliminary result)   Collection Time: 11/12/19  7:16 PM  Specimen: BLOOD  Result Value Ref Range Status   Specimen Description BLOOD LEFT FOREARM  Final   Special Requests   Final    BOTTLES DRAWN AEROBIC AND ANAEROBIC Blood Culture results may not be optimal due to an excessive volume of blood received in culture bottles   Culture   Final    NO GROWTH 2 DAYS Performed at [facility], [place] [address] [place], [place] [address]    Report Status PENDING  Incomplete  Urine culture     Status: None   Collection Time: 11/12/19  7:16 PM    Specimen: In/Out Cath Urine  Result Value Ref Range Status   Specimen Description IN/OUT CATH URINE  Final   Special Requests NONE  Final   Culture   Final    NO GROWTH Performed at [facility], [place] [address], [place], [place] [address]    Report Status 11/13/2019 FINAL  Final  Blood Culture (routine x 2)     Status: None (Preliminary result)   Collection Time: 11/12/19  7:21 PM   Specimen: BLOOD  Result Value Ref Range Status   Specimen Description BLOOD RIGHT FOREARM  Final   Special Requests   Final    BOTTLES DRAWN AEROBIC AND ANAEROBIC Blood Culture results may not be optimal due to an inadequate volume of blood received in culture bottles   Culture   Final    NO GROWTH 2 DAYS Performed at [facility], [place] [address] [place], [place] [address]    Report Status PENDING  Incomplete    Radiology Reports CT Head Wo Contrast  Result Date: 10/28/2019 CLINICAL DATA:  Un witnessed fall, weakness, confusion EXAM: CT HEAD WITHOUT CONTRAST TECHNIQUE: Contiguous axial images were obtained from the base of the skull through the vertex without intravenous contrast. COMPARISON:  None. FINDINGS: Brain: Hypodensities in the periventricular white matter, most pronounced in the left frontal region, most likely reflect chronic small vessel ischemic change. No signs of acute infarct or hemorrhage. Lateral ventricles and remaining midline structures are unremarkable. No acute extra-axial fluid collections. No mass effect. Vascular: No hyperdense vessel or unexpected calcification. Skull: Normal. Negative for fracture or focal lesion. Sinuses/Orbits: Mucoperiosteal thickening within the maxillary sinuses, right greater than left. Remaining sinuses are clear. Other: None. IMPRESSION: 1. No acute intracranial trauma. 2. Likely chronic small vessel ischemic changes within the frontal periventricular white matter. Electronically Signed   By: Randa Ngo M.D.   On: 10/28/2019 19:31   DG Chest  Port 1 View  Result Date: 11/12/2019 CLINICAL DATA:  Shortness of breath and sepsis. Fever. Productive cough. EXAM: PORTABLE CHEST 1 VIEW COMPARISON:  10/28/2019.  09/19/2019. FINDINGS: The patient has a history of pulmonary fibrosis and emphysema. Chronic lung markings appear similar allowing for technical differences. Upper lobe emphysema as seen previously. No sign of dense consolidation, collapse or effusion. Previous median sternotomy and CABG. Aortic atherosclerosis. No acute bone finding. IMPRESSION: Portable film with the poor inspiration. Chronic fibrotic lung disease and upper lung emphysema. No sign of acute infiltrate, collapse or effusion. Electronically Signed   By: Nelson Chimes M.D.   On: 11/12/2019 19:37   DG Chest Portable 1 View  Result Date: 10/28/2019 CLINICAL DATA:  79 year old male with altered mental status. EXAM: PORTABLE CHEST 1 VIEW COMPARISON:  Chest radiograph dated 09/19/2019 and CT dated 09/19/2019 FINDINGS: Diffuse interstitial scarring in keeping with fibrosis. No new consolidation. There is no pleural effusion or pneumothorax. Stable cardiomediastinal silhouette.  Atherosclerotic calcification of the aorta. Median sternotomy wires and CABG vascular clips. IMPRESSION: 1. No acute cardiopulmonary process. 2. Fibrotic changes. Electronically Signed   By: Anner Crete M.D.   On: 10/28/2019 21:16

## 2019-11-15 LAB — COMPREHENSIVE METABOLIC PANEL
ALT: 21 U/L (ref 0–44)
AST: 34 U/L (ref 15–41)
Albumin: 2.8 g/dL — ABNORMAL LOW (ref 3.5–5.0)
Alkaline Phosphatase: 63 U/L (ref 38–126)
Anion gap: 11 (ref 5–15)
BUN: 53 mg/dL — ABNORMAL HIGH (ref 8–23)
CO2: 20 mmol/L — ABNORMAL LOW (ref 22–32)
Calcium: 8.2 mg/dL — ABNORMAL LOW (ref 8.9–10.3)
Chloride: 111 mmol/L (ref 98–111)
Creatinine, Ser: 2.08 mg/dL — ABNORMAL HIGH (ref 0.61–1.24)
GFR calc Af Amer: 35 mL/min — ABNORMAL LOW (ref >60)
GFR calc non Af Amer: 30 mL/min — ABNORMAL LOW (ref >60)
Glucose, Bld: 167 mg/dL — ABNORMAL HIGH (ref 70–99)
Potassium: 4.1 mmol/L (ref 3.5–5.1)
Sodium: 142 mmol/L (ref 135–145)
Total Bilirubin: 0.7 mg/dL (ref 0.3–1.2)
Total Protein: 5.9 g/dL — ABNORMAL LOW (ref 6.5–8.1)

## 2019-11-15 LAB — HEPARIN LEVEL (UNFRACTIONATED): Heparin Unfractionated: 1.04 IU/mL — ABNORMAL HIGH (ref 0.30–0.70)

## 2019-11-15 LAB — CBC WITH DIFFERENTIAL/PLATELET
Abs Immature Granulocytes: 0.13 10*3/uL — ABNORMAL HIGH (ref 0.00–0.07)
Basophils Absolute: 0 10*3/uL (ref 0.0–0.1)
Basophils Relative: 0 %
Eosinophils Absolute: 0 10*3/uL (ref 0.0–0.5)
Eosinophils Relative: 0 %
HCT: 27.5 % — ABNORMAL LOW (ref 39.0–52.0)
Hemoglobin: 8.8 g/dL — ABNORMAL LOW (ref 13.0–17.0)
Immature Granulocytes: 1 %
Lymphocytes Relative: 10 %
Lymphs Abs: 1.1 10*3/uL (ref 0.7–4.0)
MCH: 28.8 pg (ref 26.0–34.0)
MCHC: 32 g/dL (ref 30.0–36.0)
MCV: 89.9 fL (ref 80.0–100.0)
Monocytes Absolute: 0.5 10*3/uL (ref 0.1–1.0)
Monocytes Relative: 5 %
Neutro Abs: 9.3 10*3/uL — ABNORMAL HIGH (ref 1.7–7.7)
Neutrophils Relative %: 84 %
Platelets: 266 10*3/uL (ref 150–400)
RBC: 3.06 MIL/uL — ABNORMAL LOW (ref 4.22–5.81)
RDW: 15.8 % — ABNORMAL HIGH (ref 11.5–15.5)
WBC: 11 10*3/uL — ABNORMAL HIGH (ref 4.0–10.5)
nRBC: 0 % (ref 0.0–0.2)

## 2019-11-15 LAB — GLUCOSE, CAPILLARY
Glucose-Capillary: 165 mg/dL — ABNORMAL HIGH (ref 70–99)
Glucose-Capillary: 286 mg/dL — ABNORMAL HIGH (ref 70–99)
Glucose-Capillary: 223 mg/dL — ABNORMAL HIGH (ref 70–99)

## 2019-11-15 LAB — D-DIMER, QUANTITATIVE: D-Dimer, Quant: 1.04 ug/mL-FEU — ABNORMAL HIGH (ref 0.00–0.50)

## 2019-11-15 LAB — BRAIN NATRIURETIC PEPTIDE: B Natriuretic Peptide: 255.2 pg/mL — ABNORMAL HIGH (ref 0.0–100.0)

## 2019-11-15 LAB — MAGNESIUM: Magnesium: 1.7 mg/dL (ref 1.7–2.4)

## 2019-11-15 LAB — C-REACTIVE PROTEIN: CRP: 5.3 mg/dL — ABNORMAL HIGH (ref <1.0)

## 2019-11-15 MED ORDER — HEPARIN SODIUM (PORCINE) 5000 UNIT/ML IJ SOLN
5000.0000 [IU] | Freq: Three times a day (TID) | INTRAMUSCULAR | Status: DC
  Administered 2019-11-15 – 2019-11-30 (×46): 5000 [IU] via SUBCUTANEOUS
  Filled 2019-11-15 (×44): qty 1

## 2019-11-15 MED ORDER — POTASSIUM CHLORIDE CRYS ER 20 MEQ PO TBCR
20.0000 meq | EXTENDED_RELEASE_TABLET | Freq: Every day | ORAL | Status: DC
  Administered 2019-11-15 – 2019-11-19 (×5): 20 meq via ORAL
  Filled 2019-11-15 (×5): qty 1

## 2019-11-15 MED ORDER — MAGNESIUM SULFATE IN D5W 1-5 GM/100ML-% IV SOLN
1.0000 g | Freq: Once | INTRAVENOUS | Status: AC
  Administered 2019-11-15: 1 g via INTRAVENOUS
  Filled 2019-11-15: qty 100

## 2019-11-15 MED ORDER — FUROSEMIDE 40 MG PO TABS
40.0000 mg | ORAL_TABLET | Freq: Every day | ORAL | Status: DC
  Administered 2019-11-15 – 2019-11-16 (×2): 40 mg via ORAL
  Filled 2019-11-15 (×2): qty 1

## 2019-11-15 NOTE — Progress Notes (Signed)
PROGRESS NOTE                                                                                                                                                                                                             Patient Demographics:    Robert Little, is a 79 y.o. male, DOB - 25-May-1941, DQQ:[id number]  Outpatient Primary MD for the patient is [facility], [name]    LOS - 3  Admit date - 11/12/2019    Chief Complaint  Patient presents with  . Shortness of Breath       Brief Narrative Robert Little is a 79 y.o. male with history of his affective disorder, CAD, pulmonary fibrosis on home oxygen usually 2 L anemia hypertension diabetes mellitus recently admitted for hypoglycemia with use of sulfonylureas in the setting of chronic kidney disease was brought to the ER after patient has been having productive cough over the last 3 days feeling weak and tired not eating well.  ER he was diagnosed with acute hypoxic respiratory failure due to COVID-19 pneumonia, dehydration AKI and NSTEMI.   Subjective:   Patient in bed, appears comfortable, denies any headache, no fever, no chest pain or pressure, no shortness of breath , no abdominal pain. No focal weakness.   Assessment  & Plan :    1. Sepsis with Acute Hypoxic Resp. Failure due to Acute Covid 19 Viral Pneumonitis during the ongoing 2020 Covid 19 Pandemic - he has moderate disease has been started on IV steroids and remdesivir, will monitor closely.  Poor candidate for Actemra due to underlying frail status.  Sepsis pathophysiology seems to have resolved.  Note he uses 2 L nasal cannula oxygen at baseline.  Encouraged the patient to sit up in chair in the daytime use I-S and flutter valve for pulmonary toiletry and then prone in bed when at night.  Will advance activity and titrate down oxygen as possible.  SpO2: 92 % O2 Flow Rate (L/min): 3 L/min  Recent  Labs  Lab 11/12/19 1916 11/12/19 1917 11/13/19 0755 11/14/19 0503 11/15/19 0622  CRP  --   --  7.0* 8.9* 5.3*  DDIMER  --   --  1.75*  1.60* 1.43* 1.04*  BNP  --  155.7* 689.0* 605.4* 255.2*  PROCALCITON <0.10  --  <0.10  --   --  SARSCOV2NAA POSITIVE*  --   --   --   --     Hepatic Function Latest Ref Rng & Units 11/15/2019 11/14/2019 11/13/2019  Total Protein 6.5 - 8.1 g/dL 5.9(L) 5.7(L) 5.7(L)  Albumin 3.5 - 5.0 g/dL 2.8(L) 2.7(L) 2.8(L)  AST 15 - 41 U/L 34 39 34  ALT 0 - 44 U/L 21 18 16   Alk Phosphatase 38 - 126 U/L 63 63 58  Total Bilirubin 0.3 - 1.2 mg/dL 0.7 0.4 0.6    2.  Dehydration with AKI on underlying CKD 4 with baseline creatinine of around 2.5. Now close to baseline after hydration with IV fluids.  3.  History of CAD. Not NSTEMI.  Could be due to demand ischemia from hypoxia, continue aspirin, statin and beta-blocker for secondary prevention, stable echocardiogram, stable EKG and chest pain-free seen by cardiology continue present care with outpatient cardiology follow-up post discharge.  4. History of schizoaffective disorder takes monthly injection of Invega and daily on Klonopin and Cogentin.  5. History of pulmonary fibrosis now on 2 L home oxygen.  Continue supportive care with a liters as needed.  Outpatient pulmonary follow-up.  6.  Anemia of chronic disease.  Stable will monitor.  7.  Chronic diastolic CHF.  EF on recent echocardiogram was 60%.  Appears compensated we will resume home dose Lasix.  8.  Chronically elevated D-dimers.  Previously has had negative lower extremity venous duplex, currently on heparin drip, will trend and monitor.    9.  Essential hypertension.  On Coreg will add Norvasc for better control.    10. DM type II.  For now sliding scale, addedm low-dose Lantus as CBGs are running high due to steroid use.   Lab Results  Component Value Date   HGBA1C 7.0 (H) 09/20/2019   CBG (last 3)  Recent Labs    11/14/19 1606  11/14/19 2116 11/15/19 0753  GLUCAP 216* 248* 165*     Condition - Extremely Guarded  Family Communication  :  None  Code Status :  Full  Consults  :  Cards  Disposition Plan  :    Status is: Inpatient  Remains inpatient appropriate because:Inpatient level of care appropriate due to severity of illness   Dispo: The patient is from: Group home              Anticipated d/c is to: Group home              Anticipated d/c date is: 3 days              Patient currently is not medically stable to d/c.  Will need completion of COVID-19 infection and acute hypoxic respiratory failure.   Procedures  :    TTE - 1. Left ventricular ejection fraction, by estimation, is 60 to 65%. The left ventricle has normal function. The left ventricle has no regional wall motion abnormalities. There is mild concentric left ventricular hypertrophy. Left ventricular diastolic parameters are consistent with Grade II diastolic dysfunction  (pseudonormalization). Elevated left atrial pressure.  2. Right ventricular systolic function is normal. The right ventricular size is normal. There is normal pulmonary artery systolic pressure.  3. Left atrial size was mildly dilated.  4. The mitral valve is normal in structure. Mild mitral valve regurgitation. No evidence of mitral stenosis.  5. The aortic valve is normal in structure. Aortic valve regurgitation is not visualized. No aortic stenosis is present.  6. The inferior vena cava is normal in size with  greater than 50% respiratory variability, suggesting right atrial pressure of 3 mmHg.   PUD Prophylaxis :  None  DVT Prophylaxis  :   Heparin   Lab Results  Component Value Date   PLT 266 11/15/2019    Diet :  Diet Order            Diet heart healthy/carb modified Room service appropriate? Yes; Fluid consistency: Thin  Diet effective now               Inpatient Medications  Scheduled Meds: . albuterol  2 puff Inhalation Q2200  .  amLODipine  10 mg Oral Daily  . aspirin EC  81 mg Oral Daily  . atorvastatin  20 mg Oral QHS  . benztropine  0.5 mg Oral BID  . carvedilol  3.125 mg Oral BID WC  . clonazePAM  0.5 mg Oral BID  . dexamethasone (DECADRON) injection  6 mg Intravenous Q24H  . ferrous sulfate  325 mg Oral BID WC  . fluticasone furoate-vilanterol  1 puff Inhalation Daily  . insulin aspart  0-6 Units Subcutaneous TID WC  . insulin glargine  14 Units Subcutaneous Daily  . pantoprazole  40 mg Oral Daily  . tamsulosin  0.4 mg Oral Daily  . umeclidinium bromide  1 puff Inhalation Daily  . vitamin B-12  500 mcg Oral BID   Continuous Infusions: . remdesivir 100 mg in NS 100 mL Stopped (11/14/19 1621)   PRN Meds:.[DISCONTINUED] ondansetron **OR** ondansetron (ZOFRAN) IV  Antibiotics  :    Anti-infectives (From admission, onward)   Start     Dose/Rate Route Frequency Ordered Stop   11/13/19 1600  remdesivir 100 mg in sodium chloride 0.9 % 100 mL IVPB     100 mg 200 mL/hr over 30 Minutes Intravenous Daily 11/12/19 2146 11/17/19 1559   11/12/19 2300  remdesivir 200 mg in sodium chloride 0.9% 250 mL IVPB     200 mg 580 mL/hr over 30 Minutes Intravenous Once 11/12/19 2146 11/13/19 0104   11/12/19 2130  cefTRIAXone (ROCEPHIN) 2 g in sodium chloride 0.9 % 100 mL IVPB  Status:  Discontinued     2 g 200 mL/hr over 30 Minutes Intravenous  Once 11/12/19 2056 11/12/19 2125   11/12/19 2130  azithromycin (ZITHROMAX) 500 mg in sodium chloride 0.9 % 250 mL IVPB  Status:  Discontinued     500 mg 250 mL/hr over 60 Minutes Intravenous  Once 11/12/19 2056 11/12/19 2125       Time Spent in minutes  30   Lala Lund M.D on 11/15/2019 at 10:01 AM  To page go to [email or web address] - password [email or web address]  [facility] -  Office  [phone]  See all Orders from today for further details    Objective:   Vitals:   11/14/19 1935 11/15/19 0050 11/15/19 0331 11/15/19 0800  BP: (!) 126/58 (!) 101/58 (!) 105/48 126/62   Pulse: 71 70 72 66  Resp: (!) 24 20 20 18   Temp: 98.4 F (36.9 C) (!) 97.4 F (36.3 C) (!) 97.3 F (36.3 C) (!) 97.2 F (36.2 C)  TempSrc: Oral Axillary Oral Oral  SpO2: 94% 96% 92% 92%  Weight:      Height:        Wt Readings from Last 3 Encounters:  11/13/19 69.4 kg  10/28/19 69.4 kg  10/10/19 69.4 kg     Intake/Output Summary (Last 24 hours) at 11/15/2019 1001 Last data filed at 11/15/2019 0900 Gross per 24 hour  Intake 862.3 ml  Output 760 ml  Net 102.3 ml     Physical Exam  Awake Alert, No new F.N deficits, Normal affect Diagonal.AT,PERRAL Supple Neck,No JVD, No cervical lymphadenopathy appriciated.  Symmetrical Chest wall movement, Good air movement bilaterally, CTAB RRR,No Gallops, Rubs or new Murmurs, No Parasternal Heave +ve B.Sounds, Abd Soft, No tenderness, No organomegaly appriciated, No rebound - guarding or rigidity. No Cyanosis, Clubbing or edema, No new Rash or bruise        Data Review:    CBC Recent Labs  Lab 11/12/19 1916 11/12/19 1916 11/12/19 1939 11/13/19 0417 11/13/19 0755 11/14/19 0503 11/15/19 0622  WBC 5.2  --   --  5.1 4.4 5.0 11.0*  HGB 9.4*   < > 9.5* 8.8* 9.0* 9.4* 8.8*  HCT 29.3*   < > 28.0* 27.9* 28.7* 29.0* 27.5*  PLT 200  --   --  190 179 212 266  MCV 91.0  --   --  91.2 93.2 90.6 89.9  MCH 29.2  --   --  28.8 29.2 29.4 28.8  MCHC 32.1  --   --  31.5 31.4 32.4 32.0  RDW 15.3  --   --  15.5 15.5 15.6* 15.8*  LYMPHSABS 0.9  --   --   --   --  0.9 1.1  MONOABS 0.3  --   --   --   --  0.3 0.5  EOSABS 0.0  --   --   --   --  0.0 0.0  BASOSABS 0.0  --   --   --   --  0.0 0.0   < > = values in this interval not displayed.    Chemistries  Recent Labs  Lab 11/12/19 1916 11/12/19 1939 11/13/19 0417 11/13/19 0755 11/14/19 0503 11/15/19 0622  NA 137 137  --  142 139 142  K 4.5 4.3  --  3.8 4.1 4.1  CL 103  --   --  112* 112* 111  CO2 17*  --   --  17* 18* 20*  GLUCOSE 181*  --   --  112* 205* 167*  BUN 67*  --   --  54*  52* 53*  CREATININE 3.08*  --  2.78* 2.51* 2.32* 2.08*  CALCIUM 8.0*  --   --  7.0* 7.4* 8.2*  AST 25  --   --  34 39 34  ALT 17  --   --  16 18 21   ALKPHOS 65  --   --  58 63 63  BILITOT 0.7  --   --  0.6 0.4 0.7  MG  --   --   --  0.8* 1.9 1.7  INR 1.0  --   --   --   --   --      ------------------------------------------------------------------------------------------------------------------ No results for input(s): CHOL, HDL, LDLCALC, TRIG, CHOLHDL, LDLDIRECT in the last 72 hours.  Lab Results  Component Value Date   HGBA1C 7.0 (H) 09/20/2019   ------------------------------------------------------------------------------------------------------------------ No results for input(s): TSH, T4TOTAL, T3FREE, THYROIDAB in the last 72 hours.  Invalid input(s): FREET3  Cardiac Enzymes No results for input(s): CKMB, TROPONINI, MYOGLOBIN in the last 168 hours.  Invalid input(s): CK ------------------------------------------------------------------------------------------------------------------    Component Value Date/Time   BNP 255.2 (H) 11/15/2019 3888    Micro Results Recent Results (from the past 240 hour(s))  SARS Coronavirus 2 by RT PCR (hospital order, performed in [facility] hospital lab) Nasopharyngeal Nasopharyngeal Swab  Status: Abnormal   Collection Time: 11/12/19  7:16 PM   Specimen: Nasopharyngeal Swab  Result Value Ref Range Status   SARS Coronavirus 2 POSITIVE (A) NEGATIVE Final    Comment: RESULT CALLED TO, READ BACK BY AND VERIFIED WITH: T KELLY RN 11/12/19 2119 JDW (NOTE) SARS-CoV-2 target nucleic acids are DETECTED SARS-CoV-2 RNA is generally detectable in upper respiratory specimens  during the acute phase of infection.  Positive results are indicative  of the presence of the identified virus, but do not rule out bacterial infection or co-infection with other pathogens not detected by the test.  Clinical correlation with patient history and  other  diagnostic information is necessary to determine patient infection status.  The expected result is negative. Fact Sheet for Patients:   StrictlyIdeas.no  Fact Sheet for Healthcare Providers:   BankingDealers.co.za   This test is not yet approved or cleared by the [place] FDA and  has been authorized for detection and/or diagnosis of SARS-CoV-2 by FDA under an Emergency Use Authorization (EUA).  This EUA will remain in effect (meaning this test can be used)  for the duration of  the COVID-19 declaration under Section 564(b)(1) of the Act, 21 U.S.C. section 360-bbb-3(b)(1), unless the authorization is terminated or revoked sooner. Performed at [facility], [place] [address] [place], [place] [address]   Blood Culture (routine x 2)     Status: None (Preliminary result)   Collection Time: 11/12/19  7:16 PM   Specimen: BLOOD  Result Value Ref Range Status   Specimen Description BLOOD LEFT FOREARM  Final   Special Requests   Final    BOTTLES DRAWN AEROBIC AND ANAEROBIC Blood Culture results may not be optimal due to an excessive volume of blood received in culture bottles   Culture   Final    NO GROWTH 2 DAYS Performed at [facility], [place] [address] [place], [place] [address]    Report Status PENDING  Incomplete  Urine culture     Status: None   Collection Time: 11/12/19  7:16 PM   Specimen: In/Out Cath Urine  Result Value Ref Range Status   Specimen Description IN/OUT CATH URINE  Final   Special Requests NONE  Final   Culture   Final    NO GROWTH Performed at [facility], [place] [address] [place], [place] [address]    Report Status 11/13/2019 FINAL  Final  Blood Culture (routine x 2)     Status: None (Preliminary result)   Collection Time: 11/12/19  7:21 PM   Specimen: BLOOD  Result Value Ref Range Status   Specimen Description BLOOD RIGHT FOREARM  Final   Special Requests   Final    BOTTLES DRAWN AEROBIC  AND ANAEROBIC Blood Culture results may not be optimal due to an inadequate volume of blood received in culture bottles   Culture   Final    NO GROWTH 2 DAYS Performed at [facility], [place] [address] [place],  [address]    Report Status PENDING  Incomplete    Radiology Reports CT Head Wo Contrast  Result Date: 10/28/2019 CLINICAL DATA:  Un witnessed fall, weakness, confusion EXAM: CT HEAD WITHOUT CONTRAST TECHNIQUE: Contiguous axial images were obtained from the base of the skull through the vertex without intravenous contrast. COMPARISON:  None. FINDINGS: Brain: Hypodensities in the periventricular white matter, most pronounced in the left frontal region, most likely reflect chronic small vessel ischemic change. No signs of acute  infarct or hemorrhage. Lateral ventricles and remaining midline structures are unremarkable. No acute extra-axial fluid collections. No mass effect. Vascular: No hyperdense vessel or unexpected calcification. Skull: Normal. Negative for fracture or focal lesion. Sinuses/Orbits: Mucoperiosteal thickening within the maxillary sinuses, right greater than left. Remaining sinuses are clear. Other: None. IMPRESSION: 1. No acute intracranial trauma. 2. Likely chronic small vessel ischemic changes within the frontal periventricular white matter. Electronically Signed   By: Randa Ngo M.D.   On: 10/28/2019 19:31   DG Chest Port 1 View  Result Date: 11/12/2019 CLINICAL DATA:  Shortness of breath and sepsis. Fever. Productive cough. EXAM: PORTABLE CHEST 1 VIEW COMPARISON:  10/28/2019.  09/19/2019. FINDINGS: The patient has a history of pulmonary fibrosis and emphysema. Chronic lung markings appear similar allowing for technical differences. Upper lobe emphysema as seen previously. No sign of dense consolidation, collapse or effusion. Previous median sternotomy and CABG. Aortic atherosclerosis. No acute bone finding. IMPRESSION: Portable film with the poor inspiration.  Chronic fibrotic lung disease and upper lung emphysema. No sign of acute infiltrate, collapse or effusion. Electronically Signed   By: Nelson Chimes M.D.   On: 11/12/2019 19:37   DG Chest Portable 1 View  Result Date: 10/28/2019 CLINICAL DATA:  79 year old male with altered mental status. EXAM: PORTABLE CHEST 1 VIEW COMPARISON:  Chest radiograph dated 09/19/2019 and CT dated 09/19/2019 FINDINGS: Diffuse interstitial scarring in keeping with fibrosis. No new consolidation. There is no pleural effusion or pneumothorax. Stable cardiomediastinal silhouette. Atherosclerotic calcification of the aorta. Median sternotomy wires and CABG vascular clips. IMPRESSION: 1. No acute cardiopulmonary process. 2. Fibrotic changes. Electronically Signed   By: Anner Crete M.D.   On: 10/28/2019 21:16   ECHOCARDIOGRAM COMPLETE  Result Date: 11/14/2019    ECHOCARDIOGRAM REPORT   Patient Name:   Robert Little Date of Exam: 11/14/2019 Medical Rec #:  [id number]    Height:       68.0 in Accession #:    [id number]   Weight:       153.0 lb Date of Birth:  08-16-1940    BSA:          1.824 m Patient Age:    36 years     BP:           131/61 mmHg Patient Gender: M            HR:           58 bpm. Exam Location:  Inpatient Procedure: 2D Echo, Cardiac Doppler and Color Doppler Indications:    Elevated Troponin  History:        Patient has prior history of Echocardiogram examinations, most                 recent 09/06/2019. CHF, CAD; Risk Factors:Hypertension,                 Dyslipidemia and Former Smoker.  Sonographer:    Vickie Epley RDCS Referring Phys: [id number] RHONDA G BARRETT  Sonographer Comments: Covid positive. IMPRESSIONS  1. Left ventricular ejection fraction, by estimation, is 60 to 65%. The left ventricle has normal function. The left ventricle has no regional wall motion abnormalities. There is mild concentric left ventricular hypertrophy. Left ventricular diastolic parameters are consistent with Grade II diastolic dysfunction  (pseudonormalization). Elevated left atrial pressure.  2. Right ventricular systolic function is normal. The right ventricular size is normal. There is normal pulmonary artery systolic pressure.  3. Left atrial size was mildly dilated.  4. The  mitral valve is normal in structure. Mild mitral valve regurgitation. No evidence of mitral stenosis.  5. The aortic valve is normal in structure. Aortic valve regurgitation is not visualized. No aortic stenosis is present.  6. The inferior vena cava is normal in size with greater than 50% respiratory variability, suggesting right atrial pressure of 3 mmHg. FINDINGS  Left Ventricle: Left ventricular ejection fraction, by estimation, is 60 to 65%. The left ventricle has normal function. The left ventricle has no regional wall motion abnormalities. The left ventricular internal cavity size was normal in size. There is  mild concentric left ventricular hypertrophy. Left ventricular diastolic parameters are consistent with Grade II diastolic dysfunction (pseudonormalization). Elevated left atrial pressure. Right Ventricle: The right ventricular size is normal. No increase in right ventricular wall thickness. Right ventricular systolic function is normal. There is normal pulmonary artery systolic pressure. The tricuspid regurgitant velocity is 2.25 m/s, and  with an assumed right atrial pressure of 3 mmHg, the estimated right ventricular systolic pressure is 63.3 mmHg. Left Atrium: Left atrial size was mildly dilated. Right Atrium: Right atrial size was normal in size. Pericardium: There is no evidence of pericardial effusion. Mitral Valve: The mitral valve is normal in structure. Normal mobility of the mitral valve leaflets. Mild mitral valve regurgitation. No evidence of mitral valve stenosis. Tricuspid Valve: The tricuspid valve is normal in structure. Tricuspid valve regurgitation is not demonstrated. No evidence of tricuspid stenosis. Aortic Valve: The aortic valve is normal  in structure. Aortic valve regurgitation is not visualized. No aortic stenosis is present. Pulmonic Valve: The pulmonic valve was normal in structure. Pulmonic valve regurgitation is not visualized. No evidence of pulmonic stenosis. Aorta: The aortic root is normal in size and structure. Venous: The inferior vena cava is normal in size with greater than 50% respiratory variability, suggesting right atrial pressure of 3 mmHg. IAS/Shunts: No atrial level shunt detected by color flow Doppler.  LEFT VENTRICLE PLAX 2D LVIDd:         4.70 cm      Diastology LVIDs:         3.42 cm      LV e' lateral:   7.14 cm/s LV PW:         0.80 cm      LV E/e' lateral: 12.3 LV IVS:        0.80 cm      LV e' medial:    4.62 cm/s LVOT diam:     1.90 cm      LV E/e' medial:  19.0 LV SV:         45 LV SV Index:   25 LVOT Area:     2.84 cm  LV Volumes (MOD) LV vol d, MOD A2C: 103.0 ml LV vol d, MOD A4C: 115.0 ml LV vol s, MOD A2C: 61.9 ml LV vol s, MOD A4C: 46.6 ml LV SV MOD A2C:     41.1 ml LV SV MOD A4C:     115.0 ml LV SV MOD BP:      57.4 ml RIGHT VENTRICLE RV S prime:     10.10 cm/s TAPSE (M-mode): 1.6 cm LEFT ATRIUM             Index       RIGHT ATRIUM           Index LA diam:        4.20 cm 2.30 cm/m  RA Area:     10.30 cm LA Vol (A2C):  24.4 ml 13.38 ml/m RA Volume:   18.10 ml  9.92 ml/m LA Vol (A4C):   33.3 ml 18.26 ml/m LA Biplane Vol: 31.4 ml 17.22 ml/m  AORTIC VALVE LVOT Vmax:   70.00 cm/s LVOT Vmean:  40.000 cm/s LVOT VTI:    0.160 m  AORTA Ao Root diam: 3.10 cm MITRAL VALVE               TRICUSPID VALVE MV Area (PHT): 5.02 cm    TR Peak grad:   20.2 mmHg MV Decel Time: 151 msec    TR Vmax:        225.00 cm/s MV E velocity: 87.80 cm/s MV A velocity: 70.30 cm/s  SHUNTS MV E/A ratio:  1.25        Systemic VTI:  0.16 m                            Systemic Diam: 1.90 cm Ena Dawley MD Electronically signed by Ena Dawley MD Signature Date/Time: 11/14/2019/1:13:59 PM    Final

## 2019-11-15 NOTE — Plan of Care (Signed)

## 2019-11-15 NOTE — Progress Notes (Signed)
Occupational Therapy Evaluation Patient Details Name: Robert Little MRN: [id number] DOB: 1941-04-27 Today's Date: 11/15/2019    History of Present Illness 79 y.o. male with medical history significant for chronic anemia, pulmonary fibrosis, hypertension, coronary artery disease, chronic diastolic CHF, and schizoaffective disorder, presenting to the emergency department with progressive shortness of breath long with severe orthopnea starting 1 to 2 days prior to hospital visit, in the hospital work-up suggestive of CHF and acute hypoxic respiratory failure and he was admitted 09/16/19   Clinical Impression   Patient is pleasant and talkative, though confused.  He lives in a group home where he gets assistance from staff with ADLs and walks with a RW.  Today patient able to stand with min assist and RW and take a few steps before fatiguing and needing to rest.  Patient on 3L O2 [name] with SpO2 92 at rest and desat to 82 with standing.  Educated on pursed lip breathing techniques.  He is able to complete UB ADLs seated with set up and LB ADLs with mod assist due to poor balance.  Will continue to follow with OT acutely to address activity tolerance and increase independence to baseline.    Follow Up Recommendations  Supervision/Assistance - 24 hour;Home health OT    Equipment Recommendations  None recommended by OT(defer to next venue)    Recommendations for Other Services       Precautions / Restrictions Precautions Precautions: Fall;Other (comment) Precaution Comments: monitor O2      Mobility Bed Mobility Overal bed mobility: Needs Assistance Bed Mobility: Supine to Sit     Supine to sit: Min assist;HOB elevated        Transfers Overall transfer level: Needs assistance Equipment used: Rolling walker (2 wheeled) Transfers: Sit to/from Stand Sit to Stand: Min assist              Balance Overall balance assessment: Needs assistance Sitting-balance support: Feet supported;No  upper extremity supported Sitting balance-Leahy Scale: Good Sitting balance - Comments: Lean over to don/doff socks   Standing balance support: Bilateral upper extremity supported Standing balance-Leahy Scale: Poor Standing balance comment: posterior preference in initial stand, needs support from RW and therapist.                            ADL either performed or assessed with clinical judgement   ADL Overall ADL's : Needs assistance/impaired Eating/Feeding: Set up;Sitting   Grooming: Set up;Sitting   Upper Body Bathing: Set up;Sitting   Lower Body Bathing: Sit to/from stand;Moderate assistance   Upper Body Dressing : Set up;Sitting   Lower Body Dressing: Sitting/lateral leans;Moderate assistance   Toilet Transfer: RW;+2 for safety/equipment;Moderate assistance           Functional mobility during ADLs: Moderate assistance;+2 for safety/equipment       Vision         Perception     Praxis      Pertinent Vitals/Pain Pain Assessment: No/denies pain     Hand Dominance Right   Extremity/Trunk Assessment Upper Extremity Assessment Upper Extremity Assessment: Generalized weakness       Cervical / Trunk Assessment Cervical / Trunk Assessment: Kyphotic   Communication Communication Communication: HOH   Cognition Arousal/Alertness: Awake/alert Behavior During Therapy: WFL for tasks assessed/performed Overall Cognitive Status: History of cognitive impairments - at baseline  General Comments: Poor historian. He is likely close to his baseline. Talks nonsensical often, tangential.   General Comments  Fatigues quickly, desats.  BP low, 98/53 when seated, 102/51 after stand.    Exercises Exercises: Other exercises Other Exercises Other Exercises: Pursed lip breathing Other Exercises: x5 incentive spirometer - pulled 1000   Shoulder Instructions      Home Living Family/patient expects to be  discharged to:: Group home                                 Additional Comments: Poor historian      Prior Functioning/Environment Level of Independence: Needs assistance  Gait / Transfers Assistance Needed: per pt report he uses a RW and 2 L O2 [name] at baseline.  He gets assistance for ADLs and gait.  ADL's / Homemaking Assistance Needed: Assist from caregivers            OT Problem List: Decreased strength;Decreased activity tolerance;Impaired balance (sitting and/or standing);Decreased cognition;Decreased safety awareness;Cardiopulmonary status limiting activity      OT Treatment/Interventions: Self-care/ADL training;Therapeutic exercise;Energy conservation;Therapeutic activities;Balance training;Cognitive remediation/compensation    OT Goals(Current goals can be found in the care plan section) Acute Rehab OT Goals Patient Stated Goal: to get better and return home OT Goal Formulation: With patient Time For Goal Achievement: 11/29/19 Potential to Achieve Goals: Good  OT Frequency: Min 2X/week   Barriers to D/C:            Co-evaluation              AM-PAC OT "6 Clicks" Daily Activity     Outcome Measure Help from another person eating meals?: A Little Help from another person taking care of personal grooming?: A Little Help from another person toileting, which includes using toliet, bedpan, or urinal?: A Lot Help from another person bathing (including washing, rinsing, drying)?: A Lot Help from another person to put on and taking off regular upper body clothing?: A Little Help from another person to put on and taking off regular lower body clothing?: A Lot 6 Click Score: 15   End of Session Equipment Utilized During Treatment: Rolling walker;Gait belt Nurse Communication: Mobility status  Activity Tolerance: Patient tolerated treatment well Patient left: in chair;with call bell/phone within reach;with chair alarm set  OT Visit Diagnosis:  Unsteadiness on feet (R26.81);Muscle weakness (generalized) (M62.81);Other symptoms and signs involving cognitive function                Time: 0220-0245 OT Time Calculation (min): 25 min Charges:  OT General Charges $OT Visit: 1 Visit OT Evaluation $OT Eval Moderate Complexity: 1 Mod OT Treatments $Therapeutic Activity: 8-22 mins  Robert Little, OTR/L   Robert Little 11/15/2019, 2:57 PM

## 2019-11-16 LAB — COMPREHENSIVE METABOLIC PANEL
ALT: 25 U/L (ref 0–44)
AST: 32 U/L (ref 15–41)
Albumin: 3.2 g/dL — ABNORMAL LOW (ref 3.5–5.0)
Alkaline Phosphatase: 63 U/L (ref 38–126)
Anion gap: 11 (ref 5–15)
BUN: 58 mg/dL — ABNORMAL HIGH (ref 8–23)
CO2: 23 mmol/L (ref 22–32)
Calcium: 8.9 mg/dL (ref 8.9–10.3)
Chloride: 108 mmol/L (ref 98–111)
Creatinine, Ser: 2.14 mg/dL — ABNORMAL HIGH (ref 0.61–1.24)
GFR calc Af Amer: 33 mL/min — ABNORMAL LOW (ref >60)
GFR calc non Af Amer: 29 mL/min — ABNORMAL LOW (ref >60)
Glucose, Bld: 179 mg/dL — ABNORMAL HIGH (ref 70–99)
Potassium: 4.5 mmol/L (ref 3.5–5.1)
Sodium: 142 mmol/L (ref 135–145)
Total Bilirubin: 0.9 mg/dL (ref 0.3–1.2)
Total Protein: 6.2 g/dL — ABNORMAL LOW (ref 6.5–8.1)

## 2019-11-16 LAB — CBC WITH DIFFERENTIAL/PLATELET
Abs Immature Granulocytes: 0.14 10*3/uL — ABNORMAL HIGH (ref 0.00–0.07)
Basophils Absolute: 0 10*3/uL (ref 0.0–0.1)
Basophils Relative: 0 %
Eosinophils Absolute: 0 10*3/uL (ref 0.0–0.5)
Eosinophils Relative: 0 %
HCT: 26.7 % — ABNORMAL LOW (ref 39.0–52.0)
Hemoglobin: 8.7 g/dL — ABNORMAL LOW (ref 13.0–17.0)
Immature Granulocytes: 1 %
Lymphocytes Relative: 9 %
Lymphs Abs: 1 10*3/uL (ref 0.7–4.0)
MCH: 29 pg (ref 26.0–34.0)
MCHC: 32.6 g/dL (ref 30.0–36.0)
MCV: 89 fL (ref 80.0–100.0)
Monocytes Absolute: 0.6 10*3/uL (ref 0.1–1.0)
Monocytes Relative: 5 %
Neutro Abs: 9 10*3/uL — ABNORMAL HIGH (ref 1.7–7.7)
Neutrophils Relative %: 85 %
Platelets: 258 10*3/uL (ref 150–400)
RBC: 3 MIL/uL — ABNORMAL LOW (ref 4.22–5.81)
RDW: 15.6 % — ABNORMAL HIGH (ref 11.5–15.5)
WBC: 10.7 10*3/uL — ABNORMAL HIGH (ref 4.0–10.5)
nRBC: 0 % (ref 0.0–0.2)

## 2019-11-16 LAB — BRAIN NATRIURETIC PEPTIDE: B Natriuretic Peptide: 234.9 pg/mL — ABNORMAL HIGH (ref 0.0–100.0)

## 2019-11-16 LAB — GLUCOSE, CAPILLARY
Glucose-Capillary: 168 mg/dL — ABNORMAL HIGH (ref 70–99)
Glucose-Capillary: 194 mg/dL — ABNORMAL HIGH (ref 70–99)
Glucose-Capillary: 253 mg/dL — ABNORMAL HIGH (ref 70–99)
Glucose-Capillary: 181 mg/dL — ABNORMAL HIGH (ref 70–99)

## 2019-11-16 LAB — C-REACTIVE PROTEIN: CRP: 4.4 mg/dL — ABNORMAL HIGH (ref <1.0)

## 2019-11-16 LAB — D-DIMER, QUANTITATIVE: D-Dimer, Quant: 0.87 ug/mL-FEU — ABNORMAL HIGH (ref 0.00–0.50)

## 2019-11-16 LAB — MAGNESIUM: Magnesium: 1.7 mg/dL (ref 1.7–2.4)

## 2019-11-16 MED ORDER — CARVEDILOL 3.125 MG PO TABS: 3.1250 mg | ORAL_TABLET | Freq: Two times a day (BID) | ORAL | 0 refills | Status: DC

## 2019-11-16 MED ORDER — PANTOPRAZOLE SODIUM 40 MG PO TBEC: 40.0000 mg | DELAYED_RELEASE_TABLET | Freq: Every day | ORAL | 0 refills | Status: AC

## 2019-11-16 MED ORDER — CLONAZEPAM 0.5 MG PO TABS: 0.5000 mg | ORAL_TABLET | Freq: Two times a day (BID) | ORAL | 0 refills | Status: DC

## 2019-11-16 MED ORDER — ALPRAZOLAM 0.25 MG PO TABS
0.2500 mg | ORAL_TABLET | Freq: Once | ORAL | Status: AC
  Administered 2019-11-16: 0.25 mg via ORAL
  Filled 2019-11-16: qty 1

## 2019-11-16 MED ORDER — AMLODIPINE BESYLATE 10 MG PO TABS: 10.0000 mg | ORAL_TABLET | Freq: Every day | ORAL | 0 refills | Status: DC

## 2019-11-16 NOTE — Progress Notes (Signed)
RE: Robert Little DOB: 2041/06/20  Please be advised that the above-named patient will require a short-term nursing home stay - anticipated 30 days or less for rehabilitation and strengthening. The plan is for return home.

## 2019-11-16 NOTE — TOC Progression Note (Addendum)
Transition of Care [facility]) - Progression Note    Patient Details  Name: Robert Little MRN: [id number] Date of Birth: 1941-02-04  [facility]) CM/SW Contact  Maryclare Labrador, RN Phone Number: 11/16/2019, 9:44 AM  Clinical Narrative:   Pt deemed stable for discharge back to group home today. CM contacted pts guardian [name]. CM read aloud therapy assessments including recommendations - guardian confirmed that assessment is pt's baseline with mobility, facility can continue to provide 24 hour supervision.  CM briefly explained discharge recommendations for covid positive pt's, bedside nurse to provide more in detail teaching during discharge instruction reivew.  Guardian to arrange transport from [facility] back to group home once cleared by bedside nurse  Update: CM received call back from [facility] - facility asking about other placement options for pt as facility states they are unable to provide isolation/quarantine with pt being covid positive.  CSW to follow up with guardian.         Expected Discharge Plan and Services           Expected Discharge Date: 11/16/19                                     Social Determinants of Health (SDOH) Interventions    Readmission Risk Interventions Readmission Risk Prevention Plan 09/20/2019  Transportation Screening Complete  PCP or Specialist Appt within 3-5 Days Complete  HRI or [place] Complete  Social Work Consult for [place] Planning/Counseling Complete  Palliative Care Screening Not Applicable  Medication Review Press photographer) Complete

## 2019-11-16 NOTE — NC FL2 (Signed)
[place] LEVEL OF CARE SCREENING TOOL     IDENTIFICATION  Patient Name: Robert Little Birthdate: January 29, 1941 Sex: male Admission Date (Current Location): 11/12/2019  [facility] and [place] Number:  Herbalist and Address:  [facility]. [facility], [place] [address], [place], [place] [address]      Provider Number: [id number]  Attending Physician Name and Address:  Thurnell Lose, MD  Relative Name and Phone Number:  Shella Spearing, [phone]    Current Level of Care: Hospital Recommended Level of Care: [place] Prior Approval Number:    Date Approved/Denied:   PASRR Number: pending  Discharge Plan: SNF    Current Diagnoses: Patient Active Problem List   Diagnosis Date Noted  . Sepsis due to COVID-19 ([place]) 11/12/2019  . Hypoglycemia 10/28/2019  . Hyponatremia 10/28/2019  . Acute respiratory failure with hypoxia ([place]) 09/17/2019  . Positive D dimer 09/17/2019  . Acute on chronic diastolic CHF (congestive heart failure) ([place]) 09/17/2019  . Chronic diastolic CHF (congestive heart failure), NYHA class 1 ([place]) 09/17/2019  . Acute hypoxemic respiratory failure ([place]) 09/17/2019  . Syncope 09/05/2019  . Hypocalcemia 09/05/2019  . Hypomagnesemia 09/05/2019  . CKD (chronic kidney disease), stage IV ([place]) 09/05/2019  . Acute urinary retention 05/18/2018  . UTI (urinary tract infection) 05/15/2018  . Sepsis ([place]) 05/15/2018  . Hypokalemia 05/15/2018  . AKI (acute kidney injury) ([place]) 05/15/2018  . HLD (hyperlipidemia) 05/15/2018  . Coronary artery disease involving native coronary artery of native heart without angina pectoris   . Essential hypertension   . Pulmonary fibrosis ([place])   . Schizo affective schizophrenia ([place])   . Normocytic anemia     Orientation RESPIRATION BLADDER Height & Weight     Self, Place, Time  O2(Nasal cannula 2L) Continent Weight: 153 lb (69.4 kg) Height:  5\' 8"  (172.7 cm)  BEHAVIORAL  SYMPTOMS/MOOD NEUROLOGICAL BOWEL NUTRITION STATUS      Continent Diet(Please see DC Summary)  AMBULATORY STATUS COMMUNICATION OF NEEDS Skin   Limited Assist Verbally Normal                       Personal Care Assistance Level of Assistance  Bathing, Dressing, Feeding Bathing Assistance: Maximum assistance Feeding assistance: Independent Dressing Assistance: Maximum assistance     Functional Limitations Info  Sight, Hearing, Speech Sight Info: Adequate Hearing Info: Adequate Speech Info: Adequate    SPECIAL CARE FACTORS FREQUENCY  PT (By licensed PT), OT (By licensed OT)     PT Frequency: 4x/week OT Frequency: 3x/week            Contractures Contractures Info: Not present    Additional Factors Info  Code Status, Allergies, Isolation Precautions, Psychotropic Code Status Info: Full Allergies Info: NKA Psychotropic Info: Klonopin   Isolation Precautions Info: COVID+     Current Medications (11/16/2019):  This is the current hospital active medication list Current Facility-Administered Medications  Medication Dose Route Frequency Provider Last Rate Last Admin  . albuterol (VENTOLIN HFA) 108 (90 Base) MCG/ACT inhaler 2 puff  2 puff Inhalation Q2200 Thurnell Lose, MD   2 puff at 11/15/19 1955  . amLODipine (NORVASC) tablet 10 mg  10 mg Oral Daily Thurnell Lose, MD   10 mg at 11/16/19 0912  . aspirin EC tablet 81 mg  81 mg Oral Daily Rise Patience, MD   81 mg at 11/16/19 0914  . atorvastatin (LIPITOR) tablet 20 mg  20 mg Oral  QHS Rise Patience, MD   20 mg at 11/15/19 2029  . benztropine (COGENTIN) tablet 0.5 mg  0.5 mg Oral BID Rise Patience, MD   0.5 mg at 11/16/19 0914  . carvedilol (COREG) tablet 3.125 mg  3.125 mg Oral BID WC Thurnell Lose, MD   3.125 mg at 11/15/19 1708  . clonazePAM (KLONOPIN) tablet 0.5 mg  0.5 mg Oral BID Rise Patience, MD   0.5 mg at 11/16/19 0913  . dexamethasone (DECADRON) injection 6 mg  6 mg  Intravenous Q24H Thurnell Lose, MD   6 mg at 11/16/19 0911  . ferrous sulfate tablet 325 mg  325 mg Oral BID WC Rise Patience, MD   325 mg at 11/16/19 0914  . fluticasone furoate-vilanterol (BREO ELLIPTA) 200-25 MCG/INH 1 puff  1 puff Inhalation Daily Rise Patience, MD   1 puff at 11/16/19 0916  . furosemide (LASIX) tablet 40 mg  40 mg Oral Daily Thurnell Lose, MD   40 mg at 11/16/19 0913  . heparin injection 5,000 Units  5,000 Units Subcutaneous Q8H Thurnell Lose, MD   5,000 Units at 11/16/19 0606  . insulin aspart (novoLOG) injection 0-6 Units  0-6 Units Subcutaneous TID WC Rise Patience, MD   1 Units at 11/16/19 0911  . insulin glargine (LANTUS) injection 14 Units  14 Units Subcutaneous Daily Thurnell Lose, MD   14 Units at 11/16/19 [phone]  . ondansetron (ZOFRAN) injection 4 mg  4 mg Intravenous Q6H PRN Rise Patience, MD      . pantoprazole (PROTONIX) EC tablet 40 mg  40 mg Oral Daily Rise Patience, MD   40 mg at 11/16/19 0914  . potassium chloride SA (KLOR-CON) CR tablet 20 mEq  20 mEq Oral Daily Thurnell Lose, MD   20 mEq at 11/16/19 0913  . remdesivir 100 mg in sodium chloride 0.9 % 100 mL IVPB  100 mg Intravenous Daily Wyvonnia Dusky, MD   Stopped at 11/15/19 1642  . tamsulosin (FLOMAX) capsule 0.4 mg  0.4 mg Oral Daily Rise Patience, MD   0.4 mg at 11/16/19 0912  . umeclidinium bromide (INCRUSE ELLIPTA) 62.5 MCG/INH 1 puff  1 puff Inhalation Daily Rise Patience, MD   1 puff at 11/16/19 0916  . vitamin B-12 (CYANOCOBALAMIN) tablet 500 mcg  500 mcg Oral BID Rise Patience, MD   500 mcg at 11/16/19 6004     Discharge Medications: Please see discharge summary for a list of discharge medications.  Relevant Imaging Results:  Relevant Lab Results:   Additional Information SSN: [id number]  [place], LCSW

## 2019-11-16 NOTE — Progress Notes (Signed)
Physical Therapy Treatment Patient Details Name: Robert Little MRN: [id number] DOB: [birth year] Today's Date: 11/16/2019    History of Present Illness 79 y.o. male with medical history significant for chronic anemia, pulmonary fibrosis, hypertension, coronary artery disease, chronic diastolic CHF, and schizoaffective disorder, presenting to the emergency department with progressive shortness of breath long with severe orthopnea starting 1 to 2 days prior to hospital visit, in the hospital work-up suggestive of CHF and acute hypoxic respiratory failure and he was admitted 09/16/19    PT Comments    Pt would benefit from post acute rehab until he is able to return to his group home.  He did better with more stable standing and was able to transfer OOB to chair with one person min assist and RW today.  He will be ready for gait progression with O2 tank and chair to follow next session.  When the wave was good on the monitor pt was 90% or higher O2 on 3 L O2 [place] during mobility via earlobe probe.  PT will continue to follow acutely for safe mobility progression.   Follow Up Recommendations  SNF     Equipment Recommendations  None recommended by PT    Recommendations for Other Services   NA     Precautions / Restrictions Precautions Precautions: Fall;Other (comment) Precaution Comments: monitor O2    Mobility  Bed Mobility Overal bed mobility: Needs Assistance Bed Mobility: Supine to Sit     Supine to sit: Min assist;HOB elevated     General bed mobility comments: Min assist to support trunk to come to sitting EOB.  HOB ~35 degrees.   Transfers Overall transfer level: Needs assistance Equipment used: Rolling walker (2 wheeled) Transfers: Sit to/from Stand Sit to Stand: Min assist         General transfer comment: Min assist to stand up to RW. Pivotal steps with min assist around to recliner chair.  Less posterior preference today.   Ambulation/Gait             General  Gait Details: ready for gait progression          Balance Overall balance assessment: Needs assistance Sitting-balance support: Feet supported;Bilateral upper extremity supported Sitting balance-Leahy Scale: Good     Standing balance support: Bilateral upper extremity supported Standing balance-Leahy Scale: Poor Standing balance comment: needs external support from RW and therapist.                             Cognition Arousal/Alertness: Awake/alert Behavior During Therapy: WFL for tasks assessed/performed Overall Cognitive Status: History of cognitive impairments - at baseline                                           General Comments General comments (skin integrity, edema, etc.): When O2 waveform was good, pt was 90% or higher on 3 L O2 [place] via earlobe pediatric finger probe.       Pertinent Vitals/Pain Pain Assessment: No/denies pain           PT Goals (current goals can now be found in the care plan section) Acute Rehab PT Goals Patient Stated Goal: to get better and return home Progress towards PT goals: Progressing toward goals    Frequency    Min 2X/week      PT Plan Discharge plan needs to  be updated;Frequency needs to be updated       AM-PAC PT "6 Clicks" Mobility   Outcome Measure  Help needed turning from your back to your side while in a flat bed without using bedrails?: A Little Help needed moving from lying on your back to sitting on the side of a flat bed without using bedrails?: A Little Help needed moving to and from a bed to a chair (including a wheelchair)?: A Little Help needed standing up from a chair using your arms (e.g., wheelchair or bedside chair)?: A Little Help needed to walk in hospital room?: A Lot Help needed climbing 3-5 steps with a railing? : Total 6 Click Score: 15    End of Session Equipment Utilized During Treatment: Oxygen Activity Tolerance: Patient tolerated treatment well Patient  left: in chair;with call bell/phone within reach;with chair alarm set   PT Visit Diagnosis: Muscle weakness (generalized) (M62.81);Difficulty in walking, not elsewhere classified (R26.2)     Time: 2233-6122 PT Time Calculation (min) (ACUTE ONLY): 14 min  Charges:  $Therapeutic Activity: 8-22 mins            Verdene Lennert, PT, DPT  Acute Rehabilitation [phone] pager #[phone] office               11/16/2019, 4:10 PM

## 2019-11-16 NOTE — TOC Initial Note (Signed)
Transition of Care [facility]) - Initial/Assessment Note    Patient Details  Name: Robert Little MRN: [id number] Date of Birth: [birth year]  [facility]) CM/SW Contact:    Benard Halsted, [place] Phone Number: 11/16/2019, 4:00 PM  Clinical Narrative:                 CSW spoke with patient's Guardian at [facility] ([place] for [place]). He reports the other facility residents have tested negative and they are not able to isolate the patient. CSW currently waiting on pasrr, which has gone to in-person state review. Legrand Como reports that he does not have a preference of [place] or [place].     Expected Discharge Plan: Skilled Nursing Facility Barriers to Discharge: [place] [facility])   Patient Goals and CMS Choice Patient states their goals for this hospitalization and ongoing recovery are:: Rehab and isolation CMS Medicare.gov Compare Post Acute Care list provided to:: Patient Represenative (must comment)(Guardian, Legrand Como) Choice offered to / list presented to : [place] / Guardian  Expected Discharge Plan and Services Expected Discharge Plan: [place] In-house Referral: Clinical Social Work   Post Acute Care Choice: [place] Living arrangements for the past 2 months: Group Home Expected Discharge Date: 11/16/19                                    Prior Living Arrangements/Services Living arrangements for the past 2 months: Group Home Lives with:: Facility Resident Patient language and need for interpreter reviewed:: Yes Do you feel safe going back to the place where you live?: Yes      Need for Family Participation in Patient Care: Yes (Comment) Care giver support system in place?: Yes (comment)   Criminal Activity/Legal Involvement Pertinent to Current Situation/Hospitalization: No - Comment as needed  Activities of Daily Living      Permission Sought/Granted Permission sought to share information with :  Facility Sport and exercise psychologist, Family Supports Permission granted to share information with : Yes, Verbal Permission Granted  Share Information with NAME: Legrand Como  Permission granted to share info w AGENCY: SNFs  Permission granted to share info w Relationship: Guardian  Permission granted to share info w Contact Information: [phone]  Emotional Assessment Appearance:: Appears stated age Attitude/Demeanor/Rapport: Unable to Assess Affect (typically observed): Accepting, Appropriate Orientation: : Oriented to Self, Oriented to  Time, Oriented to Place Alcohol / Substance Use: Not Applicable Psych Involvement: No (comment)  Admission diagnosis:  Hypoxia [R09.02] AKI (acute kidney injury) ([place]) [N17.9] Sepsis ([place]) [A41.9] Sepsis due to COVID-19 ([place]) [U07.1, A41.89] COVID-19 [U07.1] Patient Active Problem List   Diagnosis Date Noted  . Sepsis due to COVID-19 ([place]) 11/12/2019  . Hypoglycemia 10/28/2019  . Hyponatremia 10/28/2019  . Acute respiratory failure with hypoxia ([place]) 09/17/2019  . Positive D dimer 09/17/2019  . Acute on chronic diastolic CHF (congestive heart failure) ([place]) 09/17/2019  . Chronic diastolic CHF (congestive heart failure), NYHA class 1 ([place]) 09/17/2019  . Acute hypoxemic respiratory failure ([place]) 09/17/2019  . Syncope 09/05/2019  . Hypocalcemia 09/05/2019  . Hypomagnesemia 09/05/2019  . CKD (chronic kidney disease), stage IV ([place]) 09/05/2019  . Acute urinary retention 05/18/2018  . UTI (urinary tract infection) 05/15/2018  . Sepsis ([place]) 05/15/2018  . Hypokalemia 05/15/2018  . AKI (acute kidney injury) ([place]) 05/15/2018  . HLD (hyperlipidemia) 05/15/2018  . Coronary artery disease involving native coronary artery of native heart  without angina pectoris   . Essential hypertension   . Pulmonary fibrosis ([place])   . Schizo affective schizophrenia ([place])   . Normocytic anemia    PCP:  Care, [name] [facility] Access Pharmacy:   [id number] [address] [place], [place] [address] Phone: [phone] Fax: [phone]  [facility] [place], [place] [address] [place] [address] Phone: [phone] Fax: [phone]     Social Determinants of Health (SDOH) Interventions    Readmission Risk Interventions Readmission Risk Prevention Plan 09/20/2019  Transportation Screening Complete  PCP or Specialist Appt within 3-5 Days Complete  HRI or [place] Complete  Social Work Consult for [place] Planning/Counseling Complete  Palliative Care Screening Not Applicable  Medication Review Press photographer) Complete

## 2019-11-16 NOTE — Discharge Summary (Addendum)
Robert Little XLK:[id number] DOB: 1941/06/11 DOA: 11/12/2019  PCP: [name]  Admit date: 11/12/2019  Discharge date: 11/30/2019  Admitted From: [facility]  Disposition:  SNF   Recommendations for Outpatient Follow-up:   Follow up with PCP in 1-2 weeks  PCP Please obtain BMP/CBC, 2 view CXR in 1week,  (see Discharge instructions)   PCP Please follow up on the following pending results:    Home Health: PT, RN   Equipment/Devices: None on 2lit o2  Consultations: Cards Discharge Condition: Stable    CODE STATUS: Full    Diet Recommendation: Heart Healthy Low Carb    Chief Complaint  Patient presents with  . Shortness of Breath     Brief history of present illness from the day of admission and additional interim summary    Robert Little a 79 y.o.malewithhistory of his affective disorder, CAD, pulmonary fibrosis on home oxygen usually 2 L anemia hypertension diabetes mellitus recently admitted for hypoglycemia with use of sulfonylureas in the setting of chronic kidney disease was brought to the ER after patient has been having productive cough over the last 3 days feeling weak and tired not eating well.  ER he was diagnosed with acute hypoxic respiratory failure due to COVID-19 pneumonia, dehydration AKI and NSTEMI.  Was complicated by runs of A. fib with RVR, dehydration, hypotension and AKI.  As well anemia requiring PRBC transfusion.                                                                 Hospital Course   1. Sepsis with Acute Hypoxic Resp. Failure due to Acute Covid 19 Viral Pneumonitis during the ongoing 2020 Covid 19 Pandemic - he had moderate disease and was treated with IV steroids and remdesivir he has completed his course, back to his baseline and currently symptom-free.   Uses 2 L nasal cannula oxygen at baseline which he is doing.  Stable for discharge.   SpO2: 97 % O2 Flow Rate (L/min): 2 L/min  Recent Labs  Lab 11/24/19 0717 11/25/19 0332 11/26/19 0230 11/27/19 0350  CRP 6.4* 10.2* 15.4* 11.9*  DDIMER 1.57* 1.05* 1.60* 1.97*  BNP  --   --  259.2*  --     Hepatic Function Latest Ref Rng & Units 11/27/2019 11/26/2019 11/25/2019  Total Protein 6.5 - 8.1 g/dL 5.3(L) 5.3(L) 5.8(L)  Albumin 3.5 - 5.0 g/dL 1.8(L) 2.0(L) 2.1(L)  AST 15 - 41 U/L 20 22 25   ALT 0 - 44 U/L 26 25 29   Alk Phosphatase 38 - 126 U/L 67 66 73  Total Bilirubin 0.3 - 1.2 mg/dL 1.0 1.1 1.3(H)     2.  Dehydration with AKI on underlying CKD 4 with baseline creatinine of around 2.5. Now close to baseline after hydration with IV fluids.  3.  History of CAD. Not NSTEMI.  Could be due to demand ischemia from hypoxia, continue aspirin, statin and beta-blocker for secondary prevention, stable echocardiogram, stable EKG and chest pain-free seen by cardiology continue present care with outpatient cardiology follow-up post discharge.  4. History of schizoaffective disorder takes monthly injection of Invega  n.  Must follow-up with his psychiatrist within a week of discharge to clarify his home medications.  5. History of pulmonary fibrosis now on 2 L home oxygen.  Continue supportive care with a liters as needed.  Outpatient pulmonary follow-up.  6.  Anemia of chronic disease.  Stable will monitor.  7.  Chronic diastolic CHF.  EF on recent echocardiogram was 60%.  Appears compensated , use Lasix 17m daily PRN only.  8.  Chronically elevated D-dimers.  Previously has had negative lower extremity venous duplex, no exam signs of DVT here.    9.  Essential hypertension. BP low now on low dose Coreg only.    10. Dysphagia.  Speech therapy following currently on dysphagia 2 diet with nectar thick liquids, feeding assistance and aspiration precautions.  11. DM type II.  Was controlled  with diet control which he was doing prior to hospitalization, on no medications.  Lab Results  Component Value Date   HGBA1C 7.0 (H) 09/20/2019     Discharge diagnosis     Principal Problem:   Sepsis due to COVID-19 ([facility] Active Problems:   Coronary artery disease involving native coronary artery of native heart without angina pectoris   Essential hypertension   Pulmonary fibrosis (HCC)   Sepsis (HCC)   AKI (acute kidney injury) ([place]   CKD (chronic kidney disease), stage IV (HCC)   Acute respiratory failure with hypoxia (HCC)   Chronic diastolic CHF (congestive heart failure), NYHA class 1 ([facility]    Discharge instructions    Discharge Instructions    MyChart COVID-19 home monitoring program   Complete by: Nov 16, 2019    Is the patient willing to use the [place]?: Yes   Temperature monitoring   Complete by: Nov 16, 2019    After how many days would you like to receive a notification of this patient's flowsheet entries?: 1   Discharge instructions   Complete by: As directed    Follow with Primary MD [facility] in 7 days, follow with your psychiatrist in 1 week.   Get CBC, CMP, 2 view Chest X ray -  checked next visit within 1 week by Primary MD    Activity: As tolerated with Full fall precautions use walker/cane & assistance as needed  Disposition SNF  Diet: Heart Healthy Low Carb - soft diet with feeding assistance and aspiration precautions  Special Instructions: If you have smoked or chewed Tobacco  in the last 2 yrs please stop smoking, stop any regular Alcohol  and or any Recreational drug use.  On your next visit with your primary care physician please Get Medicines reviewed and adjusted.  Please request your Prim.MD to go over all Hospital Tests and Procedure/Radiological results at the follow up, please get all Hospital records sent to your Prim MD by signing hospital release before you go home.  If you  experience worsening of your admission symptoms, develop shortness of breath, life threatening emergency, suicidal or homicidal thoughts you must seek medical attention immediately by calling 911 or calling your MD immediately  if symptoms less severe.  You Must read complete instructions/literature along with  all the possible adverse reactions/side effects for all the Medicines you take and that have been prescribed to you. Take any new Medicines after you have completely understood and accpet all the possible adverse reactions/side effects.   Increase activity slowly   Complete by: As directed    Increase activity slowly   Complete by: As directed       Discharge Medications   Allergies as of 11/30/2019   No Known Allergies     Medication List    STOP taking these medications   atenolol 25 MG tablet Commonly known as: TENORMIN   benztropine 0.5 MG tablet Commonly known as: COGENTIN   calcium gluconate 500 MG tablet   Dexilant 30 MG capsule Generic drug: Dexlansoprazole Replaced by: pantoprazole 40 MG tablet   furosemide 40 MG tablet Commonly known as: LASIX   potassium chloride SA 20 MEQ tablet Commonly known as: KLOR-CON   senna-docusate 8.6-50 MG tablet Commonly known as: Senokot-S     TAKE these medications   acetaminophen 325 MG tablet Commonly known as: TYLENOL Take 2 tablets (650 mg total) by mouth every 6 (six) hours as needed for mild pain (or Fever >/= 101). What changed:   how much to take  when to take this   albuterol 108 (90 Base) MCG/ACT inhaler Commonly known as: VENTOLIN HFA Inhale 2 puffs into the lungs every 6 (six) hours as needed for wheezing or shortness of breath. What changed: when to take this   amiodarone 200 MG tablet Commonly known as: PACERONE Take 1 tablet (200 mg total) by mouth daily. Start taking on: Dec 01, 2019   aspirin 81 MG EC tablet Take 1 tablet (81 mg total) by mouth daily.   atorvastatin 20 MG tablet Commonly  known as: LIPITOR Take 20 mg by mouth at bedtime.   Calcium 600/Vitamin D 600-400 MG-UNIT chew tablet Generic drug: Calcium Carbonate-Vitamin D Chew 1 tablet by mouth 2 (two) times daily.   carvedilol 3.125 MG tablet Commonly known as: COREG Take 1 tablet (3.125 mg total) by mouth 2 (two) times daily with a meal.   clonazePAM 0.5 MG tablet Commonly known as: KLONOPIN Take 1 tablet (0.5 mg total) by mouth 2 (two) times daily.   feeding supplement (ENSURE ENLIVE) Liqd Take 237 mLs by mouth 2 (two) times daily between meals.   ferrous sulfate 325 (65 FE) MG tablet Take 1 tablet (325 mg total) by mouth 2 (two) times daily with a meal.   fluticasone furoate-vilanterol 200-25 MCG/INH Aepb Commonly known as: BREO ELLIPTA Inhale 1 puff into the lungs daily.   Invega Sustenna 156 MG/ML Susy injection Generic drug: paliperidone Inject 156 mg into the muscle every 30 (thirty) days.   ipratropium 0.03 % nasal spray Commonly known as: ATROVENT Place 1 spray into the nose daily as needed for congestion.   multivitamin with minerals Tabs tablet Take 1 tablet by mouth daily.   pantoprazole 40 MG tablet Commonly known as: PROTONIX Take 1 tablet (40 mg total) by mouth daily. Replaces: Dexilant 30 MG capsule   polyethylene glycol powder 17 GM/SCOOP powder Commonly known as: MiraLax Take 17 g by mouth 2 (two) times daily as needed for moderate constipation.   QUEtiapine 25 MG tablet Commonly known as: SEROQUEL Take 1 tablet (25 mg total) by mouth at bedtime.   tamsulosin 0.4 MG Caps capsule Commonly known as: FLOMAX Take 1 capsule (0.4 mg total) by mouth daily after supper. What changed: when to take this   umeclidinium  bromide 62.5 MCG/INH Aepb Commonly known as: INCRUSE ELLIPTA Inhale 1 puff into the lungs daily.   vitamin B-12 500 MCG tablet Commonly known as: CYANOCOBALAMIN Take 500 mcg by mouth 2 (two) times daily.        Contact information for follow-up providers      [facility] Schedule an appointment as soon as possible for a visit in 1 week(s).   Specialty: Family Medicine Contact information: [address] [place] [address] [phone]        Croitoru, Dani Gobble, MD Follow up in 1 week(s).   Specialty: Cardiology Why: Office will give you a call with time and date of appointment Contact information: [address] [place] [address] [phone]            Contact information for after-discharge care    Destination    [facility] Preferred SNF .   Service: Skilled Nursing Contact information: [address] [place] [phone]                  Major procedures and Radiology Reports - PLEASE review detailed and final reports thoroughly  -      TTE  - 1. Left ventricular ejection fraction, by estimation, is 60 to 65%. The left ventricle has normal function. The left ventricle has no regional wall motion abnormalities. There is mild concentric left ventricular hypertrophy. Left ventricular diastolic parameters are consistent with Grade II diastolic dysfunction  (pseudonormalization). Elevated left atrial pressure.  2. Right ventricular systolic function is normal. The right ventricular size is normal. There is normal pulmonary artery systolic pressure.  3. Left atrial size was mildly dilated.  4. The mitral valve is normal in structure. Mild mitral valve regurgitation. No evidence of mitral stenosis.  5. The aortic valve is normal in structure. Aortic valve regurgitation is not visualized. No aortic stenosis is present.  6. The inferior vena cava is normal in size with greater than 50% respiratory variability, suggesting right atrial pressure of 3 mmHg.    DG Chest Port 1 View  Result Date: 11/20/2019 CLINICAL DATA:  Shortness of breath, cough. EXAM: PORTABLE CHEST 1 VIEW COMPARISON:  Nov 19, 2019. FINDINGS: Stable  cardiomediastinal silhouette. Sternotomy wires are noted. No pneumothorax or pleural effusion is noted. Stable fibrotic changes noted in the lung bases. No definite acute abnormality is noted. Bony thorax is unremarkable. IMPRESSION: Stable bibasilar fibrotic changes are noted. No definite acute cardiopulmonary abnormality seen. Electronically Signed   By: Marijo Conception M.D.   On: 11/20/2019 08:32   DG Chest Port 1 View  Result Date: 11/19/2019 CLINICAL DATA:  Shortness of breath EXAM: PORTABLE CHEST 1 VIEW COMPARISON:  Nov 12, 2019 chest radiograph and chest CT September 19, 2019 FINDINGS: There is fibrotic change scattered throughout the lungs, most notably in the mid and lower lung regions, essentially stable. No new opacity evident. Heart is upper normal in size with pulmonary vascularity normal. Patient is status post coronary artery bypass grafting. No adenopathy appreciable. There is aortic atherosclerosis. No evident bone lesions. IMPRESSION: Stable fibrotic type change, most severe in the mid and lower lung regions. No new opacity evident. Stable cardiac silhouette. Postoperative changes noted. Aortic Atherosclerosis (ICD10-I70.0). Electronically Signed   By: Lowella Grip III M.D.   On: 11/19/2019 08:24   DG Chest Port 1 View  Result Date: 11/12/2019 CLINICAL DATA:  Shortness of breath and sepsis. Fever. Productive cough.  EXAM: PORTABLE CHEST 1 VIEW COMPARISON:  10/28/2019.  09/19/2019. FINDINGS: The patient has a history of pulmonary fibrosis and emphysema. Chronic lung markings appear similar allowing for technical differences. Upper lobe emphysema as seen previously. No sign of dense consolidation, collapse or effusion. Previous median sternotomy and CABG. Aortic atherosclerosis. No acute bone finding. IMPRESSION: Portable film with the poor inspiration. Chronic fibrotic lung disease and upper lung emphysema. No sign of acute infiltrate, collapse or effusion. Electronically Signed   By: Nelson Chimes M.D.   On: 11/12/2019 19:37   DG Swallowing Func-Speech Pathology  Result Date: 11/28/2019 Objective Swallowing Evaluation: Type of Study: MBS-Modified Barium Swallow Study  Patient Details Name: Orel Cooler MRN: [id number] Date of Birth: Apr 06, 1941 Today's Date: 11/28/2019 Time: SLP Start Time (ACUTE ONLY): 1415 -SLP Stop Time (ACUTE ONLY): 1440 SLP Time Calculation (min) (ACUTE ONLY): 25 min Past Medical History: Past Medical History: Diagnosis Date . Anemia  . Coronary artery disease  . GERD (gastroesophageal reflux disease)  . Hypertension  . Pulmonary fibrosis ([place])  . Schizo affective schizophrenia [facility])  Past Surgical History: Past Surgical History: Procedure Laterality Date . CORONARY ARTERY BYPASS GRAFT   HPI: Deontray Hunnicutt is a 79 y.o. male with history of GERD,schizoaffective schizophrenia, dysphagia, CAD, pulmonary fibrosis on home oxygen Covid test came back positive. BSE 09/18/19 placed on nectar and Dys 3.  MBS 3/17 revealing delayed swallow, penetration (PAS 3) with thin via straw, no aspiration however increased risk therefore continued nectar thick. CXR Stable bibasilar fibrotic changes are noted. No definite acute  Subjective: pt awake in bed Assessment / Plan / Recommendation CHL IP CLINICAL IMPRESSIONS 11/28/2019 Clinical Impression Pt demonstrates a primary impairment in timing of oropharyngeal bolus transit. There is transient slight delay in laryngeal closure leading to inconsistent silent aspiraiton events with thin liquids. At times, aspirate/penetrate is ejected with subsequent laryngeal squeeze. At other times penetrate accumulates and is aspirated without sensation post swallow. A chin tuck did not consistently prevent aspiration, nor did controlling bolus size or rate of intake. Given ongoing COVID recovery recommend pt continue nectar thick liquids for the next 7-14 days though eventual upgrade should be consisdered. Pt may consumed mechanical soft solids given adequate  mastication.   SLP Visit Diagnosis Dysphagia, unspecified (R13.10) Attention and concentration deficit following -- Frontal lobe and executive function deficit following -- Impact on safety and function Moderate aspiration risk   CHL IP TREATMENT RECOMMENDATION 11/28/2019 Treatment Recommendations Therapy as outlined in treatment plan below   Prognosis 11/20/2019 Prognosis for Safe Diet Advancement Good Barriers to Reach Goals Cognitive deficits Barriers/Prognosis Comment -- CHL IP DIET RECOMMENDATION 11/28/2019 SLP Diet Recommendations Dysphagia 3 (Mech soft) solids;Nectar thick liquid Liquid Administration via Cup;Straw Medication Administration Crushed with puree Compensations Minimize environmental distractions;Slow rate;Small sips/bites Postural Changes Seated upright at 90 degrees   CHL IP OTHER RECOMMENDATIONS 11/20/2019 Recommended Consults -- Oral Care Recommendations -- Other Recommendations Order thickener from pharmacy   CHL IP FOLLOW UP RECOMMENDATIONS 11/28/2019 Follow up Recommendations Skilled Nursing facility   [facility] IP FREQUENCY AND DURATION 11/28/2019 Speech Therapy Frequency (ACUTE ONLY) min 2x/week Treatment Duration 2 weeks      CHL IP ORAL PHASE 11/28/2019 Oral Phase WFL Oral - Pudding Teaspoon -- Oral - Pudding Cup -- Oral - Honey Teaspoon -- Oral - Honey Cup -- Oral - Nectar Teaspoon -- Oral - Nectar Cup -- Oral - Nectar Straw -- Oral - Thin Teaspoon -- Oral - Thin Cup -- Oral - Thin Straw -- Oral -  Puree -- Oral - Mech Soft -- Oral - Regular -- Oral - Multi-Consistency -- Oral - Pill -- Oral Phase - Comment --  CHL IP PHARYNGEAL PHASE 11/28/2019 Pharyngeal Phase Impaired Pharyngeal- Pudding Teaspoon -- Pharyngeal -- Pharyngeal- Pudding Cup -- Pharyngeal -- Pharyngeal- Honey Teaspoon -- Pharyngeal -- Pharyngeal- Honey Cup -- Pharyngeal -- Pharyngeal- Nectar Teaspoon -- Pharyngeal -- Pharyngeal- Nectar Cup WFL Pharyngeal -- Pharyngeal- Nectar Straw WFL Pharyngeal -- Pharyngeal- Thin Teaspoon --  Pharyngeal -- Pharyngeal- Thin Cup Delayed swallow initiation-pyriform sinuses;Penetration/Aspiration before swallow;Penetration/Aspiration during swallow;Trace aspiration Pharyngeal Material enters airway, passes BELOW cords without attempt by patient to eject out (silent aspiration);Material enters airway, passes BELOW cords then ejected out;Material enters airway, CONTACTS cords and then ejected out;Material does not enter airway Pharyngeal- Thin Straw Delayed swallow initiation-pyriform sinuses;Penetration/Aspiration before swallow;Penetration/Aspiration during swallow;Trace aspiration Pharyngeal Material enters airway, passes BELOW cords without attempt by patient to eject out (silent aspiration);Material enters airway, passes BELOW cords then ejected out;Material enters airway, CONTACTS cords and then ejected out;Material does not enter airway Pharyngeal- Puree Pharyngeal residue - valleculae Pharyngeal -- Pharyngeal- Mechanical Soft -- Pharyngeal -- Pharyngeal- Regular Pharyngeal residue - valleculae Pharyngeal -- Pharyngeal- Multi-consistency -- Pharyngeal -- Pharyngeal- Pill -- Pharyngeal -- Pharyngeal Comment --  No flowsheet data found. DeBlois, Katherene Ponto 11/28/2019, 3:29 PM              ECHOCARDIOGRAM COMPLETE  Result Date: 11/14/2019    ECHOCARDIOGRAM REPORT   Patient Name:   TAESHAUN RAMES Date of Exam: 11/14/2019 Medical Rec #:  [id number]    Height:       68.0 in Accession #:    [id number]   Weight:       153.0 lb Date of Birth:  Jul 28, 1940    BSA:          1.824 m Patient Age:    79 years     BP:           131/61 mmHg Patient Gender: M            HR:           58 bpm. Exam Location:  Inpatient Procedure: 2D Echo, Cardiac Doppler and Color Doppler Indications:    Elevated Troponin  History:        Patient has prior history of Echocardiogram examinations, most                 recent 09/06/2019. CHF, CAD; Risk Factors:Hypertension,                 Dyslipidemia and Former Smoker.  Sonographer:     Vickie Epley RDCS Referring Phys: [id number] RHONDA G BARRETT  Sonographer Comments: Covid positive. IMPRESSIONS  1. Left ventricular ejection fraction, by estimation, is 60 to 65%. The left ventricle has normal function. The left ventricle has no regional wall motion abnormalities. There is mild concentric left ventricular hypertrophy. Left ventricular diastolic parameters are consistent with Grade II diastolic dysfunction (pseudonormalization). Elevated left atrial pressure.  2. Right ventricular systolic function is normal. The right ventricular size is normal. There is normal pulmonary artery systolic pressure.  3. Left atrial size was mildly dilated.  4. The mitral valve is normal in structure. Mild mitral valve regurgitation. No evidence of mitral stenosis.  5. The aortic valve is normal in structure. Aortic valve regurgitation is not visualized. No aortic stenosis is present.  6. The inferior vena cava is normal in size with greater than 50% respiratory variability, suggesting right atrial pressure  of 3 mmHg. FINDINGS  Left Ventricle: Left ventricular ejection fraction, by estimation, is 60 to 65%. The left ventricle has normal function. The left ventricle has no regional wall motion abnormalities. The left ventricular internal cavity size was normal in size. There is  mild concentric left ventricular hypertrophy. Left ventricular diastolic parameters are consistent with Grade II diastolic dysfunction (pseudonormalization). Elevated left atrial pressure. Right Ventricle: The right ventricular size is normal. No increase in right ventricular wall thickness. Right ventricular systolic function is normal. There is normal pulmonary artery systolic pressure. The tricuspid regurgitant velocity is 2.25 m/s, and  with an assumed right atrial pressure of 3 mmHg, the estimated right ventricular systolic pressure is 03.4 mmHg. Left Atrium: Left atrial size was mildly dilated. Right Atrium: Right atrial size was normal in size.  Pericardium: There is no evidence of pericardial effusion. Mitral Valve: The mitral valve is normal in structure. Normal mobility of the mitral valve leaflets. Mild mitral valve regurgitation. No evidence of mitral valve stenosis. Tricuspid Valve: The tricuspid valve is normal in structure. Tricuspid valve regurgitation is not demonstrated. No evidence of tricuspid stenosis. Aortic Valve: The aortic valve is normal in structure. Aortic valve regurgitation is not visualized. No aortic stenosis is present. Pulmonic Valve: The pulmonic valve was normal in structure. Pulmonic valve regurgitation is not visualized. No evidence of pulmonic stenosis. Aorta: The aortic root is normal in size and structure. Venous: The inferior vena cava is normal in size with greater than 50% respiratory variability, suggesting right atrial pressure of 3 mmHg. IAS/Shunts: No atrial level shunt detected by color flow Doppler.  LEFT VENTRICLE PLAX 2D LVIDd:         4.70 cm      Diastology LVIDs:         3.42 cm      LV e' lateral:   7.14 cm/s LV PW:         0.80 cm      LV E/e' lateral: 12.3 LV IVS:        0.80 cm      LV e' medial:    4.62 cm/s LVOT diam:     1.90 cm      LV E/e' medial:  19.0 LV SV:         45 LV SV Index:   25 LVOT Area:     2.84 cm  LV Volumes (MOD) LV vol d, MOD A2C: 103.0 ml LV vol d, MOD A4C: 115.0 ml LV vol s, MOD A2C: 61.9 ml LV vol s, MOD A4C: 46.6 ml LV SV MOD A2C:     41.1 ml LV SV MOD A4C:     115.0 ml LV SV MOD BP:      57.4 ml RIGHT VENTRICLE RV S prime:     10.10 cm/s TAPSE (M-mode): 1.6 cm LEFT ATRIUM             Index       RIGHT ATRIUM           Index LA diam:        4.20 cm 2.30 cm/m  RA Area:     10.30 cm LA Vol (A2C):   24.4 ml 13.38 ml/m RA Volume:   18.10 ml  9.92 ml/m LA Vol (A4C):   33.3 ml 18.26 ml/m LA Biplane Vol: 31.4 ml 17.22 ml/m  AORTIC VALVE LVOT Vmax:   70.00 cm/s LVOT Vmean:  40.000 cm/s LVOT VTI:    0.160 m  AORTA Ao  Root diam: 3.10 cm MITRAL VALVE               TRICUSPID VALVE MV  Area (PHT): 5.02 cm    TR Peak grad:   20.2 mmHg MV Decel Time: 151 msec    TR Vmax:        225.00 cm/s MV E velocity: 87.80 cm/s MV A velocity: 70.30 cm/s  SHUNTS MV E/A ratio:  1.25        Systemic VTI:  0.16 m                            Systemic Diam: 1.90 cm Ena Dawley MD Electronically signed by Ena Dawley MD Signature Date/Time: 11/14/2019/1:13:59 PM    Final     Micro Results     No results found for this or any previous visit (from the past 240 hour(s)).  Today   Subjective    Brysun Eschmann today has no headache,no chest abdominal pain,no new weakness tingling or numbness, feels much better wants to go home today.     Objective   Blood pressure (!) 120/56, pulse (!) 102, temperature 97.7 F (36.5 C), temperature source Oral, resp. rate 20, height 5' 8"  (1.727 m), weight 69.4 kg, SpO2 97 %.   Intake/Output Summary (Last 24 hours) at 11/30/2019 1649 Last data filed at 11/30/2019 1421 Gross per 24 hour  Intake 820 ml  Output 1500 ml  Net -680 ml    Exam  Awake Alert, No new F.N deficits, Normal affect [place].AT,PERRAL Supple Neck,No JVD, No cervical lymphadenopathy appriciated.  Symmetrical Chest wall movement, Good air movement bilaterally, CTAB RRR,No Gallops,Rubs or new Murmurs, No Parasternal Heave +ve B.Sounds, Abd Soft, Non tender, No organomegaly appriciated, No rebound -guarding or rigidity. No Cyanosis, Clubbing or edema, No new Rash or bruise   Data Review   CBC w Diff:  Lab Results  Component Value Date   WBC 5.9 11/28/2019   HGB 8.4 (L) 11/28/2019   HCT 26.0 (L) 11/28/2019   PLT 166 11/28/2019   LYMPHOPCT 17 11/27/2019   MONOPCT 11 11/27/2019   EOSPCT 2 11/27/2019   BASOPCT 0 11/27/2019    CMP:  Lab Results  Component Value Date   NA 137 11/28/2019   NA 133 (L) 10/10/2019   K 3.8 11/28/2019   CL 107 11/28/2019   CO2 19 (L) 11/28/2019   BUN 30 (H) 11/28/2019   BUN 37 (H) 10/10/2019   CREATININE 2.01 (H) 11/28/2019   PROT 5.3 (L)  11/27/2019   ALBUMIN 1.8 (L) 11/27/2019   BILITOT 1.0 11/27/2019   ALKPHOS 67 11/27/2019   AST 20 11/27/2019   ALT 26 11/27/2019  .   Total Time in preparing paper work, data evaluation and todays exam - 49 minutes  Lala Lund M.D on 11/30/2019 at 4:49 PM  [facility]   Office  [phone]

## 2019-11-17 LAB — GLUCOSE, CAPILLARY
Glucose-Capillary: 155 mg/dL — ABNORMAL HIGH (ref 70–99)
Glucose-Capillary: 182 mg/dL — ABNORMAL HIGH (ref 70–99)
Glucose-Capillary: 154 mg/dL — ABNORMAL HIGH (ref 70–99)
Glucose-Capillary: 130 mg/dL — ABNORMAL HIGH (ref 70–99)

## 2019-11-17 LAB — CULTURE, BLOOD (ROUTINE X 2)
Culture: NO GROWTH
Culture: NO GROWTH

## 2019-11-17 MED ORDER — FUROSEMIDE 10 MG/ML IJ SOLN
40.0000 mg | Freq: Once | INTRAMUSCULAR | Status: AC
  Administered 2019-11-17: 40 mg via INTRAVENOUS
  Filled 2019-11-17: qty 4

## 2019-11-17 MED ORDER — ZOLPIDEM TARTRATE 5 MG PO TABS
5.0000 mg | ORAL_TABLET | Freq: Every evening | ORAL | Status: DC | PRN
  Administered 2019-11-17 – 2019-11-23 (×5): 5 mg via ORAL
  Filled 2019-11-17 (×5): qty 1

## 2019-11-17 MED ORDER — LIDOCAINE HCL URETHRAL/MUCOSAL 2 % EX GEL
1.0000 "application " | Freq: Once | CUTANEOUS | Status: DC
  Filled 2019-11-17 (×2): qty 6

## 2019-11-17 MED ORDER — FUROSEMIDE 40 MG PO TABS: 40.0000 mg | ORAL_TABLET | Freq: Every day | ORAL | Status: DC

## 2019-11-17 NOTE — Progress Notes (Signed)
Verbal order from Dr. Candiss Norse for Ambien 5 mg qhs PRN

## 2019-11-17 NOTE — Plan of Care (Signed)
Patient A&O x2-3. VSS. Pt on 4 L of oxygen sating above 93%. Lungs sounds regular and diminished. Air borne/contact precautions maintained.Bed alarm on. Free from falls. Frequent turns in bed. Assisted  Patient with fluids and meals.  Pt voiding adequately during shift. Bladder  Scan less than 300 ml. DVT prophylactic: lovenox. No c/o of pain. BG managed.Bed wheels locked. Phone and call bell within reach. Pt is resting, no distress.  Problem: Education: Goal: Knowledge of General Education information will improve Description: Including pain rating scale, medication(s)/side effects and non-pharmacologic comfort measures Outcome: Progressing   Problem: Health Behavior/Discharge Planning: Goal: Ability to manage health-related needs will improve Outcome: Progressing   Problem: Clinical Measurements: Goal: Ability to maintain clinical measurements within normal limits will improve Outcome: Progressing Goal: Will remain free from infection Outcome: Progressing Goal: Diagnostic test results will improve Outcome: Progressing Goal: Respiratory complications will improve Outcome: Progressing Goal: Cardiovascular complication will be avoided Outcome: Progressing   Problem: Activity: Goal: Risk for activity intolerance will decrease Outcome: Progressing   Problem: Nutrition: Goal: Adequate nutrition will be maintained Outcome: Progressing   Problem: Coping: Goal: Level of anxiety will decrease Outcome: Progressing   Problem: Elimination: Goal: Will not experience complications related to bowel motility Outcome: Progressing Goal: Will not experience complications related to urinary retention Outcome: Progressing   Problem: Pain Managment: Goal: General experience of comfort will improve Outcome: Progressing   Problem: Safety: Goal: Ability to remain free from injury will improve Outcome: Progressing   Problem: Skin Integrity: Goal: Risk for impaired skin integrity will  decrease Outcome: Progressing   Problem: Education: Goal: Knowledge of risk factors and measures for prevention of condition will improve Outcome: Progressing   Problem: Coping: Goal: Psychosocial and spiritual needs will be supported Outcome: Progressing   Problem: Respiratory: Goal: Will maintain a patent airway Outcome: Progressing Goal: Complications related to the disease process, condition or treatment will be avoided or minimized Outcome: Progressing

## 2019-11-17 NOTE — Progress Notes (Signed)
[facility]                                                                                                                                                                         Patient Demographics  Robert Little, is a 79 y.o. male  DUK:[id number]  BJS:[id number]  DOB - March 26, 1941  Admit date - 11/12/2019  Admitting Physician Rise Patience, MD  Outpatient Primary MD for the patient is [name]  LOS - 5   Chief Complaint  Patient presents with  . Shortness of Breath        Assessment & Plan    Patient seen briefly today due for discharge soon per Discharge done on 11/16/19 by me, no further issues, Vital signs stable, patient feels fine.      Medications  Scheduled Meds: . albuterol  2 puff Inhalation Q2200  . amLODipine  10 mg Oral Daily  . aspirin EC  81 mg Oral Daily  . atorvastatin  20 mg Oral QHS  . benztropine  0.5 mg Oral BID  . carvedilol  3.125 mg Oral BID WC  . clonazePAM  0.5 mg Oral BID  . dexamethasone (DECADRON) injection  6 mg Intravenous Q24H  . ferrous sulfate  325 mg Oral BID WC  . fluticasone furoate-vilanterol  1 puff Inhalation Daily  . furosemide  40 mg Intravenous Once  . [START ON 11/18/2019] furosemide  40 mg Oral Daily  . heparin injection (subcutaneous)  5,000 Units Subcutaneous Q8H  . insulin aspart  0-6 Units Subcutaneous TID WC  . insulin glargine  14 Units Subcutaneous Daily  . lidocaine  1 application Urethral Once  . pantoprazole  40 mg Oral Daily  . potassium chloride SA  20 mEq Oral Daily  . tamsulosin  0.4 mg Oral Daily  . umeclidinium bromide  1 puff Inhalation Daily  . vitamin B-12  500 mcg Oral BID   Continuous Infusions: PRN Meds:.[DISCONTINUED] ondansetron **OR** ondansetron (ZOFRAN) IV    Time Spent in minutes   10 minutes   Lala Lund M.D on 11/17/2019 at 8:27 AM  Between 7am to 7pm - Pager -  [phone]  After 7pm go to [email or web address] - password [email or web address]  And look for the night coverage person covering for me after hours  [facility] Office  [phone]    Subjective:   [name] today has, No headache, No chest pain, No abdominal pain - No Nausea, No new weakness tingling or numbness, No Cough - SOB.   Objective:   Vitals:   11/16/19 2000 11/16/19 2002 11/17/19 0515 11/17/19 0819  BP: 115/64 115/64 (!) 101/45 122/72  Pulse: 99  82 (!) 121  Resp: 20  20 (!)  27  Temp: 97.6 F (36.4 C)  97.6 F (36.4 C)   TempSrc: Oral  Oral   SpO2: (!) 84%  98% 93%  Weight:      Height:        Wt Readings from Last 3 Encounters:  11/13/19 69.4 kg  10/28/19 69.4 kg  10/10/19 69.4 kg     Intake/Output Summary (Last 24 hours) at 11/17/2019 0827 Last data filed at 11/16/2019 1515 Gross per 24 hour  Intake 480 ml  Output 645 ml  Net -165 ml    Exam Awake Alert,  No new F.N deficits, Normal affect [place].AT,PERRAL Supple Neck,No JVD, No cervical lymphadenopathy appriciated.  Symmetrical Chest wall movement, Good air movement bilaterally, CTAB RRR,No Gallops,Rubs or new Murmurs, No Parasternal Heave +ve B.Sounds, Abd Soft, Non tender, No organomegaly appriciated, No rebound - guarding or rigidity. No Cyanosis, Clubbing or edema, No new Rash or bruise     Data Review

## 2019-11-18 LAB — COMPREHENSIVE METABOLIC PANEL
ALT: 31 U/L (ref 0–44)
AST: 38 U/L (ref 15–41)
Albumin: 3.6 g/dL (ref 3.5–5.0)
Alkaline Phosphatase: 82 U/L (ref 38–126)
Anion gap: 16 — ABNORMAL HIGH (ref 5–15)
BUN: 93 mg/dL — ABNORMAL HIGH (ref 8–23)
CO2: 21 mmol/L — ABNORMAL LOW (ref 22–32)
Calcium: 9.5 mg/dL (ref 8.9–10.3)
Chloride: 105 mmol/L (ref 98–111)
Creatinine, Ser: 3.22 mg/dL — ABNORMAL HIGH (ref 0.61–1.24)
GFR calc Af Amer: 20 mL/min — ABNORMAL LOW (ref >60)
GFR calc non Af Amer: 18 mL/min — ABNORMAL LOW (ref >60)
Glucose, Bld: 166 mg/dL — ABNORMAL HIGH (ref 70–99)
Potassium: 4.4 mmol/L (ref 3.5–5.1)
Sodium: 142 mmol/L (ref 135–145)
Total Bilirubin: 1.3 mg/dL — ABNORMAL HIGH (ref 0.3–1.2)
Total Protein: 7.1 g/dL (ref 6.5–8.1)

## 2019-11-18 LAB — CBC WITH DIFFERENTIAL/PLATELET
Abs Immature Granulocytes: 0.82 10*3/uL — ABNORMAL HIGH (ref 0.00–0.07)
Basophils Absolute: 0.1 10*3/uL (ref 0.0–0.1)
Basophils Relative: 0 %
Eosinophils Absolute: 0 10*3/uL (ref 0.0–0.5)
Eosinophils Relative: 0 %
HCT: 28.8 % — ABNORMAL LOW (ref 39.0–52.0)
Hemoglobin: 9.5 g/dL — ABNORMAL LOW (ref 13.0–17.0)
Immature Granulocytes: 5 %
Lymphocytes Relative: 12 %
Lymphs Abs: 2 10*3/uL (ref 0.7–4.0)
MCH: 29.6 pg (ref 26.0–34.0)
MCHC: 33 g/dL (ref 30.0–36.0)
MCV: 89.7 fL (ref 80.0–100.0)
Monocytes Absolute: 1.1 10*3/uL — ABNORMAL HIGH (ref 0.1–1.0)
Monocytes Relative: 6 %
Neutro Abs: 13.2 10*3/uL — ABNORMAL HIGH (ref 1.7–7.7)
Neutrophils Relative %: 77 %
Platelets: 361 10*3/uL (ref 150–400)
RBC: 3.21 MIL/uL — ABNORMAL LOW (ref 4.22–5.81)
RDW: 15.6 % — ABNORMAL HIGH (ref 11.5–15.5)
WBC: 17.2 10*3/uL — ABNORMAL HIGH (ref 4.0–10.5)
nRBC: 0.5 % — ABNORMAL HIGH (ref 0.0–0.2)

## 2019-11-18 LAB — GLUCOSE, CAPILLARY
Glucose-Capillary: 109 mg/dL — ABNORMAL HIGH (ref 70–99)
Glucose-Capillary: 145 mg/dL — ABNORMAL HIGH (ref 70–99)
Glucose-Capillary: 130 mg/dL — ABNORMAL HIGH (ref 70–99)
Glucose-Capillary: 123 mg/dL — ABNORMAL HIGH (ref 70–99)

## 2019-11-18 MED ORDER — DILTIAZEM HCL 25 MG/5ML IV SOLN
10.0000 mg | Freq: Once | INTRAVENOUS | Status: AC
  Administered 2019-11-18: 10 mg via INTRAVENOUS
  Filled 2019-11-18: qty 5

## 2019-11-18 MED ORDER — LIDOCAINE HCL (CARDIAC) PF 100 MG/5ML IV SOSY
PREFILLED_SYRINGE | INTRAVENOUS | Status: AC
  Filled 2019-11-18: qty 5

## 2019-11-18 MED ORDER — METOPROLOL TARTRATE 5 MG/5ML IV SOLN
5.0000 mg | INTRAVENOUS | Status: AC | PRN
  Administered 2019-11-18 (×2): 5 mg via INTRAVENOUS
  Filled 2019-11-18 (×2): qty 5

## 2019-11-18 MED ORDER — HALOPERIDOL LACTATE 5 MG/ML IJ SOLN
2.0000 mg | Freq: Once | INTRAMUSCULAR | Status: AC
  Administered 2019-11-18: 2 mg via INTRAVENOUS
  Filled 2019-11-18: qty 1

## 2019-11-18 MED ORDER — DIGOXIN 0.25 MG/ML IJ SOLN
0.1250 mg | Freq: Four times a day (QID) | INTRAMUSCULAR | Status: AC
  Administered 2019-11-18 (×2): 0.125 mg via INTRAVENOUS
  Filled 2019-11-18 (×2): qty 2

## 2019-11-18 MED ORDER — LACTATED RINGERS IV BOLUS
500.0000 mL | Freq: Once | INTRAVENOUS | Status: AC
  Administered 2019-11-18: 500 mL via INTRAVENOUS

## 2019-11-18 MED ORDER — DILTIAZEM HCL-DEXTROSE 125-5 MG/125ML-% IV SOLN (PREMIX)
5.0000 mg/h | INTRAVENOUS | Status: DC
  Administered 2019-11-18: 5 mg/h via INTRAVENOUS
  Filled 2019-11-18 (×3): qty 125

## 2019-11-18 MED ORDER — CHLORHEXIDINE GLUCONATE CLOTH 2 % EX PADS
6.0000 | MEDICATED_PAD | Freq: Every day | CUTANEOUS | Status: DC
  Administered 2019-11-18 – 2019-11-30 (×13): 6 via TOPICAL

## 2019-11-18 MED ORDER — DILTIAZEM HCL 25 MG/5ML IV SOLN
10.0000 mg | Freq: Four times a day (QID) | INTRAVENOUS | Status: DC | PRN
  Administered 2019-11-18: 10 mg via INTRAVENOUS
  Filled 2019-11-18 (×2): qty 5

## 2019-11-18 MED ORDER — DILTIAZEM LOAD VIA INFUSION
10.0000 mg | Freq: Once | INTRAVENOUS | Status: AC
  Administered 2019-11-18: 10 mg via INTRAVENOUS
  Filled 2019-11-18: qty 10

## 2019-11-18 MED ORDER — ALPRAZOLAM 0.25 MG PO TABS
0.2500 mg | ORAL_TABLET | Freq: Once | ORAL | Status: AC
  Administered 2019-11-18: 0.25 mg via ORAL
  Filled 2019-11-18: qty 1

## 2019-11-18 MED ORDER — SODIUM CHLORIDE 0.9 % IV BOLUS
500.0000 mL | Freq: Once | INTRAVENOUS | Status: AC
  Administered 2019-11-18: 500 mL via INTRAVENOUS

## 2019-11-18 NOTE — Progress Notes (Signed)
pts heart rate back up to 140's-150's sustaining, pt restless in bed. Dr. Candiss Norse notified, orders placed for Cardizem drip. Will continue to monitor.

## 2019-11-18 NOTE — Significant Event (Addendum)
Rapid Response Event Note  Overview: "Second Set of Eyes"  Initial Focused Assessment: Called by nurse with concerns of patient having increased HR, increased RR, and altered mental status - not new - this is ongoing. Per nurse, patient was started on Cardizem for AF, has been impulsive, and at times his respiratory rate is high coupled with increased WOB.  Upon arrival, patient was restless, appeared quite fatigued, endorsed that he was tired and wanted to sleep. HR 90-100s AF - seems fairly controlled on Cardizem 10mg  IV, saturations 91-96% on 4L HFNC, RR was elevated but patient did not seem like he was in distress, rather he looked more like he might be delirious -- looking around the room, trying to reach for something that was not there (Hx of Schizo-Affective Disorder). He was able to tell me his name but when I asked if he knew his birth-date or where he was, he said yes but then was not able to provide that information. Lung sounds - clear throughout. + bruising on arm   We repositioned him in the bed and VS were - 115/52 (61), HR 90-100s AF, RR 28-32, 93% on 4L [place].  Interventions: -- No RRT Interventions  Plan of Care: -- Monitor VS  -- Encourage OOB and proning/IS -- Delirium Precautions -- patient might need a sleeping aid at to help normalize wake/sleep cycle.   Event Summary: Call Time 1312 Arrival Time 1317 End Time 1336  I called for an update at 1505 - per nurse, patient is about the same in presentation. >800 cc urine present when he was bladder scanned - RN to place foley.  Kyleigha Markert R

## 2019-11-18 NOTE — Progress Notes (Signed)
Cardizem drip initiated at 5 mg/ hr.

## 2019-11-18 NOTE — Progress Notes (Signed)
Pts heart rate sustaining in 140's as high as 160 Dr. Candiss Norse notified,

## 2019-11-18 NOTE — Progress Notes (Signed)
PROGRESS NOTE                                                                                                                                                                                                             Patient Demographics:    Robert Little, is a 79 y.o. male, DOB - Oct 27, 1940, HBZ:[id number]  Outpatient Primary MD for the patient is [facility], [name]    LOS - 6  Admit date - 11/12/2019    Chief Complaint  Patient presents with  . Shortness of Breath       Brief Narrative Robert Little is a 79 y.o. male with history of his affective disorder, CAD, pulmonary fibrosis on home oxygen usually 2 L anemia hypertension diabetes mellitus recently admitted for hypoglycemia with use of sulfonylureas in the setting of chronic kidney disease was brought to the ER after patient has been having productive cough over the last 3 days feeling weak and tired not eating well.  ER he was diagnosed with acute hypoxic respiratory failure due to COVID-19 pneumonia, dehydration AKI and NSTEMI.   Subjective:   Patient in bed, appears comfortable, denies any headache, no fever, no chest pain or pressure, no shortness of breath , no abdominal pain. No focal weakness.   Assessment  & Plan :    1. Sepsis with Acute Hypoxic Resp. Failure due to Acute Covid 19 Viral Pneumonitis during the ongoing 2020 Covid 19 Pandemic - he has moderate disease has been started on IV steroids and remdesivir, will monitor closely.  Poor candidate for Actemra due to underlying frail status.  Sepsis pathophysiology seems to have resolved.  Note he uses 2 L nasal cannula oxygen at baseline.  Encouraged the patient to sit up in chair in the daytime use I-S and flutter valve for pulmonary toiletry and then prone in bed when at night.  Will advance activity and titrate down oxygen as possible.  SpO2: 99 % O2 Flow Rate (L/min): 4 L/min  Recent  Labs  Lab 11/12/19 1916 11/12/19 1917 11/13/19 0755 11/14/19 0503 11/15/19 0622 11/16/19 0409  CRP  --   --  7.0* 8.9* 5.3* 4.4*  DDIMER  --   --  1.75*  1.60* 1.43* 1.04* 0.87*  BNP  --  155.7* 689.0* 605.4* 255.2* 234.9*  PROCALCITON <0.10  --  <0.10  --   --   --  SARSCOV2NAA POSITIVE*  --   --   --   --   --     Hepatic Function Latest Ref Rng & Units 11/18/2019 11/16/2019 11/15/2019  Total Protein 6.5 - 8.1 g/dL 7.1 6.2(L) 5.9(L)  Albumin 3.5 - 5.0 g/dL 3.6 3.2(L) 2.8(L)  AST 15 - 41 U/L 38 32 34  ALT 0 - 44 U/L _0 Alk Phosphatase 38 - 126 U/L 82 63 63  Total Bilirubin 0.3 - 1.2 mg/dL 1.3(H) 0.9 0.7    2.  Dehydration with AKI on underlying CKD 4 with baseline creatinine of around 2.5. Now close to baseline after hydration with IV fluids.  3.  History of CAD. Not NSTEMI.  Could be due to demand ischemia from hypoxia, continue aspirin, statin and beta-blocker for secondary prevention, stable echocardiogram, stable EKG and chest pain-free seen by cardiology continue present care with outpatient cardiology follow-up post discharge.  4. History of schizoaffective disorder takes monthly injection of Invega and daily on Klonopin and Cogentin.  5. History of pulmonary fibrosis now on 2 L home oxygen.  Continue supportive care with a liters as needed.  Outpatient pulmonary follow-up.  6.  Anemia of chronic disease.  Stable will monitor.  7.  Chronic diastolic CHF.  EF on recent echocardiogram was 60%.  Appears compensated we will resume home dose Lasix.  8.  Chronically elevated D-dimers.  Previously has had negative lower extremity venous duplex, currently on heparin drip, will trend and monitor.    9.  Essential hypertension.  On Coreg + Cardizem  10.  Paroxysmal atrial fibrillation with [place] vas 2 score of at least 3.  We will have to start him on Cardizem drip on 11/18/2019 because of elevated heart rate, failed IV digoxin and IV Cardizem push on top of oral  beta-blocker, appears poor candidate for anticoagulation due to high fall risk, continue aspirin and monitor.    11.DM type II.  For now sliding scale, addedm low-dose Lantus as CBGs are running high due to steroid use.   Lab Results  Component Value Date   HGBA1C 7.0 (H) 09/20/2019   CBG (last 3)  Recent Labs    11/17/19 1551 11/17/19 2141 11/18/19 0732  GLUCAP 154* 130* 109*     Condition - Extremely Guarded  Family Communication  :  None  Code Status :  Full  Consults  :  Cards  Disposition Plan  :    Status is: Inpatient  Remains inpatient appropriate because:Inpatient level of care appropriate due to severity of illness   Dispo: The patient is from: Group home              Anticipated d/c is to: Group home              Anticipated d/c date is: 3 days              Patient currently is not medically stable to d/c.  Will need completion of COVID-19 infection and acute hypoxic respiratory failure.   Procedures  :    TTE - 1. Left ventricular ejection fraction, by estimation, is 60 to 65%. The left ventricle has normal function. The left ventricle has no regional wall motion abnormalities. There is mild concentric left ventricular hypertrophy. Left ventricular diastolic parameters are consistent with Grade II diastolic dysfunction  (pseudonormalization). Elevated left atrial pressure.  2. Right ventricular systolic function is normal. The right ventricular size is normal. There is normal pulmonary artery systolic pressure.  3. Left atrial size was mildly dilated.  4. The mitral valve is normal in structure. Mild mitral valve regurgitation. No evidence of mitral stenosis.  5. The aortic valve is normal in structure. Aortic valve regurgitation is not visualized. No aortic stenosis is present.  6. The inferior vena cava is normal in size with greater than 50% respiratory variability, suggesting right atrial pressure of 3 mmHg.   PUD Prophylaxis :  None  DVT  Prophylaxis  :   Heparin   Lab Results  Component Value Date   PLT 361 11/18/2019    Diet :  Diet Order            Diet heart healthy/carb modified Room service appropriate? Yes; Fluid consistency: Thin  Diet effective now               Inpatient Medications  Scheduled Meds: . albuterol  2 puff Inhalation Q2200  . aspirin EC  81 mg Oral Daily  . atorvastatin  20 mg Oral QHS  . benztropine  0.5 mg Oral BID  . carvedilol  3.125 mg Oral BID WC  . clonazePAM  0.5 mg Oral BID  . digoxin  0.125 mg Intravenous Q6H  . diltiazem  10 mg Intravenous Once  . ferrous sulfate  325 mg Oral BID WC  . fluticasone furoate-vilanterol  1 puff Inhalation Daily  . heparin injection (subcutaneous)  5,000 Units Subcutaneous Q8H  . insulin aspart  0-6 Units Subcutaneous TID WC  . lidocaine  1 application Urethral Once  . pantoprazole  40 mg Oral Daily  . potassium chloride SA  20 mEq Oral Daily  . tamsulosin  0.4 mg Oral Daily  . umeclidinium bromide  1 puff Inhalation Daily  . vitamin B-12  500 mcg Oral BID   Continuous Infusions: . diltiazem (CARDIZEM) infusion     PRN Meds:.diltiazem, [DISCONTINUED] ondansetron **OR** ondansetron (ZOFRAN) IV, zolpidem  Antibiotics  :    Anti-infectives (From admission, onward)   Start     Dose/Rate Route Frequency Ordered Stop   11/13/19 1600  remdesivir 100 mg in sodium chloride 0.9 % 100 mL IVPB     100 mg 200 mL/hr over 30 Minutes Intravenous Daily 11/12/19 2146 11/16/19 1700   11/12/19 2300  remdesivir 200 mg in sodium chloride 0.9% 250 mL IVPB     200 mg 580 mL/hr over 30 Minutes Intravenous Once 11/12/19 2146 11/13/19 0104   11/12/19 2130  cefTRIAXone (ROCEPHIN) 2 g in sodium chloride 0.9 % 100 mL IVPB  Status:  Discontinued     2 g 200 mL/hr over 30 Minutes Intravenous  Once 11/12/19 2056 11/12/19 2125   11/12/19 2130  azithromycin (ZITHROMAX) 500 mg in sodium chloride 0.9 % 250 mL IVPB  Status:  Discontinued     500 mg 250 mL/hr over 60  Minutes Intravenous  Once 11/12/19 2056 11/12/19 2125       Time Spent in minutes  30   Lala Lund M.D on 11/18/2019 at 10:43 AM  To page go to [email or web address] - password [facility]  [facility] -  Office  [phone]  See all Orders from today for further details    Objective:   Vitals:   11/18/19 0806 11/18/19 0831 11/18/19 0937 11/18/19 0940  BP: 101/65  125/62 125/61  Pulse: (!) 137 84    Resp: (!) 22 18  (!) 36  Temp:      TempSrc:      SpO2: 91% 99%    Weight:  Height:        Wt Readings from Last 3 Encounters:  11/13/19 69.4 kg  10/28/19 69.4 kg  10/10/19 69.4 kg     Intake/Output Summary (Last 24 hours) at 11/18/2019 1043 Last data filed at 11/18/2019 2563 Gross per 24 hour  Intake --  Output 450 ml  Net -450 ml     Physical Exam  Awake  , No new F.N deficits, Normal affect [place].AT,PERRAL Supple Neck,No JVD, No cervical lymphadenopathy appriciated.  Symmetrical Chest wall movement, Good air movement bilaterally, CTAB iRRR, No Gallops, Rubs or new Murmurs, No Parasternal Heave +ve B.Sounds, Abd Soft, No tenderness, No organomegaly appriciated, No rebound - guarding or rigidity. No Cyanosis, Clubbing or edema, No new Rash or bruise    Data Review:    CBC Recent Labs  Lab 11/12/19 1916 11/12/19 1939 11/13/19 0755 11/14/19 0503 11/15/19 0622 11/16/19 0409 11/18/19 0322  WBC 5.2   < > 4.4 5.0 11.0* 10.7* 17.2*  HGB 9.4*   < > 9.0* 9.4* 8.8* 8.7* 9.5*  HCT 29.3*   < > 28.7* 29.0* 27.5* 26.7* 28.8*  PLT 200   < > 179 212 266 258 361  MCV 91.0   < > 93.2 90.6 89.9 89.0 89.7  MCH 29.2   < > 29.2 29.4 28.8 29.0 29.6  MCHC 32.1   < > 31.4 32.4 32.0 32.6 33.0  RDW 15.3   < > 15.5 15.6* 15.8* 15.6* 15.6*  LYMPHSABS 0.9  --   --  0.9 1.1 1.0 2.0  MONOABS 0.3  --   --  0.3 0.5 0.6 1.1*  EOSABS 0.0  --   --  0.0 0.0 0.0 0.0  BASOSABS 0.0  --   --  0.0 0.0 0.0 0.1   < > = values in this interval not displayed.    Chemistries  Recent Labs   Lab 11/12/19 1916 11/12/19 1939 11/13/19 0755 11/14/19 0503 11/15/19 0622 11/16/19 0409 11/18/19 0322  NA 137   < > 142 139 142 142 142  K 4.5   < > 3.8 4.1 4.1 4.5 4.4  CL 103   < > 112* 112* 111 108 105  CO2 17*   < > 17* 18* 20* 23 21*  GLUCOSE 181*   < > 112* 205* 167* 179* 166*  BUN 67*   < > 54* 52* 53* 58* 93*  CREATININE 3.08*   < > 2.51* 2.32* 2.08* 2.14* 3.22*  CALCIUM 8.0*   < > 7.0* 7.4* 8.2* 8.9 9.5  AST 25   < > 34 39 34 32 38  ALT 17   < > _0 ALKPHOS 65   < > 58 63 63 63 82  BILITOT 0.7   < > 0.6 0.4 0.7 0.9 1.3*  MG  --   --  0.8* 1.9 1.7 1.7  --   INR 1.0  --   --   --   --   --   --    < > = values in this interval not displayed.     ------------------------------------------------------------------------------------------------------------------ No results for input(s): CHOL, HDL, LDLCALC, TRIG, CHOLHDL, LDLDIRECT in the last 72 hours.  Lab Results  Component Value Date   HGBA1C 7.0 (H) 09/20/2019   ------------------------------------------------------------------------------------------------------------------ No results for input(s): TSH, T4TOTAL, T3FREE, THYROIDAB in the last 72 hours.  Invalid input(s): FREET3  Cardiac Enzymes No results for input(s): CKMB, TROPONINI, MYOGLOBIN in the last 168 hours.  Invalid input(s):  CK ------------------------------------------------------------------------------------------------------------------    Component Value Date/Time   BNP 234.9 (H) 11/16/2019 0409    Micro Results Recent Results (from the past 240 hour(s))  SARS Coronavirus 2 by RT PCR (hospital order, performed in [facility] hospital lab) Nasopharyngeal Nasopharyngeal Swab     Status: Abnormal   Collection Time: 11/12/19  7:16 PM   Specimen: Nasopharyngeal Swab  Result Value Ref Range Status   SARS Coronavirus 2 POSITIVE (A) NEGATIVE Final    Comment: RESULT CALLED TO, READ BACK BY AND VERIFIED WITH: T KELLY RN 11/12/19 2119  JDW (NOTE) SARS-CoV-2 target nucleic acids are DETECTED SARS-CoV-2 RNA is generally detectable in upper respiratory specimens  during the acute phase of infection.  Positive results are indicative  of the presence of the identified virus, but do not rule out bacterial infection or co-infection with other pathogens not detected by the test.  Clinical correlation with patient history and  other diagnostic information is necessary to determine patient infection status.  The expected result is negative. Fact Sheet for Patients:   StrictlyIdeas.no  Fact Sheet for Healthcare Providers:   BankingDealers.co.za   This test is not yet approved or cleared by the [place] FDA and  has been authorized for detection and/or diagnosis of SARS-CoV-2 by FDA under an Emergency Use Authorization (EUA).  This EUA will remain in effect (meaning this test can be used)  for the duration of  the COVID-19 declaration under Section 564(b)(1) of the Act, 21 U.S.C. section 360-bbb-3(b)(1), unless the authorization is terminated or revoked sooner. Performed at [facility], [place] [address] [place], [place] [address]   Blood Culture (routine x 2)     Status: None   Collection Time: 11/12/19  7:16 PM   Specimen: BLOOD  Result Value Ref Range Status   Specimen Description BLOOD LEFT FOREARM  Final   Special Requests   Final    BOTTLES DRAWN AEROBIC AND ANAEROBIC Blood Culture results may not be optimal due to an excessive volume of blood received in culture bottles   Culture   Final    NO GROWTH 5 DAYS Performed at [facility], [place] [address] [place], [place] [address]    Report Status 11/17/2019 FINAL  Final  Urine culture     Status: None   Collection Time: 11/12/19  7:16 PM   Specimen: In/Out Cath Urine  Result Value Ref Range Status   Specimen Description IN/OUT CATH URINE  Final   Special Requests NONE  Final   Culture   Final    NO  GROWTH Performed at [facility], [place] [address] [place], [place] [address]    Report Status 11/13/2019 FINAL  Final  Blood Culture (routine x 2)     Status: None   Collection Time: 11/12/19  7:21 PM   Specimen: BLOOD  Result Value Ref Range Status   Specimen Description BLOOD RIGHT FOREARM  Final   Special Requests   Final    BOTTLES DRAWN AEROBIC AND ANAEROBIC Blood Culture results may not be optimal due to an inadequate volume of blood received in culture bottles   Culture   Final    NO GROWTH 5 DAYS Performed at [facility], [place] [address] [place], [place] [address]    Report Status 11/17/2019 FINAL  Final    Radiology Reports CT Head Wo Contrast  Result Date: 10/28/2019 CLINICAL DATA:  Un witnessed fall, weakness, confusion EXAM: CT HEAD WITHOUT CONTRAST TECHNIQUE:  Contiguous axial images were obtained from the base of the skull through the vertex without intravenous contrast. COMPARISON:  None. FINDINGS: Brain: Hypodensities in the periventricular white matter, most pronounced in the left frontal region, most likely reflect chronic small vessel ischemic change. No signs of acute infarct or hemorrhage. Lateral ventricles and remaining midline structures are unremarkable. No acute extra-axial fluid collections. No mass effect. Vascular: No hyperdense vessel or unexpected calcification. Skull: Normal. Negative for fracture or focal lesion. Sinuses/Orbits: Mucoperiosteal thickening within the maxillary sinuses, right greater than left. Remaining sinuses are clear. Other: None. IMPRESSION: 1. No acute intracranial trauma. 2. Likely chronic small vessel ischemic changes within the frontal periventricular white matter. Electronically Signed   By: Randa Ngo M.D.   On: 10/28/2019 19:31   DG Chest Port 1 View  Result Date: 11/12/2019 CLINICAL DATA:  Shortness of breath and sepsis. Fever. Productive cough. EXAM: PORTABLE CHEST 1 VIEW COMPARISON:  10/28/2019.  09/19/2019.  FINDINGS: The patient has a history of pulmonary fibrosis and emphysema. Chronic lung markings appear similar allowing for technical differences. Upper lobe emphysema as seen previously. No sign of dense consolidation, collapse or effusion. Previous median sternotomy and CABG. Aortic atherosclerosis. No acute bone finding. IMPRESSION: Portable film with the poor inspiration. Chronic fibrotic lung disease and upper lung emphysema. No sign of acute infiltrate, collapse or effusion. Electronically Signed   By: Nelson Chimes M.D.   On: 11/12/2019 19:37   DG Chest Portable 1 View  Result Date: 10/28/2019 CLINICAL DATA:  79 year old male with altered mental status. EXAM: PORTABLE CHEST 1 VIEW COMPARISON:  Chest radiograph dated 09/19/2019 and CT dated 09/19/2019 FINDINGS: Diffuse interstitial scarring in keeping with fibrosis. No new consolidation. There is no pleural effusion or pneumothorax. Stable cardiomediastinal silhouette. Atherosclerotic calcification of the aorta. Median sternotomy wires and CABG vascular clips. IMPRESSION: 1. No acute cardiopulmonary process. 2. Fibrotic changes. Electronically Signed   By: Anner Crete M.D.   On: 10/28/2019 21:16   ECHOCARDIOGRAM COMPLETE  Result Date: 11/14/2019    ECHOCARDIOGRAM REPORT   Patient Name:   MARKCUS LAZENBY Date of Exam: 11/14/2019 Medical Rec #:  [id number]    Height:       68.0 in Accession #:    [id number]   Weight:       153.0 lb Date of Birth:  [birth year]    BSA:          1.824 m Patient Age:    64 years     BP:           131/61 mmHg Patient Gender: M            HR:           58 bpm. Exam Location:  Inpatient Procedure: 2D Echo, Cardiac Doppler and Color Doppler Indications:    Elevated Troponin  History:        Patient has prior history of Echocardiogram examinations, most                 recent 09/06/2019. CHF, CAD; Risk Factors:Hypertension,                 Dyslipidemia and Former Smoker.  Sonographer:    Vickie Epley RDCS Referring Phys: [id number] RHONDA G  BARRETT  Sonographer Comments: Covid positive. IMPRESSIONS  1. Left ventricular ejection fraction, by estimation, is 60 to 65%. The left ventricle has normal function. The left ventricle has no regional wall motion abnormalities. There is mild concentric left ventricular hypertrophy. Left  ventricular diastolic parameters are consistent with Grade II diastolic dysfunction (pseudonormalization). Elevated left atrial pressure.  2. Right ventricular systolic function is normal. The right ventricular size is normal. There is normal pulmonary artery systolic pressure.  3. Left atrial size was mildly dilated.  4. The mitral valve is normal in structure. Mild mitral valve regurgitation. No evidence of mitral stenosis.  5. The aortic valve is normal in structure. Aortic valve regurgitation is not visualized. No aortic stenosis is present.  6. The inferior vena cava is normal in size with greater than 50% respiratory variability, suggesting right atrial pressure of 3 mmHg. FINDINGS  Left Ventricle: Left ventricular ejection fraction, by estimation, is 60 to 65%. The left ventricle has normal function. The left ventricle has no regional wall motion abnormalities. The left ventricular internal cavity size was normal in size. There is  mild concentric left ventricular hypertrophy. Left ventricular diastolic parameters are consistent with Grade II diastolic dysfunction (pseudonormalization). Elevated left atrial pressure. Right Ventricle: The right ventricular size is normal. No increase in right ventricular wall thickness. Right ventricular systolic function is normal. There is normal pulmonary artery systolic pressure. The tricuspid regurgitant velocity is 2.25 m/s, and  with an assumed right atrial pressure of 3 mmHg, the estimated right ventricular systolic pressure is 15.0 mmHg. Left Atrium: Left atrial size was mildly dilated. Right Atrium: Right atrial size was normal in size. Pericardium: There is no evidence of  pericardial effusion. Mitral Valve: The mitral valve is normal in structure. Normal mobility of the mitral valve leaflets. Mild mitral valve regurgitation. No evidence of mitral valve stenosis. Tricuspid Valve: The tricuspid valve is normal in structure. Tricuspid valve regurgitation is not demonstrated. No evidence of tricuspid stenosis. Aortic Valve: The aortic valve is normal in structure. Aortic valve regurgitation is not visualized. No aortic stenosis is present. Pulmonic Valve: The pulmonic valve was normal in structure. Pulmonic valve regurgitation is not visualized. No evidence of pulmonic stenosis. Aorta: The aortic root is normal in size and structure. Venous: The inferior vena cava is normal in size with greater than 50% respiratory variability, suggesting right atrial pressure of 3 mmHg. IAS/Shunts: No atrial level shunt detected by color flow Doppler.  LEFT VENTRICLE PLAX 2D LVIDd:         4.70 cm      Diastology LVIDs:         3.42 cm      LV e' lateral:   7.14 cm/s LV PW:         0.80 cm      LV E/e' lateral: 12.3 LV IVS:        0.80 cm      LV e' medial:    4.62 cm/s LVOT diam:     1.90 cm      LV E/e' medial:  19.0 LV SV:         45 LV SV Index:   25 LVOT Area:     2.84 cm  LV Volumes (MOD) LV vol d, MOD A2C: 103.0 ml LV vol d, MOD A4C: 115.0 ml LV vol s, MOD A2C: 61.9 ml LV vol s, MOD A4C: 46.6 ml LV SV MOD A2C:     41.1 ml LV SV MOD A4C:     115.0 ml LV SV MOD BP:      57.4 ml RIGHT VENTRICLE RV S prime:     10.10 cm/s TAPSE (M-mode): 1.6 cm LEFT ATRIUM  Index       RIGHT ATRIUM           Index LA diam:        4.20 cm 2.30 cm/m  RA Area:     10.30 cm LA Vol (A2C):   24.4 ml 13.38 ml/m RA Volume:   18.10 ml  9.92 ml/m LA Vol (A4C):   33.3 ml 18.26 ml/m LA Biplane Vol: 31.4 ml 17.22 ml/m  AORTIC VALVE LVOT Vmax:   70.00 cm/s LVOT Vmean:  40.000 cm/s LVOT VTI:    0.160 m  AORTA Ao Root diam: 3.10 cm MITRAL VALVE               TRICUSPID VALVE MV Area (PHT): 5.02 cm    TR Peak grad:    20.2 mmHg MV Decel Time: 151 msec    TR Vmax:        225.00 cm/s MV E velocity: 87.80 cm/s MV A velocity: 70.30 cm/s  SHUNTS MV E/A ratio:  1.25        Systemic VTI:  0.16 m                            Systemic Diam: 1.90 cm Ena Dawley MD Electronically signed by Ena Dawley MD Signature Date/Time: 11/14/2019/1:13:59 PM    Final

## 2019-11-18 NOTE — Progress Notes (Addendum)
   11/18/19 1405  Urine Characteristics  Bladder Scan Volume (mL) 831 mL   Dr. Candiss Norse notified , verbal order to place coude catheter

## 2019-11-19 ENCOUNTER — Inpatient Hospital Stay (HOSPITAL_COMMUNITY): Payer: Medicare Other

## 2019-11-19 LAB — BASIC METABOLIC PANEL
Anion gap: 15 (ref 5–15)
BUN: 106 mg/dL — ABNORMAL HIGH (ref 8–23)
CO2: 18 mmol/L — ABNORMAL LOW (ref 22–32)
Calcium: 9 mg/dL (ref 8.9–10.3)
Chloride: 114 mmol/L — ABNORMAL HIGH (ref 98–111)
Creatinine, Ser: 3.59 mg/dL — ABNORMAL HIGH (ref 0.61–1.24)
GFR calc Af Amer: 18 mL/min — ABNORMAL LOW (ref >60)
GFR calc non Af Amer: 15 mL/min — ABNORMAL LOW (ref >60)
Glucose, Bld: 183 mg/dL — ABNORMAL HIGH (ref 70–99)
Potassium: 5 mmol/L (ref 3.5–5.1)
Sodium: 147 mmol/L — ABNORMAL HIGH (ref 135–145)

## 2019-11-19 LAB — CBC
HCT: 28.3 % — ABNORMAL LOW (ref 39.0–52.0)
Hemoglobin: 9.1 g/dL — ABNORMAL LOW (ref 13.0–17.0)
MCH: 29 pg (ref 26.0–34.0)
MCHC: 32.2 g/dL (ref 30.0–36.0)
MCV: 90.1 fL (ref 80.0–100.0)
Platelets: 324 10*3/uL (ref 150–400)
RBC: 3.14 MIL/uL — ABNORMAL LOW (ref 4.22–5.81)
RDW: 16.1 % — ABNORMAL HIGH (ref 11.5–15.5)
WBC: 14.2 10*3/uL — ABNORMAL HIGH (ref 4.0–10.5)
nRBC: 0.7 % — ABNORMAL HIGH (ref 0.0–0.2)

## 2019-11-19 LAB — GLUCOSE, CAPILLARY
Glucose-Capillary: 162 mg/dL — ABNORMAL HIGH (ref 70–99)
Glucose-Capillary: 202 mg/dL — ABNORMAL HIGH (ref 70–99)
Glucose-Capillary: 287 mg/dL — ABNORMAL HIGH (ref 70–99)
Glucose-Capillary: 289 mg/dL — ABNORMAL HIGH (ref 70–99)

## 2019-11-19 LAB — D-DIMER, QUANTITATIVE: D-Dimer, Quant: 1.48 ug/mL-FEU — ABNORMAL HIGH (ref 0.00–0.50)

## 2019-11-19 LAB — PROCALCITONIN: Procalcitonin: <0.1 ng/mL

## 2019-11-19 LAB — C-REACTIVE PROTEIN: CRP: 4.3 mg/dL — ABNORMAL HIGH (ref <1.0)

## 2019-11-19 LAB — MAGNESIUM: Magnesium: 1.9 mg/dL (ref 1.7–2.4)

## 2019-11-19 LAB — BRAIN NATRIURETIC PEPTIDE: B Natriuretic Peptide: 559.3 pg/mL — ABNORMAL HIGH (ref 0.0–100.0)

## 2019-11-19 MED ORDER — DIGOXIN 0.25 MG/ML IJ SOLN
0.2500 mg | Freq: Once | INTRAMUSCULAR | Status: AC
  Administered 2019-11-19: 0.25 mg via INTRAVENOUS
  Filled 2019-11-19: qty 2

## 2019-11-19 MED ORDER — AMIODARONE HCL IN DEXTROSE 360-4.14 MG/200ML-% IV SOLN
60.0000 mg/h | INTRAVENOUS | Status: AC
  Administered 2019-11-19: 60 mg/h via INTRAVENOUS
  Filled 2019-11-19: qty 200

## 2019-11-19 MED ORDER — AMIODARONE HCL IN DEXTROSE 360-4.14 MG/200ML-% IV SOLN
30.0000 mg/h | INTRAVENOUS | Status: DC
  Administered 2019-11-19: 30 mg/h via INTRAVENOUS
  Filled 2019-11-19 (×3): qty 200

## 2019-11-19 MED ORDER — AMIODARONE LOAD VIA INFUSION
150.0000 mg | Freq: Once | INTRAVENOUS | Status: AC
  Administered 2019-11-19: 150 mg via INTRAVENOUS
  Filled 2019-11-19: qty 83.34

## 2019-11-19 MED ORDER — DEXTROSE 5 % IV SOLN: INTRAVENOUS | Status: AC

## 2019-11-19 NOTE — Progress Notes (Signed)
PROGRESS NOTE                                                                                                                                                                                                             Patient Demographics:    Robert Little, is a 79 y.o. male, DOB - [birth year], AQT:[id number]  Outpatient Primary MD for the patient is [name]    LOS - 7  Admit date - 11/12/2019    Chief Complaint  Patient presents with  . Shortness of Breath       Brief Narrative -  Robert Little is a 79 y.o. male with history of his affective disorder, CAD, pulmonary fibrosis on home oxygen usually 2 L anemia hypertension diabetes mellitus recently admitted for hypoglycemia with use of sulfonylureas in the setting of chronic kidney disease was brought to the ER after patient has been having productive cough over the last 3 days feeling weak and tired not eating well.  ER he was diagnosed with acute hypoxic respiratory failure due to COVID-19 pneumonia, dehydration AKI and NSTEMI.   Subjective:   Patient in bed, appears comfortable, denies any headache, no fever, no chest pain or pressure, mild shortness of breath , no abdominal pain. No focal weakness.   Assessment  & Plan :    1. Sepsis with Acute Hypoxic Resp. Failure due to Acute Covid 19 Viral Pneumonitis during the ongoing 2020 Covid 19 Pandemic - he has moderate disease has been started on IV steroids and remdesivir, will monitor closely.  Poor candidate for Actemra due to underlying frail status.  Sepsis pathophysiology seems to have resolved.  Note he uses 2 L nasal cannula oxygen at baseline.  Encouraged the patient to sit up in chair in the daytime use I-S and flutter valve for pulmonary toiletry and then prone in bed when at night.  Will advance activity and titrate down oxygen as possible.   SpO2: 100 % O2 Flow Rate (L/min): 2  L/min  Recent Labs  Lab 11/12/19 1916 11/12/19 1917 11/13/19 0755 11/14/19 0503 11/15/19 0622 11/16/19 0409 11/19/19 0731  CRP  --   --  7.0* 8.9* 5.3* 4.4* 4.3*  DDIMER  --   --  1.75*  1.60* 1.43* 1.04* 0.87* 1.48*  BNP  --    < > 689.0*  605.4* 255.2* 234.9* 559.3*  PROCALCITON <0.10  --  <0.10  --   --   --  <0.10  SARSCOV2NAA POSITIVE*  --   --   --   --   --   --    < > = values in this interval not displayed.    Hepatic Function Latest Ref Rng & Units 11/18/2019 11/16/2019 11/15/2019  Total Protein 6.5 - 8.1 g/dL 7.1 6.2(L) 5.9(L)  Albumin 3.5 - 5.0 g/dL 3.6 3.2(L) 2.8(L)  AST 15 - 41 U/L 38 32 34  ALT 0 - 44 U/L _0 Alk Phosphatase 38 - 126 U/L 82 63 63  Total Bilirubin 0.3 - 1.2 mg/dL 1.3(H) 0.9 0.7    2.  Dehydration with AKI on underlying CKD 4 with baseline creatinine of around 2.5. IVF now and monitor, developed Urinary retention, Foley-Flomax.  3.  History of CAD. Not NSTEMI.  Could be due to demand ischemia from hypoxia, continue aspirin, statin and beta-blocker for secondary prevention, stable echocardiogram, stable EKG and chest pain-free seen by cardiology continue present care with outpatient cardiology follow-up post discharge.  4. History of schizoaffective disorder takes monthly injection of Invega and daily on Klonopin and Cogentin. Follows at [facility] follow up.  According to the POA he has been extremely weak for the last 6 months and they were unable to take him to his psychiatrist for follow-up.  5. History of pulmonary fibrosis now on 2 L home oxygen.  Continue supportive care with a liters as needed.  Outpatient pulmonary follow-up.  6.  Anemia of chronic disease.  Stable will monitor.  7.  Chronic diastolic CHF.  EF on recent echocardiogram was 60%.  Appears dehydrated, hold Lasix, D5 W and monitor.  8.  Chronically elevated D-dimers.  Previously has had negative lower extremity venous duplex, currently on heparin drip, will trend and  monitor.    9.  Essential hypertension.  Now hypotensive - IVF and monitor, discontinue Coreg.  10.  Paroxysmal atrial fibrillation with [place] vas 2 score of at least 3. with Hypotension, IV Amio + Digoxin ( 2 doses) poor anticoagulation candidate due to fall risk .    11. DM type II.  For now sliding scale, addedm low-dose Lantus as CBGs are running high due to steroid use.   Lab Results  Component Value Date   HGBA1C 7.0 (H) 09/20/2019   CBG (last 3)  Recent Labs    11/18/19 1606 11/18/19 2109 11/19/19 0811  GLUCAP 130* 123* 162*     Condition - Extremely Guarded  Family Communication  :  DW POA - [name].  [name] states that for the last 6 months he has had multiple hospitalizations and he is getting so weak that he was not able to see his psychiatrist for the last 6 months as well, however he somehow has some unrealistic expectations of outcome and was told that this to [name] is a 79 year old gentleman with multiple medical problems and psychiatric problems and now has moderate to severe Covid infection with complications arising from it which provides a poor and guarded prognosis.  Code Status :  DNR  Consults  :  Cards, Psych  Disposition Plan  :    Status is: Inpatient  Remains inpatient appropriate because:Inpatient level of care appropriate due to severity of illness  Dispo: The patient is from: Group home  Anticipated d/c is to: Group home              Anticipated d/c date is: 3 days              Patient currently is not medically stable to d/c. Covid, Afib RVR, AKI     Procedures  :    TTE - 1. Left ventricular ejection fraction, by estimation, is 60 to 65%. The left ventricle has normal function. The left ventricle has no regional wall motion abnormalities. There is mild concentric left ventricular hypertrophy. Left ventricular diastolic parameters are consistent with Grade II diastolic  dysfunction  (pseudonormalization). Elevated left atrial pressure.  2. Right ventricular systolic function is normal. The right ventricular size is normal. There is normal pulmonary artery systolic pressure.  3. Left atrial size was mildly dilated.  4. The mitral valve is normal in structure. Mild mitral valve regurgitation. No evidence of mitral stenosis.  5. The aortic valve is normal in structure. Aortic valve regurgitation is not visualized. No aortic stenosis is present.  6. The inferior vena cava is normal in size with greater than 50% respiratory variability, suggesting right atrial pressure of 3 mmHg.   PUD Prophylaxis :  None  DVT Prophylaxis  :   Heparin   Lab Results  Component Value Date   PLT 324 11/19/2019    Diet :  Diet Order            Diet heart healthy/carb modified Room service appropriate? Yes; Fluid consistency: Thin  Diet effective now               Inpatient Medications  Scheduled Meds: . albuterol  2 puff Inhalation Q2200  . amiodarone  150 mg Intravenous Once  . aspirin EC  81 mg Oral Daily  . atorvastatin  20 mg Oral QHS  . benztropine  0.5 mg Oral BID  . Chlorhexidine Gluconate Cloth  6 each Topical Daily  . clonazePAM  0.5 mg Oral BID  . digoxin  0.25 mg Intravenous Once  . ferrous sulfate  325 mg Oral BID WC  . fluticasone furoate-vilanterol  1 puff Inhalation Daily  . heparin injection (subcutaneous)  5,000 Units Subcutaneous Q8H  . insulin aspart  0-6 Units Subcutaneous TID WC  . lidocaine  1 application Urethral Once  . pantoprazole  40 mg Oral Daily  . potassium chloride SA  20 mEq Oral Daily  . tamsulosin  0.4 mg Oral Daily  . umeclidinium bromide  1 puff Inhalation Daily  . vitamin B-12  500 mcg Oral BID   Continuous Infusions: . amiodarone     Followed by  . amiodarone    . dextrose     PRN Meds:.[DISCONTINUED] ondansetron **OR** ondansetron (ZOFRAN) IV, zolpidem  Antibiotics  :    Anti-infectives (From admission,  onward)   Start     Dose/Rate Route Frequency Ordered Stop   11/13/19 1600  remdesivir 100 mg in sodium chloride 0.9 % 100 mL IVPB     100 mg 200 mL/hr over 30 Minutes Intravenous Daily 11/12/19 2146 11/16/19 1700   11/12/19 2300  remdesivir 200 mg in sodium chloride 0.9% 250 mL IVPB     200 mg 580 mL/hr over 30 Minutes Intravenous Once 11/12/19 2146 11/13/19 0104   11/12/19 2130  cefTRIAXone (ROCEPHIN) 2 g in sodium chloride 0.9 % 100 mL IVPB  Status:  Discontinued     2 g 200 mL/hr over 30 Minutes Intravenous  Once 11/12/19 2056 11/12/19  2125   11/12/19 2130  azithromycin (ZITHROMAX) 500 mg in sodium chloride 0.9 % 250 mL IVPB  Status:  Discontinued     500 mg 250 mL/hr over 60 Minutes Intravenous  Once 11/12/19 2056 11/12/19 2125       Time Spent in minutes  30   Lala Lund M.D on 11/19/2019 at 11:21 AM  To page go to [email or web address] - password [email or web address]  [facility] -  Office  [phone]  See all Orders from today for further details    Objective:   Vitals:   11/18/19 2343 11/19/19 0400 11/19/19 0820 11/19/19 0902  BP: (!) 102/56 111/64 100/70 (!) 95/53  Pulse:      Resp: (!) 34 20 (!) 23 (!) 24  Temp: 98.4 F (36.9 C) (!) 97.5 F (36.4 C) (!) 97.4 F (36.3 C) (!) 97.5 F (36.4 C)  TempSrc: Axillary Axillary Axillary Axillary  SpO2: 98% 98% 97% 100%  Weight:      Height:        Wt Readings from Last 3 Encounters:  11/13/19 69.4 kg  10/28/19 69.4 kg  10/10/19 69.4 kg     Intake/Output Summary (Last 24 hours) at 11/19/2019 1121 Last data filed at 11/19/2019 0647 Gross per 24 hour  Intake 161.78 ml  Output 1000 ml  Net -838.22 ml     Physical Exam  Awake, mildly confused,  No new F.N deficits,   [place].AT,PERRAL Supple Neck,No JVD, No cervical lymphadenopathy appriciated.  Symmetrical Chest wall movement, Good air movement bilaterally, CTAB iRRR,No Gallops, Rubs or new Murmurs, No Parasternal Heave +ve B.Sounds, Abd Soft, No tenderness, No  organomegaly appriciated, No rebound - guarding or rigidity. No Cyanosis, Clubbing or edema, No new Rash or bruise    Data Review:    CBC Recent Labs  Lab 11/12/19 1916 11/12/19 1939 11/14/19 0503 11/15/19 0622 11/16/19 0409 11/18/19 0322 11/19/19 0731  WBC 5.2   < > 5.0 11.0* 10.7* 17.2* 14.2*  HGB 9.4*   < > 9.4* 8.8* 8.7* 9.5* 9.1*  HCT 29.3*   < > 29.0* 27.5* 26.7* 28.8* 28.3*  PLT 200   < > 212 266 258 361 324  MCV 91.0   < > 90.6 89.9 89.0 89.7 90.1  MCH 29.2   < > 29.4 28.8 29.0 29.6 29.0  MCHC 32.1   < > 32.4 32.0 32.6 33.0 32.2  RDW 15.3   < > 15.6* 15.8* 15.6* 15.6* 16.1*  LYMPHSABS 0.9  --  0.9 1.1 1.0 2.0  --   MONOABS 0.3  --  0.3 0.5 0.6 1.1*  --   EOSABS 0.0  --  0.0 0.0 0.0 0.0  --   BASOSABS 0.0  --  0.0 0.0 0.0 0.1  --    < > = values in this interval not displayed.    Chemistries  Recent Labs  Lab 11/12/19 1916 11/12/19 1939 11/13/19 0755 11/13/19 0755 11/14/19 0503 11/15/19 0622 11/16/19 0409 11/18/19 0322 11/19/19 0731  NA 137   < > 142   < > 139 142 142 142 147*  K 4.5   < > 3.8   < > 4.1 4.1 4.5 4.4 5.0  CL 103   < > 112*   < > 112* 111 108 105 114*  CO2 17*   < > 17*   < > 18* 20* 23 21* 18*  GLUCOSE 181*   < > 112*   < > 205* 167* 179* 166* 183*  BUN 67*   < >  54*   < > 52* 53* 58* 93* 106*  CREATININE 3.08*   < > 2.51*   < > 2.32* 2.08* 2.14* 3.22* 3.59*  CALCIUM 8.0*   < > 7.0*   < > 7.4* 8.2* 8.9 9.5 9.0  AST 25   < > 34  --  39 34 32 38  --   ALT 17   < > 16  --  _0 --   ALKPHOS 65   < > 58  --  63 63 63 82  --   BILITOT 0.7   < > 0.6  --  0.4 0.7 0.9 1.3*  --   MG  --   --  0.8*  --  1.9 1.7 1.7  --  1.9  INR 1.0  --   --   --   --   --   --   --   --    < > = values in this interval not displayed.     ------------------------------------------------------------------------------------------------------------------ No results for input(s): CHOL, HDL, LDLCALC, TRIG, CHOLHDL, LDLDIRECT in the last 72 hours.  Lab  Results  Component Value Date   HGBA1C 7.0 (H) 09/20/2019   ------------------------------------------------------------------------------------------------------------------ No results for input(s): TSH, T4TOTAL, T3FREE, THYROIDAB in the last 72 hours.  Invalid input(s): FREET3  Cardiac Enzymes No results for input(s): CKMB, TROPONINI, MYOGLOBIN in the last 168 hours.  Invalid input(s): CK ------------------------------------------------------------------------------------------------------------------    Component Value Date/Time   BNP 559.3 (H) 11/19/2019 0731    Micro Results Recent Results (from the past 240 hour(s))  SARS Coronavirus 2 by RT PCR (hospital order, performed in [facility] hospital lab) Nasopharyngeal Nasopharyngeal Swab     Status: Abnormal   Collection Time: 11/12/19  7:16 PM   Specimen: Nasopharyngeal Swab  Result Value Ref Range Status   SARS Coronavirus 2 POSITIVE (A) NEGATIVE Final    Comment: RESULT CALLED TO, READ BACK BY AND VERIFIED WITH: T KELLY RN 11/12/19 2119 JDW (NOTE) SARS-CoV-2 target nucleic acids are DETECTED SARS-CoV-2 RNA is generally detectable in upper respiratory specimens  during the acute phase of infection.  Positive results are indicative  of the presence of the identified virus, but do not rule out bacterial infection or co-infection with other pathogens not detected by the test.  Clinical correlation with patient history and  other diagnostic information is necessary to determine patient infection status.  The expected result is negative. Fact Sheet for Patients:   StrictlyIdeas.no  Fact Sheet for Healthcare Providers:   BankingDealers.co.za   This test is not yet approved or cleared by the [place] FDA and  has been authorized for detection and/or diagnosis of SARS-CoV-2 by FDA under an Emergency Use Authorization (EUA).  This EUA will remain in effect (meaning this test  can be used)  for the duration of  the COVID-19 declaration under Section 564(b)(1) of the Act, 21 U.S.C. section 360-bbb-3(b)(1), unless the authorization is terminated or revoked sooner. Performed at [facility], [place] [address] [place], [place] [address]   Blood Culture (routine x 2)     Status: None   Collection Time: 11/12/19  7:16 PM   Specimen: BLOOD  Result Value Ref Range Status   Specimen Description BLOOD LEFT FOREARM  Final   Special Requests   Final    BOTTLES DRAWN AEROBIC AND ANAEROBIC Blood Culture results may not be optimal due to an excessive volume of blood received in culture bottles   Culture  Final    NO GROWTH 5 DAYS Performed at [facility], [place] [address] [place], [place] [address]    Report Status 11/17/2019 FINAL  Final  Urine culture     Status: None   Collection Time: 11/12/19  7:16 PM   Specimen: In/Out Cath Urine  Result Value Ref Range Status   Specimen Description IN/OUT CATH URINE  Final   Special Requests NONE  Final   Culture   Final    NO GROWTH Performed at [facility], [place] [address], [place], [place] [address]    Report Status 11/13/2019 FINAL  Final  Blood Culture (routine x 2)     Status: None   Collection Time: 11/12/19  7:21 PM   Specimen: BLOOD  Result Value Ref Range Status   Specimen Description BLOOD RIGHT FOREARM  Final   Special Requests   Final    BOTTLES DRAWN AEROBIC AND ANAEROBIC Blood Culture results may not be optimal due to an inadequate volume of blood received in culture bottles   Culture   Final    NO GROWTH 5 DAYS Performed at [facility], [place] [address], [place], [place] [address]    Report Status 11/17/2019 FINAL  Final    Radiology Reports CT Head Wo Contrast  Result Date: 10/28/2019 CLINICAL DATA:  Un witnessed fall, weakness, confusion EXAM: CT HEAD WITHOUT CONTRAST TECHNIQUE: Contiguous axial images were obtained from the base of the skull through the vertex without  intravenous contrast. COMPARISON:  None. FINDINGS: Brain: Hypodensities in the periventricular white matter, most pronounced in the left frontal region, most likely reflect chronic small vessel ischemic change. No signs of acute infarct or hemorrhage. Lateral ventricles and remaining midline structures are unremarkable. No acute extra-axial fluid collections. No mass effect. Vascular: No hyperdense vessel or unexpected calcification. Skull: Normal. Negative for fracture or focal lesion. Sinuses/Orbits: Mucoperiosteal thickening within the maxillary sinuses, right greater than left. Remaining sinuses are clear. Other: None. IMPRESSION: 1. No acute intracranial trauma. 2. Likely chronic small vessel ischemic changes within the frontal periventricular white matter. Electronically Signed   By: Randa Ngo M.D.   On: 10/28/2019 19:31   DG Chest Port 1 View  Result Date: 11/19/2019 CLINICAL DATA:  Shortness of breath EXAM: PORTABLE CHEST 1 VIEW COMPARISON:  Nov 12, 2019 chest radiograph and chest CT September 19, 2019 FINDINGS: There is fibrotic change scattered throughout the lungs, most notably in the mid and lower lung regions, essentially stable. No new opacity evident. Heart is upper normal in size with pulmonary vascularity normal. Patient is status post coronary artery bypass grafting. No adenopathy appreciable. There is aortic atherosclerosis. No evident bone lesions. IMPRESSION: Stable fibrotic type change, most severe in the mid and lower lung regions. No new opacity evident. Stable cardiac silhouette. Postoperative changes noted. Aortic Atherosclerosis (ICD10-I70.0). Electronically Signed   By: Lowella Grip III M.D.   On: 11/19/2019 08:24   DG Chest Port 1 View  Result Date: 11/12/2019 CLINICAL DATA:  Shortness of breath and sepsis. Fever. Productive cough. EXAM: PORTABLE CHEST 1 VIEW COMPARISON:  10/28/2019.  09/19/2019. FINDINGS: The patient has a history of pulmonary fibrosis and emphysema.  Chronic lung markings appear similar allowing for technical differences. Upper lobe emphysema as seen previously. No sign of dense consolidation, collapse or effusion. Previous median sternotomy and CABG. Aortic atherosclerosis. No acute bone finding. IMPRESSION: Portable film with the poor inspiration. Chronic fibrotic lung disease and upper lung emphysema. No sign of  acute infiltrate, collapse or effusion. Electronically Signed   By: Nelson Chimes M.D.   On: 11/12/2019 19:37   DG Chest Portable 1 View  Result Date: 10/28/2019 CLINICAL DATA:  79 year old male with altered mental status. EXAM: PORTABLE CHEST 1 VIEW COMPARISON:  Chest radiograph dated 09/19/2019 and CT dated 09/19/2019 FINDINGS: Diffuse interstitial scarring in keeping with fibrosis. No new consolidation. There is no pleural effusion or pneumothorax. Stable cardiomediastinal silhouette. Atherosclerotic calcification of the aorta. Median sternotomy wires and CABG vascular clips. IMPRESSION: 1. No acute cardiopulmonary process. 2. Fibrotic changes. Electronically Signed   By: Anner Crete M.D.   On: 10/28/2019 21:16   ECHOCARDIOGRAM COMPLETE  Result Date: 11/14/2019    ECHOCARDIOGRAM REPORT   Patient Name:   ISAEL STILLE Date of Exam: 11/14/2019 Medical Rec #:  [id number]    Height:       68.0 in Accession #:    [id number]   Weight:       153.0 lb Date of Birth:  05/23/1941    BSA:          1.824 m Patient Age:    62 years     BP:           131/61 mmHg Patient Gender: M            HR:           58 bpm. Exam Location:  Inpatient Procedure: 2D Echo, Cardiac Doppler and Color Doppler Indications:    Elevated Troponin  History:        Patient has prior history of Echocardiogram examinations, most                 recent 09/06/2019. CHF, CAD; Risk Factors:Hypertension,                 Dyslipidemia and Former Smoker.  Sonographer:    Vickie Epley RDCS Referring Phys: [id number] RHONDA G BARRETT  Sonographer Comments: Covid positive. IMPRESSIONS  1. Left  ventricular ejection fraction, by estimation, is 60 to 65%. The left ventricle has normal function. The left ventricle has no regional wall motion abnormalities. There is mild concentric left ventricular hypertrophy. Left ventricular diastolic parameters are consistent with Grade II diastolic dysfunction (pseudonormalization). Elevated left atrial pressure.  2. Right ventricular systolic function is normal. The right ventricular size is normal. There is normal pulmonary artery systolic pressure.  3. Left atrial size was mildly dilated.  4. The mitral valve is normal in structure. Mild mitral valve regurgitation. No evidence of mitral stenosis.  5. The aortic valve is normal in structure. Aortic valve regurgitation is not visualized. No aortic stenosis is present.  6. The inferior vena cava is normal in size with greater than 50% respiratory variability, suggesting right atrial pressure of 3 mmHg. FINDINGS  Left Ventricle: Left ventricular ejection fraction, by estimation, is 60 to 65%. The left ventricle has normal function. The left ventricle has no regional wall motion abnormalities. The left ventricular internal cavity size was normal in size. There is  mild concentric left ventricular hypertrophy. Left ventricular diastolic parameters are consistent with Grade II diastolic dysfunction (pseudonormalization). Elevated left atrial pressure. Right Ventricle: The right ventricular size is normal. No increase in right ventricular wall thickness. Right ventricular systolic function is normal. There is normal pulmonary artery systolic pressure. The tricuspid regurgitant velocity is 2.25 m/s, and  with an assumed right atrial pressure of 3 mmHg, the estimated right ventricular systolic pressure is 08.6 mmHg. Left Atrium: Left atrial  size was mildly dilated. Right Atrium: Right atrial size was normal in size. Pericardium: There is no evidence of pericardial effusion. Mitral Valve: The mitral valve is normal in structure.  Normal mobility of the mitral valve leaflets. Mild mitral valve regurgitation. No evidence of mitral valve stenosis. Tricuspid Valve: The tricuspid valve is normal in structure. Tricuspid valve regurgitation is not demonstrated. No evidence of tricuspid stenosis. Aortic Valve: The aortic valve is normal in structure. Aortic valve regurgitation is not visualized. No aortic stenosis is present. Pulmonic Valve: The pulmonic valve was normal in structure. Pulmonic valve regurgitation is not visualized. No evidence of pulmonic stenosis. Aorta: The aortic root is normal in size and structure. Venous: The inferior vena cava is normal in size with greater than 50% respiratory variability, suggesting right atrial pressure of 3 mmHg. IAS/Shunts: No atrial level shunt detected by color flow Doppler.  LEFT VENTRICLE PLAX 2D LVIDd:         4.70 cm      Diastology LVIDs:         3.42 cm      LV e' lateral:   7.14 cm/s LV PW:         0.80 cm      LV E/e' lateral: 12.3 LV IVS:        0.80 cm      LV e' medial:    4.62 cm/s LVOT diam:     1.90 cm      LV E/e' medial:  19.0 LV SV:         45 LV SV Index:   25 LVOT Area:     2.84 cm  LV Volumes (MOD) LV vol d, MOD A2C: 103.0 ml LV vol d, MOD A4C: 115.0 ml LV vol s, MOD A2C: 61.9 ml LV vol s, MOD A4C: 46.6 ml LV SV MOD A2C:     41.1 ml LV SV MOD A4C:     115.0 ml LV SV MOD BP:      57.4 ml RIGHT VENTRICLE RV S prime:     10.10 cm/s TAPSE (M-mode): 1.6 cm LEFT ATRIUM             Index       RIGHT ATRIUM           Index LA diam:        4.20 cm 2.30 cm/m  RA Area:     10.30 cm LA Vol (A2C):   24.4 ml 13.38 ml/m RA Volume:   18.10 ml  9.92 ml/m LA Vol (A4C):   33.3 ml 18.26 ml/m LA Biplane Vol: 31.4 ml 17.22 ml/m  AORTIC VALVE LVOT Vmax:   70.00 cm/s LVOT Vmean:  40.000 cm/s LVOT VTI:    0.160 m  AORTA Ao Root diam: 3.10 cm MITRAL VALVE               TRICUSPID VALVE MV Area (PHT): 5.02 cm    TR Peak grad:   20.2 mmHg MV Decel Time: 151 msec    TR Vmax:        225.00 cm/s MV E  velocity: 87.80 cm/s MV A velocity: 70.30 cm/s  SHUNTS MV E/A ratio:  1.25        Systemic VTI:  0.16 m                            Systemic Diam: 1.90 cm Ena Dawley MD Electronically signed by Ena Dawley  MD Signature Date/Time: 11/14/2019/1:13:59 PM    Final

## 2019-11-19 NOTE — Consult Note (Signed)
Telepsych Consultation   Reason for Consult:  "Schizophrenia, anxiety and depression" Referring Physician:  Dr. Candiss Norse Location of Patient: [facility] [phone] Location of Provider: [facility]  Patient Identification: Robert Little MRN:  [id number] Principal Diagnosis: Sepsis due to COVID-19 [facility]) Diagnosis:  Principal Problem:   Sepsis due to COVID-19 [facility]) Active Problems:   Coronary artery disease involving native coronary artery of native heart without angina pectoris   Essential hypertension   Pulmonary fibrosis ([place])   Sepsis ([place])   AKI (acute kidney injury) ([place])   CKD (chronic kidney disease), stage IV ([place])   Acute respiratory failure with hypoxia ([place])   Chronic diastolic CHF (congestive heart failure), NYHA class 1 ([place])   Total Time spent with patient: 15 minutes  Subjective:    HPI:   Robert Little is a 79 y.o. male patient.  Nurse practitioner attempted to assess patient, with assistance of attending RN.  Patient does not appear to be able to meaningfully participate in psychiatric evaluation at this time.  Patient currently alert and oriented to self only.  Patient is diagnosed with hearing difficulty, however patient responds minimally verbally.     Past Psychiatric History: Schizoaffective schizophrenia  Risk to Self:   Risk to Others:   Prior Inpatient Therapy:   Prior Outpatient Therapy:    Past Medical History:  Past Medical History:  Diagnosis Date  . Anemia   . Coronary artery disease   . GERD (gastroesophageal reflux disease)   . Hypertension   . Pulmonary fibrosis ([place])   . Schizo affective schizophrenia [facility])     Past Surgical History:  Procedure Laterality Date  . CORONARY ARTERY BYPASS GRAFT     Family History:  Family History  Problem Relation Age of Onset  . Diabetes Mellitus II Mother   . Diabetes Mellitus II Maternal Grandmother   . Heart disease Maternal Grandmother    Family Psychiatric  History: Unknown Social  History:  Social History   Substance and Sexual Activity  Alcohol Use Not Currently     Social History   Substance and Sexual Activity  Drug Use Not Currently    Social History   Socioeconomic History  . Marital status: Unknown    Spouse name: Not on file  . Number of children: Not on file  . Years of education: Not on file  . Highest education level: Not on file  Occupational History  . Not on file  Tobacco Use  . Smoking status: Former Smoker    Types: Cigarettes  . Smokeless tobacco: Never Used  Substance and Sexual Activity  . Alcohol use: Not Currently  . Drug use: Not Currently  . Sexual activity: Not Currently  Other Topics Concern  . Not on file  Social History Narrative  . Not on file   Social Determinants of Health   Financial Resource Strain:   . Difficulty of Paying Living Expenses:   Food Insecurity:   . Worried About Charity fundraiser in the Last Year:   . Arboriculturist in the Last Year:   Transportation Needs:   . Film/video editor (Medical):   Marland Kitchen Lack of Transportation (Non-Medical):   Physical Activity:   . Days of Exercise per Week:   . Minutes of Exercise per Session:   Stress:   . Feeling of Stress :   Social Connections:   . Frequency of Communication with Friends and Family:   . Frequency of Social Gatherings with Friends and Family:   .  Attends Religious Services:   . Active Member of Clubs or Organizations:   . Attends Archivist Meetings:   Marland Kitchen Marital Status:    Additional Social History:    Allergies:  No Known Allergies  Labs:  Results for orders placed or performed during the hospital encounter of 11/12/19 (from the past 48 hour(s))  Glucose, capillary     Status: Abnormal   Collection Time: 11/17/19  9:41 PM  Result Value Ref Range   Glucose-Capillary 130 (H) 70 - 99 mg/dL    Comment: Glucose reference range applies only to samples taken after fasting for at least 8 hours.  Comprehensive metabolic panel      Status: Abnormal   Collection Time: 11/18/19  3:22 AM  Result Value Ref Range   Sodium 142 135 - 145 mmol/L   Potassium 4.4 3.5 - 5.1 mmol/L   Chloride 105 98 - 111 mmol/L   CO2 21 (L) 22 - 32 mmol/L   Glucose, Bld 166 (H) 70 - 99 mg/dL    Comment: Glucose reference range applies only to samples taken after fasting for at least 8 hours.   BUN 93 (H) 8 - 23 mg/dL   Creatinine, Ser 3.22 (H) 0.61 - 1.24 mg/dL    Comment: DELTA CHECK NOTED   Calcium 9.5 8.9 - 10.3 mg/dL   Total Protein 7.1 6.5 - 8.1 g/dL   Albumin 3.6 3.5 - 5.0 g/dL   AST 38 15 - 41 U/L   ALT 31 0 - 44 U/L   Alkaline Phosphatase 82 38 - 126 U/L   Total Bilirubin 1.3 (H) 0.3 - 1.2 mg/dL   GFR calc non Af Amer 18 (L) >60 mL/min   GFR calc Af Amer 20 (L) >60 mL/min   Anion gap 16 (H) 5 - 15    Comment: Performed at [facility] [address] [place], [place] [address]  CBC with Differential/Platelet     Status: Abnormal   Collection Time: 11/18/19  3:22 AM  Result Value Ref Range   WBC 17.2 (H) 4.0 - 10.5 K/uL   RBC 3.21 (L) 4.22 - 5.81 MIL/uL   Hemoglobin 9.5 (L) 13.0 - 17.0 g/dL   HCT 28.8 (L) 39.0 - 52.0 %   MCV 89.7 80.0 - 100.0 fL   MCH 29.6 26.0 - 34.0 pg   MCHC 33.0 30.0 - 36.0 g/dL   RDW 15.6 (H) 11.5 - 15.5 %   Platelets 361 150 - 400 K/uL   nRBC 0.5 (H) 0.0 - 0.2 %   Neutrophils Relative % 77 %   Neutro Abs 13.2 (H) 1.7 - 7.7 K/uL   Lymphocytes Relative 12 %   Lymphs Abs 2.0 0.7 - 4.0 K/uL   Monocytes Relative 6 %   Monocytes Absolute 1.1 (H) 0.1 - 1.0 K/uL   Eosinophils Relative 0 %   Eosinophils Absolute 0.0 0.0 - 0.5 K/uL   Basophils Relative 0 %   Basophils Absolute 0.1 0.0 - 0.1 K/uL   Immature Granulocytes 5 %   Abs Immature Granulocytes 0.82 (H) 0.00 - 0.07 K/uL   Polychromasia PRESENT     Comment: Performed at [facility], [place] [address] [place], [place] [address]  Glucose, capillary     Status: Abnormal   Collection Time: 11/18/19  7:32 AM  Result Value Ref Range    Glucose-Capillary 109 (H) 70 - 99 mg/dL    Comment: Glucose reference range applies only to samples taken after fasting  for at least 8 hours.  Glucose, capillary     Status: Abnormal   Collection Time: 11/18/19 11:29 AM  Result Value Ref Range   Glucose-Capillary 145 (H) 70 - 99 mg/dL    Comment: Glucose reference range applies only to samples taken after fasting for at least 8 hours.  Glucose, capillary     Status: Abnormal   Collection Time: 11/18/19  4:06 PM  Result Value Ref Range   Glucose-Capillary 130 (H) 70 - 99 mg/dL    Comment: Glucose reference range applies only to samples taken after fasting for at least 8 hours.  Glucose, capillary     Status: Abnormal   Collection Time: 11/18/19  9:09 PM  Result Value Ref Range   Glucose-Capillary 123 (H) 70 - 99 mg/dL    Comment: Glucose reference range applies only to samples taken after fasting for at least 8 hours.  Brain natriuretic peptide     Status: Abnormal   Collection Time: 11/19/19  7:31 AM  Result Value Ref Range   B Natriuretic Peptide 559.3 (H) 0.0 - 100.0 pg/mL    Comment: Performed at [facility] [address] [place], [place] [address]  Basic metabolic panel     Status: Abnormal   Collection Time: 11/19/19  7:31 AM  Result Value Ref Range   Sodium 147 (H) 135 - 145 mmol/L   Potassium 5.0 3.5 - 5.1 mmol/L   Chloride 114 (H) 98 - 111 mmol/L   CO2 18 (L) 22 - 32 mmol/L   Glucose, Bld 183 (H) 70 - 99 mg/dL    Comment: Glucose reference range applies only to samples taken after fasting for at least 8 hours.   BUN 106 (H) 8 - 23 mg/dL   Creatinine, Ser 3.59 (H) 0.61 - 1.24 mg/dL   Calcium 9.0 8.9 - 10.3 mg/dL   GFR calc non Af Amer 15 (L) >60 mL/min   GFR calc Af Amer 18 (L) >60 mL/min   Anion gap 15 5 - 15    Comment: Performed at [facility] [address], [place], [place] [address]  CBC     Status: Abnormal   Collection Time: 11/19/19  7:31 AM  Result Value Ref Range   WBC 14.2 (H) 4.0 - 10.5  K/uL   RBC 3.14 (L) 4.22 - 5.81 MIL/uL   Hemoglobin 9.1 (L) 13.0 - 17.0 g/dL   HCT 28.3 (L) 39.0 - 52.0 %   MCV 90.1 80.0 - 100.0 fL   MCH 29.0 26.0 - 34.0 pg   MCHC 32.2 30.0 - 36.0 g/dL   RDW 16.1 (H) 11.5 - 15.5 %   Platelets 324 150 - 400 K/uL   nRBC 0.7 (H) 0.0 - 0.2 %    Comment: Performed at [facility] [address] [place], [place] [address]  C-reactive protein     Status: Abnormal   Collection Time: 11/19/19  7:31 AM  Result Value Ref Range   CRP 4.3 (H) <1.0 mg/dL    Comment: Performed at [facility] [address], [place],  [address]  D-dimer, quantitative (not at [facility])     Status: Abnormal   Collection Time: 11/19/19  7:31 AM  Result Value Ref Range   D-Dimer, Quant 1.48 (H) 0.00 - 0.50 ug/mL-FEU    Comment: (NOTE) At the manufacturer cut-off of 0.50 ug/mL FEU, this assay has been documented to exclude PE with a sensitivity and negative predictive value of  97 to 99%.  At this time, this assay has not been approved by the FDA to exclude DVT/VTE. Results should be correlated with clinical presentation. Performed at [facility], [place] [address] [place], [place] [address]   Magnesium     Status: None   Collection Time: 11/19/19  7:31 AM  Result Value Ref Range   Magnesium 1.9 1.7 - 2.4 mg/dL    Comment: Performed at [facility] [address] [place], [place] [address]  Procalcitonin - Baseline     Status: None   Collection Time: 11/19/19  7:31 AM  Result Value Ref Range   Procalcitonin <0.10 ng/mL    Comment:        Interpretation: PCT (Procalcitonin) <= 0.5 ng/mL: Systemic infection (sepsis) is not likely. Local bacterial infection is possible. (NOTE)       Sepsis PCT Algorithm           Lower Respiratory Tract                                      Infection PCT Algorithm    ----------------------------     ----------------------------         PCT < 0.25 ng/mL                PCT < 0.10 ng/mL         Strongly encourage              Strongly discourage   discontinuation of antibiotics    initiation of antibiotics    ----------------------------     -----------------------------       PCT 0.25 - 0.50 ng/mL            PCT 0.10 - 0.25 ng/mL               OR       >80% decrease in PCT            Discourage initiation of                                            antibiotics      Encourage discontinuation           of antibiotics    ----------------------------     -----------------------------         PCT >= 0.50 ng/mL              PCT 0.26 - 0.50 ng/mL               AND        <80% decrease in PCT             Encourage initiation of                                             antibiotics       Encourage continuation           of antibiotics    ----------------------------     -----------------------------        PCT >= 0.50 ng/mL  PCT > 0.50 ng/mL               AND         increase in PCT                  Strongly encourage                                      initiation of antibiotics    Strongly encourage escalation           of antibiotics                                     -----------------------------                                           PCT <= 0.25 ng/mL                                                 OR                                        > 80% decrease in PCT                                     Discontinue / Do not initiate                                             antibiotics Performed at [facility], [address], [place], [place] [address]   Glucose, capillary     Status: Abnormal   Collection Time: 11/19/19  8:11 AM  Result Value Ref Range   Glucose-Capillary 162 (H) 70 - 99 mg/dL    Comment: Glucose reference range applies only to samples taken after fasting for at least 8 hours.  Glucose, capillary     Status: Abnormal   Collection Time: 11/19/19 11:54 AM  Result Value Ref Range   Glucose-Capillary 202 (H) 70 - 99 mg/dL    Comment: Glucose  reference range applies only to samples taken after fasting for at least 8 hours.  Glucose, capillary     Status: Abnormal   Collection Time: 11/19/19  4:03 PM  Result Value Ref Range   Glucose-Capillary 287 (H) 70 - 99 mg/dL    Comment: Glucose reference range applies only to samples taken after fasting for at least 8 hours.    Medications:  Current Facility-Administered Medications  Medication Dose Route Frequency Provider Last Rate Last Admin  . albuterol (VENTOLIN HFA) 108 (90 Base) MCG/ACT inhaler 2 puff  2 puff Inhalation Q2200 Thurnell Lose, MD   2 puff at 11/18/19 2113  . amiodarone (NEXTERONE PREMIX) 360-4.14 MG/200ML-% (1.8 mg/mL) IV infusion  60 mg/hr Intravenous Continuous Thurnell Lose, MD 33.3  mL/hr at 11/19/19 1416 60 mg/hr at 11/19/19 1416   Followed by  . amiodarone (NEXTERONE PREMIX) 360-4.14 MG/200ML-% (1.8 mg/mL) IV infusion  30 mg/hr Intravenous Continuous Thurnell Lose, MD      . aspirin EC tablet 81 mg  81 mg Oral Daily Rise Patience, MD   81 mg at 11/19/19 0825  . atorvastatin (LIPITOR) tablet 20 mg  20 mg Oral QHS Rise Patience, MD   20 mg at 11/18/19 2107  . benztropine (COGENTIN) tablet 0.5 mg  0.5 mg Oral BID Rise Patience, MD   0.5 mg at 11/19/19 0825  . Chlorhexidine Gluconate Cloth 2 % PADS 6 each  6 each Topical Daily Thurnell Lose, MD   6 each at 11/19/19 1240  . clonazePAM (KLONOPIN) tablet 0.5 mg  0.5 mg Oral BID Rise Patience, MD   0.5 mg at 11/19/19 0824  . dextrose 5 % solution   Intravenous Continuous Thurnell Lose, MD 100 mL/hr at 11/19/19 1240 New Bag at 11/19/19 1240  . ferrous sulfate tablet 325 mg  325 mg Oral BID WC Rise Patience, MD   325 mg at 11/19/19 0824  . fluticasone furoate-vilanterol (BREO ELLIPTA) 200-25 MCG/INH 1 puff  1 puff Inhalation Daily Rise Patience, MD   1 puff at 11/19/19 0906  . heparin injection 5,000 Units  5,000 Units Subcutaneous Q8H Thurnell Lose, MD   5,000  Units at 11/19/19 1406  . insulin aspart (novoLOG) injection 0-6 Units  0-6 Units Subcutaneous TID WC Rise Patience, MD   2 Units at 11/19/19 1203  . lidocaine (XYLOCAINE) 2 % jelly 1 application  1 application Urethral Once Blount, Scarlette Shorts T, NP      . ondansetron (ZOFRAN) injection 4 mg  4 mg Intravenous Q6H PRN Rise Patience, MD      . pantoprazole (PROTONIX) EC tablet 40 mg  40 mg Oral Daily Rise Patience, MD   40 mg at 11/19/19 0825  . potassium chloride SA (KLOR-CON) CR tablet 20 mEq  20 mEq Oral Daily Thurnell Lose, MD   20 mEq at 11/19/19 0826  . tamsulosin (FLOMAX) capsule 0.4 mg  0.4 mg Oral Daily Rise Patience, MD   0.4 mg at 11/19/19 0825  . umeclidinium bromide (INCRUSE ELLIPTA) 62.5 MCG/INH 1 puff  1 puff Inhalation Daily Rise Patience, MD   1 puff at 11/19/19 0907  . vitamin B-12 (CYANOCOBALAMIN) tablet 500 mcg  500 mcg Oral BID Rise Patience, MD   500 mcg at 11/19/19 9379  . zolpidem (AMBIEN) tablet 5 mg  5 mg Oral QHS PRN Thurnell Lose, MD   5 mg at 11/18/19 2107    Musculoskeletal: Strength & Muscle Tone: decreased Gait & Station: unable to assess Patient leans: N/A  Psychiatric Specialty Exam: Physical Exam  Nursing note and vitals reviewed. Constitutional: He appears well-developed.  HENT:  Head: Normocephalic.  Cardiovascular: Normal rate.  Neurological: He is alert.    Review of Systems  Constitutional: Negative.   HENT: Negative.   Eyes: Negative.   Respiratory: Negative.   Cardiovascular: Negative.   Gastrointestinal: Negative.   Genitourinary: Negative.   Musculoskeletal: Negative.   Skin: Negative.   Neurological: Negative.     Blood pressure 101/70, pulse 86, temperature (!) 97.3 F (36.3 C), temperature source Axillary, resp. rate 17, height 5\' 8"  (1.727 m), weight 69.4 kg, SpO2 98 %.Body mass index is 23.26 kg/m.  General Appearance:  Fairly Groomed  Eye Contact:  None  Speech:  no verbal response   Volume:  Decreased  Mood:  unable to assess  Affect:  unable to assess  Thought Process:  NA  Orientation:  Other:  self only  Thought Content:  unable to assess  Suicidal Thoughts:  unable to assess  Homicidal Thoughts:  unable to assess  Memory:  unable to assess  Judgement:  Other:  unable to assess  Insight:  unable to assess  Psychomotor Activity:  unable to assess  Concentration:  Concentration: unable to assess  Recall:  unable to assess  Fund of Knowledge:  unable to assess  Language:  unable to assess  Akathisia:  unable to assess  Handed:  Right  AIMS (if indicated):     Assets:  Financial Resources/Insurance Housing  ADL's:  Impaired  Cognition:  Impaired,  Moderate  Sleep:        Treatment Plan Summary: Patient is a 79 year old male currently alert to self only.  Patient has his mental health history including schizophrenia, anxiety and depression.  Patient currently managed by act team, current medications include Invega Sustenna 156 mg IM every 30 days and Klonopin 0.5 mg twice daily as well as Ambien 5 mg nightly as needed and Cogentin 0.5 mg twice daily. Based on my assessment to day, patient does not meet criteria for inpatient psychiatric treatment.    Recommendations: Medication management recommend consider discontinue Cogentin as its anticholinergic properties may contribute to confusion in older adults.  Recommend consider decrease Ambien from 5 mg nightly as needed to 2.5 mg nightly as needed  Disposition: No evidence of imminent risk to self or others at present.   Patient does not meet criteria for psychiatric inpatient admission.  This service was provided via telemedicine using a 2-way, interactive audio and video technology.  Names of all persons participating in this telemedicine service and their role in this encounter. Name: Katrina Brosh Role: Patient  Name: Letitia Libra Role: FNP  Name: Dr. Dwyane Dee Role: Psychiatrist    Emmaline Kluver,  FNP 11/19/2019 4:06 PM

## 2019-11-19 NOTE — Progress Notes (Addendum)
Occupational Therapy Treatment Patient Details Name: Robert Little MRN: [id number] DOB: 10-03-1940 Today's Date: 11/19/2019    History of present illness 79 y.o. male with medical history significant for chronic anemia, pulmonary fibrosis, hypertension, coronary artery disease, chronic diastolic CHF, and schizoaffective disorder, presenting to the emergency department with progressive shortness of breath long with severe orthopnea starting 1 to 2 days prior to hospital visit, in the hospital work-up suggestive of CHF and acute hypoxic respiratory failure and he was admitted 09/16/19   OT comments  Patient supine in bed with mitten restraints applied.  Patient difficult to arouse.  Once awake he was very lethargic and was not responding verbally or with gestures.  He was following simple commands inconsistently.  Patient required max assist to get to EOB and max assist to maintain seated balance.  Unsure if this is due to medications or decline in status, if so, significant decline since last visit.  Patient was chatty and able to stand and take steps at last visit.  Will continue to follow and monitor progress.  Patient on 2L this session with SpO2 99 at rest and drop to 90 with sit at EOB.   Follow Up Recommendations  SNF;Supervision/Assistance - 24 hour    Equipment Recommendations  None recommended by OT    Recommendations for Other Services      Precautions / Restrictions Precautions Precautions: Fall;Other (comment) Precaution Comments: monitor O2 Restrictions Weight Bearing Restrictions: No       Mobility Bed Mobility Overal bed mobility: Needs Assistance Bed Mobility: Supine to Sit     Supine to sit: Max assist Sit to supine: Max assist   General bed mobility comments: Patient   Transfers Overall transfer level: Needs assistance               General transfer comment: Did not attempt    Balance Overall balance assessment: Needs assistance Sitting-balance  support: Bilateral upper extremity supported;Feet supported Sitting balance-Leahy Scale: Poor   Postural control: Posterior lean;Right lateral lean                                 ADL either performed or assessed with clinical judgement   ADL Overall ADL's : Needs assistance/impaired                     Lower Body Dressing: Total assistance               Functional mobility during ADLs: Maximal assistance General ADL Comments: Patient not safe to stand or progress.       Vision       Perception     Praxis      Cognition Arousal/Alertness: Lethargic Behavior During Therapy: Agitated;Flat affect Overall Cognitive Status: History of cognitive impairments - at baseline                                 General Comments: Patient not verbally responsive today.  In mitten restraints on arrival. Was not following simple commands.  Unsure if this is due to medications or if significant decline in status.        Exercises Exercises: Other exercises Other Exercises Other Exercises: EOB sit 3 min   Shoulder Instructions       General Comments Patient seems notably declined since previous visit.    Pertinent Vitals/ Pain  Pain Assessment: Faces Faces Pain Scale: Hurts a little bit Pain Location: General Pain Descriptors / Indicators: Discomfort Pain Intervention(s): Monitored during session  Home Living                                          Prior Functioning/Environment              Frequency  Min 2X/week        Progress Toward Goals  OT Goals(current goals can now be found in the care plan section)  Progress towards OT goals: Not progressing toward goals - comment(Decline)  Acute Rehab OT Goals Patient Stated Goal: Not stated OT Goal Formulation: Patient unable to participate in goal setting Time For Goal Achievement: 12/03/19 Potential to Achieve Goals: [place] Discharge plan needs  to be updated    Co-evaluation                 AM-PAC OT "6 Clicks" Daily Activity     Outcome Measure   Help from another person eating meals?: A Lot Help from another person taking care of personal grooming?: A Lot Help from another person toileting, which includes using toliet, bedpan, or urinal?: Total Help from another person bathing (including washing, rinsing, drying)?: Total Help from another person to put on and taking off regular upper body clothing?: A Lot Help from another person to put on and taking off regular lower body clothing?: Total 6 Click Score: 9    End of Session Equipment Utilized During Treatment: Oxygen  OT Visit Diagnosis: Unsteadiness on feet (R26.81);Muscle weakness (generalized) (M62.81);Other symptoms and signs involving cognitive function   Activity Tolerance Patient limited by lethargy   Patient Left in bed;with bed alarm set;with call bell/phone within reach;with restraints reapplied   Nurse Communication Mobility status        Time: 7001-7494 OT Time Calculation (min): 17 min  Charges: OT General Charges $OT Visit: 1 Visit OT Treatments $Therapeutic Activity: 8-22 mins  Robert Little, OTR/L    Robert Little 11/19/2019, 1:59 PM

## 2019-11-19 NOTE — TOC Progression Note (Signed)
Transition of Care [facility]) - Progression Note    Patient Details  Name: Robert Little MRN: [id number] Date of Birth: 29-Jul-1940  Transition of Care [facility]) CM/SW [place], LCSW Phone Number: 11/19/2019, 8:31 AM  Clinical Narrative:    Patient still awaiting pasrr evaluation for SNF. Case is assigned to B Akins.    Expected Discharge Plan: Skilled Nursing Facility Barriers to Discharge: [place] [facility])  Expected Discharge Plan and Services Expected Discharge Plan: [place] In-house Referral: Clinical Social Work   Post Acute Care Choice: [place] Living arrangements for the past 2 months: Group Home Expected Discharge Date: 11/16/19                                     Social Determinants of Health (SDOH) Interventions    Readmission Risk Interventions Readmission Risk Prevention Plan 09/20/2019  Transportation Screening Complete  PCP or Specialist Appt within 3-5 Days Complete  HRI or [place] Complete  Social Work Consult for [place] Planning/Counseling Complete  Palliative Care Screening Not Applicable  Medication Review Press photographer) Complete

## 2019-11-20 ENCOUNTER — Inpatient Hospital Stay (HOSPITAL_COMMUNITY): Payer: Medicare Other

## 2019-11-20 LAB — C-REACTIVE PROTEIN: CRP: 5.2 mg/dL — ABNORMAL HIGH (ref <1.0)

## 2019-11-20 LAB — CBC WITH DIFFERENTIAL/PLATELET
Abs Immature Granulocytes: 0.81 10*3/uL — ABNORMAL HIGH (ref 0.00–0.07)
Basophils Absolute: 0 10*3/uL (ref 0.0–0.1)
Basophils Relative: 0 %
Eosinophils Absolute: 0.1 10*3/uL (ref 0.0–0.5)
Eosinophils Relative: 1 %
HCT: 29.2 % — ABNORMAL LOW (ref 39.0–52.0)
Hemoglobin: 9 g/dL — ABNORMAL LOW (ref 13.0–17.0)
Immature Granulocytes: 7 %
Lymphocytes Relative: 13 %
Lymphs Abs: 1.5 10*3/uL (ref 0.7–4.0)
MCH: 28.6 pg (ref 26.0–34.0)
MCHC: 30.8 g/dL (ref 30.0–36.0)
MCV: 92.7 fL (ref 80.0–100.0)
Monocytes Absolute: 0.9 10*3/uL (ref 0.1–1.0)
Monocytes Relative: 7 %
Neutro Abs: 8.5 10*3/uL — ABNORMAL HIGH (ref 1.7–7.7)
Neutrophils Relative %: 72 %
Platelets: UNDETERMINED 10*3/uL (ref 150–400)
RBC: 3.15 MIL/uL — ABNORMAL LOW (ref 4.22–5.81)
RDW: 16.5 % — ABNORMAL HIGH (ref 11.5–15.5)
WBC: 11.8 10*3/uL — ABNORMAL HIGH (ref 4.0–10.5)
nRBC: 0.5 % — ABNORMAL HIGH (ref 0.0–0.2)

## 2019-11-20 LAB — COMPREHENSIVE METABOLIC PANEL
ALT: 25 U/L (ref 0–44)
AST: 32 U/L (ref 15–41)
Albumin: 3 g/dL — ABNORMAL LOW (ref 3.5–5.0)
Alkaline Phosphatase: 80 U/L (ref 38–126)
Anion gap: 12 (ref 5–15)
BUN: 89 mg/dL — ABNORMAL HIGH (ref 8–23)
CO2: 20 mmol/L — ABNORMAL LOW (ref 22–32)
Calcium: 9 mg/dL (ref 8.9–10.3)
Chloride: 114 mmol/L — ABNORMAL HIGH (ref 98–111)
Creatinine, Ser: 3.06 mg/dL — ABNORMAL HIGH (ref 0.61–1.24)
GFR calc Af Amer: 22 mL/min — ABNORMAL LOW (ref >60)
GFR calc non Af Amer: 19 mL/min — ABNORMAL LOW (ref >60)
Glucose, Bld: 181 mg/dL — ABNORMAL HIGH (ref 70–99)
Potassium: 5.4 mmol/L — ABNORMAL HIGH (ref 3.5–5.1)
Sodium: 146 mmol/L — ABNORMAL HIGH (ref 135–145)
Total Bilirubin: 1.4 mg/dL — ABNORMAL HIGH (ref 0.3–1.2)
Total Protein: 6.3 g/dL — ABNORMAL LOW (ref 6.5–8.1)

## 2019-11-20 LAB — GLUCOSE, CAPILLARY
Glucose-Capillary: 174 mg/dL — ABNORMAL HIGH (ref 70–99)
Glucose-Capillary: 295 mg/dL — ABNORMAL HIGH (ref 70–99)
Glucose-Capillary: 267 mg/dL — ABNORMAL HIGH (ref 70–99)
Glucose-Capillary: 273 mg/dL — ABNORMAL HIGH (ref 70–99)

## 2019-11-20 LAB — BRAIN NATRIURETIC PEPTIDE: B Natriuretic Peptide: 232.8 pg/mL — ABNORMAL HIGH (ref 0.0–100.0)

## 2019-11-20 LAB — PROCALCITONIN: Procalcitonin: 0.12 ng/mL

## 2019-11-20 LAB — D-DIMER, QUANTITATIVE: D-Dimer, Quant: 1.11 ug/mL-FEU — ABNORMAL HIGH (ref 0.00–0.50)

## 2019-11-20 MED ORDER — LACTATED RINGERS IV BOLUS
500.0000 mL | Freq: Once | INTRAVENOUS | Status: AC
  Administered 2019-11-20: 500 mL via INTRAVENOUS

## 2019-11-20 MED ORDER — SODIUM POLYSTYRENE SULFONATE 15 GM/60ML PO SUSP
30.0000 g | Freq: Once | ORAL | Status: AC
  Administered 2019-11-20: 30 g via ORAL
  Filled 2019-11-20 (×2): qty 120

## 2019-11-20 MED ORDER — AMIODARONE HCL 200 MG PO TABS
200.0000 mg | ORAL_TABLET | Freq: Every day | ORAL | Status: DC
  Administered 2019-11-20 – 2019-11-30 (×11): 200 mg via ORAL
  Filled 2019-11-20 (×11): qty 1

## 2019-11-20 MED ORDER — RESOURCE THICKENUP CLEAR PO POWD
ORAL | Status: DC | PRN
  Filled 2019-11-20: qty 125

## 2019-11-20 MED ORDER — DEXTROSE 5 % IV SOLN: INTRAVENOUS | Status: AC

## 2019-11-20 MED ORDER — SODIUM CHLORIDE 0.9 % IV BOLUS
500.0000 mL | Freq: Once | INTRAVENOUS | Status: AC
  Administered 2019-11-20: 500 mL via INTRAVENOUS

## 2019-11-20 MED ORDER — METOPROLOL TARTRATE 25 MG PO TABS
25.0000 mg | ORAL_TABLET | Freq: Two times a day (BID) | ORAL | Status: DC
  Administered 2019-11-20 (×2): 25 mg via ORAL
  Filled 2019-11-20 (×3): qty 1

## 2019-11-20 MED ORDER — QUETIAPINE FUMARATE 25 MG PO TABS
25.0000 mg | ORAL_TABLET | Freq: Every day | ORAL | Status: DC
  Administered 2019-11-20: 25 mg via ORAL
  Filled 2019-11-20: qty 1

## 2019-11-20 MED ORDER — SODIUM ZIRCONIUM CYCLOSILICATE 5 G PO PACK
5.0000 g | PACK | Freq: Once | ORAL | Status: AC
  Administered 2019-11-20: 5 g via ORAL
  Filled 2019-11-20 (×2): qty 1

## 2019-11-20 MED ORDER — ALPRAZOLAM 0.5 MG PO TABS
0.5000 mg | ORAL_TABLET | Freq: Two times a day (BID) | ORAL | Status: DC | PRN
  Administered 2019-11-20 – 2019-11-22 (×3): 0.5 mg via ORAL
  Filled 2019-11-20 (×3): qty 1

## 2019-11-20 NOTE — TOC Progression Note (Signed)
Transition of Care [facility]) - Progression Note    Patient Details  Name: Jahlen Bollman MRN: [id number] Date of Birth: 08-15-1940  Transition of Care [facility]) CM/SW [place], LCSW Phone Number: 11/20/2019, 10:56 AM  Clinical Narrative:    CSW completed phone assessment with pasrr RN, B. Akins, as patient is unable to participate.    Expected Discharge Plan: Skilled Nursing Facility Barriers to Discharge: [place] [facility])  Expected Discharge Plan and Services Expected Discharge Plan: [place] In-house Referral: Clinical Social Work   Post Acute Care Choice: [place] Living arrangements for the past 2 months: Group Home Expected Discharge Date: 11/16/19                                     Social Determinants of Health (SDOH) Interventions    Readmission Risk Interventions Readmission Risk Prevention Plan 09/20/2019  Transportation Screening Complete  PCP or Specialist Appt within 3-5 Days Complete  HRI or [place] Complete  Social Work Consult for [place] Planning/Counseling Complete  Palliative Care Screening Not Applicable  Medication Review [employer]) Complete

## 2019-11-20 NOTE — Progress Notes (Signed)
Physical Therapy Treatment Patient Details Name: Robert Little MRN: [id number] DOB: [birth year] Today's Date: 11/20/2019    History of Present Illness 79 y.o. male with medical history significant for chronic anemia, pulmonary fibrosis, hypertension, coronary artery disease, chronic diastolic CHF, and schizoaffective disorder, presenting to the emergency department with progressive shortness of breath long with severe orthopnea starting 1 to 2 days prior to hospital visit, in the hospital work-up suggestive of CHF and acute hypoxic respiratory failure and he was admitted 09/16/19    PT Comments    Pt's cognitive status is declining, he is restless, impulsive, unaware of where he is, not speaking in full sentences anymore.   His RR rate is in the mid 30s at rest despite O2 sats in the 90s on 3 L O2 [place] and he has a productive cough.  He is now requiring max assist for all mobility and is unable to stand to RW (he was able to stand to the RW on Friday and transfer OOB to chair with the walker and min assist.  He is taking a physical and cognitive decline since I last saw him.  PT will continue to follow acutely for safe mobility progression.  Follow Up Recommendations  SNF     Equipment Recommendations  Wheelchair (measurements PT);Wheelchair cushion (measurements PT);Hospital bed    Recommendations for Other Services   NA     Precautions / Restrictions Precautions Precautions: Fall;Other (comment) Precaution Comments: monitor O2    Mobility  Bed Mobility Overal bed mobility: Needs Assistance Bed Mobility: Supine to Sit;Sit to Supine     Supine to sit: Max assist;HOB elevated Sit to supine: Max assist;HOB elevated   General bed mobility comments: Max assist to come to sitting EOB, max assist to help return to be and reposition.  Pt not initiating movement to EOB.   Transfers Overall transfer level: Needs assistance Equipment used: Rolling walker (2 wheeled) Transfers: Sit to/from  Stand Sit to Stand: Max assist;From elevated surface         General transfer comment: Attempted to stand from elevated bed to RW with max assist and only able to lift bottom breifly up off of bed.          Balance Overall balance assessment: Needs assistance Sitting-balance support: Feet supported;Bilateral upper extremity supported;No upper extremity supported Sitting balance-Leahy Scale: Zero Sitting balance - Comments: up to max assist for sitting balance EOB, worked up to min guard to close supervision once scooted forward with feet on the ground and bil hands manually propped.   Postural control: Posterior lean Standing balance support: Bilateral upper extremity supported Standing balance-Leahy Scale: Zero Standing balance comment: unable to get to standing with assist. today                            Cognition Arousal/Alertness: Awake/alert Behavior During Therapy: Restless Overall Cognitive Status: History of cognitive impairments - at baseline                                 General Comments: Pt's cognition is worse today, restless throughout the day, attempting to climb out of the bed, not speaking much more than grunts and simple one word answers (yeah), was speaking in sentances, alert and interactive, only attempted to get up when asked to move.          General Comments General comments (skin integrity, edema,  etc.): VSS on 3 L O2 [place] with sats in the 90s, however, his RR was 35 during mobility and he seemed to be working hard to breathe.        Pertinent Vitals/Pain Pain Assessment: Faces Pain Score: 0-No pain Faces Pain Scale: Hurts a little bit Pain Intervention(s): Monitored during session           PT Goals (current goals can now be found in the care plan section) Acute Rehab PT Goals Patient Stated Goal: Not stated Progress towards PT goals: Not progressing toward goals - comment    Frequency    Min 2X/week       PT Plan Current plan remains appropriate       AM-PAC PT "6 Clicks" Mobility   Outcome Measure  Help needed turning from your back to your side while in a flat bed without using bedrails?: Total Help needed moving from lying on your back to sitting on the side of a flat bed without using bedrails?: Total Help needed moving to and from a bed to a chair (including a wheelchair)?: Total Help needed standing up from a chair using your arms (e.g., wheelchair or bedside chair)?: Total Help needed to walk in hospital room?: Total Help needed climbing 3-5 steps with a railing? : Total 6 Click Score: 6    End of Session Equipment Utilized During Treatment: Oxygen Activity Tolerance: Other (comment)(limited by decreased cognition) Patient left: in bed;with call bell/phone within reach;with bed alarm set   PT Visit Diagnosis: Muscle weakness (generalized) (M62.81);Difficulty in walking, not elsewhere classified (R26.2)     Time: 9741-6384 PT Time Calculation (min) (ACUTE ONLY): 15 min  Charges:  $Therapeutic Activity: 8-22 mins           Verdene Lennert, PT, DPT  Acute Rehabilitation [phone] pager #[phone] office              11/20/2019, 5:32 PM

## 2019-11-20 NOTE — Progress Notes (Signed)
PROGRESS NOTE                                                                                                                                                                                                             Patient Demographics:    Robert Little, is a 79 y.o. male, DOB - January 15, 1941, ZOX:[id number]  Outpatient Primary MD for the patient is Robert Little    LOS - 8  Admit date - 11/12/2019    Chief Complaint  Patient presents with   Shortness of Breath       Brief Narrative -  Robert Little is a 79 y.o. male with history of his affective disorder, CAD, pulmonary fibrosis on home oxygen usually 2 L anemia hypertension diabetes mellitus recently admitted for hypoglycemia with use of sulfonylureas in the setting of chronic kidney disease was brought to the ER after patient has been having productive cough over the last 3 days feeling weak and tired not eating well.  ER he was diagnosed with acute hypoxic respiratory failure due to COVID-19 pneumonia, dehydration AKI and NSTEMI.  Was complicated by runs of A. fib with RVR, dehydration, hypotension and AKI.   Subjective:   Patient in bed, appears comfortable, denies any headache, no fever, no chest pain or pressure, no shortness of breath , no abdominal pain. No focal weakness.   Assessment  & Plan :    1. Sepsis with Acute Hypoxic Resp. Failure due to Acute Covid 19 Viral Pneumonitis during the ongoing 2020 Covid 19 Pandemic - he has moderate disease has been started on IV steroids and remdesivir, will monitor closely.  Poor candidate for Actemra due to underlying frail status.  Sepsis pathophysiology seems to have resolved.  Note he uses 2 L nasal cannula oxygen at baseline.  Encouraged the patient to sit up in chair in the daytime use I-S and flutter valve for pulmonary toiletry and then prone in bed when at night.  Will advance activity and titrate  down oxygen as possible.   SpO2: 97 % O2 Flow Rate (L/min): 2 L/min  Recent Labs  Lab 11/14/19 0503 11/15/19 0622 11/16/19 0409 11/19/19 0731 11/20/19 0447  CRP 8.9* 5.3* 4.4* 4.3* 5.2*  DDIMER 1.43* 1.04* 0.87* 1.48* 1.11*  BNP 605.4* 255.2* 234.9* 559.3* 232.8*  PROCALCITON  --   --   --  <  0.10 0.12    Hepatic Function Latest Ref Rng & Units 11/20/2019 11/18/2019 11/16/2019  Total Protein 6.5 - 8.1 g/dL 6.3(L) 7.1 6.2(L)  Albumin 3.5 - 5.0 g/dL 3.0(L) 3.6 3.2(L)  AST 15 - 41 U/L 32 38 32  ALT 0 - 44 U/L _0 Alk Phosphatase 38 - 126 U/L 80 82 63  Total Bilirubin 0.3 - 1.2 mg/dL 1.4(H) 1.3(H) 0.9    2.  Dehydration with AKI on underlying CKD 4 with baseline creatinine of around 2.5. IVF now and monitor, developed Urinary retention, Foley-Flomax.  3.  History of CAD. Not NSTEMI.  Could be due to demand ischemia from hypoxia, continue aspirin, statin and beta-blocker for secondary prevention, stable echocardiogram, stable EKG and chest pain-free seen by cardiology continue present care with outpatient cardiology follow-up post discharge.  4. History of schizoaffective disorder takes monthly injection of Invega and daily on Klonopin and Cogentin. Follows at [facility] follow up.  According to the POA he has been extremely weak for the last 6 months and they were unable to take him to his psychiatrist for follow-up.  5. History of pulmonary fibrosis now on 2 L home oxygen.  Continue supportive care with a liters as needed.  Outpatient pulmonary follow-up.  6.  Anemia of chronic disease.  Stable will monitor.  7.  Chronic diastolic CHF.  EF on recent echocardiogram was 60%.  Appears dehydrated, hold Lasix, LR bolus 500c + D5 W and monitor.  8.  Chronically elevated D-dimers.  Previously has had negative lower extremity venous duplex, currently on heparin drip, will trend and monitor.    9.  Essential hypertension.  Now hypotensive - IVF repeat on 11/20/2019 and monitor,  discontinued Coreg low-dose Lopressor if tolerated.  10.  Paroxysmal atrial fibrillation with [place] vas 2 score of at least 3 with RVR and Hypotension on 11/19/2019.  He was placed briefly on IV amiodarone and digoxin, case discussed with cardiologist Dr. Ellyn Hack, extremely high risk candidate for anticoagulation due to fall risk and frail status, aspirin, rate better will be placed on low-dose Lopressor along with oral amiodarone.  Continue to monitor.  11. DM type II.  For now sliding scale, addedm low-dose Lantus as CBGs are running high due to steroid use.   Lab Results  Component Value Date   HGBA1C 7.0 (H) 09/20/2019   CBG (last 3)  Recent Labs    11/19/19 1603 11/19/19 2042 11/20/19 0823  GLUCAP 287* 289* 174*     Condition - Extremely Guarded  Family Communication  :  DW POA - Mr Robert Little about guarded his poor prognosis.  Mr. Robert Little states that for the last 6 months he has had multiple hospitalizations and he is getting so weak that he was not able to see his psychiatrist for the last 6 months as well, however he somehow has some unrealistic expectations of outcome and was told that this to Robert Little is a 80 year old gentleman with multiple medical problems and psychiatric problems and now has moderate to severe Covid infection with complications arising from it which provides a poor and guarded prognosis.  Code Status :  DNR  Consults  :  Cards, Psych  Disposition Plan  :    Status is: Inpatient  Remains inpatient appropriate because:Inpatient level of care appropriate due to severity of illness  Dispo: The patient is from: Group home              Anticipated d/c is to: Group  home              Anticipated d/c date is: 3 days              Patient currently is not medically stable to d/c. Covid, Afib RVR, AKI     Procedures  :    TTE - 1. Left ventricular ejection fraction, by estimation, is 60 to 65%. The left ventricle has normal function. The left ventricle  has no regional wall motion abnormalities. There is mild concentric left ventricular hypertrophy. Left ventricular diastolic parameters are consistent with Grade II diastolic dysfunction  (pseudonormalization). Elevated left atrial pressure.  2. Right ventricular systolic function is normal. The right ventricular size is normal. There is normal pulmonary artery systolic pressure.  3. Left atrial size was mildly dilated.  4. The mitral valve is normal in structure. Mild mitral valve regurgitation. No evidence of mitral stenosis.  5. The aortic valve is normal in structure. Aortic valve regurgitation is not visualized. No aortic stenosis is present.  6. The inferior vena cava is normal in size with greater than 50% respiratory variability, suggesting right atrial pressure of 3 mmHg.   PUD Prophylaxis :  None  DVT Prophylaxis  :   Heparin   Lab Results  Component Value Date   PLT PLATELET CLUMPS NOTED ON SMEAR, UNABLE TO ESTIMATE 11/20/2019    Diet :  Diet Order            Diet heart healthy/carb modified Room service appropriate? Yes; Fluid consistency: Thin  Diet effective now               Inpatient Medications  Scheduled Meds:  albuterol  2 puff Inhalation Q2200   amiodarone  200 mg Oral Daily   aspirin EC  81 mg Oral Daily   atorvastatin  20 mg Oral QHS   benztropine  0.5 mg Oral BID   Chlorhexidine Gluconate Cloth  6 each Topical Daily   clonazePAM  0.5 mg Oral BID   ferrous sulfate  325 mg Oral BID WC   fluticasone furoate-vilanterol  1 puff Inhalation Daily   heparin injection (subcutaneous)  5,000 Units Subcutaneous Q8H   insulin aspart  0-6 Units Subcutaneous TID WC   lidocaine  1 application Urethral Once   metoprolol tartrate  25 mg Oral BID   pantoprazole  40 mg Oral Daily   tamsulosin  0.4 mg Oral Daily   umeclidinium bromide  1 puff Inhalation Daily   vitamin B-12  500 mcg Oral BID   Continuous Infusions:  dextrose 100 mL/hr at  11/20/19 0919   PRN Meds:.[DISCONTINUED] ondansetron **OR** ondansetron (ZOFRAN) IV, zolpidem  Antibiotics  :    Anti-infectives (From admission, onward)   Start     Dose/Rate Route Frequency Ordered Stop   11/13/19 1600  remdesivir 100 mg in sodium chloride 0.9 % 100 mL IVPB     100 mg 200 mL/hr over 30 Minutes Intravenous Daily 11/12/19 2146 11/16/19 1700   11/12/19 2300  remdesivir 200 mg in sodium chloride 0.9% 250 mL IVPB     200 mg 580 mL/hr over 30 Minutes Intravenous Once 11/12/19 2146 11/13/19 0104   11/12/19 2130  cefTRIAXone (ROCEPHIN) 2 g in sodium chloride 0.9 % 100 mL IVPB  Status:  Discontinued     2 g 200 mL/hr over 30 Minutes Intravenous  Once 11/12/19 2056 11/12/19 2125   11/12/19 2130  azithromycin (ZITHROMAX) 500 mg in sodium chloride 0.9 % 250 mL IVPB  Status:  Discontinued     500 mg 250 mL/hr over 60 Minutes Intravenous  Once 11/12/19 2056 11/12/19 2125       Time Spent in minutes  30   Lala Lund M.D on 11/20/2019 at 10:15 AM  To page go to [email or web address] - password [facility]  [facility] -  Office  [phone]  See all Orders from today for further details    Objective:   Vitals:   11/19/19 1954 11/20/19 0405 11/20/19 0605 11/20/19 0800  BP:  (!) 82/40  (!) 87/66  Pulse:   82 88  Resp: (!) 22 (!) 25  (!) 23  Temp:  (!) 97.5 F (36.4 C) 97.6 F (36.4 C) 98 F (36.7 C)  TempSrc:  Axillary Axillary Axillary  SpO2:  96% 97% 97%  Weight:      Height:        Wt Readings from Last 3 Encounters:  11/13/19 69.4 kg  10/28/19 69.4 kg  10/10/19 69.4 kg     Intake/Output Summary (Last 24 hours) at 11/20/2019 1015 Last data filed at 11/20/2019 0603 Gross per 24 hour  Intake 968.67 ml  Output 1655 ml  Net -686.33 ml     Physical Exam  Awake minimally confused at times, No new F.N deficits,  [place].AT,PERRAL Supple Neck,No JVD, No cervical lymphadenopathy appriciated.  Symmetrical Chest wall movement, Good air movement bilaterally,  CTAB RRR,No Gallops, Rubs or new Murmurs, No Parasternal Heave +ve B.Sounds, Abd Soft, No tenderness, No organomegaly appriciated, No rebound - guarding or rigidity. No Cyanosis, Clubbing or edema, No new Rash or bruise     Data Review:    CBC Recent Labs  Lab 11/14/19 0503 11/14/19 0503 11/15/19 0622 11/16/19 0409 11/18/19 0322 11/19/19 0731 11/20/19 0447  WBC 5.0   < > 11.0* 10.7* 17.2* 14.2* 11.8*  HGB 9.4*   < > 8.8* 8.7* 9.5* 9.1* 9.0*  HCT 29.0*   < > 27.5* 26.7* 28.8* 28.3* 29.2*  PLT 212   < > 266 258 361 324 PLATELET CLUMPS NOTED ON SMEAR, UNABLE TO ESTIMATE  MCV 90.6   < > 89.9 89.0 89.7 90.1 92.7  MCH 29.4   < > 28.8 29.0 29.6 29.0 28.6  MCHC 32.4   < > 32.0 32.6 33.0 32.2 30.8  RDW 15.6*   < > 15.8* 15.6* 15.6* 16.1* 16.5*  LYMPHSABS 0.9  --  1.1 1.0 2.0  --  1.5  MONOABS 0.3  --  0.5 0.6 1.1*  --  0.9  EOSABS 0.0  --  0.0 0.0 0.0  --  0.1  BASOSABS 0.0  --  0.0 0.0 0.1  --  0.0   < > = values in this interval not displayed.    Chemistries  Recent Labs  Lab 11/14/19 0503 11/14/19 0503 11/15/19 0622 11/16/19 0409 11/18/19 0322 11/19/19 0731 11/20/19 0447  NA 139   < > 142 142 142 147* 146*  K 4.1   < > 4.1 4.5 4.4 5.0 5.4*  CL 112*   < > 111 108 105 114* 114*  CO2 18*   < > 20* 23 21* 18* 20*  GLUCOSE 205*   < > 167* 179* 166* 183* 181*  BUN 52*   < > 53* 58* 93* 106* 89*  CREATININE 2.32*   < > 2.08* 2.14* 3.22* 3.59* 3.06*  CALCIUM 7.4*   < > 8.2* 8.9 9.5 9.0 9.0  AST 39  --  34 32 38  --  32  ALT 18  --  _0 --  25  ALKPHOS 63  --  63 63 82  --  80  BILITOT 0.4  --  0.7 0.9 1.3*  --  1.4*  MG 1.9  --  1.7 1.7  --  1.9  --    < > = values in this interval not displayed.     ------------------------------------------------------------------------------------------------------------------ No results for input(s): CHOL, HDL, LDLCALC, TRIG, CHOLHDL, LDLDIRECT in the last 72 hours.  Lab Results  Component Value Date   HGBA1C 7.0 (H)  09/20/2019   ------------------------------------------------------------------------------------------------------------------ No results for input(s): TSH, T4TOTAL, T3FREE, THYROIDAB in the last 72 hours.  Invalid input(s): FREET3  Cardiac Enzymes No results for input(s): CKMB, TROPONINI, MYOGLOBIN in the last 168 hours.  Invalid input(s): CK ------------------------------------------------------------------------------------------------------------------    Component Value Date/Time   BNP 232.8 (H) 11/20/2019 0447    Micro Results Recent Results (from the past 240 hour(s))  SARS Coronavirus 2 by RT PCR (hospital order, performed in [facility] hospital lab) Nasopharyngeal Nasopharyngeal Swab     Status: Abnormal   Collection Time: 11/12/19  7:16 PM   Specimen: Nasopharyngeal Swab  Result Value Ref Range Status   SARS Coronavirus 2 POSITIVE (A) NEGATIVE Final    Comment: RESULT CALLED TO, READ BACK BY AND VERIFIED WITH: T KELLY RN 11/12/19 2119 JDW (NOTE) SARS-CoV-2 target nucleic acids are DETECTED SARS-CoV-2 RNA is generally detectable in upper respiratory specimens  during the acute phase of infection.  Positive results are indicative  of the presence of the identified virus, but do not rule out bacterial infection or co-infection with other pathogens not detected by the test.  Clinical correlation with patient history and  other diagnostic information is necessary to determine patient infection status.  The expected result is negative. Fact Sheet for Patients:   StrictlyIdeas.no  Fact Sheet for Healthcare Providers:   BankingDealers.co.za   This test is not yet approved or cleared by the [place] FDA and  has been authorized for detection and/or diagnosis of SARS-CoV-2 by FDA under an Emergency Use Authorization (EUA).  This EUA will remain in effect (meaning this test can be used)  for the duration of  the COVID-19  declaration under Section 564(b)(1) of the Act, 21 U.S.C. section 360-bbb-3(b)(1), unless the authorization is terminated or revoked sooner. Performed at [facility], [place] [address] [place], [place] [address]   Blood Culture (routine x 2)     Status: None   Collection Time: 11/12/19  7:16 PM   Specimen: BLOOD  Result Value Ref Range Status   Specimen Description BLOOD LEFT FOREARM  Final   Special Requests   Final    BOTTLES DRAWN AEROBIC AND ANAEROBIC Blood Culture results may not be optimal due to an excessive volume of blood received in culture bottles   Culture   Final    NO GROWTH 5 DAYS Performed at [facility], [place] [address] [place], [place] [address]    Report Status 11/17/2019 FINAL  Final  Urine culture     Status: None   Collection Time: 11/12/19  7:16 PM   Specimen: In/Out Cath Urine  Result Value Ref Range Status   Specimen Description IN/OUT CATH URINE  Final   Special Requests NONE  Final   Culture   Final    NO GROWTH Performed at [facility], [place] [address], [place], [place] [address]    Report Status 11/13/2019 FINAL  Final  Blood Culture (routine x 2)     Status: None   Collection Time: 11/12/19  7:21 PM   Specimen: BLOOD  Result Value Ref Range Status   Specimen Description BLOOD RIGHT FOREARM  Final   Special Requests   Final    BOTTLES DRAWN AEROBIC AND ANAEROBIC Blood Culture results may not be optimal due to an inadequate volume of blood received in culture bottles   Culture   Final    NO GROWTH 5 DAYS Performed at [facility], [place] [address] [place], [place] [address]    Report Status 11/17/2019 FINAL  Final    Radiology Reports CT Head Wo Contrast  Result Date: 10/28/2019 CLINICAL DATA:  Un witnessed fall, weakness, confusion EXAM: CT HEAD WITHOUT CONTRAST TECHNIQUE: Contiguous axial images were obtained from the base of the skull through the vertex without intravenous contrast. COMPARISON:  None. FINDINGS: Brain:  Hypodensities in the periventricular white matter, most pronounced in the left frontal region, most likely reflect chronic small vessel ischemic change. No signs of acute infarct or hemorrhage. Lateral ventricles and remaining midline structures are unremarkable. No acute extra-axial fluid collections. No mass effect. Vascular: No hyperdense vessel or unexpected calcification. Skull: Normal. Negative for fracture or focal lesion. Sinuses/Orbits: Mucoperiosteal thickening within the maxillary sinuses, right greater than left. Remaining sinuses are clear. Other: None. IMPRESSION: 1. No acute intracranial trauma. 2. Likely chronic small vessel ischemic changes within the frontal periventricular white matter. Electronically Signed   By: Randa Ngo M.D.   On: 10/28/2019 19:31   DG Chest Port 1 View  Result Date: 11/20/2019 CLINICAL DATA:  Shortness of breath, cough. EXAM: PORTABLE CHEST 1 VIEW COMPARISON:  Nov 19, 2019. FINDINGS: Stable cardiomediastinal silhouette. Sternotomy wires are noted. No pneumothorax or pleural effusion is noted. Stable fibrotic changes noted in the lung bases. No definite acute abnormality is noted. Bony thorax is unremarkable. IMPRESSION: Stable bibasilar fibrotic changes are noted. No definite acute cardiopulmonary abnormality seen. Electronically Signed   By: Marijo Conception M.D.   On: 11/20/2019 08:32   DG Chest Port 1 View  Result Date: 11/19/2019 CLINICAL DATA:  Shortness of breath EXAM: PORTABLE CHEST 1 VIEW COMPARISON:  Nov 12, 2019 chest radiograph and chest CT September 19, 2019 FINDINGS: There is fibrotic change scattered throughout the lungs, most notably in the mid and lower lung regions, essentially stable. No new opacity evident. Heart is upper normal in size with pulmonary vascularity normal. Patient is status post coronary artery bypass grafting. No adenopathy appreciable. There is aortic atherosclerosis. No evident bone lesions. IMPRESSION: Stable fibrotic type change,  most severe in the mid and lower lung regions. No new opacity evident. Stable cardiac silhouette. Postoperative changes noted. Aortic Atherosclerosis (ICD10-I70.0). Electronically Signed   By: Lowella Grip III M.D.   On: 11/19/2019 08:24   DG Chest Port 1 View  Result Date: 11/12/2019 CLINICAL DATA:  Shortness of breath and sepsis. Fever. Productive cough. EXAM: PORTABLE CHEST 1 VIEW COMPARISON:  10/28/2019.  09/19/2019. FINDINGS: The patient has a history of pulmonary fibrosis and emphysema. Chronic lung markings appear similar allowing for technical differences. Upper lobe emphysema as seen previously. No sign of dense consolidation, collapse or effusion. Previous median sternotomy and CABG. Aortic atherosclerosis. No acute bone finding. IMPRESSION: Portable film with the poor inspiration. Chronic fibrotic lung disease and upper lung emphysema. No sign of acute infiltrate, collapse or effusion. Electronically Signed   By: Nelson Chimes M.D.   On: 11/12/2019  19:37   DG Chest Portable 1 View  Result Date: 10/28/2019 CLINICAL DATA:  79 year old male with altered mental status. EXAM: PORTABLE CHEST 1 VIEW COMPARISON:  Chest radiograph dated 09/19/2019 and CT dated 09/19/2019 FINDINGS: Diffuse interstitial scarring in keeping with fibrosis. No new consolidation. There is no pleural effusion or pneumothorax. Stable cardiomediastinal silhouette. Atherosclerotic calcification of the aorta. Median sternotomy wires and CABG vascular clips. IMPRESSION: 1. No acute cardiopulmonary process. 2. Fibrotic changes. Electronically Signed   By: Anner Crete M.D.   On: 10/28/2019 21:16   ECHOCARDIOGRAM COMPLETE  Result Date: 11/14/2019    ECHOCARDIOGRAM REPORT   Patient Name:   RITESH OPARA Date of Exam: 11/14/2019 Medical Rec #:  [id number]    Height:       68.0 in Accession #:    [id number]   Weight:       153.0 lb Date of Birth:  1941/06/03    BSA:          1.824 m Patient Age:    89 years     BP:            131/61 mmHg Patient Gender: M            HR:           58 bpm. Exam Location:  Inpatient Procedure: 2D Echo, Cardiac Doppler and Color Doppler Indications:    Elevated Troponin  History:        Patient has prior history of Echocardiogram examinations, most                 recent 09/06/2019. CHF, CAD; Risk Factors:Hypertension,                 Dyslipidemia and Former Smoker.  Sonographer:    Vickie Epley RDCS Referring Phys: [id number] RHONDA G BARRETT  Sonographer Comments: Covid positive. IMPRESSIONS  1. Left ventricular ejection fraction, by estimation, is 60 to 65%. The left ventricle has normal function. The left ventricle has no regional wall motion abnormalities. There is mild concentric left ventricular hypertrophy. Left ventricular diastolic parameters are consistent with Grade II diastolic dysfunction (pseudonormalization). Elevated left atrial pressure.  2. Right ventricular systolic function is normal. The right ventricular size is normal. There is normal pulmonary artery systolic pressure.  3. Left atrial size was mildly dilated.  4. The mitral valve is normal in structure. Mild mitral valve regurgitation. No evidence of mitral stenosis.  5. The aortic valve is normal in structure. Aortic valve regurgitation is not visualized. No aortic stenosis is present.  6. The inferior vena cava is normal in size with greater than 50% respiratory variability, suggesting right atrial pressure of 3 mmHg. FINDINGS  Left Ventricle: Left ventricular ejection fraction, by estimation, is 60 to 65%. The left ventricle has normal function. The left ventricle has no regional wall motion abnormalities. The left ventricular internal cavity size was normal in size. There is  mild concentric left ventricular hypertrophy. Left ventricular diastolic parameters are consistent with Grade II diastolic dysfunction (pseudonormalization). Elevated left atrial pressure. Right Ventricle: The right ventricular size is normal. No increase in right  ventricular wall thickness. Right ventricular systolic function is normal. There is normal pulmonary artery systolic pressure. The tricuspid regurgitant velocity is 2.25 m/s, and  with an assumed right atrial pressure of 3 mmHg, the estimated right ventricular systolic pressure is 74.0 mmHg. Left Atrium: Left atrial size was mildly dilated. Right Atrium: Right atrial size was normal in size. Pericardium: There is no  evidence of pericardial effusion. Mitral Valve: The mitral valve is normal in structure. Normal mobility of the mitral valve leaflets. Mild mitral valve regurgitation. No evidence of mitral valve stenosis. Tricuspid Valve: The tricuspid valve is normal in structure. Tricuspid valve regurgitation is not demonstrated. No evidence of tricuspid stenosis. Aortic Valve: The aortic valve is normal in structure. Aortic valve regurgitation is not visualized. No aortic stenosis is present. Pulmonic Valve: The pulmonic valve was normal in structure. Pulmonic valve regurgitation is not visualized. No evidence of pulmonic stenosis. Aorta: The aortic root is normal in size and structure. Venous: The inferior vena cava is normal in size with greater than 50% respiratory variability, suggesting right atrial pressure of 3 mmHg. IAS/Shunts: No atrial level shunt detected by color flow Doppler.  LEFT VENTRICLE PLAX 2D LVIDd:         4.70 cm      Diastology LVIDs:         3.42 cm      LV e' lateral:   7.14 cm/s LV PW:         0.80 cm      LV E/e' lateral: 12.3 LV IVS:        0.80 cm      LV e' medial:    4.62 cm/s LVOT diam:     1.90 cm      LV E/e' medial:  19.0 LV SV:         45 LV SV Index:   25 LVOT Area:     2.84 cm  LV Volumes (MOD) LV vol d, MOD A2C: 103.0 ml LV vol d, MOD A4C: 115.0 ml LV vol s, MOD A2C: 61.9 ml LV vol s, MOD A4C: 46.6 ml LV SV MOD A2C:     41.1 ml LV SV MOD A4C:     115.0 ml LV SV MOD BP:      57.4 ml RIGHT VENTRICLE RV S prime:     10.10 cm/s TAPSE (M-mode): 1.6 cm LEFT ATRIUM             Index        RIGHT ATRIUM           Index LA diam:        4.20 cm 2.30 cm/m  RA Area:     10.30 cm LA Vol (A2C):   24.4 ml 13.38 ml/m RA Volume:   18.10 ml  9.92 ml/m LA Vol (A4C):   33.3 ml 18.26 ml/m LA Biplane Vol: 31.4 ml 17.22 ml/m  AORTIC VALVE LVOT Vmax:   70.00 cm/s LVOT Vmean:  40.000 cm/s LVOT VTI:    0.160 m  AORTA Ao Root diam: 3.10 cm MITRAL VALVE               TRICUSPID VALVE MV Area (PHT): 5.02 cm    TR Peak grad:   20.2 mmHg MV Decel Time: 151 msec    TR Vmax:        225.00 cm/s MV E velocity: 87.80 cm/s MV A velocity: 70.30 cm/s  SHUNTS MV E/A ratio:  1.25        Systemic VTI:  0.16 m                            Systemic Diam: 1.90 cm Ena Dawley MD Electronically signed by Ena Dawley MD Signature Date/Time: 11/14/2019/1:13:59 PM    Final

## 2019-11-20 NOTE — Progress Notes (Signed)
Patient coughing during eating and drinking. Made NPO until speech evaluation is complete.

## 2019-11-20 NOTE — Progress Notes (Signed)
Inpatient Diabetes Program Recommendations  AACE/ADA: New Consensus Statement on Inpatient Glycemic Control (2015)  Target Ranges:  Prepandial:   less than 140 mg/dL      Peak postprandial:   less than 180 mg/dL (1-2 hours)      Critically ill patients:  140 - 180 mg/dL   Lab Results  Component Value Date   GLUCAP 295 (H) 11/20/2019   HGBA1C 7.0 (H) 09/20/2019    Review of Glycemic Control Results for Robert Little, Robert Little (MRN [id number]) as of 11/20/2019 12:32  Ref. Range 11/19/2019 11:54 11/19/2019 16:03 11/19/2019 20:42 11/20/2019 08:23 11/20/2019 11:10  Glucose-Capillary Latest Ref Range: 70 - 99 mg/dL 202 (H) 287 (H) 289 (H) 174 (H) 295 (H)   Diabetes history:  DM2  Outpatient Diabetes medications:  None  Current orders for Inpatient glycemic control:  Novolog 0-9 TID  Inpatient Diabetes Program Recommendations:    -Novolog 2-3 units tid with meals if eats at least 50% of meal and cbg> 80 mg/dl  Thank you, Reche Dixon, RN, BSN Diabetes Coordinator Inpatient Diabetes Program [phone] (team pager from 8a-5p)

## 2019-11-20 NOTE — Evaluation (Signed)
Clinical/Bedside Swallow Evaluation Patient Details  Name: Robert Little MRN: [id number] Date of Birth: August 01, 1940  Today's Date: 11/20/2019 Time: SLP Start Time (ACUTE ONLY): 52 SLP Stop Time (ACUTE ONLY): 1556 SLP Time Calculation (min) (ACUTE ONLY): 19 min  Past Medical History:  Past Medical History:  Diagnosis Date  . Anemia   . Coronary artery disease   . GERD (gastroesophageal reflux disease)   . Hypertension   . Pulmonary fibrosis ([place])   . Schizo affective schizophrenia [facility])    Past Surgical History:  Past Surgical History:  Procedure Laterality Date  . CORONARY ARTERY BYPASS GRAFT     HPI:  Robert Little is a 79 y.o. male with history of GERD,schizoaffective schizophrenia, dysphagia, CAD, pulmonary fibrosis on home oxygen Covid test came back positive. BSE 09/18/19 placed on nectar and Dys 3.  MBS 3/17 revealing delayed swallow, penetration (PAS 3) with thin via straw, no aspiration however increased risk therefore continued nectar thick. CXR Stable bibasilar fibrotic changes are noted. No definite acute   Assessment / Plan / Recommendation Clinical Impression  Pt has recent history of dysphagia from 09/2019 admission. MBS was performed revealing laryngeal penetration, delayed swallow onset and increased risk therefore nectar thick and Dys 3 texture was recommended. RN obtained this order due to pt coughing during meals and congested sounding. Intermittently followed commands and required redirection due to decreased attention. He did not cough or clear throat but eructation present indicative of possible esophageal involvement. In light of his dysphagia history, Covid and decreased po tolerance with nursing, ST will downgrade texture to Dys 2, nectar, pills whole in puree, sit upright and remain upright after meals. ST will follow for advancement with po's.      SLP Visit Diagnosis: Dysphagia, unspecified (R13.10)    Aspiration Risk  Mild aspiration risk    Diet  Recommendation Dysphagia 2 (Fine chop);Nectar-thick liquid   Liquid Administration via: Cup;Straw Medication Administration: Whole meds with puree Supervision: Patient able to self feed;Full supervision/cueing for compensatory strategies Compensations: Minimize environmental distractions;Slow rate;Small sips/bites Postural Changes: Seated upright at 90 degrees;Remain upright for at least 30 minutes after po intake    Other  Recommendations Oral Care Recommendations: Oral care BID Other Recommendations: Order thickener from pharmacy   Follow up Recommendations Other (comment)(TBD)      Frequency and Duration min 2x/week  2 weeks       Prognosis Prognosis for Safe Diet Advancement: Good Barriers to Reach Goals: Cognitive deficits      Swallow Study   General HPI: Robert Little is a 79 y.o. male with history of GERD,schizoaffective schizophrenia, dysphagia, CAD, pulmonary fibrosis on home oxygen Covid test came back positive. BSE 09/18/19 placed on nectar and Dys 3.  MBS 3/17 revealing delayed swallow, penetration (PAS 3) with thin via straw, no aspiration however increased risk therefore continued nectar thick. CXR Stable bibasilar fibrotic changes are noted. No definite acute Type of Study: Bedside Swallow Evaluation Previous Swallow Assessment: (see HPI) Diet Prior to this Study: Regular;Thin liquids Temperature Spikes Noted: No Respiratory Status: Nasal cannula History of Recent Intubation: No Behavior/Cognition: Alert;Confused;Requires cueing;Distractible(restless) Oral Cavity Assessment: Dry Oral Care Completed by SLP: No Oral Cavity - Dentition: Poor condition;Missing dentition Vision: Functional for self-feeding Self-Feeding Abilities: Needs assist Patient Positioning: Upright in bed Baseline Vocal Quality: Low vocal intensity Volitional Cough: Cognitively unable to elicit    Oral/Motor/Sensory Function Overall Oral Motor/Sensory Function: (no focal impairments)   Ice  Chips Ice chips: Not tested   Thin Liquid Thin  Liquid: Impaired Presentation: Straw;Cup Oral Phase Impairments: Reduced labial seal Oral Phase Functional Implications: Left anterior spillage;Right anterior spillage Pharyngeal  Phase Impairments: (none)    Nectar Thick Nectar Thick Liquid: Not tested   Honey Thick Honey Thick Liquid: Not tested   Puree Puree: Within functional limits   Solid     Solid: Impaired Oral Phase Impairments: Reduced lingual movement/coordination      Houston Siren 11/20/2019,4:31 PM  Orbie Pyo [place].Ed Risk analyst [phone] Office [phone]

## 2019-11-21 LAB — CBC WITH DIFFERENTIAL/PLATELET
Abs Immature Granulocytes: 0.56 10*3/uL — ABNORMAL HIGH (ref 0.00–0.07)
Basophils Absolute: 0 10*3/uL (ref 0.0–0.1)
Basophils Relative: 0 %
Eosinophils Absolute: 0.1 10*3/uL (ref 0.0–0.5)
Eosinophils Relative: 1 %
HCT: 25.4 % — ABNORMAL LOW (ref 39.0–52.0)
Hemoglobin: 8.1 g/dL — ABNORMAL LOW (ref 13.0–17.0)
Immature Granulocytes: 6 %
Lymphocytes Relative: 14 %
Lymphs Abs: 1.3 10*3/uL (ref 0.7–4.0)
MCH: 29 pg (ref 26.0–34.0)
MCHC: 31.9 g/dL (ref 30.0–36.0)
MCV: 91 fL (ref 80.0–100.0)
Monocytes Absolute: 0.8 10*3/uL (ref 0.1–1.0)
Monocytes Relative: 9 %
Neutro Abs: 6.7 10*3/uL (ref 1.7–7.7)
Neutrophils Relative %: 70 %
Platelets: 258 10*3/uL (ref 150–400)
RBC: 2.79 MIL/uL — ABNORMAL LOW (ref 4.22–5.81)
RDW: 16.7 % — ABNORMAL HIGH (ref 11.5–15.5)
WBC: 9.5 10*3/uL (ref 4.0–10.5)
nRBC: 0.3 % — ABNORMAL HIGH (ref 0.0–0.2)

## 2019-11-21 LAB — COMPREHENSIVE METABOLIC PANEL
ALT: 30 U/L (ref 0–44)
AST: 40 U/L (ref 15–41)
Albumin: 2.6 g/dL — ABNORMAL LOW (ref 3.5–5.0)
Alkaline Phosphatase: 76 U/L (ref 38–126)
Anion gap: 11 (ref 5–15)
BUN: 51 mg/dL — ABNORMAL HIGH (ref 8–23)
CO2: 22 mmol/L (ref 22–32)
Calcium: 8.2 mg/dL — ABNORMAL LOW (ref 8.9–10.3)
Chloride: 109 mmol/L (ref 98–111)
Creatinine, Ser: 2.57 mg/dL — ABNORMAL HIGH (ref 0.61–1.24)
GFR calc Af Amer: 27 mL/min — ABNORMAL LOW (ref >60)
GFR calc non Af Amer: 23 mL/min — ABNORMAL LOW (ref >60)
Glucose, Bld: 216 mg/dL — ABNORMAL HIGH (ref 70–99)
Potassium: 2.8 mmol/L — ABNORMAL LOW (ref 3.5–5.1)
Sodium: 142 mmol/L (ref 135–145)
Total Bilirubin: 1.1 mg/dL (ref 0.3–1.2)
Total Protein: 5.7 g/dL — ABNORMAL LOW (ref 6.5–8.1)

## 2019-11-21 LAB — D-DIMER, QUANTITATIVE: D-Dimer, Quant: 2 ug/mL-FEU — ABNORMAL HIGH (ref 0.00–0.50)

## 2019-11-21 LAB — GLUCOSE, CAPILLARY
Glucose-Capillary: 186 mg/dL — ABNORMAL HIGH (ref 70–99)
Glucose-Capillary: 160 mg/dL — ABNORMAL HIGH (ref 70–99)
Glucose-Capillary: 286 mg/dL — ABNORMAL HIGH (ref 70–99)
Glucose-Capillary: 231 mg/dL — ABNORMAL HIGH (ref 70–99)

## 2019-11-21 LAB — BRAIN NATRIURETIC PEPTIDE: B Natriuretic Peptide: 396.7 pg/mL — ABNORMAL HIGH (ref 0.0–100.0)

## 2019-11-21 LAB — C-REACTIVE PROTEIN: CRP: 10.7 mg/dL — ABNORMAL HIGH (ref <1.0)

## 2019-11-21 LAB — PROCALCITONIN: Procalcitonin: 0.19 ng/mL

## 2019-11-21 MED ORDER — POTASSIUM CHLORIDE 10 MEQ/100ML IV SOLN
10.0000 meq | INTRAVENOUS | Status: AC
  Administered 2019-11-21 (×4): 10 meq via INTRAVENOUS

## 2019-11-21 MED ORDER — POTASSIUM CHLORIDE 20 MEQ PO PACK
40.0000 meq | PACK | Freq: Once | ORAL | Status: AC
  Administered 2019-11-21: 40 meq via ORAL
  Filled 2019-11-21: qty 2

## 2019-11-21 MED ORDER — LACTATED RINGERS IV BOLUS
500.0000 mL | INTRAVENOUS | Status: AC
  Administered 2019-11-21: 500 mL via INTRAVENOUS

## 2019-11-21 MED ORDER — CLONAZEPAM 0.5 MG PO TABS
0.5000 mg | ORAL_TABLET | Freq: Every day | ORAL | Status: DC
  Administered 2019-11-22 – 2019-11-29 (×8): 0.5 mg via ORAL
  Filled 2019-11-21 (×8): qty 1

## 2019-11-21 NOTE — Progress Notes (Addendum)
PROGRESS NOTE                                                                                                                                                                                                             Patient Demographics:    Robert Little, is a 79 y.o. male, DOB - 06-11-1941, SPQ:[id number]  Outpatient Primary MD for the patient is [name]    LOS - 9  Admit date - 11/12/2019    Chief Complaint  Patient presents with  . Shortness of Breath       Brief Narrative  -  Robert Little is a 79 y.o. male with history of his affective disorder, CAD, pulmonary fibrosis on home oxygen usually 2 L anemia hypertension diabetes mellitus recently admitted for hypoglycemia with use of sulfonylureas in the setting of chronic kidney disease was brought to the ER after patient has been having productive cough over the last 3 days feeling weak and tired not eating well.  ER he was diagnosed with acute hypoxic respiratory failure due to COVID-19 pneumonia, dehydration AKI and NSTEMI.  Was complicated by runs of A. fib with RVR, dehydration, hypotension and AKI.   Subjective:   Patient in bed, pressure was on the lower side this morning, where he required fluid bolus, otherwise no significant events as discussed with staff .   Assessment  & Plan :   Sepsis with Acute Hypoxic Resp. Failure due to Acute Covid 19 Viral Pneumonitis during the ongoing 2020 Covid 19 Pandemic  - he has moderate disease has been started on IV steroids and remdesivir, he finished his IV remdesivir treatment,  Poor candidate for Actemra due to underlying frail status.  Sepsis pathophysiology seems to have resolved.  Note he uses 2 L nasal cannula oxygen at baseline. -Will continue to monitor closely especially inflammatory markers trending up today with CRP up to 10, and D-dimers up to. -Encouraged the patient to sit up in chair in  the daytime use I-S and flutter valve for pulmonary toiletry and then prone in bed when at night.  Will advance activity and titrate down oxygen as possible.   SpO2: 100 % O2 Flow Rate (L/min): 3 L/min  Recent Labs  Lab 11/15/19 0622 11/16/19 0409 11/19/19 0731 11/20/19 0447 11/21/19 0832  CRP 5.3* 4.4* 4.3* 5.2* 10.7*  DDIMER 1.04* 0.87* 1.48* 1.11* 2.00*  BNP 255.2* 234.9* 559.3* 232.8* 396.7*  PROCALCITON  --   --  <0.10 0.12 0.19    Hepatic Function Latest Ref Rng & Units 11/21/2019 11/20/2019 11/18/2019  Total Protein 6.5 - 8.1 g/dL 5.7(L) 6.3(L) 7.1  Albumin 3.5 - 5.0 g/dL 2.6(L) 3.0(L) 3.6  AST 15 - 41 U/L 40 32 38  ALT 0 - 44 U/L 30 25 31   Alk Phosphatase 38 - 126 U/L 76 80 82  Total Bilirubin 0.3 - 1.2 mg/dL 1.1 1.4(H) 1.3(H)    Dehydration with AKI on underlying CKD 4  - with baseline creatinine of around 2.5. IVF now and monitor, developed Urinary retention, Foley-Flomax. -Morning he was hypotensive, altered, and with elevated creatinine yesterday, he did receive fluid bolus today.   History of CAD. Not NSTEMI.  Could be due to demand ischemia from hypoxia, continue aspirin, statin and beta-blocker for secondary prevention, stable echocardiogram, stable EKG and chest pain-free seen by cardiology continue present care with outpatient cardiology follow-up post discharge.  History of schizoaffective disorder takes monthly injection of Invega and daily on Klonopin and Cogentin.  - Follows at [facility] follow up.  According to the POA he has been extremely weak for the last 6 months and they were unable to take him to his psychiatrist for follow-up. -This morning patient appears to be more altered, lethargic, so I will go ahead and stop his daytime Klonopin.  As well he was started on Seroquel overnight which I will hold as well.  History of pulmonary fibrosis now on 2 L home oxygen.  Continue supportive care with a liters as needed.  Outpatient pulmonary  follow-up.  Anemia of chronic disease.  Stable will monitor.  Chronic diastolic CHF.  EF on recent echocardiogram was 60%.  Appears dehydrated, hold Lasix, hypotensive this morning when he received a total of 1 L of fluid bolus  Chronically elevated D-dimers.  Previously has had negative lower extremity venous duplex, currently on heparin drip, will trend and monitor.    Essential hypertension.  Now hypotensive - IVF repeat on 11/20/2019 and monitor, discontinued Coreg low-dose Lopressor if tolerated.  Hypokalemia -Potassium was 2.8 this morning, repleted.  Paroxysmal atrial fibrillation with [place] vas 2 score of at least 3 with RVR and Hypotension on 11/19/2019.  He was placed briefly on IV amiodarone and digoxin, case discussed with cardiologist Dr. Ellyn Hack, extremely high risk candidate for anticoagulation due to fall risk and frail status, aspirin, rate better will be placed on low-dose Lopressor along with oral amiodarone.  Continue to monitor.  DM type II.  For now sliding scale, addedm low-dose Lantus as CBGs are running high due to steroid use.   Lab Results  Component Value Date   HGBA1C 7.0 (H) 09/20/2019   CBG (last 3)  Recent Labs    11/20/19 2053 11/21/19 0805 11/21/19 1212  GLUCAP 273* 186* 160*     Condition - Extremely Guarded  Family Communication  : Previous MD DW POA - [name].  [name] states that for the last 6 months he has had multiple hospitalizations and he is getting so weak that he was not able to see his psychiatrist for the last 6 months as well, however he somehow has some unrealistic expectations of outcome and was told that this to Robert Little is a 79 year old gentleman with multiple medical problems and psychiatric problems and now has moderate to severe Covid infection with  complications arising from it which provides a poor and guarded prognosis.  Code Status :  DNR  Consults  :  Cards,  Psych  Disposition Plan  :    Status is: Inpatient  Remains inpatient appropriate because:Inpatient level of care appropriate due to severity of illness  Dispo: The patient is from: Group home              Anticipated d/c is to: Group home              Anticipated d/c date is: 3 days              Patient currently is not medically stable to d/c. Covid, Afib RVR, AKI     Procedures  :    TTE - 1. Left ventricular ejection fraction, by estimation, is 60 to 65%. The left ventricle has normal function. The left ventricle has no regional wall motion abnormalities. There is mild concentric left ventricular hypertrophy. Left ventricular diastolic parameters are consistent with Grade II diastolic dysfunction  (pseudonormalization). Elevated left atrial pressure.  2. Right ventricular systolic function is normal. The right ventricular size is normal. There is normal pulmonary artery systolic pressure.  3. Left atrial size was mildly dilated.  4. The mitral valve is normal in structure. Mild mitral valve regurgitation. No evidence of mitral stenosis.  5. The aortic valve is normal in structure. Aortic valve regurgitation is not visualized. No aortic stenosis is present.  6. The inferior vena cava is normal in size with greater than 50% respiratory variability, suggesting right atrial pressure of 3 mmHg.   PUD Prophylaxis :  None  DVT Prophylaxis  :   Heparin   Lab Results  Component Value Date   PLT 258 11/21/2019    Diet :  Diet Order            DIET DYS 2 Room service appropriate? No; Fluid consistency: Nectar Thick  Diet effective now               Inpatient Medications  Scheduled Meds: . albuterol  2 puff Inhalation Q2200  . amiodarone  200 mg Oral Daily  . aspirin EC  81 mg Oral Daily  . atorvastatin  20 mg Oral QHS  . benztropine  0.5 mg Oral BID  . Chlorhexidine Gluconate Cloth  6 each Topical Daily  . [START ON 11/22/2019] clonazePAM  0.5 mg Oral QHS  . ferrous  sulfate  325 mg Oral BID WC  . fluticasone furoate-vilanterol  1 puff Inhalation Daily  . heparin injection (subcutaneous)  5,000 Units Subcutaneous Q8H  . insulin aspart  0-6 Units Subcutaneous TID WC  . lidocaine  1 application Urethral Once  . metoprolol tartrate  25 mg Oral BID  . pantoprazole  40 mg Oral Daily  . QUEtiapine  25 mg Oral QHS  . tamsulosin  0.4 mg Oral Daily  . umeclidinium bromide  1 puff Inhalation Daily  . vitamin B-12  500 mcg Oral BID   Continuous Infusions: . potassium chloride 10 mEq (11/21/19 1332)   PRN Meds:.ALPRAZolam, [DISCONTINUED] ondansetron **OR** ondansetron (ZOFRAN) IV, Resource ThickenUp Clear, zolpidem  Antibiotics  :    Anti-infectives (From admission, onward)   Start     Dose/Rate Route Frequency Ordered Stop   11/13/19 1600  remdesivir 100 mg in sodium chloride 0.9 % 100 mL IVPB     100 mg 200 mL/hr over 30 Minutes Intravenous Daily 11/12/19 2146 11/16/19 1700   11/12/19 2300  remdesivir 200 mg in sodium chloride 0.9% 250 mL IVPB     200 mg 580 mL/hr over 30 Minutes Intravenous Once 11/12/19 2146 11/13/19 0104   11/12/19 2130  cefTRIAXone (ROCEPHIN) 2 g in sodium chloride 0.9 % 100 mL IVPB  Status:  Discontinued     2 g 200 mL/hr over 30 Minutes Intravenous  Once 11/12/19 2056 11/12/19 2125   11/12/19 2130  azithromycin (ZITHROMAX) 500 mg in sodium chloride 0.9 % 250 mL IVPB  Status:  Discontinued     500 mg 250 mL/hr over 60 Minutes Intravenous  Once 11/12/19 2056 11/12/19 2125       Time Spent in minutes  30   Phillips Climes M.D on 11/21/2019 at 2:04 PM  To page go to [email or web address] - password [facility]  [facility] -  Office  [phone]  See all Orders from today for further details    Objective:   Vitals:   11/21/19 0802 11/21/19 0807 11/21/19 0808 11/21/19 0918  BP: (!) 126/106  98/69 (!) 86/57  Pulse: (!) 164   76  Resp: (!) 21 19 20 20   Temp: 97.6 F (36.4 C)   98.1 F (36.7 C)  TempSrc:    Axillary  SpO2:  95%   100%  Weight:      Height:        Wt Readings from Last 3 Encounters:  11/13/19 69.4 kg  10/28/19 69.4 kg  10/10/19 69.4 kg     Intake/Output Summary (Last 24 hours) at 11/21/2019 1404 Last data filed at 11/21/2019 1401 Gross per 24 hour  Intake 1265.69 ml  Output 1400 ml  Net -134.31 ml     Physical Exam  lethargic this morning, minimally responsive , extremely frail and ill-appearing male . Symmetrical Chest wall movement, Good air movement bilaterally, CTAB RRR,No Gallops,Rubs or new Murmurs, No Parasternal Heave +ve B.Sounds, Abd Soft, No tenderness, No rebound - guarding or rigidity. No Cyanosis, Clubbing or edema, No new Rash or bruise      Data Review:    CBC Recent Labs  Lab 11/15/19 0622 11/15/19 0622 11/16/19 0409 11/18/19 0322 11/19/19 0731 11/20/19 0447 11/21/19 0832  WBC 11.0*   < > 10.7* 17.2* 14.2* 11.8* 9.5  HGB 8.8*   < > 8.7* 9.5* 9.1* 9.0* 8.1*  HCT 27.5*   < > 26.7* 28.8* 28.3* 29.2* 25.4*  PLT 266   < > 258 361 324 PLATELET CLUMPS NOTED ON SMEAR, UNABLE TO ESTIMATE 258  MCV 89.9   < > 89.0 89.7 90.1 92.7 91.0  MCH 28.8   < > 29.0 29.6 29.0 28.6 29.0  MCHC 32.0   < > 32.6 33.0 32.2 30.8 31.9  RDW 15.8*   < > 15.6* 15.6* 16.1* 16.5* 16.7*  LYMPHSABS 1.1  --  1.0 2.0  --  1.5 1.3  MONOABS 0.5  --  0.6 1.1*  --  0.9 0.8  EOSABS 0.0  --  0.0 0.0  --  0.1 0.1  BASOSABS 0.0  --  0.0 0.1  --  0.0 0.0   < > = values in this interval not displayed.    Chemistries  Recent Labs  Lab 11/15/19 0622 11/15/19 0622 11/16/19 0409 11/18/19 0322 11/19/19 0731 11/20/19 0447 11/21/19 0832  NA 142   < > 142 142 147* 146* 142  K 4.1   < > 4.5 4.4 5.0 5.4* 2.8*  CL 111   < > 108 105 114* 114* 109  CO2 20*   < >  23 21* 18* 20* 22  GLUCOSE 167*   < > 179* 166* 183* 181* 216*  BUN 53*   < > 58* 93* 106* 89* 51*  CREATININE 2.08*   < > 2.14* 3.22* 3.59* 3.06* 2.57*  CALCIUM 8.2*   < > 8.9 9.5 9.0 9.0 8.2*  AST 34  --  32 38  --  32 40  ALT 21   --  25 31  --  25 30  ALKPHOS 63  --  63 82  --  80 76  BILITOT 0.7  --  0.9 1.3*  --  1.4* 1.1  MG 1.7  --  1.7  --  1.9  --   --    < > = values in this interval not displayed.     ------------------------------------------------------------------------------------------------------------------ No results for input(s): CHOL, HDL, LDLCALC, TRIG, CHOLHDL, LDLDIRECT in the last 72 hours.  Lab Results  Component Value Date   HGBA1C 7.0 (H) 09/20/2019   ------------------------------------------------------------------------------------------------------------------ No results for input(s): TSH, T4TOTAL, T3FREE, THYROIDAB in the last 72 hours.  Invalid input(s): FREET3  Cardiac Enzymes No results for input(s): CKMB, TROPONINI, MYOGLOBIN in the last 168 hours.  Invalid input(s): CK ------------------------------------------------------------------------------------------------------------------    Component Value Date/Time   BNP 396.7 (H) 11/21/2019 7371    Micro Results Recent Results (from the past 240 hour(s))  SARS Coronavirus 2 by RT PCR (hospital order, performed in [facility] hospital lab) Nasopharyngeal Nasopharyngeal Swab     Status: Abnormal   Collection Time: 11/12/19  7:16 PM   Specimen: Nasopharyngeal Swab  Result Value Ref Range Status   SARS Coronavirus 2 POSITIVE (A) NEGATIVE Final    Comment: RESULT CALLED TO, READ BACK BY AND VERIFIED WITH: T KELLY RN 11/12/19 2119 JDW (NOTE) SARS-CoV-2 target nucleic acids are DETECTED SARS-CoV-2 RNA is generally detectable in upper respiratory specimens  during the acute phase of infection.  Positive results are indicative  of the presence of the identified virus, but do not rule out bacterial infection or co-infection with other pathogens not detected by the test.  Clinical correlation with patient history and  other diagnostic information is necessary to determine patient infection status.  The expected result is  negative. Fact Sheet for Patients:   StrictlyIdeas.no  Fact Sheet for Healthcare Providers:   BankingDealers.co.za   This test is not yet approved or cleared by the [place] FDA and  has been authorized for detection and/or diagnosis of SARS-CoV-2 by FDA under an Emergency Use Authorization (EUA).  This EUA will remain in effect (meaning this test can be used)  for the duration of  the COVID-19 declaration under Section 564(b)(1) of the Act, 21 U.S.C. section 360-bbb-3(b)(1), unless the authorization is terminated or revoked sooner. Performed at [facility], [place] [address] [place], [place] [address]   Blood Culture (routine x 2)     Status: None   Collection Time: 11/12/19  7:16 PM   Specimen: BLOOD  Result Value Ref Range Status   Specimen Description BLOOD LEFT FOREARM  Final   Special Requests   Final    BOTTLES DRAWN AEROBIC AND ANAEROBIC Blood Culture results may not be optimal due to an excessive volume of blood received in culture bottles   Culture   Final    NO GROWTH 5 DAYS Performed at [facility], [place] [address] [place], [place] [address]    Report Status 11/17/2019 FINAL  Final  Urine culture     Status:  None   Collection Time: 11/12/19  7:16 PM   Specimen: In/Out Cath Urine  Result Value Ref Range Status   Specimen Description IN/OUT CATH URINE  Final   Special Requests NONE  Final   Culture   Final    NO GROWTH Performed at [facility], [place] [address] [place], [place] [address]    Report Status 11/13/2019 FINAL  Final  Blood Culture (routine x 2)     Status: None   Collection Time: 11/12/19  7:21 PM   Specimen: BLOOD  Result Value Ref Range Status   Specimen Description BLOOD RIGHT FOREARM  Final   Special Requests   Final    BOTTLES DRAWN AEROBIC AND ANAEROBIC Blood Culture results may not be optimal due to an inadequate volume of blood received in culture bottles   Culture   Final     NO GROWTH 5 DAYS Performed at [facility], [place] [address] [place], [place] [address]    Report Status 11/17/2019 FINAL  Final    Radiology Reports CT Head Wo Contrast  Result Date: 10/28/2019 CLINICAL DATA:  Un witnessed fall, weakness, confusion EXAM: CT HEAD WITHOUT CONTRAST TECHNIQUE: Contiguous axial images were obtained from the base of the skull through the vertex without intravenous contrast. COMPARISON:  None. FINDINGS: Brain: Hypodensities in the periventricular white matter, most pronounced in the left frontal region, most likely reflect chronic small vessel ischemic change. No signs of acute infarct or hemorrhage. Lateral ventricles and remaining midline structures are unremarkable. No acute extra-axial fluid collections. No mass effect. Vascular: No hyperdense vessel or unexpected calcification. Skull: Normal. Negative for fracture or focal lesion. Sinuses/Orbits: Mucoperiosteal thickening within the maxillary sinuses, right greater than left. Remaining sinuses are clear. Other: None. IMPRESSION: 1. No acute intracranial trauma. 2. Likely chronic small vessel ischemic changes within the frontal periventricular white matter. Electronically Signed   By: Randa Ngo M.D.   On: 10/28/2019 19:31   DG Chest Port 1 View  Result Date: 11/20/2019 CLINICAL DATA:  Shortness of breath, cough. EXAM: PORTABLE CHEST 1 VIEW COMPARISON:  Nov 19, 2019. FINDINGS: Stable cardiomediastinal silhouette. Sternotomy wires are noted. No pneumothorax or pleural effusion is noted. Stable fibrotic changes noted in the lung bases. No definite acute abnormality is noted. Bony thorax is unremarkable. IMPRESSION: Stable bibasilar fibrotic changes are noted. No definite acute cardiopulmonary abnormality seen. Electronically Signed   By: Marijo Conception M.D.   On: 11/20/2019 08:32   DG Chest Port 1 View  Result Date: 11/19/2019 CLINICAL DATA:  Shortness of breath EXAM: PORTABLE CHEST 1 VIEW COMPARISON:  Nov 12, 2019 chest radiograph and chest CT September 19, 2019 FINDINGS: There is fibrotic change scattered throughout the lungs, most notably in the mid and lower lung regions, essentially stable. No new opacity evident. Heart is upper normal in size with pulmonary vascularity normal. Patient is status post coronary artery bypass grafting. No adenopathy appreciable. There is aortic atherosclerosis. No evident bone lesions. IMPRESSION: Stable fibrotic type change, most severe in the mid and lower lung regions. No new opacity evident. Stable cardiac silhouette. Postoperative changes noted. Aortic Atherosclerosis (ICD10-I70.0). Electronically Signed   By: Lowella Grip III M.D.   On: 11/19/2019 08:24   DG Chest Port 1 View  Result Date: 11/12/2019 CLINICAL DATA:  Shortness of breath and sepsis. Fever. Productive cough. EXAM: PORTABLE CHEST 1 VIEW COMPARISON:  10/28/2019.  09/19/2019. FINDINGS: The patient has a history of pulmonary fibrosis and emphysema. Chronic  lung markings appear similar allowing for technical differences. Upper lobe emphysema as seen previously. No sign of dense consolidation, collapse or effusion. Previous median sternotomy and CABG. Aortic atherosclerosis. No acute bone finding. IMPRESSION: Portable film with the poor inspiration. Chronic fibrotic lung disease and upper lung emphysema. No sign of acute infiltrate, collapse or effusion. Electronically Signed   By: Nelson Chimes M.D.   On: 11/12/2019 19:37   DG Chest Portable 1 View  Result Date: 10/28/2019 CLINICAL DATA:  79 year old male with altered mental status. EXAM: PORTABLE CHEST 1 VIEW COMPARISON:  Chest radiograph dated 09/19/2019 and CT dated 09/19/2019 FINDINGS: Diffuse interstitial scarring in keeping with fibrosis. No new consolidation. There is no pleural effusion or pneumothorax. Stable cardiomediastinal silhouette. Atherosclerotic calcification of the aorta. Median sternotomy wires and CABG vascular clips. IMPRESSION: 1. No  acute cardiopulmonary process. 2. Fibrotic changes. Electronically Signed   By: Anner Crete M.D.   On: 10/28/2019 21:16   ECHOCARDIOGRAM COMPLETE  Result Date: 11/14/2019    ECHOCARDIOGRAM REPORT   Patient Name:   JHETT FRETWELL Date of Exam: 11/14/2019 Medical Rec #:  [id number]    Height:       68.0 in Accession #:    [id number]   Weight:       153.0 lb Date of Birth:  March 06, 1941    BSA:          1.824 m Patient Age:    73 years     BP:           131/61 mmHg Patient Gender: M            HR:           58 bpm. Exam Location:  Inpatient Procedure: 2D Echo, Cardiac Doppler and Color Doppler Indications:    Elevated Troponin  History:        Patient has prior history of Echocardiogram examinations, most                 recent 09/06/2019. CHF, CAD; Risk Factors:Hypertension,                 Dyslipidemia and Former Smoker.  Sonographer:    Vickie Epley RDCS Referring Phys: [id number] RHONDA G BARRETT  Sonographer Comments: Covid positive. IMPRESSIONS  1. Left ventricular ejection fraction, by estimation, is 60 to 65%. The left ventricle has normal function. The left ventricle has no regional wall motion abnormalities. There is mild concentric left ventricular hypertrophy. Left ventricular diastolic parameters are consistent with Grade II diastolic dysfunction (pseudonormalization). Elevated left atrial pressure.  2. Right ventricular systolic function is normal. The right ventricular size is normal. There is normal pulmonary artery systolic pressure.  3. Left atrial size was mildly dilated.  4. The mitral valve is normal in structure. Mild mitral valve regurgitation. No evidence of mitral stenosis.  5. The aortic valve is normal in structure. Aortic valve regurgitation is not visualized. No aortic stenosis is present.  6. The inferior vena cava is normal in size with greater than 50% respiratory variability, suggesting right atrial pressure of 3 mmHg. FINDINGS  Left Ventricle: Left ventricular ejection fraction, by  estimation, is 60 to 65%. The left ventricle has normal function. The left ventricle has no regional wall motion abnormalities. The left ventricular internal cavity size was normal in size. There is  mild concentric left ventricular hypertrophy. Left ventricular diastolic parameters are consistent with Grade II diastolic dysfunction (pseudonormalization). Elevated left atrial pressure. Right Ventricle: The right ventricular size is  normal. No increase in right ventricular wall thickness. Right ventricular systolic function is normal. There is normal pulmonary artery systolic pressure. The tricuspid regurgitant velocity is 2.25 m/s, and  with an assumed right atrial pressure of 3 mmHg, the estimated right ventricular systolic pressure is 77.4 mmHg. Left Atrium: Left atrial size was mildly dilated. Right Atrium: Right atrial size was normal in size. Pericardium: There is no evidence of pericardial effusion. Mitral Valve: The mitral valve is normal in structure. Normal mobility of the mitral valve leaflets. Mild mitral valve regurgitation. No evidence of mitral valve stenosis. Tricuspid Valve: The tricuspid valve is normal in structure. Tricuspid valve regurgitation is not demonstrated. No evidence of tricuspid stenosis. Aortic Valve: The aortic valve is normal in structure. Aortic valve regurgitation is not visualized. No aortic stenosis is present. Pulmonic Valve: The pulmonic valve was normal in structure. Pulmonic valve regurgitation is not visualized. No evidence of pulmonic stenosis. Aorta: The aortic root is normal in size and structure. Venous: The inferior vena cava is normal in size with greater than 50% respiratory variability, suggesting right atrial pressure of 3 mmHg. IAS/Shunts: No atrial level shunt detected by color flow Doppler.  LEFT VENTRICLE PLAX 2D LVIDd:         4.70 cm      Diastology LVIDs:         3.42 cm      LV e' lateral:   7.14 cm/s LV PW:         0.80 cm      LV E/e' lateral: 12.3 LV IVS:         0.80 cm      LV e' medial:    4.62 cm/s LVOT diam:     1.90 cm      LV E/e' medial:  19.0 LV SV:         45 LV SV Index:   25 LVOT Area:     2.84 cm  LV Volumes (MOD) LV vol d, MOD A2C: 103.0 ml LV vol d, MOD A4C: 115.0 ml LV vol s, MOD A2C: 61.9 ml LV vol s, MOD A4C: 46.6 ml LV SV MOD A2C:     41.1 ml LV SV MOD A4C:     115.0 ml LV SV MOD BP:      57.4 ml RIGHT VENTRICLE RV S prime:     10.10 cm/s TAPSE (M-mode): 1.6 cm LEFT ATRIUM             Index       RIGHT ATRIUM           Index LA diam:        4.20 cm 2.30 cm/m  RA Area:     10.30 cm LA Vol (A2C):   24.4 ml 13.38 ml/m RA Volume:   18.10 ml  9.92 ml/m LA Vol (A4C):   33.3 ml 18.26 ml/m LA Biplane Vol: 31.4 ml 17.22 ml/m  AORTIC VALVE LVOT Vmax:   70.00 cm/s LVOT Vmean:  40.000 cm/s LVOT VTI:    0.160 m  AORTA Ao Root diam: 3.10 cm MITRAL VALVE               TRICUSPID VALVE MV Area (PHT): 5.02 cm    TR Peak grad:   20.2 mmHg MV Decel Time: 151 msec    TR Vmax:        225.00 cm/s MV E velocity: 87.80 cm/s MV A velocity: 70.30 cm/s  SHUNTS MV E/A ratio:  1.25  Systemic VTI:  0.16 m                            Systemic Diam: 1.90 cm Ena Dawley MD Electronically signed by Ena Dawley MD Signature Date/Time: 11/14/2019/1:13:59 PM    Final

## 2019-11-21 NOTE — Progress Notes (Signed)
  Speech Language Pathology Treatment: Dysphagia  Patient Details Name: Robert Little MRN: [id number] DOB: 1941/07/03 Today's Date: 11/21/2019 Time: 3662-9476 SLP Time Calculation (min) (ACUTE ONLY): 8 min  Assessment / Plan / Recommendation Clinical Impression  Pt demonstrates tolerance of current nectar thick liquid texture, but continues to need pacting and total assist positioning with ongoing risk of unsafe feeding behaviors, reflux and respiratory instability. Pt to remain on current diet until there is improvement in his general strength, ability to self feed, follow commands and precautions. Will follow acutely.   HPI HPI: Robert Little is a 79 y.o. male with history of GERD,schizoaffective schizophrenia, dysphagia, CAD, pulmonary fibrosis on home oxygen Covid test came back positive. BSE 09/18/19 placed on nectar and Dys 3.  MBS 3/17 revealing delayed swallow, penetration (PAS 3) with thin via straw, no aspiration however increased risk therefore continued nectar thick. CXR Stable bibasilar fibrotic changes are noted. No definite acute      SLP Plan  Continue with current plan of care       Recommendations  Diet recommendations: Dysphagia 2 (fine chop);Nectar-thick liquid Liquids provided via: Straw Medication Administration: Whole meds with puree Supervision: Staff to assist with self feeding;Full supervision/cueing for compensatory strategies Compensations: Minimize environmental distractions;Slow rate;Small sips/bites Postural Changes and/or Swallow Maneuvers: Seated upright 90 degrees                Oral Care Recommendations: Oral care BID Plan: Continue with current plan of care       GO                Evangeline Utley, Katherene Ponto 11/21/2019, 2:25 PM

## 2019-11-21 NOTE — Progress Notes (Signed)
Inpatient Diabetes Program Recommendations  AACE/ADA: New Consensus Statement on Inpatient Glycemic Control (2015)  Target Ranges:  Prepandial:   less than 140 mg/dL      Peak postprandial:   less than 180 mg/dL (1-2 hours)      Critically ill patients:  140 - 180 mg/dL   Lab Results  Component Value Date   GLUCAP 186 (H) 11/21/2019   HGBA1C 7.0 (H) 09/20/2019    Review of Glycemic Control  Results for KHRISTIAN, Robert Little (MRN [id number]) as of 11/21/2019 12:04  Ref. Range 11/20/2019 08:23 11/20/2019 11:10 11/20/2019 15:41 11/20/2019 20:53 11/21/2019 08:05  Glucose-Capillary Latest Ref Range: 70 - 99 mg/dL 174 (H) 295 (H) 267 (H) 273 (H) 186 (H)    Diabetes history:  DM2  Outpatient Diabetes medications:  None  Current orders for Inpatient glycemic control:  Novolog 0-9 TID  Inpatient Diabetes Program Recommendations:   Post prandials continue to be elevated.  Please consider,  -Novolog 2-3 units tid with meals if eats at least 50% of meal and cbg> 80 mg/dl  Thank you, Reche Dixon, RN, BSN Diabetes Coordinator Inpatient Diabetes Program [phone] (team pager from 8a-5p)

## 2019-11-22 DIAGNOSIS — J8410 Pulmonary fibrosis, unspecified: Secondary | ICD-10-CM

## 2019-11-22 LAB — GLUCOSE, CAPILLARY
Glucose-Capillary: 178 mg/dL — ABNORMAL HIGH (ref 70–99)
Glucose-Capillary: 230 mg/dL — ABNORMAL HIGH (ref 70–99)
Glucose-Capillary: 201 mg/dL — ABNORMAL HIGH (ref 70–99)
Glucose-Capillary: 144 mg/dL — ABNORMAL HIGH (ref 70–99)

## 2019-11-22 LAB — COMPREHENSIVE METABOLIC PANEL
ALT: 34 U/L (ref 0–44)
AST: 43 U/L — ABNORMAL HIGH (ref 15–41)
Albumin: 2.7 g/dL — ABNORMAL LOW (ref 3.5–5.0)
Alkaline Phosphatase: 80 U/L (ref 38–126)
Anion gap: 13 (ref 5–15)
BUN: 44 mg/dL — ABNORMAL HIGH (ref 8–23)
CO2: 21 mmol/L — ABNORMAL LOW (ref 22–32)
Calcium: 8.6 mg/dL — ABNORMAL LOW (ref 8.9–10.3)
Chloride: 113 mmol/L — ABNORMAL HIGH (ref 98–111)
Creatinine, Ser: 2.45 mg/dL — ABNORMAL HIGH (ref 0.61–1.24)
GFR calc Af Amer: 28 mL/min — ABNORMAL LOW (ref >60)
GFR calc non Af Amer: 24 mL/min — ABNORMAL LOW (ref >60)
Glucose, Bld: 198 mg/dL — ABNORMAL HIGH (ref 70–99)
Potassium: 4 mmol/L (ref 3.5–5.1)
Sodium: 147 mmol/L — ABNORMAL HIGH (ref 135–145)
Total Bilirubin: 1.5 mg/dL — ABNORMAL HIGH (ref 0.3–1.2)
Total Protein: 6.1 g/dL — ABNORMAL LOW (ref 6.5–8.1)

## 2019-11-22 LAB — C-REACTIVE PROTEIN: CRP: 12 mg/dL — ABNORMAL HIGH (ref <1.0)

## 2019-11-22 LAB — D-DIMER, QUANTITATIVE: D-Dimer, Quant: 2.4 ug/mL-FEU — ABNORMAL HIGH (ref 0.00–0.50)

## 2019-11-22 LAB — BRAIN NATRIURETIC PEPTIDE
B Natriuretic Peptide: 618.3 pg/mL — ABNORMAL HIGH (ref 0.0–100.0)
B Natriuretic Peptide: 749.8 pg/mL — ABNORMAL HIGH (ref 0.0–100.0)

## 2019-11-22 MED ORDER — ACETAMINOPHEN 325 MG PO TABS
650.0000 mg | ORAL_TABLET | Freq: Four times a day (QID) | ORAL | Status: DC | PRN
  Administered 2019-11-22: 650 mg via ORAL
  Filled 2019-11-22: qty 2

## 2019-11-22 MED ORDER — INSULIN ASPART 100 UNIT/ML ~~LOC~~ SOLN
3.0000 [IU] | Freq: Three times a day (TID) | SUBCUTANEOUS | Status: DC
  Administered 2019-11-22 – 2019-11-30 (×26): 3 [IU] via SUBCUTANEOUS

## 2019-11-22 MED ORDER — QUETIAPINE FUMARATE 25 MG PO TABS
25.0000 mg | ORAL_TABLET | Freq: Every day | ORAL | Status: DC
  Administered 2019-11-22 – 2019-11-29 (×8): 25 mg via ORAL
  Filled 2019-11-22 (×8): qty 1

## 2019-11-22 NOTE — Progress Notes (Signed)
PROGRESS NOTE                                                                                                                                                                                                             Patient Demographics:    Robert Little, is a 79 y.o. male, DOB - [birth year], HEN:[id number]  Outpatient Primary MD for the patient is [name]    LOS - 10  Admit date - 11/12/2019    Chief Complaint  Patient presents with  . Shortness of Breath       Brief Narrative   -  Robert Little is a 79 y.o. male with history of his affective disorder, CAD, pulmonary fibrosis on home oxygen usually 2 L anemia hypertension diabetes mellitus recently admitted for hypoglycemia with use of sulfonylureas in the setting of chronic kidney disease was brought to the ER after patient has been having productive cough over the last 3 days feeling weak and tired not eating well.  ER he was diagnosed with acute hypoxic respiratory failure due to COVID-19 pneumonia, dehydration AKI and NSTEMI.  Was complicated by runs of A. fib with RVR, dehydration, hypotension and AKI.   Subjective:   Patient in bed, patient had a poor night sleep as discussed with staff, has been more fidgety, otherwise no significant events .    Assessment  & Plan :   Sepsis with Acute Hypoxic Resp. Failure due to Acute Covid 19 Viral Pneumonitis during the ongoing 2020 Covid 19 Pandemic  - he has moderate disease has been started on IV steroids and remdesivir, he finished his IV remdesivir treatment,  Poor candidate for Actemra due to underlying frail status.  Sepsis pathophysiology seems to have resolved.  Note he uses 2 L nasal cannula oxygen at baseline. -Continue to trend inflammatory markers closely, especially CRP and D-dimers keep trending up .  I will recheck procalcitonin to ensure there is no underlying bacterial  infection. -Encouraged the patient to sit up in chair in the daytime use I-S and flutter valve for pulmonary toiletry and then prone in bed when at night.  Will advance activity and titrate down oxygen as possible.   SpO2: 91 % O2 Flow Rate (L/min): 2 L/min  Recent Labs  Lab 11/16/19 0409 11/19/19 0731 11/20/19 0447 11/21/19 3614 11/22/19 4315  CRP 4.4* 4.3* 5.2* 10.7* 12.0*  DDIMER 0.87* 1.48* 1.11* 2.00* 2.40*  BNP 234.9* 559.3* 232.8* 396.7* 618.3*  PROCALCITON  --  <0.10 0.12 0.19  --     Hepatic Function Latest Ref Rng & Units 11/22/2019 11/21/2019 11/20/2019  Total Protein 6.5 - 8.1 g/dL 6.1(L) 5.7(L) 6.3(L)  Albumin 3.5 - 5.0 g/dL 2.7(L) 2.6(L) 3.0(L)  AST 15 - 41 U/L 43(H) 40 32  ALT 0 - 44 U/L 34 30 25  Alk Phosphatase 38 - 126 U/L 80 76 80  Total Bilirubin 0.3 - 1.2 mg/dL 1.5(H) 1.1 1.4(H)    Dehydration with AKI on underlying CKD 4  - with baseline creatinine of around 2.5.  He was on IV fluids, currently with some hypernatremia at 147, will encourage fluid intake, if he remains hyponatremic then will start on D5W.   -Morning he was hypotensive, altered, and with elevated creatinine yesterday, he did receive fluid bolus today.   History of CAD.  - Not NSTEMI.  Could be due to demand ischemia from hypoxia, continue aspirin, statin and beta-blocker for secondary prevention, stable echocardiogram, stable EKG and chest pain-free seen by cardiology continue present care with outpatient cardiology follow-up post discharge.  History of schizoaffective disorder -  takes monthly injection of Invega and daily on Klonopin and Cogentin.  - Follows at [facility] follow up.  -  According to the POA he has been extremely weak for the last 6 months and they were unable to take him to his psychiatrist for follow-up. -Patient medication has been adjusted yesterday, had to stop the Seroquel and daytime Klonopin given he was lethargic, this morning he is more awake, had a poor  night sleep, so I would resume him back on nighttime Seroquel.  History of pulmonary fibrosis now on 2 L home oxygen.  Continue supportive care with a liters as needed.  Outpatient pulmonary follow-up.  Anemia of chronic disease.  Stable will monitor.  Chronic diastolic CHF.  EF on recent echocardiogram was 60%.  Appears dehydrated, hold Lasix, required some IV fluid bolus yesterday given low blood pressure.  Chronically elevated D-dimers.   - Previously has had negative lower extremity venous duplex, he is on subcu heparin for DVT prophylaxis  Essential hypertension.  Now hypotensive - IVF repeat on 11/20/2019 and monitor, discontinued Coreg low-dose Lopressor if tolerated.  Hypokalemia - repleted.  Paroxysmal atrial fibrillation with [place] vas 2 score of at least 3 with RVR and Hypotension on 11/19/2019.  He was placed briefly on IV amiodarone and digoxin, case discussed with cardiologist Dr. Ellyn Hack, extremely high risk candidate for anticoagulation due to fall risk and frail status, aspirin, rate better will be placed on  with oral amiodarone.  -Metoprolol has been stopped given soft blood pressure  DM type II.  For now sliding scale, addedm low-dose Lantus as CBGs are running high due to steroid use.   Lab Results  Component Value Date   HGBA1C 7.0 (H) 09/20/2019   CBG (last 3)  Recent Labs    11/21/19 2043 11/22/19 0731 11/22/19 1131  GLUCAP 231* 178* 230*     Condition - Extremely Guarded  Family Communication  : Previous MD DW POA - [name].  [name] states that for the last 6 months he has had multiple hospitalizations and he is getting so weak that he was not able to see his psychiatrist for the last 6 months as well, however he somehow has some unrealistic  expectations of outcome and was told that this to Robert Little is a 79 year old gentleman with multiple medical problems and psychiatric problems and now has moderate to  severe Covid infection with complications arising from it which provides a poor and guarded prognosis.  Code Status :  DNR  Consults  :  Cards, Psych  Disposition Plan  :    Status is: Inpatient  Remains inpatient appropriate because:Inpatient level of care appropriate due to severity of illness  Dispo: The patient is from: Group home              Anticipated d/c is to: Group home              Anticipated d/c date is: 3 days              Patient currently is not medically stable to d/c. Covid, Afib RVR, AKI     Procedures  :    TTE - 1. Left ventricular ejection fraction, by estimation, is 60 to 65%. The left ventricle has normal function. The left ventricle has no regional wall motion abnormalities. There is mild concentric left ventricular hypertrophy. Left ventricular diastolic parameters are consistent with Grade II diastolic dysfunction  (pseudonormalization). Elevated left atrial pressure.  2. Right ventricular systolic function is normal. The right ventricular size is normal. There is normal pulmonary artery systolic pressure.  3. Left atrial size was mildly dilated.  4. The mitral valve is normal in structure. Mild mitral valve regurgitation. No evidence of mitral stenosis.  5. The aortic valve is normal in structure. Aortic valve regurgitation is not visualized. No aortic stenosis is present.  6. The inferior vena cava is normal in size with greater than 50% respiratory variability, suggesting right atrial pressure of 3 mmHg.   PUD Prophylaxis :  None  DVT Prophylaxis  :   Heparin   Lab Results  Component Value Date   PLT 258 11/21/2019    Diet :  Diet Order            DIET DYS 2 Room service appropriate? No; Fluid consistency: Nectar Thick  Diet effective now               Inpatient Medications  Scheduled Meds: . albuterol  2 puff Inhalation Q2200  . amiodarone  200 mg Oral Daily  . aspirin EC  81 mg Oral Daily  . atorvastatin  20 mg Oral QHS  .  benztropine  0.5 mg Oral BID  . Chlorhexidine Gluconate Cloth  6 each Topical Daily  . clonazePAM  0.5 mg Oral QHS  . ferrous sulfate  325 mg Oral BID WC  . fluticasone furoate-vilanterol  1 puff Inhalation Daily  . heparin injection (subcutaneous)  5,000 Units Subcutaneous Q8H  . insulin aspart  0-6 Units Subcutaneous TID WC  . insulin aspart  3 Units Subcutaneous TID WC  . lidocaine  1 application Urethral Once  . pantoprazole  40 mg Oral Daily  . tamsulosin  0.4 mg Oral Daily  . umeclidinium bromide  1 puff Inhalation Daily  . vitamin B-12  500 mcg Oral BID   Continuous Infusions:  PRN Meds:.acetaminophen, ALPRAZolam, [DISCONTINUED] ondansetron **OR** ondansetron (ZOFRAN) IV, Resource ThickenUp Clear, zolpidem  Antibiotics  :    Anti-infectives (From admission, onward)   Start     Dose/Rate Route Frequency Ordered Stop   11/13/19 1600  remdesivir 100 mg in sodium chloride 0.9 % 100 mL IVPB     100 mg 200 mL/hr  over 30 Minutes Intravenous Daily 11/12/19 2146 11/16/19 1700   11/12/19 2300  remdesivir 200 mg in sodium chloride 0.9% 250 mL IVPB     200 mg 580 mL/hr over 30 Minutes Intravenous Once 11/12/19 2146 11/13/19 0104   11/12/19 2130  cefTRIAXone (ROCEPHIN) 2 g in sodium chloride 0.9 % 100 mL IVPB  Status:  Discontinued     2 g 200 mL/hr over 30 Minutes Intravenous  Once 11/12/19 2056 11/12/19 2125   11/12/19 2130  azithromycin (ZITHROMAX) 500 mg in sodium chloride 0.9 % 250 mL IVPB  Status:  Discontinued     500 mg 250 mL/hr over 60 Minutes Intravenous  Once 11/12/19 2056 11/12/19 2125       Time Spent in minutes  30   Phillips Climes M.D on 11/22/2019 at 11:58 AM  To page go to [email or web address] - password [facility]  [facility] -  Office  [phone]  See all Orders from today for further details    Objective:   Vitals:   11/22/19 0000 11/22/19 0400 11/22/19 0900 11/22/19 1133  BP: (!) 126/56 116/84  110/87  Pulse:  (!) 104  96  Resp: (!) 25 (!) 22  20   Temp:  97.7 F (36.5 C)  97.7 F (36.5 C)  TempSrc:  Axillary  Oral  SpO2:  (!) 88% 92% 91%  Weight:      Height:        Wt Readings from Last 3 Encounters:  11/13/19 69.4 kg  10/28/19 69.4 kg  10/10/19 69.4 kg     Intake/Output Summary (Last 24 hours) at 11/22/2019 1158 Last data filed at 11/22/2019 0900 Gross per 24 hour  Intake 348.03 ml  Output 2200 ml  Net -1851.97 ml     Physical Exam  Extremely frail, chronically ill-appearing male, sitting in bed, being fed by staff his breakfast altered, unable to answer questions appropriately or follow commands . Symmetrical Chest wall movement, Good air movement bilaterally, CTAB RRR,No Gallops,Rubs or new Murmurs, No Parasternal Heave +ve B.Sounds, Abd Soft, No tenderness, No rebound - guarding or rigidity. No Cyanosis, Clubbing or edema, No new Rash or bruise       Data Review:    CBC Recent Labs  Lab 11/16/19 0409 11/18/19 0322 11/19/19 0731 11/20/19 0447 11/21/19 0832  WBC 10.7* 17.2* 14.2* 11.8* 9.5  HGB 8.7* 9.5* 9.1* 9.0* 8.1*  HCT 26.7* 28.8* 28.3* 29.2* 25.4*  PLT 258 361 324 PLATELET CLUMPS NOTED ON SMEAR, UNABLE TO ESTIMATE 258  MCV 89.0 89.7 90.1 92.7 91.0  MCH 29.0 29.6 29.0 28.6 29.0  MCHC 32.6 33.0 32.2 30.8 31.9  RDW 15.6* 15.6* 16.1* 16.5* 16.7*  LYMPHSABS 1.0 2.0  --  1.5 1.3  MONOABS 0.6 1.1*  --  0.9 0.8  EOSABS 0.0 0.0  --  0.1 0.1  BASOSABS 0.0 0.1  --  0.0 0.0    Chemistries  Recent Labs  Lab 11/16/19 0409 11/16/19 0409 11/18/19 0322 11/19/19 0731 11/20/19 0447 11/21/19 0832 11/22/19 0643  NA 142   < > 142 147* 146* 142 147*  K 4.5   < > 4.4 5.0 5.4* 2.8* 4.0  CL 108   < > 105 114* 114* 109 113*  CO2 23   < > 21* 18* 20* 22 21*  GLUCOSE 179*   < > 166* 183* 181* 216* 198*  BUN 58*   < > 93* 106* 89* 51* 44*  CREATININE 2.14*   < > 3.22* 3.59* 3.06* 2.57*  2.45*  CALCIUM 8.9   < > 9.5 9.0 9.0 8.2* 8.6*  AST 32  --  38  --  32 40 43*  ALT 25  --  31  --  25 30 34  ALKPHOS  63  --  82  --  80 76 80  BILITOT 0.9  --  1.3*  --  1.4* 1.1 1.5*  MG 1.7  --   --  1.9  --   --   --    < > = values in this interval not displayed.     ------------------------------------------------------------------------------------------------------------------ No results for input(s): CHOL, HDL, LDLCALC, TRIG, CHOLHDL, LDLDIRECT in the last 72 hours.  Lab Results  Component Value Date   HGBA1C 7.0 (H) 09/20/2019   ------------------------------------------------------------------------------------------------------------------ No results for input(s): TSH, T4TOTAL, T3FREE, THYROIDAB in the last 72 hours.  Invalid input(s): FREET3  Cardiac Enzymes No results for input(s): CKMB, TROPONINI, MYOGLOBIN in the last 168 hours.  Invalid input(s): CK ------------------------------------------------------------------------------------------------------------------    Component Value Date/Time   BNP 618.3 (H) 11/22/2019 8786    Micro Results Recent Results (from the past 240 hour(s))  SARS Coronavirus 2 by RT PCR (hospital order, performed in [facility] hospital lab) Nasopharyngeal Nasopharyngeal Swab     Status: Abnormal   Collection Time: 11/12/19  7:16 PM   Specimen: Nasopharyngeal Swab  Result Value Ref Range Status   SARS Coronavirus 2 POSITIVE (A) NEGATIVE Final    Comment: RESULT CALLED TO, READ BACK BY AND VERIFIED WITH: T KELLY RN 11/12/19 2119 JDW (NOTE) SARS-CoV-2 target nucleic acids are DETECTED SARS-CoV-2 RNA is generally detectable in upper respiratory specimens  during the acute phase of infection.  Positive results are indicative  of the presence of the identified virus, but do not rule out bacterial infection or co-infection with other pathogens not detected by the test.  Clinical correlation with patient history and  other diagnostic information is necessary to determine patient infection status.  The expected result is negative. Fact Sheet for Patients:    StrictlyIdeas.no  Fact Sheet for Healthcare Providers:   BankingDealers.co.za   This test is not yet approved or cleared by the [place] FDA and  has been authorized for detection and/or diagnosis of SARS-CoV-2 by FDA under an Emergency Use Authorization (EUA).  This EUA will remain in effect (meaning this test can be used)  for the duration of  the COVID-19 declaration under Section 564(b)(1) of the Act, 21 U.S.C. section 360-bbb-3(b)(1), unless the authorization is terminated or revoked sooner. Performed at [facility], [place] [address], [place], [place] [address]   Blood Culture (routine x 2)     Status: None   Collection Time: 11/12/19  7:16 PM   Specimen: BLOOD  Result Value Ref Range Status   Specimen Description BLOOD LEFT FOREARM  Final   Special Requests   Final    BOTTLES DRAWN AEROBIC AND ANAEROBIC Blood Culture results may not be optimal due to an excessive volume of blood received in culture bottles   Culture   Final    NO GROWTH 5 DAYS Performed at [facility], [place] [address], [place], [place] [address]    Report Status 11/17/2019 FINAL  Final  Urine culture     Status: None   Collection Time: 11/12/19  7:16 PM   Specimen: In/Out Cath Urine  Result Value Ref Range Status   Specimen Description IN/OUT CATH URINE  Final   Special Requests NONE  Final  Culture   Final    NO GROWTH Performed at [facility], [place] [address], [place], [place] [address]    Report Status 11/13/2019 FINAL  Final  Blood Culture (routine x 2)     Status: None   Collection Time: 11/12/19  7:21 PM   Specimen: BLOOD  Result Value Ref Range Status   Specimen Description BLOOD RIGHT FOREARM  Final   Special Requests   Final    BOTTLES DRAWN AEROBIC AND ANAEROBIC Blood Culture results may not be optimal due to an inadequate volume of blood received in culture bottles   Culture   Final    NO GROWTH 5 DAYS Performed at  [facility], [place] [address] [place],  [address]    Report Status 11/17/2019 FINAL  Final    Radiology Reports CT Head Wo Contrast  Result Date: 10/28/2019 CLINICAL DATA:  Un witnessed fall, weakness, confusion EXAM: CT HEAD WITHOUT CONTRAST TECHNIQUE: Contiguous axial images were obtained from the base of the skull through the vertex without intravenous contrast. COMPARISON:  None. FINDINGS: Brain: Hypodensities in the periventricular white matter, most pronounced in the left frontal region, most likely reflect chronic small vessel ischemic change. No signs of acute infarct or hemorrhage. Lateral ventricles and remaining midline structures are unremarkable. No acute extra-axial fluid collections. No mass effect. Vascular: No hyperdense vessel or unexpected calcification. Skull: Normal. Negative for fracture or focal lesion. Sinuses/Orbits: Mucoperiosteal thickening within the maxillary sinuses, right greater than left. Remaining sinuses are clear. Other: None. IMPRESSION: 1. No acute intracranial trauma. 2. Likely chronic small vessel ischemic changes within the frontal periventricular white matter. Electronically Signed   By: Randa Ngo M.D.   On: 10/28/2019 19:31   DG Chest Port 1 View  Result Date: 11/20/2019 CLINICAL DATA:  Shortness of breath, cough. EXAM: PORTABLE CHEST 1 VIEW COMPARISON:  Nov 19, 2019. FINDINGS: Stable cardiomediastinal silhouette. Sternotomy wires are noted. No pneumothorax or pleural effusion is noted. Stable fibrotic changes noted in the lung bases. No definite acute abnormality is noted. Bony thorax is unremarkable. IMPRESSION: Stable bibasilar fibrotic changes are noted. No definite acute cardiopulmonary abnormality seen. Electronically Signed   By: Marijo Conception M.D.   On: 11/20/2019 08:32   DG Chest Port 1 View  Result Date: 11/19/2019 CLINICAL DATA:  Shortness of breath EXAM: PORTABLE CHEST 1 VIEW COMPARISON:  Nov 12, 2019 chest radiograph and  chest CT September 19, 2019 FINDINGS: There is fibrotic change scattered throughout the lungs, most notably in the mid and lower lung regions, essentially stable. No new opacity evident. Heart is upper normal in size with pulmonary vascularity normal. Patient is status post coronary artery bypass grafting. No adenopathy appreciable. There is aortic atherosclerosis. No evident bone lesions. IMPRESSION: Stable fibrotic type change, most severe in the mid and lower lung regions. No new opacity evident. Stable cardiac silhouette. Postoperative changes noted. Aortic Atherosclerosis (ICD10-I70.0). Electronically Signed   By: Lowella Grip III M.D.   On: 11/19/2019 08:24   DG Chest Port 1 View  Result Date: 11/12/2019 CLINICAL DATA:  Shortness of breath and sepsis. Fever. Productive cough. EXAM: PORTABLE CHEST 1 VIEW COMPARISON:  10/28/2019.  09/19/2019. FINDINGS: The patient has a history of pulmonary fibrosis and emphysema. Chronic lung markings appear similar allowing for technical differences. Upper lobe emphysema as seen previously. No sign of dense consolidation, collapse or effusion. Previous median sternotomy and CABG. Aortic atherosclerosis. No acute bone finding. IMPRESSION: Portable film with the poor  inspiration. Chronic fibrotic lung disease and upper lung emphysema. No sign of acute infiltrate, collapse or effusion. Electronically Signed   By: Nelson Chimes M.D.   On: 11/12/2019 19:37   DG Chest Portable 1 View  Result Date: 10/28/2019 CLINICAL DATA:  79 year old male with altered mental status. EXAM: PORTABLE CHEST 1 VIEW COMPARISON:  Chest radiograph dated 09/19/2019 and CT dated 09/19/2019 FINDINGS: Diffuse interstitial scarring in keeping with fibrosis. No new consolidation. There is no pleural effusion or pneumothorax. Stable cardiomediastinal silhouette. Atherosclerotic calcification of the aorta. Median sternotomy wires and CABG vascular clips. IMPRESSION: 1. No acute cardiopulmonary process. 2.  Fibrotic changes. Electronically Signed   By: Anner Crete M.D.   On: 10/28/2019 21:16   ECHOCARDIOGRAM COMPLETE  Result Date: 11/14/2019    ECHOCARDIOGRAM REPORT   Patient Name:   Robert Little Date of Exam: 11/14/2019 Medical Rec #:  [id number]    Height:       68.0 in Accession #:    [id number]   Weight:       153.0 lb Date of Birth:  Dec 20, 1940    BSA:          1.824 m Patient Age:    26 years     BP:           131/61 mmHg Patient Gender: M            HR:           58 bpm. Exam Location:  Inpatient Procedure: 2D Echo, Cardiac Doppler and Color Doppler Indications:    Elevated Troponin  History:        Patient has prior history of Echocardiogram examinations, most                 recent 09/06/2019. CHF, CAD; Risk Factors:Hypertension,                 Dyslipidemia and Former Smoker.  Sonographer:    Vickie Epley RDCS Referring Phys: [id number] RHONDA G BARRETT  Sonographer Comments: Covid positive. IMPRESSIONS  1. Left ventricular ejection fraction, by estimation, is 60 to 65%. The left ventricle has normal function. The left ventricle has no regional wall motion abnormalities. There is mild concentric left ventricular hypertrophy. Left ventricular diastolic parameters are consistent with Grade II diastolic dysfunction (pseudonormalization). Elevated left atrial pressure.  2. Right ventricular systolic function is normal. The right ventricular size is normal. There is normal pulmonary artery systolic pressure.  3. Left atrial size was mildly dilated.  4. The mitral valve is normal in structure. Mild mitral valve regurgitation. No evidence of mitral stenosis.  5. The aortic valve is normal in structure. Aortic valve regurgitation is not visualized. No aortic stenosis is present.  6. The inferior vena cava is normal in size with greater than 50% respiratory variability, suggesting right atrial pressure of 3 mmHg. FINDINGS  Left Ventricle: Left ventricular ejection fraction, by estimation, is 60 to 65%. The left  ventricle has normal function. The left ventricle has no regional wall motion abnormalities. The left ventricular internal cavity size was normal in size. There is  mild concentric left ventricular hypertrophy. Left ventricular diastolic parameters are consistent with Grade II diastolic dysfunction (pseudonormalization). Elevated left atrial pressure. Right Ventricle: The right ventricular size is normal. No increase in right ventricular wall thickness. Right ventricular systolic function is normal. There is normal pulmonary artery systolic pressure. The tricuspid regurgitant velocity is 2.25 m/s, and  with an assumed right atrial pressure of 3 mmHg,  the estimated right ventricular systolic pressure is 44.6 mmHg. Left Atrium: Left atrial size was mildly dilated. Right Atrium: Right atrial size was normal in size. Pericardium: There is no evidence of pericardial effusion. Mitral Valve: The mitral valve is normal in structure. Normal mobility of the mitral valve leaflets. Mild mitral valve regurgitation. No evidence of mitral valve stenosis. Tricuspid Valve: The tricuspid valve is normal in structure. Tricuspid valve regurgitation is not demonstrated. No evidence of tricuspid stenosis. Aortic Valve: The aortic valve is normal in structure. Aortic valve regurgitation is not visualized. No aortic stenosis is present. Pulmonic Valve: The pulmonic valve was normal in structure. Pulmonic valve regurgitation is not visualized. No evidence of pulmonic stenosis. Aorta: The aortic root is normal in size and structure. Venous: The inferior vena cava is normal in size with greater than 50% respiratory variability, suggesting right atrial pressure of 3 mmHg. IAS/Shunts: No atrial level shunt detected by color flow Doppler.  LEFT VENTRICLE PLAX 2D LVIDd:         4.70 cm      Diastology LVIDs:         3.42 cm      LV e' lateral:   7.14 cm/s LV PW:         0.80 cm      LV E/e' lateral: 12.3 LV IVS:        0.80 cm      LV e' medial:     4.62 cm/s LVOT diam:     1.90 cm      LV E/e' medial:  19.0 LV SV:         45 LV SV Index:   25 LVOT Area:     2.84 cm  LV Volumes (MOD) LV vol d, MOD A2C: 103.0 ml LV vol d, MOD A4C: 115.0 ml LV vol s, MOD A2C: 61.9 ml LV vol s, MOD A4C: 46.6 ml LV SV MOD A2C:     41.1 ml LV SV MOD A4C:     115.0 ml LV SV MOD BP:      57.4 ml RIGHT VENTRICLE RV S prime:     10.10 cm/s TAPSE (M-mode): 1.6 cm LEFT ATRIUM             Index       RIGHT ATRIUM           Index LA diam:        4.20 cm 2.30 cm/m  RA Area:     10.30 cm LA Vol (A2C):   24.4 ml 13.38 ml/m RA Volume:   18.10 ml  9.92 ml/m LA Vol (A4C):   33.3 ml 18.26 ml/m LA Biplane Vol: 31.4 ml 17.22 ml/m  AORTIC VALVE LVOT Vmax:   70.00 cm/s LVOT Vmean:  40.000 cm/s LVOT VTI:    0.160 m  AORTA Ao Root diam: 3.10 cm MITRAL VALVE               TRICUSPID VALVE MV Area (PHT): 5.02 cm    TR Peak grad:   20.2 mmHg MV Decel Time: 151 msec    TR Vmax:        225.00 cm/s MV E velocity: 87.80 cm/s MV A velocity: 70.30 cm/s  SHUNTS MV E/A ratio:  1.25        Systemic VTI:  0.16 m  Systemic Diam: 1.90 cm Ena Dawley MD Electronically signed by Ena Dawley MD Signature Date/Time: 11/14/2019/1:13:59 PM    Final

## 2019-11-22 NOTE — Progress Notes (Signed)
Occupational Therapy Treatment Patient Details Name: Robert Little MRN: [id number] DOB: [birth year] Today's Date: 11/22/2019    History of present illness 79 y.o. male with medical history significant for chronic anemia, pulmonary fibrosis, hypertension, coronary artery disease, chronic diastolic CHF, and schizoaffective disorder, presenting to the emergency department with progressive shortness of breath long with severe orthopnea starting 1 to 2 days prior to hospital visit, in the hospital work-up suggestive of CHF and acute hypoxic respiratory failure and he was admitted 09/16/19   OT comments  Patient supine in bed on arrival.  He was more alert and responsive today that in previous session.  Able to follow simple commands 25% of time.  He required mod assist to go supine to sit.  Used L UE to support self and fluctuated between needing min guard-mod assist to maintain seated balance.  Worked on improving static balance in preparation for functional mobility and ADLs.  Patient completed some eating with min assist, needing help with getting utensil/drink in hand.  Attention is poor and needs max verbal cues to participate.  Will continue to follow with OT acutely to address the deficits listed below.    Follow Up Recommendations  SNF;Supervision/Assistance - 24 hour    Equipment Recommendations  None recommended by OT    Recommendations for Other Services      Precautions / Restrictions Precautions Precautions: Fall;Other (comment) Precaution Comments: monitor O2 Restrictions Weight Bearing Restrictions: No       Mobility Bed Mobility Overal bed mobility: Needs Assistance Bed Mobility: Supine to Sit;Sit to Supine     Supine to sit: Mod assist;HOB elevated Sit to supine: Max assist;HOB elevated      Transfers                 General transfer comment: Did not attempt    Balance Overall balance assessment: Needs assistance Sitting-balance support: Feet  supported;Single extremity supported Sitting balance-Leahy Scale: Poor                                     ADL either performed or assessed with clinical judgement   ADL Overall ADL's : Needs assistance/impaired Eating/Feeding: Minimal assistance;Sitting                                   Functional mobility during ADLs: Maximal assistance General ADL Comments: Worked on static seated balance for to prep for functional mobility and ADLs     Vision       Perception     Praxis      Cognition Arousal/Alertness: Awake/alert Behavior During Therapy: Restless;Agitated Overall Cognitive Status: History of cognitive impairments - at baseline                                 General Comments: Patient in mitten restraints. more alert than previous visit and responding to some commands.  Able to follow simple commands 25% of time.  Patient was restless though and trying to get out of bed and stand while at EOB.          Exercises Exercises: Other exercises Other Exercises Other Exercises: 10 min EOB sit   Shoulder Instructions       General Comments      Pertinent Vitals/ Pain  Pain Assessment: Faces Faces Pain Scale: Hurts little more Pain Location: General Pain Descriptors / Indicators: Discomfort Pain Intervention(s): Repositioned  Home Living                                          Prior Functioning/Environment              Frequency  Min 2X/week        Progress Toward Goals  OT Goals(current goals can now be found in the care plan section)  Progress towards OT goals: Progressing toward goals  Acute Rehab OT Goals Patient Stated Goal: Not stated OT Goal Formulation: Patient unable to participate in goal setting Time For Goal Achievement: 12/03/19 Potential to Achieve Goals: [place] Discharge plan remains appropriate    Co-evaluation                 AM-PAC OT "6 Clicks"  Daily Activity     Outcome Measure   Help from another person eating meals?: A Lot Help from another person taking care of personal grooming?: A Lot Help from another person toileting, which includes using toliet, bedpan, or urinal?: Total Help from another person bathing (including washing, rinsing, drying)?: Total Help from another person to put on and taking off regular upper body clothing?: A Lot Help from another person to put on and taking off regular lower body clothing?: Total 6 Click Score: 9    End of Session Equipment Utilized During Treatment: Oxygen  OT Visit Diagnosis: Unsteadiness on feet (R26.81);Muscle weakness (generalized) (M62.81);Other symptoms and signs involving cognitive function   Activity Tolerance Patient limited by lethargy;Treatment limited secondary to agitation   Patient Left in bed;with bed alarm set;with call bell/phone within reach;with restraints reapplied   Nurse Communication Mobility status        Time: 4081-4481 OT Time Calculation (min): 23 min  Charges: OT General Charges $OT Visit: 1 Visit OT Treatments $Self Care/Home Management : 8-22 mins $Therapeutic Activity: 8-22 mins  [name], OTR/L    [name] 11/22/2019, 5:01 PM

## 2019-11-22 NOTE — Progress Notes (Signed)
Patient restless throughout the night, prn Ambien given with no changes. Prn xanax given with slight improvement in restlessness. Patient resting at this time.

## 2019-11-23 DIAGNOSIS — E870 Hyperosmolality and hypernatremia: Secondary | ICD-10-CM

## 2019-11-23 LAB — CBC WITH DIFFERENTIAL/PLATELET
Abs Immature Granulocytes: 0.46 10*3/uL — ABNORMAL HIGH (ref 0.00–0.07)
Basophils Absolute: 0 10*3/uL (ref 0.0–0.1)
Basophils Relative: 0 %
Eosinophils Absolute: 0.2 10*3/uL (ref 0.0–0.5)
Eosinophils Relative: 2 %
HCT: 31.7 % — ABNORMAL LOW (ref 39.0–52.0)
Hemoglobin: 9.7 g/dL — ABNORMAL LOW (ref 13.0–17.0)
Immature Granulocytes: 5 %
Lymphocytes Relative: 12 %
Lymphs Abs: 1.2 10*3/uL (ref 0.7–4.0)
MCH: 28.6 pg (ref 26.0–34.0)
MCHC: 30.6 g/dL (ref 30.0–36.0)
MCV: 93.5 fL (ref 80.0–100.0)
Monocytes Absolute: 0.8 10*3/uL (ref 0.1–1.0)
Monocytes Relative: 8 %
Neutro Abs: 7.5 10*3/uL (ref 1.7–7.7)
Neutrophils Relative %: 73 %
Platelets: UNDETERMINED 10*3/uL (ref 150–400)
RBC: 3.39 MIL/uL — ABNORMAL LOW (ref 4.22–5.81)
RDW: 17.1 % — ABNORMAL HIGH (ref 11.5–15.5)
WBC: 10.2 10*3/uL (ref 4.0–10.5)
nRBC: 0 % (ref 0.0–0.2)

## 2019-11-23 LAB — COMPREHENSIVE METABOLIC PANEL
ALT: 30 U/L (ref 0–44)
AST: 25 U/L (ref 15–41)
Albumin: 2.5 g/dL — ABNORMAL LOW (ref 3.5–5.0)
Alkaline Phosphatase: 80 U/L (ref 38–126)
Anion gap: 11 (ref 5–15)
BUN: 36 mg/dL — ABNORMAL HIGH (ref 8–23)
CO2: 23 mmol/L (ref 22–32)
Calcium: 8.7 mg/dL — ABNORMAL LOW (ref 8.9–10.3)
Chloride: 115 mmol/L — ABNORMAL HIGH (ref 98–111)
Creatinine, Ser: 2.34 mg/dL — ABNORMAL HIGH (ref 0.61–1.24)
GFR calc Af Amer: 30 mL/min — ABNORMAL LOW (ref >60)
GFR calc non Af Amer: 26 mL/min — ABNORMAL LOW (ref >60)
Glucose, Bld: 174 mg/dL — ABNORMAL HIGH (ref 70–99)
Potassium: 3.9 mmol/L (ref 3.5–5.1)
Sodium: 149 mmol/L — ABNORMAL HIGH (ref 135–145)
Total Bilirubin: 1.4 mg/dL — ABNORMAL HIGH (ref 0.3–1.2)
Total Protein: 6.3 g/dL — ABNORMAL LOW (ref 6.5–8.1)

## 2019-11-23 LAB — GLUCOSE, CAPILLARY
Glucose-Capillary: 163 mg/dL — ABNORMAL HIGH (ref 70–99)
Glucose-Capillary: 214 mg/dL — ABNORMAL HIGH (ref 70–99)
Glucose-Capillary: 195 mg/dL — ABNORMAL HIGH (ref 70–99)
Glucose-Capillary: 180 mg/dL — ABNORMAL HIGH (ref 70–99)

## 2019-11-23 LAB — D-DIMER, QUANTITATIVE: D-Dimer, Quant: 1.36 ug/mL-FEU — ABNORMAL HIGH (ref 0.00–0.50)

## 2019-11-23 LAB — C-REACTIVE PROTEIN: CRP: 12.1 mg/dL — ABNORMAL HIGH (ref <1.0)

## 2019-11-23 LAB — BRAIN NATRIURETIC PEPTIDE: B Natriuretic Peptide: 502.6 pg/mL — ABNORMAL HIGH (ref 0.0–100.0)

## 2019-11-23 MED ORDER — METOPROLOL TARTRATE 5 MG/5ML IV SOLN
5.0000 mg | INTRAVENOUS | Status: DC | PRN
  Administered 2019-11-23: 5 mg via INTRAVENOUS
  Filled 2019-11-23: qty 5

## 2019-11-23 MED ORDER — POTASSIUM CL IN DEXTROSE 5% 20 MEQ/L IV SOLN
20.0000 meq | INTRAVENOUS | Status: DC
  Administered 2019-11-23 – 2019-11-24 (×3): 20 meq via INTRAVENOUS
  Filled 2019-11-23 (×5): qty 1000

## 2019-11-23 NOTE — Plan of Care (Signed)

## 2019-11-23 NOTE — Progress Notes (Signed)
PROGRESS NOTE                                                                                                                                                                                                             Patient Demographics:    Robert Little, is a 79 y.o. male, DOB - 1940-12-12, OEV:[id number]  Outpatient Primary MD for the patient is [facility], [facility]    LOS - 11  Admit date - 11/12/2019    Chief Complaint  Patient presents with  . Shortness of Breath       Brief Narrative   -  Robert Little is a 79 y.o. male with history of his affective disorder, CAD, pulmonary fibrosis on home oxygen usually 2 L anemia hypertension diabetes mellitus recently admitted for hypoglycemia with use of sulfonylureas in the setting of chronic kidney disease was brought to the ER after patient has been having productive cough over the last 3 days feeling weak and tired not eating well.  ER he was diagnosed with acute hypoxic respiratory failure due to COVID-19 pneumonia, dehydration AKI and NSTEMI.  Was complicated by runs of A. fib with RVR, dehydration, hypotension and AKI.   Subjective:   Patient in bed, he himself denies any complaints, no significant events as discussed with staff.   Assessment  & Plan :   Sepsis with Acute Hypoxic Resp. Failure due to Acute Covid 19 Viral Pneumonitis during the ongoing 2020 Covid 19 Pandemic  -Patient is with moderate disease,. -He was treated with IV remdesivir. -He was treated with IV steroids as well. -Currently hypoxia back to baseline, he is on 2 L nasal cannula at baseline. -Overall extremely frail, deconditioned, high risk for decompensation. -CRP and D-dimers remains significantly elevated, continue to trend and monitor closely. -Patient extremely frail and debilitated, not able to comply fully with incentive spirometry, and out of bed to chair.   SpO2: 100 % O2  Flow Rate (L/min): 2 L/min  Recent Labs  Lab 11/19/19 0731 11/19/19 0731 11/20/19 0447 11/21/19 0832 11/22/19 0643 11/22/19 1346 11/23/19 0622  CRP 4.3*  --  5.2* 10.7* 12.0*  --  12.1*  DDIMER 1.48*  --  1.11* 2.00* 2.40*  --  1.36*  BNP 559.3*   < > 232.8* 396.7* 618.3* 749.8* 502.6*  PROCALCITON <0.10  --  0.12 0.19  --   --   --    < > = values in this interval not displayed.    Hepatic Function Latest Ref Rng & Units 11/23/2019 11/22/2019 11/21/2019  Total Protein 6.5 - 8.1 g/dL 6.3(L) 6.1(L) 5.7(L)  Albumin 3.5 - 5.0 g/dL 2.5(L) 2.7(L) 2.6(L)  AST 15 - 41 U/L 25 43(H) 40  ALT 0 - 44 U/L 30 34 30  Alk Phosphatase 38 - 126 U/L 80 80 76  Total Bilirubin 0.3 - 1.2 mg/dL 1.4(H) 1.5(H) 1.1    Dehydration with AKI on underlying CKD 4  -Baseline creatinine 2.5, at some point he had worsening renal function, and some evidence of volume depletion and low blood pressure, this has responded to fluid bolus. -Currently he is on D5W especially in the setting of hypernatremia.  Hypernatremia -Remains with poor oral intake, will start on D5W for sodium of 149, monitor BMP closely.   History of CAD.  - Not NSTEMI.  Could be due to demand ischemia from hypoxia, continue aspirin, statin and beta-blocker for secondary prevention, stable echocardiogram, stable EKG and chest pain-free seen by cardiology continue present care with outpatient cardiology follow-up post discharge.  History of schizoaffective disorder -  takes monthly injection of Invega and daily on Klonopin and Cogentin.  - Follows at [facility] follow up.  -  According to the POA he has been extremely weak for the last 6 months and they were unable to take him to his psychiatrist for follow-up. -He is back on his Seroquel (held secondary to sedation couple days ago), and Klonopin dose has been lowered to nightly only. -We will go ahead and discontinue his Cogentin as he did not receive his Invega  for last 4  months.  History of pulmonary fibrosis now on 2 L home oxygen.  - Continue supportive care with a liters as needed.  Outpatient pulmonary follow-up.  Anemia of chronic disease. -  Stable will monitor.  Chronic diastolic CHF.  EF on recent echocardiogram was 60%.  Appears dehydrated, hold Lasix, continue with IV fluids .  Chronically elevated D-dimers.   - Previously has had negative lower extremity venous duplex, he is on subcu heparin for DVT prophylaxis  Essential hypertension.  Now hypotensive - IVF repeat on 11/20/2019 and monitor, discontinued Coreg low-dose Lopressor if tolerated.  Hypokalemia - repleted.  Paroxysmal atrial fibrillation with [place] vas 2 score of at least 3 with RVR and Hypotension  - was placed briefly on IV amiodarone and digoxin, case discussed with cardiologist Dr. Ellyn Hack, extremely high risk candidate for anticoagulation due to fall risk and frail status, aspirin, rate better will be placed on  with oral amiodarone.  -Metoprolol has been stopped given soft blood pressure, but he does appear to be in A. fib with RVR today, so I have ordered as needed metoprolol for him, as well he was started on IV fluids which should hopefully help with heart rate and blood pressure.  DM type II.  For now sliding scale, addedm low-dose Lantus as CBGs are running high due to steroid use.   Lab Results  Component Value Date   HGBA1C 7.0 (H) 09/20/2019   CBG (last 3)  Recent Labs    11/22/19 1558 11/22/19 2016 11/23/19 0806  GLUCAP 201* 144* 163*     Condition - Extremely Guarded  Family Communication  : Discussed with POA Mr. Robert Little and have updated him today, I have told him overall condition remains very tenuous in  this patient with multiple comorbidities, poor baseline, with underlining schizophrenia and severe deconditioning.  But for now we will continue with current medical measures to address his heart rate, blood pressure, and multiple  comorbidities. .  Code Status :  DNR  Consults  :  Cards, Psych  Disposition Plan  :    Status is: Inpatient  Remains inpatient appropriate because:Inpatient level of care appropriate due to severity of illness  Dispo: The patient is from: Group home              Anticipated d/c is to: Group home              Anticipated d/c date is: 3 days              Patient currently is not medically stable to d/c. Covid, Afib RVR, AKI     Procedures  :    TTE - 1. Left ventricular ejection fraction, by estimation, is 60 to 65%. The left ventricle has normal function. The left ventricle has no regional wall motion abnormalities. There is mild concentric left ventricular hypertrophy. Left ventricular diastolic parameters are consistent with Grade II diastolic dysfunction  (pseudonormalization). Elevated left atrial pressure.  2. Right ventricular systolic function is normal. The right ventricular size is normal. There is normal pulmonary artery systolic pressure.  3. Left atrial size was mildly dilated.  4. The mitral valve is normal in structure. Mild mitral valve regurgitation. No evidence of mitral stenosis.  5. The aortic valve is normal in structure. Aortic valve regurgitation is not visualized. No aortic stenosis is present.  6. The inferior vena cava is normal in size with greater than 50% respiratory variability, suggesting right atrial pressure of 3 mmHg.   PUD Prophylaxis :  None  DVT Prophylaxis  :   Heparin   Lab Results  Component Value Date   PLT PLATELET CLUMPS NOTED ON SMEAR, UNABLE TO ESTIMATE 11/23/2019    Diet :  Diet Order            DIET DYS 2 Room service appropriate? No; Fluid consistency: Nectar Thick  Diet effective now               Inpatient Medications  Scheduled Meds: . albuterol  2 puff Inhalation Q2200  . amiodarone  200 mg Oral Daily  . aspirin EC  81 mg Oral Daily  . atorvastatin  20 mg Oral QHS  . benztropine  0.5 mg Oral BID  .  Chlorhexidine Gluconate Cloth  6 each Topical Daily  . clonazePAM  0.5 mg Oral QHS  . ferrous sulfate  325 mg Oral BID WC  . fluticasone furoate-vilanterol  1 puff Inhalation Daily  . heparin injection (subcutaneous)  5,000 Units Subcutaneous Q8H  . insulin aspart  0-6 Units Subcutaneous TID WC  . insulin aspart  3 Units Subcutaneous TID WC  . lidocaine  1 application Urethral Once  . pantoprazole  40 mg Oral Daily  . QUEtiapine  25 mg Oral QHS  . tamsulosin  0.4 mg Oral Daily  . umeclidinium bromide  1 puff Inhalation Daily  . vitamin B-12  500 mcg Oral BID   Continuous Infusions: . dextrose 5 % with KCl 20 mEq / L 20 mEq (11/23/19 0807)   PRN Meds:.acetaminophen, ALPRAZolam, metoprolol tartrate, [DISCONTINUED] ondansetron **OR** ondansetron (ZOFRAN) IV, Resource ThickenUp Clear, zolpidem  Antibiotics  :    Anti-infectives (From admission, onward)   Start     Dose/Rate Route Frequency Ordered  Stop   11/13/19 1600  remdesivir 100 mg in sodium chloride 0.9 % 100 mL IVPB     100 mg 200 mL/hr over 30 Minutes Intravenous Daily 11/12/19 2146 11/16/19 1700   11/12/19 2300  remdesivir 200 mg in sodium chloride 0.9% 250 mL IVPB     200 mg 580 mL/hr over 30 Minutes Intravenous Once 11/12/19 2146 11/13/19 0104   11/12/19 2130  cefTRIAXone (ROCEPHIN) 2 g in sodium chloride 0.9 % 100 mL IVPB  Status:  Discontinued     2 g 200 mL/hr over 30 Minutes Intravenous  Once 11/12/19 2056 11/12/19 2125   11/12/19 2130  azithromycin (ZITHROMAX) 500 mg in sodium chloride 0.9 % 250 mL IVPB  Status:  Discontinued     500 mg 250 mL/hr over 60 Minutes Intravenous  Once 11/12/19 2056 11/12/19 2125       Time Spent in minutes  30   Phillips Climes M.D on 11/23/2019 at 11:46 AM  To page go to [email or web address] - password [facility]  [facility] -  Office  [phone]  See all Orders from today for further details    Objective:   Vitals:   11/23/19 0810 11/23/19 0902 11/23/19 1009 11/23/19 1114   BP:  100/84 (!) 91/52 102/70  Pulse:  (!) 125 (!) 115 (!) 110  Resp:  (!) 37 (!) 31 (!) 29  Temp:  (!) 96.5 F (35.8 C) (!) 97.4 F (36.3 C) (!) 97.4 F (36.3 C)  TempSrc: Axillary Axillary Rectal Rectal  SpO2:  96% 98% 100%  Weight:      Height:        Wt Readings from Last 3 Encounters:  11/13/19 69.4 kg  10/28/19 69.4 kg  10/10/19 69.4 kg     Intake/Output Summary (Last 24 hours) at 11/23/2019 1146 Last data filed at 11/23/2019 9381 Gross per 24 hour  Intake --  Output 1285 ml  Net -1285 ml     Physical Exam  Extremely frail, chronically ill-appearing male, laying in bed, altered, slow to respond . Symmetrical Chest wall movement, Good air movement bilaterally, CTAB Irregular irregular ,no Gallops,Rubs or new Murmurs, No Parasternal Heave +ve B.Sounds, Abd Soft, No tenderness, No rebound - guarding or rigidity. No Cyanosis, Clubbing or edema, No new Rash or bruise       Data Review:    CBC Recent Labs  Lab 11/18/19 0322 11/19/19 0731 11/20/19 0447 11/21/19 0832 11/23/19 0622  WBC 17.2* 14.2* 11.8* 9.5 10.2  HGB 9.5* 9.1* 9.0* 8.1* 9.7*  HCT 28.8* 28.3* 29.2* 25.4* 31.7*  PLT 361 324 PLATELET CLUMPS NOTED ON SMEAR, UNABLE TO ESTIMATE 258 PLATELET CLUMPS NOTED ON SMEAR, UNABLE TO ESTIMATE  MCV 89.7 90.1 92.7 91.0 93.5  MCH 29.6 29.0 28.6 29.0 28.6  MCHC 33.0 32.2 30.8 31.9 30.6  RDW 15.6* 16.1* 16.5* 16.7* 17.1*  LYMPHSABS 2.0  --  1.5 1.3 1.2  MONOABS 1.1*  --  0.9 0.8 0.8  EOSABS 0.0  --  0.1 0.1 0.2  BASOSABS 0.1  --  0.0 0.0 0.0    Chemistries  Recent Labs  Lab 11/18/19 0322 11/18/19 0322 11/19/19 0731 11/20/19 0447 11/21/19 0832 11/22/19 0643 11/23/19 0622  NA 142   < > 147* 146* 142 147* 149*  K 4.4   < > 5.0 5.4* 2.8* 4.0 3.9  CL 105   < > 114* 114* 109 113* 115*  CO2 21*   < > 18* 20* 22 21* 23  GLUCOSE 166*   < >  183* 181* 216* 198* 174*  BUN 93*   < > 106* 89* 51* 44* 36*  CREATININE 3.22*   < > 3.59* 3.06* 2.57* 2.45* 2.34*   CALCIUM 9.5   < > 9.0 9.0 8.2* 8.6* 8.7*  AST 38  --   --  32 40 43* 25  ALT 31  --   --  25 30 34 30  ALKPHOS 82  --   --  80 76 80 80  BILITOT 1.3*  --   --  1.4* 1.1 1.5* 1.4*  MG  --   --  1.9  --   --   --   --    < > = values in this interval not displayed.     ------------------------------------------------------------------------------------------------------------------ No results for input(s): CHOL, HDL, LDLCALC, TRIG, CHOLHDL, LDLDIRECT in the last 72 hours.  Lab Results  Component Value Date   HGBA1C 7.0 (H) 09/20/2019   ------------------------------------------------------------------------------------------------------------------ No results for input(s): TSH, T4TOTAL, T3FREE, THYROIDAB in the last 72 hours.  Invalid input(s): FREET3  Cardiac Enzymes No results for input(s): CKMB, TROPONINI, MYOGLOBIN in the last 168 hours.  Invalid input(s): CK ------------------------------------------------------------------------------------------------------------------    Component Value Date/Time   BNP 502.6 (H) 11/23/2019 8453    Micro Results No results found for this or any previous visit (from the past 240 hour(s)).  Radiology Reports CT Head Wo Contrast  Result Date: 10/28/2019 CLINICAL DATA:  Un witnessed fall, weakness, confusion EXAM: CT HEAD WITHOUT CONTRAST TECHNIQUE: Contiguous axial images were obtained from the base of the skull through the vertex without intravenous contrast. COMPARISON:  None. FINDINGS: Brain: Hypodensities in the periventricular white matter, most pronounced in the left frontal region, most likely reflect chronic small vessel ischemic change. No signs of acute infarct or hemorrhage. Lateral ventricles and remaining midline structures are unremarkable. No acute extra-axial fluid collections. No mass effect. Vascular: No hyperdense vessel or unexpected calcification. Skull: Normal. Negative for fracture or focal lesion. Sinuses/Orbits:  Mucoperiosteal thickening within the maxillary sinuses, right greater than left. Remaining sinuses are clear. Other: None. IMPRESSION: 1. No acute intracranial trauma. 2. Likely chronic small vessel ischemic changes within the frontal periventricular white matter. Electronically Signed   By: Randa Ngo M.D.   On: 10/28/2019 19:31   DG Chest Port 1 View  Result Date: 11/20/2019 CLINICAL DATA:  Shortness of breath, cough. EXAM: PORTABLE CHEST 1 VIEW COMPARISON:  Nov 19, 2019. FINDINGS: Stable cardiomediastinal silhouette. Sternotomy wires are noted. No pneumothorax or pleural effusion is noted. Stable fibrotic changes noted in the lung bases. No definite acute abnormality is noted. Bony thorax is unremarkable. IMPRESSION: Stable bibasilar fibrotic changes are noted. No definite acute cardiopulmonary abnormality seen. Electronically Signed   By: Marijo Conception M.D.   On: 11/20/2019 08:32   DG Chest Port 1 View  Result Date: 11/19/2019 CLINICAL DATA:  Shortness of breath EXAM: PORTABLE CHEST 1 VIEW COMPARISON:  Nov 12, 2019 chest radiograph and chest CT September 19, 2019 FINDINGS: There is fibrotic change scattered throughout the lungs, most notably in the mid and lower lung regions, essentially stable. No new opacity evident. Heart is upper normal in size with pulmonary vascularity normal. Patient is status post coronary artery bypass grafting. No adenopathy appreciable. There is aortic atherosclerosis. No evident bone lesions. IMPRESSION: Stable fibrotic type change, most severe in the mid and lower lung regions. No new opacity evident. Stable cardiac silhouette. Postoperative changes noted. Aortic Atherosclerosis (ICD10-I70.0). Electronically Signed   By: Lowella Grip  III M.D.   On: 11/19/2019 08:24   DG Chest Port 1 View  Result Date: 11/12/2019 CLINICAL DATA:  Shortness of breath and sepsis. Fever. Productive cough. EXAM: PORTABLE CHEST 1 VIEW COMPARISON:  10/28/2019.  09/19/2019. FINDINGS: The  patient has a history of pulmonary fibrosis and emphysema. Chronic lung markings appear similar allowing for technical differences. Upper lobe emphysema as seen previously. No sign of dense consolidation, collapse or effusion. Previous median sternotomy and CABG. Aortic atherosclerosis. No acute bone finding. IMPRESSION: Portable film with the poor inspiration. Chronic fibrotic lung disease and upper lung emphysema. No sign of acute infiltrate, collapse or effusion. Electronically Signed   By: Nelson Chimes M.D.   On: 11/12/2019 19:37   DG Chest Portable 1 View  Result Date: 10/28/2019 CLINICAL DATA:  79 year old male with altered mental status. EXAM: PORTABLE CHEST 1 VIEW COMPARISON:  Chest radiograph dated 09/19/2019 and CT dated 09/19/2019 FINDINGS: Diffuse interstitial scarring in keeping with fibrosis. No new consolidation. There is no pleural effusion or pneumothorax. Stable cardiomediastinal silhouette. Atherosclerotic calcification of the aorta. Median sternotomy wires and CABG vascular clips. IMPRESSION: 1. No acute cardiopulmonary process. 2. Fibrotic changes. Electronically Signed   By: Anner Crete M.D.   On: 10/28/2019 21:16   ECHOCARDIOGRAM COMPLETE  Result Date: 11/14/2019    ECHOCARDIOGRAM REPORT   Patient Name:   LEWIE DEMAN Date of Exam: 11/14/2019 Medical Rec #:  [id number]    Height:       68.0 in Accession #:    [id number]   Weight:       153.0 lb Date of Birth:  [birth year]    BSA:          1.824 m Patient Age:    58 years     BP:           131/61 mmHg Patient Gender: M            HR:           58 bpm. Exam Location:  Inpatient Procedure: 2D Echo, Cardiac Doppler and Color Doppler Indications:    Elevated Troponin  History:        Patient has prior history of Echocardiogram examinations, most                 recent 09/06/2019. CHF, CAD; Risk Factors:Hypertension,                 Dyslipidemia and Former Smoker.  Sonographer:    Vickie Epley RDCS Referring Phys: [id number] RHONDA G BARRETT   Sonographer Comments: Covid positive. IMPRESSIONS  1. Left ventricular ejection fraction, by estimation, is 60 to 65%. The left ventricle has normal function. The left ventricle has no regional wall motion abnormalities. There is mild concentric left ventricular hypertrophy. Left ventricular diastolic parameters are consistent with Grade II diastolic dysfunction (pseudonormalization). Elevated left atrial pressure.  2. Right ventricular systolic function is normal. The right ventricular size is normal. There is normal pulmonary artery systolic pressure.  3. Left atrial size was mildly dilated.  4. The mitral valve is normal in structure. Mild mitral valve regurgitation. No evidence of mitral stenosis.  5. The aortic valve is normal in structure. Aortic valve regurgitation is not visualized. No aortic stenosis is present.  6. The inferior vena cava is normal in size with greater than 50% respiratory variability, suggesting right atrial pressure of 3 mmHg. FINDINGS  Left Ventricle: Left ventricular ejection fraction, by estimation, is 60 to 65%. The left ventricle has normal  function. The left ventricle has no regional wall motion abnormalities. The left ventricular internal cavity size was normal in size. There is  mild concentric left ventricular hypertrophy. Left ventricular diastolic parameters are consistent with Grade II diastolic dysfunction (pseudonormalization). Elevated left atrial pressure. Right Ventricle: The right ventricular size is normal. No increase in right ventricular wall thickness. Right ventricular systolic function is normal. There is normal pulmonary artery systolic pressure. The tricuspid regurgitant velocity is 2.25 m/s, and  with an assumed right atrial pressure of 3 mmHg, the estimated right ventricular systolic pressure is 93.7 mmHg. Left Atrium: Left atrial size was mildly dilated. Right Atrium: Right atrial size was normal in size. Pericardium: There is no evidence of pericardial  effusion. Mitral Valve: The mitral valve is normal in structure. Normal mobility of the mitral valve leaflets. Mild mitral valve regurgitation. No evidence of mitral valve stenosis. Tricuspid Valve: The tricuspid valve is normal in structure. Tricuspid valve regurgitation is not demonstrated. No evidence of tricuspid stenosis. Aortic Valve: The aortic valve is normal in structure. Aortic valve regurgitation is not visualized. No aortic stenosis is present. Pulmonic Valve: The pulmonic valve was normal in structure. Pulmonic valve regurgitation is not visualized. No evidence of pulmonic stenosis. Aorta: The aortic root is normal in size and structure. Venous: The inferior vena cava is normal in size with greater than 50% respiratory variability, suggesting right atrial pressure of 3 mmHg. IAS/Shunts: No atrial level shunt detected by color flow Doppler.  LEFT VENTRICLE PLAX 2D LVIDd:         4.70 cm      Diastology LVIDs:         3.42 cm      LV e' lateral:   7.14 cm/s LV PW:         0.80 cm      LV E/e' lateral: 12.3 LV IVS:        0.80 cm      LV e' medial:    4.62 cm/s LVOT diam:     1.90 cm      LV E/e' medial:  19.0 LV SV:         45 LV SV Index:   25 LVOT Area:     2.84 cm  LV Volumes (MOD) LV vol d, MOD A2C: 103.0 ml LV vol d, MOD A4C: 115.0 ml LV vol s, MOD A2C: 61.9 ml LV vol s, MOD A4C: 46.6 ml LV SV MOD A2C:     41.1 ml LV SV MOD A4C:     115.0 ml LV SV MOD BP:      57.4 ml RIGHT VENTRICLE RV S prime:     10.10 cm/s TAPSE (M-mode): 1.6 cm LEFT ATRIUM             Index       RIGHT ATRIUM           Index LA diam:        4.20 cm 2.30 cm/m  RA Area:     10.30 cm LA Vol (A2C):   24.4 ml 13.38 ml/m RA Volume:   18.10 ml  9.92 ml/m LA Vol (A4C):   33.3 ml 18.26 ml/m LA Biplane Vol: 31.4 ml 17.22 ml/m  AORTIC VALVE LVOT Vmax:   70.00 cm/s LVOT Vmean:  40.000 cm/s LVOT VTI:    0.160 m  AORTA Ao Root diam: 3.10 cm MITRAL VALVE               TRICUSPID VALVE  MV Area (PHT): 5.02 cm    TR Peak grad:   20.2 mmHg  MV Decel Time: 151 msec    TR Vmax:        225.00 cm/s MV E velocity: 87.80 cm/s MV A velocity: 70.30 cm/s  SHUNTS MV E/A ratio:  1.25        Systemic VTI:  0.16 m                            Systemic Diam: 1.90 cm Ena Dawley MD Electronically signed by Ena Dawley MD Signature Date/Time: 11/14/2019/1:13:59 PM    Final

## 2019-11-23 NOTE — Progress Notes (Signed)
Physical Therapy Treatment Patient Details Name: Robert Little MRN: [id number] DOB: [birth year] Today's Date: 11/23/2019    History of Present Illness 79 y.o. male with medical history significant for chronic anemia, pulmonary fibrosis, hypertension, coronary artery disease, chronic diastolic CHF, and schizoaffective disorder, presenting to the emergency department with progressive shortness of breath long with severe orthopnea starting 1 to 2 days prior to hospital visit, in the hospital work-up suggestive of CHF and acute hypoxic respiratory failure and he was admitted 09/16/19    PT Comments    Pt with syncopal episode during therapy on the [place].  BPs soft to start, but on BSC 70s/40s and pt less responsive, so transferred back to bed and serial vitals checked (see vitals flow sheet).  RN informed of episode.  HR ranged from 80s-130s and was not activity dependent.  O2 sats in the 90s on 2 L O2 [place], but RR in the 30s throughout.  Pt alert and responsive once positioned in supine.     Follow Up Recommendations  SNF     Equipment Recommendations  Wheelchair (measurements PT);Wheelchair cushion (measurements PT);Hospital bed    Recommendations for Other Services   NA     Precautions / Restrictions Precautions Precautions: Fall;Other (comment) Precaution Comments: monitor O2 and BPs    Mobility  Bed Mobility Overal bed mobility: Needs Assistance Bed Mobility: Supine to Sit;Sit to Supine     Supine to sit: Mod assist;HOB elevated;+2 for physical assistance Sit to supine: Total assist;+2 for physical assistance   General bed mobility comments: Pt needed mod assist to move legs over the edge and assist pt in coming up to sitting from elevated HOB, posterior preference and no initiation of coming forward.    Transfers Overall transfer level: Needs assistance Equipment used: Rolling walker (2 wheeled) Transfers: Sit to/from [facility] Sit to Stand: +2 physical  assistance;Mod assist;Total assist Stand pivot transfers: +2 physical assistance;Mod assist;Total assist       General transfer comment: Two person heavy mod assist to stand and pivot to the [facility] as pt reported he needed to have a BP.  BPs taken and were soft but stable in supine.  once pt on [facility] he became less responsive (eyes open, but not responding to verbalizations of his name, stat BP taken and 70s/40), pt lifted total two person assist back to bed, bed placed in trendelenberg and serial BPs taken.  Pt responsive again once supine.  HR 80s-130s, RR in the 30s and O2 sats stable on 2 L O2 [place].  RN made aware of syncopal episode on [facility] and we spoke re: using bed pan and no OOB to [facility] or recliner chair right now.       Balance Overall balance assessment: Needs assistance Sitting-balance support: Feet supported;Bilateral upper extremity supported Sitting balance-Leahy Scale: Poor Sitting balance - Comments: mod assist EOB due to posterior preference.  Postural control: Posterior lean Standing balance support: Bilateral upper extremity supported Standing balance-Leahy Scale: Zero Standing balance comment: up to total assist for standing, however, when not syncopal he was heavy mod assist in standing with the support of RW that he needed manual assist to place hands on handles.                              Cognition Arousal/Alertness: Awake/alert Behavior During Therapy: Flat affect Overall Cognitive Status: Impaired/Different from baseline Area of Impairment: Orientation;Attention;Memory;Following commands;Safety/judgement;Awareness;Problem solving  Orientation Level: Place;Time;Situation;Person;Disoriented to Current Attention Level: Focused Memory: Decreased recall of precautions;Decreased short-term memory Following Commands: Follows one step commands with increased time;Follows one step commands inconsistently Safety/Judgement: Decreased awareness of  safety;Decreased awareness of deficits Awareness: Intellectual Problem Solving: Slow processing;Decreased initiation;Difficulty sequencing;Requires verbal cues;Requires tactile cues General Comments: "I need to sh**" pt exclaims as therapist helps him sit up, unable to initate movement to command, eyes open today and less lethargic than prior session, but still not at his basline or as good as he was when I saw him last week.              Pertinent Vitals/Pain Pain Assessment: Faces Pain Score: 0-No pain           PT Goals (current goals can now be found in the care plan section) Acute Rehab PT Goals Patient Stated Goal: Not stated Progress towards PT goals: Not progressing toward goals - comment(limited by hypotension)    Frequency    Min 2X/week      PT Plan Current plan remains appropriate       AM-PAC PT "6 Clicks" Mobility   Outcome Measure  Help needed turning from your back to your side while in a flat bed without using bedrails?: A Lot Help needed moving from lying on your back to sitting on the side of a flat bed without using bedrails?: A Lot Help needed moving to and from a bed to a chair (including a wheelchair)?: Total Help needed standing up from a chair using your arms (e.g., wheelchair or bedside chair)?: Total Help needed to walk in hospital room?: Total Help needed climbing 3-5 steps with a railing? : Total 6 Click Score: 8    End of Session Equipment Utilized During Treatment: Oxygen Activity Tolerance: Treatment limited secondary to medical complications (Comment)(hypotension) Patient left: in bed;with call bell/phone within reach;with bed alarm set Nurse Communication: Mobility status;Other (comment)(syncopal episode/drop in BP) PT Visit Diagnosis: Muscle weakness (generalized) (M62.81);Difficulty in walking, not elsewhere classified (R26.2)     Time: 1202-1227 PT Time Calculation (min) (ACUTE ONLY): 25 min  Charges:  $Therapeutic Activity:  23-37 mins                    Verdene Lennert, PT, DPT  Acute Rehabilitation [phone] pager #[phone] office     11/23/2019, 2:09 PM

## 2019-11-23 NOTE — TOC Progression Note (Signed)
Transition of Care [facility]) - Progression Note    Patient Details  Name: Robert Little MRN: [id number] Date of Birth: 1940-07-28  Transition of Care [facility]) CM/SW [place], LCSW Phone Number: 11/23/2019, 9:01 AM  Clinical Narrative:    Still awaiting pasrr. Per B Akins, they may have to close the screen since the patient is not medically stable for discharge yet. CSW advised he may be ready soon, but she stated it would be out of the window of evaluation. Pasrr will follow up.    Expected Discharge Plan: Skilled Nursing Facility Barriers to Discharge: [place] Rosalie Gums)  Expected Discharge Plan and Services Expected Discharge Plan: [place] In-house Referral: Clinical Social Work   Post Acute Care Choice: [employer] Living arrangements for the past 2 months: Group Home Expected Discharge Date: 11/16/19                                     Social Determinants of Health (SDOH) Interventions    Readmission Risk Interventions Readmission Risk Prevention Plan 11/23/2019 09/20/2019  Transportation Screening Complete Complete  PCP or Specialist Appt within 3-5 Days - Complete  HRI or [place] - Complete  Social Work Consult for [place] Planning/Counseling - Complete  Palliative Care Screening - Not Applicable  Medication Review Press photographer) Complete Complete  PCP or Specialist appointment within 3-5 days of discharge Complete -  [place] or Home Care Consult Complete -  SW Recovery Care/Counseling Consult Complete -  Palliative Care Screening Not Applicable -  [place] Complete -

## 2019-11-24 DIAGNOSIS — I4891 Unspecified atrial fibrillation: Secondary | ICD-10-CM

## 2019-11-24 LAB — COMPREHENSIVE METABOLIC PANEL
ALT: 25 U/L (ref 0–44)
AST: 21 U/L (ref 15–41)
Albumin: 2.3 g/dL — ABNORMAL LOW (ref 3.5–5.0)
Alkaline Phosphatase: 71 U/L (ref 38–126)
Anion gap: 8 (ref 5–15)
BUN: 41 mg/dL — ABNORMAL HIGH (ref 8–23)
CO2: 22 mmol/L (ref 22–32)
Calcium: 8 mg/dL — ABNORMAL LOW (ref 8.9–10.3)
Chloride: 116 mmol/L — ABNORMAL HIGH (ref 98–111)
Creatinine, Ser: 2.65 mg/dL — ABNORMAL HIGH (ref 0.61–1.24)
GFR calc Af Amer: 26 mL/min — ABNORMAL LOW (ref >60)
GFR calc non Af Amer: 22 mL/min — ABNORMAL LOW (ref >60)
Glucose, Bld: 206 mg/dL — ABNORMAL HIGH (ref 70–99)
Potassium: 3.8 mmol/L (ref 3.5–5.1)
Sodium: 146 mmol/L — ABNORMAL HIGH (ref 135–145)
Total Bilirubin: 0.7 mg/dL (ref 0.3–1.2)
Total Protein: 5.8 g/dL — ABNORMAL LOW (ref 6.5–8.1)

## 2019-11-24 LAB — GLUCOSE, CAPILLARY
Glucose-Capillary: 178 mg/dL — ABNORMAL HIGH (ref 70–99)
Glucose-Capillary: 111 mg/dL — ABNORMAL HIGH (ref 70–99)
Glucose-Capillary: 114 mg/dL — ABNORMAL HIGH (ref 70–99)
Glucose-Capillary: 115 mg/dL — ABNORMAL HIGH (ref 70–99)

## 2019-11-24 LAB — CBC WITH DIFFERENTIAL/PLATELET
Abs Immature Granulocytes: 0.23 10*3/uL — ABNORMAL HIGH (ref 0.00–0.07)
Basophils Absolute: 0 10*3/uL (ref 0.0–0.1)
Basophils Relative: 0 %
Eosinophils Absolute: 0.1 10*3/uL (ref 0.0–0.5)
Eosinophils Relative: 1 %
HCT: 26.1 % — ABNORMAL LOW (ref 39.0–52.0)
Hemoglobin: 8 g/dL — ABNORMAL LOW (ref 13.0–17.0)
Immature Granulocytes: 2 %
Lymphocytes Relative: 11 %
Lymphs Abs: 1.2 10*3/uL (ref 0.7–4.0)
MCH: 29.2 pg (ref 26.0–34.0)
MCHC: 30.7 g/dL (ref 30.0–36.0)
MCV: 95.3 fL (ref 80.0–100.0)
Monocytes Absolute: 0.8 10*3/uL (ref 0.1–1.0)
Monocytes Relative: 8 %
Neutro Abs: 8.6 10*3/uL — ABNORMAL HIGH (ref 1.7–7.7)
Neutrophils Relative %: 78 %
Platelets: 198 10*3/uL (ref 150–400)
RBC: 2.74 MIL/uL — ABNORMAL LOW (ref 4.22–5.81)
RDW: 17.2 % — ABNORMAL HIGH (ref 11.5–15.5)
WBC: 10.9 10*3/uL — ABNORMAL HIGH (ref 4.0–10.5)
nRBC: 0 % (ref 0.0–0.2)

## 2019-11-24 LAB — C-REACTIVE PROTEIN: CRP: 6.4 mg/dL — ABNORMAL HIGH (ref <1.0)

## 2019-11-24 LAB — D-DIMER, QUANTITATIVE: D-Dimer, Quant: 1.57 ug/mL-FEU — ABNORMAL HIGH (ref 0.00–0.50)

## 2019-11-24 MED ORDER — WHITE PETROLATUM EX OINT
TOPICAL_OINTMENT | CUTANEOUS | Status: AC
  Filled 2019-11-24: qty 28.35

## 2019-11-24 MED ORDER — CARVEDILOL 3.125 MG PO TABS
3.1250 mg | ORAL_TABLET | Freq: Two times a day (BID) | ORAL | Status: DC
  Administered 2019-11-24 – 2019-11-28 (×8): 3.125 mg via ORAL
  Filled 2019-11-24 (×9): qty 1

## 2019-11-24 MED ORDER — POTASSIUM CL IN DEXTROSE 5% 20 MEQ/L IV SOLN
20.0000 meq | INTRAVENOUS | Status: DC
  Administered 2019-11-24: 20 meq via INTRAVENOUS
  Filled 2019-11-24: qty 1000

## 2019-11-24 MED ORDER — MIDODRINE HCL 5 MG PO TABS
5.0000 mg | ORAL_TABLET | Freq: Three times a day (TID) | ORAL | Status: DC
  Administered 2019-11-24 – 2019-11-30 (×20): 5 mg via ORAL
  Filled 2019-11-24 (×20): qty 1

## 2019-11-24 MED ORDER — SODIUM CHLORIDE 0.9 % IV BOLUS
250.0000 mL | Freq: Once | INTRAVENOUS | Status: AC
  Administered 2019-11-25: 250 mL via INTRAVENOUS

## 2019-11-24 NOTE — Progress Notes (Signed)
patient's legal guardian Robert Little updated about plan of care over the phone.

## 2019-11-24 NOTE — TOC Progression Note (Signed)
Transition of Care [facility]) - Progression Note    Patient Details  Name: Robert Little MRN: [id number] Date of Birth: [birth year]  Transition of Care [facility]) CM/SW [place], [place] Phone Number: 11/24/2019, 12:26 PM  Clinical Narrative:     Pts passr number is [id number] F, and expires 12/23/19. Pt is not medically stable for d/c at this time.   Expected Discharge Plan: [place] Barriers to Discharge: [place] Rosalie Gums)  Expected Discharge Plan and Services Expected Discharge Plan: [place] In-house Referral: Clinical Social Work   Post Acute Care Choice: [place] Living arrangements for the past 2 months: Group Home Expected Discharge Date: 11/16/19                                     Social Determinants of Health (SDOH) Interventions    Readmission Risk Interventions Readmission Risk Prevention Plan 11/23/2019 09/20/2019  Transportation Screening Complete Complete  PCP or Specialist Appt within 3-5 Days - Complete  HRI or [place] - Complete  Social Work Consult for [place] Planning/Counseling - Complete  Palliative Care Screening - Not Applicable  Medication Review Press photographer) Complete Complete  PCP or Specialist appointment within 3-5 days of discharge Complete -  [place] or [place] Complete -  SW Recovery Care/Counseling Consult Complete -  [place], Latanya Presser, [place] Social Worker [phone]

## 2019-11-24 NOTE — Progress Notes (Signed)
PROGRESS NOTE                                                                                                                                                                                                             Patient Demographics:    Robert Little, is a 79 y.o. male, DOB - 06-28-1941, VCB:[id number]  Outpatient Primary MD for the patient is Robert Little    LOS - 12  Admit date - 11/12/2019    Chief Complaint  Patient presents with  . Shortness of Breath       Brief Narrative   Robert Little is a 79 y.o. male with history of his affective disorder, CAD, pulmonary fibrosis on home oxygen usually 2 L anemia hypertension diabetes mellitus recently admitted for hypoglycemia with use of sulfonylureas in the setting of chronic kidney disease was brought to the ER after patient has been having productive cough over the last 3 days feeling weak and tired not eating well.  ER he was diagnosed with acute hypoxic respiratory failure due to COVID-19 pneumonia, dehydration AKI and NSTEMI.  Was complicated by runs of A. fib with RVR, dehydration, hypotension and AKI.   Subjective:   Patient in bed, he himself denies any complaints, no significant events as discussed with staff.   Assessment  & Plan :   Sepsis with Acute Hypoxic Resp. Failure due to Acute Covid 19 Viral Pneumonitis during the ongoing 2020 Covid 19 Pandemic  -Patient is with moderate disease,. -He was treated with IV remdesivir. -He was treated with IV steroids as well. -Currently hypoxia back to baseline, he is on 2 L nasal cannula at baseline. -Overall extremely frail, deconditioned, high risk for decompensation. -CRP and D-dimers remains significantly remain significantly elevated, despite patient is more than 10 days, and receiving appropriate treatment, which is concerning, continue to monitor . -Patient extremely frail and debilitated,  not able to comply fully with incentive spirometry, and out of bed to chair.   SpO2: 96 % O2 Flow Rate (L/min): 2 L/min  Recent Labs  Lab 11/19/19 0731 11/19/19 0731 11/20/19 0447 11/21/19 0832 11/22/19 0643 11/22/19 1346 11/23/19 0622 11/24/19 0717  CRP 4.3*   < > 5.2* 10.7* 12.0*  --  12.1* 6.4*  DDIMER 1.48*   < > 1.11* 2.00* 2.40*  --  1.36* 1.57*  BNP 559.3*   < > 232.8* 396.7* 618.3* 749.8* 502.6*  --   PROCALCITON <0.10  --  0.12 0.19  --   --   --   --    < > = values in this interval not displayed.    Hepatic Function Latest Ref Rng & Units 11/24/2019 11/23/2019 11/22/2019  Total Protein 6.5 - 8.1 g/dL 5.8(L) 6.3(L) 6.1(L)  Albumin 3.5 - 5.0 g/dL 2.3(L) 2.5(L) 2.7(L)  AST 15 - 41 U/L 21 25 43(H)  ALT 0 - 44 U/L 25 30 34  Alk Phosphatase 38 - 126 U/L 71 80 80  Total Bilirubin 0.3 - 1.2 mg/dL 0.7 1.4(H) 1.5(H)    Dehydration with AKI on underlying CKD 4  -Baseline creatinine 2.5, at some point he had worsening renal function, and some evidence of volume depletion and low blood pressure, he required fluid boluses intermittently . -Continue with IV fluids at 50 cc/day .  Hypernatremia -Remains with poor oral intake, mains elevated today, but improving, will decrease in [place] has been stopped on D5W to 50 cc/h .  Paroxysmal atrial fibrillation with [place] vas 2 score of at least 3 with RVR and Hypotension  -Remains in A. fib with RVR, low heart rate as well, which is complicating his heart rate control plan, will start on Midodrin today to improve blood pressure, and I resumed him back on Coreg, as well will continue with amiodarone . -Previous MD discussed with cardiologist Dr. Ellyn Hack, extremely high risk candidate for anticoagulation due to fall risk and frail status, aspirin, rate better will be placed on  with oral amiodarone.    History of CAD.  - Not NSTEMI.  Could be due to demand ischemia from hypoxia, continue aspirin, statin and beta-blocker for secondary  prevention, stable echocardiogram, stable EKG and chest pain-free seen by cardiology continue present care with outpatient cardiology follow-up post discharge.  History of schizoaffective disorder -  takes monthly injection of Invega and daily on Klonopin and Cogentin.  - Follows at [facility] follow up.  -  According to the POA ports his [name] has been stopped by his primary psychiatrist in April of this year, given worsening renal function , so I have discontinued his Cogentin there inpatient psych recommendation . -He is back on his Seroquel (held briefly secondary to sedation couple days ago), and Klonopin dose has been lowered to nightly only.  History of pulmonary fibrosis now on 2 L home oxygen.  - Continue supportive care with a liters as needed.  Outpatient pulmonary follow-up.  Anemia of chronic disease. -  Stable will monitor.  Chronic diastolic CHF.  EF on recent echocardiogram was 60%.  Appears dehydrated, hold Lasix, continue with IV fluids .  Chronically elevated D-dimers.   - Previously has had negative lower extremity venous duplex, he is on subcu heparin for DVT prophylaxis  Essential hypertension.  Now hypotensive - IVF repeat on 11/20/2019 and monitor, discontinued Coreg low-dose Lopressor if tolerated.  Hypokalemia - repleted.   DM type II.  For now sliding scale, addedm low-dose Lantus as CBGs are running high due to steroid use.   Lab Results  Component Value Date   HGBA1C 7.0 (H) 09/20/2019   CBG (last 3)  Recent Labs    11/23/19 2013 11/24/19 0755 11/24/19 1141  GLUCAP 180* 178* 111*     Condition - Extremely Guarded  Family Communication  : Discussed with POA Mr. Robert Little 5/21 and have updated him, I have told him overall  condition remains very tenuous in this patient with multiple comorbidities, poor baseline, with underlining schizophrenia and severe deconditioning.  But for now we will continue with current medical measures to address  his heart rate, blood pressure, and multiple comorbidities. .  Code Status :  DNR  Consults  :  Cards, Psych  Disposition Plan  :    Status is: Inpatient  Remains inpatient appropriate because:Inpatient level of care appropriate due to severity of illness  Dispo: The patient is from: Group home              Anticipated d/c is to: Group home              Anticipated d/c date is: 3 days              Patient currently is not medically stable to d/c. Covid, Afib RVR, AKI     Procedures  :    TTE - 1. Left ventricular ejection fraction, by estimation, is 60 to 65%. The left ventricle has normal function. The left ventricle has no regional wall motion abnormalities. There is mild concentric left ventricular hypertrophy. Left ventricular diastolic parameters are consistent with Grade II diastolic dysfunction  (pseudonormalization). Elevated left atrial pressure.  2. Right ventricular systolic function is normal. The right ventricular size is normal. There is normal pulmonary artery systolic pressure.  3. Left atrial size was mildly dilated.  4. The mitral valve is normal in structure. Mild mitral valve regurgitation. No evidence of mitral stenosis.  5. The aortic valve is normal in structure. Aortic valve regurgitation is not visualized. No aortic stenosis is present.  6. The inferior vena cava is normal in size with greater than 50% respiratory variability, suggesting right atrial pressure of 3 mmHg.   PUD Prophylaxis :  None  DVT Prophylaxis  :   Heparin   Lab Results  Component Value Date   PLT 198 11/24/2019    Diet :  Diet Order            DIET DYS 2 Room service appropriate? No; Fluid consistency: Nectar Thick  Diet effective now               Inpatient Medications  Scheduled Meds: . albuterol  2 puff Inhalation Q2200  . amiodarone  200 mg Oral Daily  . aspirin EC  81 mg Oral Daily  . atorvastatin  20 mg Oral QHS  . carvedilol  3.125 mg Oral BID WC  .  Chlorhexidine Gluconate Cloth  6 each Topical Daily  . clonazePAM  0.5 mg Oral QHS  . ferrous sulfate  325 mg Oral BID WC  . fluticasone furoate-vilanterol  1 puff Inhalation Daily  . heparin injection (subcutaneous)  5,000 Units Subcutaneous Q8H  . insulin aspart  0-6 Units Subcutaneous TID WC  . insulin aspart  3 Units Subcutaneous TID WC  . lidocaine  1 application Urethral Once  . midodrine  5 mg Oral TID WC  . pantoprazole  40 mg Oral Daily  . QUEtiapine  25 mg Oral QHS  . tamsulosin  0.4 mg Oral Daily  . umeclidinium bromide  1 puff Inhalation Daily  . vitamin B-12  500 mcg Oral BID   Continuous Infusions: . dextrose 5 % with KCl 20 mEq / L 20 mEq (11/24/19 1511)   PRN Meds:.acetaminophen, ALPRAZolam, metoprolol tartrate, [DISCONTINUED] ondansetron **OR** ondansetron (ZOFRAN) IV, Resource ThickenUp Clear, zolpidem  Antibiotics  :    Anti-infectives (From admission, onward)   Start  Dose/Rate Route Frequency Ordered Stop   11/13/19 1600  remdesivir 100 mg in sodium chloride 0.9 % 100 mL IVPB     100 mg 200 mL/hr over 30 Minutes Intravenous Daily 11/12/19 2146 11/16/19 1700   11/12/19 2300  remdesivir 200 mg in sodium chloride 0.9% 250 mL IVPB     200 mg 580 mL/hr over 30 Minutes Intravenous Once 11/12/19 2146 11/13/19 0104   11/12/19 2130  cefTRIAXone (ROCEPHIN) 2 g in sodium chloride 0.9 % 100 mL IVPB  Status:  Discontinued     2 g 200 mL/hr over 30 Minutes Intravenous  Once 11/12/19 2056 11/12/19 2125   11/12/19 2130  azithromycin (ZITHROMAX) 500 mg in sodium chloride 0.9 % 250 mL IVPB  Status:  Discontinued     500 mg 250 mL/hr over 60 Minutes Intravenous  Once 11/12/19 2056 11/12/19 2125       Time Spent in minutes  30   Phillips Climes M.D on 11/24/2019 at 3:27 PM  To page go to [email or web address] - password [email or web address]  [facility] -  Office  [phone]  See all Orders from today for further details    Objective:   Vitals:   11/23/19 2000 11/24/19 0540  11/24/19 0559 11/24/19 0901  BP: 111/60 118/61 120/64 124/66  Pulse: (!) 105 92 (!) 106 (!) 108  Resp: 20 (!) 22 19   Temp: 99.9 F (37.7 C) 99.7 F (37.6 C) 97.6 F (36.4 C)   TempSrc: Rectal Rectal Axillary   SpO2: 95% 98% 96%   Weight:      Height:        Wt Readings from Last 3 Encounters:  11/13/19 69.4 kg  10/28/19 69.4 kg  10/10/19 69.4 kg     Intake/Output Summary (Last 24 hours) at 11/24/2019 1527 Last data filed at 11/24/2019 1400 Gross per 24 hour  Intake 1726.34 ml  Output 800 ml  Net 926.34 ml     Physical Exam  Edema frail, chronically ill-appearing male, laying in bed, in no apparent distress  Symmetrical Chest wall movement, mildly diminished air entry at the bases, no wheezing RRR,No Gallops,Rubs or new Murmurs, No Parasternal Heave +ve B.Sounds, Abd Soft, No tenderness, No rebound - guarding or rigidity. No Cyanosis, Clubbing or edema, No new Rash or bruise        Data Review:    CBC Recent Labs  Lab 11/18/19 0322 11/18/19 0322 11/19/19 0731 11/20/19 0447 11/21/19 0832 11/23/19 0622 11/24/19 0717  WBC 17.2*   < > 14.2* 11.8* 9.5 10.2 10.9*  HGB 9.5*   < > 9.1* 9.0* 8.1* 9.7* 8.0*  HCT 28.8*   < > 28.3* 29.2* 25.4* 31.7* 26.1*  PLT 361   < > 324 PLATELET CLUMPS NOTED ON SMEAR, UNABLE TO ESTIMATE 258 PLATELET CLUMPS NOTED ON SMEAR, UNABLE TO ESTIMATE 198  MCV 89.7   < > 90.1 92.7 91.0 93.5 95.3  MCH 29.6   < > 29.0 28.6 29.0 28.6 29.2  MCHC 33.0   < > 32.2 30.8 31.9 30.6 30.7  RDW 15.6*   < > 16.1* 16.5* 16.7* 17.1* 17.2*  LYMPHSABS 2.0  --   --  1.5 1.3 1.2 1.2  MONOABS 1.1*  --   --  0.9 0.8 0.8 0.8  EOSABS 0.0  --   --  0.1 0.1 0.2 0.1  BASOSABS 0.1  --   --  0.0 0.0 0.0 0.0   < > = values in this interval not displayed.  Chemistries  Recent Labs  Lab 11/19/19 0731 11/19/19 0731 11/20/19 0447 11/21/19 0832 11/22/19 0643 11/23/19 0622 11/24/19 0717  NA 147*   < > 146* 142 147* 149* 146*  K 5.0   < > 5.4* 2.8* 4.0 3.9  3.8  CL 114*   < > 114* 109 113* 115* 116*  CO2 18*   < > 20* 22 21* 23 22  GLUCOSE 183*   < > 181* 216* 198* 174* 206*  BUN 106*   < > 89* 51* 44* 36* 41*  CREATININE 3.59*   < > 3.06* 2.57* 2.45* 2.34* 2.65*  CALCIUM 9.0   < > 9.0 8.2* 8.6* 8.7* 8.0*  AST  --   --  32 40 43* 25 21  ALT  --   --  25 30 34 30 25  ALKPHOS  --   --  80 76 80 80 71  BILITOT  --   --  1.4* 1.1 1.5* 1.4* 0.7  MG 1.9  --   --   --   --   --   --    < > = values in this interval not displayed.     ------------------------------------------------------------------------------------------------------------------ No results for input(s): CHOL, HDL, LDLCALC, TRIG, CHOLHDL, LDLDIRECT in the last 72 hours.  Lab Results  Component Value Date   HGBA1C 7.0 (H) 09/20/2019   ------------------------------------------------------------------------------------------------------------------ No results for input(s): TSH, T4TOTAL, T3FREE, THYROIDAB in the last 72 hours.  Invalid input(s): FREET3  Cardiac Enzymes No results for input(s): CKMB, TROPONINI, MYOGLOBIN in the last 168 hours.  Invalid input(s): CK ------------------------------------------------------------------------------------------------------------------    Component Value Date/Time   BNP 502.6 (H) 11/23/2019 8250    Micro Results No results found for this or any previous visit (from the past 240 hour(s)).  Radiology Reports CT Head Wo Contrast  Result Date: 10/28/2019 CLINICAL DATA:  Un witnessed fall, weakness, confusion EXAM: CT HEAD WITHOUT CONTRAST TECHNIQUE: Contiguous axial images were obtained from the base of the skull through the vertex without intravenous contrast. COMPARISON:  None. FINDINGS: Brain: Hypodensities in the periventricular white matter, most pronounced in the left frontal region, most likely reflect chronic small vessel ischemic change. No signs of acute infarct or hemorrhage. Lateral ventricles and remaining midline  structures are unremarkable. No acute extra-axial fluid collections. No mass effect. Vascular: No hyperdense vessel or unexpected calcification. Skull: Normal. Negative for fracture or focal lesion. Sinuses/Orbits: Mucoperiosteal thickening within the maxillary sinuses, right greater than left. Remaining sinuses are clear. Other: None. IMPRESSION: 1. No acute intracranial trauma. 2. Likely chronic small vessel ischemic changes within the frontal periventricular white matter. Electronically Signed   By: Randa Ngo M.D.   On: 10/28/2019 19:31   DG Chest Port 1 View  Result Date: 11/20/2019 CLINICAL DATA:  Shortness of breath, cough. EXAM: PORTABLE CHEST 1 VIEW COMPARISON:  Nov 19, 2019. FINDINGS: Stable cardiomediastinal silhouette. Sternotomy wires are noted. No pneumothorax or pleural effusion is noted. Stable fibrotic changes noted in the lung bases. No definite acute abnormality is noted. Bony thorax is unremarkable. IMPRESSION: Stable bibasilar fibrotic changes are noted. No definite acute cardiopulmonary abnormality seen. Electronically Signed   By: Marijo Conception M.D.   On: 11/20/2019 08:32   DG Chest Port 1 View  Result Date: 11/19/2019 CLINICAL DATA:  Shortness of breath EXAM: PORTABLE CHEST 1 VIEW COMPARISON:  Nov 12, 2019 chest radiograph and chest CT September 19, 2019 FINDINGS: There is fibrotic change scattered throughout the lungs, most notably in the  mid and lower lung regions, essentially stable. No new opacity evident. Heart is upper normal in size with pulmonary vascularity normal. Patient is status post coronary artery bypass grafting. No adenopathy appreciable. There is aortic atherosclerosis. No evident bone lesions. IMPRESSION: Stable fibrotic type change, most severe in the mid and lower lung regions. No new opacity evident. Stable cardiac silhouette. Postoperative changes noted. Aortic Atherosclerosis (ICD10-I70.0). Electronically Signed   By: Lowella Grip III M.D.   On:  11/19/2019 08:24   DG Chest Port 1 View  Result Date: 11/12/2019 CLINICAL DATA:  Shortness of breath and sepsis. Fever. Productive cough. EXAM: PORTABLE CHEST 1 VIEW COMPARISON:  10/28/2019.  09/19/2019. FINDINGS: The patient has a history of pulmonary fibrosis and emphysema. Chronic lung markings appear similar allowing for technical differences. Upper lobe emphysema as seen previously. No sign of dense consolidation, collapse or effusion. Previous median sternotomy and CABG. Aortic atherosclerosis. No acute bone finding. IMPRESSION: Portable film with the poor inspiration. Chronic fibrotic lung disease and upper lung emphysema. No sign of acute infiltrate, collapse or effusion. Electronically Signed   By: Nelson Chimes M.D.   On: 11/12/2019 19:37   DG Chest Portable 1 View  Result Date: 10/28/2019 CLINICAL DATA:  79 year old male with altered mental status. EXAM: PORTABLE CHEST 1 VIEW COMPARISON:  Chest radiograph dated 09/19/2019 and CT dated 09/19/2019 FINDINGS: Diffuse interstitial scarring in keeping with fibrosis. No new consolidation. There is no pleural effusion or pneumothorax. Stable cardiomediastinal silhouette. Atherosclerotic calcification of the aorta. Median sternotomy wires and CABG vascular clips. IMPRESSION: 1. No acute cardiopulmonary process. 2. Fibrotic changes. Electronically Signed   By: Anner Crete M.D.   On: 10/28/2019 21:16   ECHOCARDIOGRAM COMPLETE  Result Date: 11/14/2019    ECHOCARDIOGRAM REPORT   Patient Name:   BOHDI LEEDS Date of Exam: 11/14/2019 Medical Rec #:  [id number]    Height:       68.0 in Accession #:    [id number]   Weight:       153.0 lb Date of Birth:  Feb 15, 1941    BSA:          1.824 m Patient Age:    82 years     BP:           131/61 mmHg Patient Gender: M            HR:           58 bpm. Exam Location:  Inpatient Procedure: 2D Echo, Cardiac Doppler and Color Doppler Indications:    Elevated Troponin  History:        Patient has prior history of  Echocardiogram examinations, most                 recent 09/06/2019. CHF, CAD; Risk Factors:Hypertension,                 Dyslipidemia and Former Smoker.  Sonographer:    Vickie Epley RDCS Referring Phys: [id number] RHONDA G BARRETT  Sonographer Comments: Covid positive. IMPRESSIONS  1. Left ventricular ejection fraction, by estimation, is 60 to 65%. The left ventricle has normal function. The left ventricle has no regional wall motion abnormalities. There is mild concentric left ventricular hypertrophy. Left ventricular diastolic parameters are consistent with Grade II diastolic dysfunction (pseudonormalization). Elevated left atrial pressure.  2. Right ventricular systolic function is normal. The right ventricular size is normal. There is normal pulmonary artery systolic pressure.  3. Left atrial size was mildly dilated.  4. The mitral valve  is normal in structure. Mild mitral valve regurgitation. No evidence of mitral stenosis.  5. The aortic valve is normal in structure. Aortic valve regurgitation is not visualized. No aortic stenosis is present.  6. The inferior vena cava is normal in size with greater than 50% respiratory variability, suggesting right atrial pressure of 3 mmHg. FINDINGS  Left Ventricle: Left ventricular ejection fraction, by estimation, is 60 to 65%. The left ventricle has normal function. The left ventricle has no regional wall motion abnormalities. The left ventricular internal cavity size was normal in size. There is  mild concentric left ventricular hypertrophy. Left ventricular diastolic parameters are consistent with Grade II diastolic dysfunction (pseudonormalization). Elevated left atrial pressure. Right Ventricle: The right ventricular size is normal. No increase in right ventricular wall thickness. Right ventricular systolic function is normal. There is normal pulmonary artery systolic pressure. The tricuspid regurgitant velocity is 2.25 m/s, and  with an assumed right atrial pressure of 3  mmHg, the estimated right ventricular systolic pressure is 40.9 mmHg. Left Atrium: Left atrial size was mildly dilated. Right Atrium: Right atrial size was normal in size. Pericardium: There is no evidence of pericardial effusion. Mitral Valve: The mitral valve is normal in structure. Normal mobility of the mitral valve leaflets. Mild mitral valve regurgitation. No evidence of mitral valve stenosis. Tricuspid Valve: The tricuspid valve is normal in structure. Tricuspid valve regurgitation is not demonstrated. No evidence of tricuspid stenosis. Aortic Valve: The aortic valve is normal in structure. Aortic valve regurgitation is not visualized. No aortic stenosis is present. Pulmonic Valve: The pulmonic valve was normal in structure. Pulmonic valve regurgitation is not visualized. No evidence of pulmonic stenosis. Aorta: The aortic root is normal in size and structure. Venous: The inferior vena cava is normal in size with greater than 50% respiratory variability, suggesting right atrial pressure of 3 mmHg. IAS/Shunts: No atrial level shunt detected by color flow Doppler.  LEFT VENTRICLE PLAX 2D LVIDd:         4.70 cm      Diastology LVIDs:         3.42 cm      LV e' lateral:   7.14 cm/s LV PW:         0.80 cm      LV E/e' lateral: 12.3 LV IVS:        0.80 cm      LV e' medial:    4.62 cm/s LVOT diam:     1.90 cm      LV E/e' medial:  19.0 LV SV:         45 LV SV Index:   25 LVOT Area:     2.84 cm  LV Volumes (MOD) LV vol d, MOD A2C: 103.0 ml LV vol d, MOD A4C: 115.0 ml LV vol s, MOD A2C: 61.9 ml LV vol s, MOD A4C: 46.6 ml LV SV MOD A2C:     41.1 ml LV SV MOD A4C:     115.0 ml LV SV MOD BP:      57.4 ml RIGHT VENTRICLE RV S prime:     10.10 cm/s TAPSE (M-mode): 1.6 cm LEFT ATRIUM             Index       RIGHT ATRIUM           Index LA diam:        4.20 cm 2.30 cm/m  RA Area:     10.30 cm LA Vol (A2C):  24.4 ml 13.38 ml/m RA Volume:   18.10 ml  9.92 ml/m LA Vol (A4C):   33.3 ml 18.26 ml/m LA Biplane Vol: 31.4 ml  17.22 ml/m  AORTIC VALVE LVOT Vmax:   70.00 cm/s LVOT Vmean:  40.000 cm/s LVOT VTI:    0.160 m  AORTA Ao Root diam: 3.10 cm MITRAL VALVE               TRICUSPID VALVE MV Area (PHT): 5.02 cm    TR Peak grad:   20.2 mmHg MV Decel Time: 151 msec    TR Vmax:        225.00 cm/s MV E velocity: 87.80 cm/s MV A velocity: 70.30 cm/s  SHUNTS MV E/A ratio:  1.25        Systemic VTI:  0.16 m                            Systemic Diam: 1.90 cm Ena Dawley MD Electronically signed by Ena Dawley MD Signature Date/Time: 11/14/2019/1:13:59 PM    Final

## 2019-11-25 DIAGNOSIS — E871 Hypo-osmolality and hyponatremia: Secondary | ICD-10-CM

## 2019-11-25 LAB — COMPREHENSIVE METABOLIC PANEL
ALT: 29 U/L (ref 0–44)
AST: 25 U/L (ref 15–41)
Albumin: 2.1 g/dL — ABNORMAL LOW (ref 3.5–5.0)
Alkaline Phosphatase: 73 U/L (ref 38–126)
Anion gap: 10 (ref 5–15)
BUN: 35 mg/dL — ABNORMAL HIGH (ref 8–23)
CO2: 21 mmol/L — ABNORMAL LOW (ref 22–32)
Calcium: 7.8 mg/dL — ABNORMAL LOW (ref 8.9–10.3)
Chloride: 115 mmol/L — ABNORMAL HIGH (ref 98–111)
Creatinine, Ser: 2.43 mg/dL — ABNORMAL HIGH (ref 0.61–1.24)
GFR calc Af Amer: 29 mL/min — ABNORMAL LOW (ref >60)
GFR calc non Af Amer: 25 mL/min — ABNORMAL LOW (ref >60)
Glucose, Bld: 136 mg/dL — ABNORMAL HIGH (ref 70–99)
Potassium: 4.1 mmol/L (ref 3.5–5.1)
Sodium: 146 mmol/L — ABNORMAL HIGH (ref 135–145)
Total Bilirubin: 1.3 mg/dL — ABNORMAL HIGH (ref 0.3–1.2)
Total Protein: 5.8 g/dL — ABNORMAL LOW (ref 6.5–8.1)

## 2019-11-25 LAB — GLUCOSE, CAPILLARY
Glucose-Capillary: 164 mg/dL — ABNORMAL HIGH (ref 70–99)
Glucose-Capillary: 254 mg/dL — ABNORMAL HIGH (ref 70–99)
Glucose-Capillary: 198 mg/dL — ABNORMAL HIGH (ref 70–99)
Glucose-Capillary: 211 mg/dL — ABNORMAL HIGH (ref 70–99)
Glucose-Capillary: 134 mg/dL — ABNORMAL HIGH (ref 70–99)

## 2019-11-25 LAB — CBC WITH DIFFERENTIAL/PLATELET
Abs Immature Granulocytes: 0.15 10*3/uL — ABNORMAL HIGH (ref 0.00–0.07)
Basophils Absolute: 0 10*3/uL (ref 0.0–0.1)
Basophils Relative: 0 %
Eosinophils Absolute: 0.1 10*3/uL (ref 0.0–0.5)
Eosinophils Relative: 1 %
HCT: 26.4 % — ABNORMAL LOW (ref 39.0–52.0)
Hemoglobin: 8 g/dL — ABNORMAL LOW (ref 13.0–17.0)
Immature Granulocytes: 1 %
Lymphocytes Relative: 9 %
Lymphs Abs: 1.1 10*3/uL (ref 0.7–4.0)
MCH: 29 pg (ref 26.0–34.0)
MCHC: 30.3 g/dL (ref 30.0–36.0)
MCV: 95.7 fL (ref 80.0–100.0)
Monocytes Absolute: 1 10*3/uL (ref 0.1–1.0)
Monocytes Relative: 8 %
Neutro Abs: 10.2 10*3/uL — ABNORMAL HIGH (ref 1.7–7.7)
Neutrophils Relative %: 81 %
Platelets: 174 10*3/uL (ref 150–400)
RBC: 2.76 MIL/uL — ABNORMAL LOW (ref 4.22–5.81)
RDW: 17.1 % — ABNORMAL HIGH (ref 11.5–15.5)
WBC: 12.5 10*3/uL — ABNORMAL HIGH (ref 4.0–10.5)
nRBC: 0 % (ref 0.0–0.2)

## 2019-11-25 LAB — C-REACTIVE PROTEIN: CRP: 10.2 mg/dL — ABNORMAL HIGH (ref <1.0)

## 2019-11-25 LAB — D-DIMER, QUANTITATIVE: D-Dimer, Quant: 1.05 ug/mL-FEU — ABNORMAL HIGH (ref 0.00–0.50)

## 2019-11-25 MED ORDER — DEXTROSE 5 % IV SOLN: INTRAVENOUS | Status: DC

## 2019-11-25 NOTE — Progress Notes (Signed)
PROGRESS NOTE                                                                                                                                                                                                             Patient Demographics:    Hasan Douse, is a 79 y.o. male, DOB - 1941/06/20, EGB:[id number]  Outpatient Primary MD for the patient is [name]    LOS - 13  Admit date - 11/12/2019    Chief Complaint  Patient presents with  . Shortness of Breath       Brief Narrative   Karanvir Balderston is a 79 y.o. male with history of his affective disorder, CAD, pulmonary fibrosis on home oxygen usually 2 L anemia hypertension diabetes mellitus recently admitted for hypoglycemia with use of sulfonylureas in the setting of chronic kidney disease was brought to the ER after patient has been having productive cough over the last 3 days feeling weak and tired not eating well.  ER he was diagnosed with acute hypoxic respiratory failure due to COVID-19 pneumonia, dehydration AKI and NSTEMI.  Was complicated by runs of A. fib with RVR, dehydration, hypotension and AKI.   Subjective:   Patient in bed, he himself denies any complaints, blood pressure was low overnight where he did require 250 cc fluid bolus overnight..   Assessment  & Plan :   Sepsis with Acute Hypoxic Resp. Failure due to Acute Covid 19 Viral Pneumonitis during the ongoing 2020 Covid 19 Pandemic  -Patient is with moderate disease,. -He was treated with IV remdesivir. -He was treated with IV steroids as well. -Currently hypoxia back to baseline, he is on 2 L nasal cannula at baseline. -Overall extremely frail, deconditioned, high risk for decompensation. -CRP and D-dimers remains significantly remain significantly elevated, despite patient is more than 10 days, and receiving appropriate treatment, which is concerning, continue to monitor  . -Patient extremely frail and debilitated, not able to comply fully with incentive spirometry, and out of bed to chair.   SpO2: 95 % O2 Flow Rate (L/min): 2 L/min  Recent Labs  Lab 11/19/19 0731 11/19/19 0731 11/20/19 0447 11/20/19 0447 11/21/19 0832 11/22/19 0643 11/22/19 1346 11/23/19 0622 11/24/19 0717 11/25/19 0332  CRP 4.3*   < > 5.2*   < > 10.7* 12.0*  --  12.1* 6.4*  10.2*  DDIMER 1.48*   < > 1.11*   < > 2.00* 2.40*  --  1.36* 1.57* 1.05*  BNP 559.3*   < > 232.8*  --  396.7* 618.3* 749.8* 502.6*  --   --   PROCALCITON <0.10  --  0.12  --  0.19  --   --   --   --   --    < > = values in this interval not displayed.    Hepatic Function Latest Ref Rng & Units 11/25/2019 11/24/2019 11/23/2019  Total Protein 6.5 - 8.1 g/dL 5.8(L) 5.8(L) 6.3(L)  Albumin 3.5 - 5.0 g/dL 2.1(L) 2.3(L) 2.5(L)  AST 15 - 41 U/L 25 21 25   ALT 0 - 44 U/L 29 25 30   Alk Phosphatase 38 - 126 U/L 73 71 80  Total Bilirubin 0.3 - 1.2 mg/dL 1.3(H) 0.7 1.4(H)    Dehydration with AKI on underlying CKD 4  -Baseline creatinine 2.5, at some point he had worsening renal function, and some evidence of volume depletion and low blood pressure, he required fluid boluses intermittently . -Blood pressure remains on the lower side, he still needing IV fluid, actually did require fluid bolus overnight, continue with fluids at 75 cc/h.  Hypernatremia -Remains with poor oral intake, mains elevated today, but improving, will increase his fluids to 75 cc/h of D5W .  Paroxysmal atrial fibrillation with Mali vas 2 score of at least 3 with RVR and Hypotension  -Heart rate was always difficult to control in the setting of soft blood pressure, and holding beta-blockers, patient was started on midodrine, and IV fluid to assist with blood pressure, now he is on Coreg with better blood pressure control, as we will continue with amiodarone . -Previous MD discussed with cardiologist Dr. Ellyn Hack, extremely high risk candidate for  anticoagulation due to fall risk and frail status, aspirin, rate better will be placed on  with oral amiodarone.    History of CAD.  - Not NSTEMI.  Could be due to demand ischemia from hypoxia, continue aspirin, statin and beta-blocker for secondary prevention, stable echocardiogram, stable EKG and chest pain-free seen by cardiology continue present care with outpatient cardiology follow-up post discharge.  History of schizoaffective disorder -  takes monthly injection of Invega and daily on Klonopin and Cogentin.  - Follows at [facility] follow up.  -  According to the POA ports his [name] has been stopped by his primary psychiatrist in April of this year, given worsening renal function , so I have discontinued his Cogentin there inpatient psych recommendation . -He is back on his Seroquel (held briefly secondary to sedation couple days ago), and Klonopin dose has been lowered to nightly only.  History of pulmonary fibrosis now on 2 L home oxygen.  - Continue supportive care with a liters as needed.  Outpatient pulmonary follow-up.  Anemia of chronic disease. -  Stable will monitor.  Chronic diastolic CHF.  EF on recent echocardiogram was 60%.  Appears dehydrated, hold Lasix, continue with IV fluids .  Chronically elevated D-dimers.   - Previously has had negative lower extremity venous duplex, he is on subcu heparin for DVT prophylaxis  Essential hypertension.  Now hypotensive - IVF repeat on 11/20/2019 and monitor, discontinued Coreg low-dose Lopressor if tolerated.  Hypokalemia - repleted.   DM type II.  For now sliding scale, addedm low-dose Lantus as CBGs are running high due to steroid use.   Lab Results  Component Value Date   HGBA1C 7.0 (  H) 09/20/2019   CBG (last 3)  Recent Labs    11/24/19 2131 11/25/19 0752 11/25/19 1154  GLUCAP 115* 164* 254*     Condition - Extremely Guarded  Family Communication  : Discussed with POA [name] 5/21 and have  updated him, I have told him overall condition remains very tenuous in this patient with multiple comorbidities, poor baseline, with underlining schizophrenia and severe deconditioning.  But for now we will continue with current medical measures to address his heart rate, blood pressure, and multiple comorbidities. .  Code Status :  DNR  Consults  :  Cards, Psych  Disposition Plan  :    Status is: Inpatient  Remains inpatient appropriate because:Inpatient level of care appropriate due to severity of illness  Dispo: The patient is from: Group home              Anticipated d/c is to: Group home              Anticipated d/c date is: 3 days              Patient currently is not medically stable to d/c. Covid, Afib RVR, AKI     Procedures  :    TTE - 1. Left ventricular ejection fraction, by estimation, is 60 to 65%. The left ventricle has normal function. The left ventricle has no regional wall motion abnormalities. There is mild concentric left ventricular hypertrophy. Left ventricular diastolic parameters are consistent with Grade II diastolic dysfunction  (pseudonormalization). Elevated left atrial pressure.  2. Right ventricular systolic function is normal. The right ventricular size is normal. There is normal pulmonary artery systolic pressure.  3. Left atrial size was mildly dilated.  4. The mitral valve is normal in structure. Mild mitral valve regurgitation. No evidence of mitral stenosis.  5. The aortic valve is normal in structure. Aortic valve regurgitation is not visualized. No aortic stenosis is present.  6. The inferior vena cava is normal in size with greater than 50% respiratory variability, suggesting right atrial pressure of 3 mmHg.   PUD Prophylaxis :  None  DVT Prophylaxis  :   Heparin   Lab Results  Component Value Date   PLT 174 11/25/2019    Diet :  Diet Order            DIET DYS 2 Room service appropriate? No; Fluid consistency: Nectar Thick  Diet  effective now               Inpatient Medications  Scheduled Meds: . albuterol  2 puff Inhalation Q2200  . amiodarone  200 mg Oral Daily  . aspirin EC  81 mg Oral Daily  . atorvastatin  20 mg Oral QHS  . carvedilol  3.125 mg Oral BID WC  . Chlorhexidine Gluconate Cloth  6 each Topical Daily  . clonazePAM  0.5 mg Oral QHS  . ferrous sulfate  325 mg Oral BID WC  . fluticasone furoate-vilanterol  1 puff Inhalation Daily  . heparin injection (subcutaneous)  5,000 Units Subcutaneous Q8H  . insulin aspart  0-6 Units Subcutaneous TID WC  . insulin aspart  3 Units Subcutaneous TID WC  . lidocaine  1 application Urethral Once  . midodrine  5 mg Oral TID WC  . pantoprazole  40 mg Oral Daily  . QUEtiapine  25 mg Oral QHS  . tamsulosin  0.4 mg Oral Daily  . umeclidinium bromide  1 puff Inhalation Daily  . vitamin B-12  500  mcg Oral BID   Continuous Infusions: . dextrose 75 mL/hr at 11/25/19 0842   PRN Meds:.acetaminophen, ALPRAZolam, metoprolol tartrate, [DISCONTINUED] ondansetron **OR** ondansetron (ZOFRAN) IV, Resource ThickenUp Clear, zolpidem  Antibiotics  :    Anti-infectives (From admission, onward)   Start     Dose/Rate Route Frequency Ordered Stop   11/13/19 1600  remdesivir 100 mg in sodium chloride 0.9 % 100 mL IVPB     100 mg 200 mL/hr over 30 Minutes Intravenous Daily 11/12/19 2146 11/16/19 1700   11/12/19 2300  remdesivir 200 mg in sodium chloride 0.9% 250 mL IVPB     200 mg 580 mL/hr over 30 Minutes Intravenous Once 11/12/19 2146 11/13/19 0104   11/12/19 2130  cefTRIAXone (ROCEPHIN) 2 g in sodium chloride 0.9 % 100 mL IVPB  Status:  Discontinued     2 g 200 mL/hr over 30 Minutes Intravenous  Once 11/12/19 2056 11/12/19 2125   11/12/19 2130  azithromycin (ZITHROMAX) 500 mg in sodium chloride 0.9 % 250 mL IVPB  Status:  Discontinued     500 mg 250 mL/hr over 60 Minutes Intravenous  Once 11/12/19 2056 11/12/19 2125        Emeline Gins Nandika Stetzer M.D on 11/25/2019 at  2:55 PM  To page go to [email or web address] - password [facility]  [facility] -  Office  [phone]  See all Orders from today for further details    Objective:   Vitals:   11/25/19 0000 11/25/19 0200 11/25/19 0400 11/25/19 1434  BP: (!) 108/49 (!) 102/46 (!) 125/43 (!) 99/44  Pulse:   91   Resp: (!) 34 (!) 28 (!) 24 19  Temp:   98.8 F (37.1 C) 97.9 F (36.6 C)  TempSrc:   Axillary Oral  SpO2:   92% 95%  Weight:      Height:        Wt Readings from Last 3 Encounters:  11/13/19 69.4 kg  10/28/19 69.4 kg  10/10/19 69.4 kg     Intake/Output Summary (Last 24 hours) at 11/25/2019 1455 Last data filed at 11/25/2019 1305 Gross per 24 hour  Intake 120 ml  Output 1450 ml  Net -1330 ml     Physical Exam  Extremely frail, chronically ill-appearing male, staff were able to sit on the recliner today Symmetrical Chest wall movement, diminished air entry bilaterally Irregular irregular, but heart rate is controlled Abdomen is soft, bowel sounds present No Cyanosis, Clubbing or edema, No new Rash or bruise        Data Review:    CBC Recent Labs  Lab 11/20/19 0447 11/21/19 0832 11/23/19 0622 11/24/19 0717 11/25/19 0332  WBC 11.8* 9.5 10.2 10.9* 12.5*  HGB 9.0* 8.1* 9.7* 8.0* 8.0*  HCT 29.2* 25.4* 31.7* 26.1* 26.4*  PLT PLATELET CLUMPS NOTED ON SMEAR, UNABLE TO ESTIMATE 258 PLATELET CLUMPS NOTED ON SMEAR, UNABLE TO ESTIMATE 198 174  MCV 92.7 91.0 93.5 95.3 95.7  MCH 28.6 29.0 28.6 29.2 29.0  MCHC 30.8 31.9 30.6 30.7 30.3  RDW 16.5* 16.7* 17.1* 17.2* 17.1*  LYMPHSABS 1.5 1.3 1.2 1.2 1.1  MONOABS 0.9 0.8 0.8 0.8 1.0  EOSABS 0.1 0.1 0.2 0.1 0.1  BASOSABS 0.0 0.0 0.0 0.0 0.0    Chemistries  Recent Labs  Lab 11/19/19 0731 11/20/19 0447 11/21/19 0832 11/22/19 0643 11/23/19 0622 11/24/19 0717 11/25/19 0332  NA 147*   < > 142 147* 149* 146* 146*  K 5.0   < > 2.8* 4.0 3.9 3.8 4.1  CL 114*   < >  109 113* 115* 116* 115*  CO2 18*   < > 22 21* 23 22 21*    GLUCOSE 183*   < > 216* 198* 174* 206* 136*  BUN 106*   < > 51* 44* 36* 41* 35*  CREATININE 3.59*   < > 2.57* 2.45* 2.34* 2.65* 2.43*  CALCIUM 9.0   < > 8.2* 8.6* 8.7* 8.0* 7.8*  AST  --    < > 40 43* 25 21 25   ALT  --    < > 30 34 30 25 29   ALKPHOS  --    < > 76 80 80 71 73  BILITOT  --    < > 1.1 1.5* 1.4* 0.7 1.3*  MG 1.9  --   --   --   --   --   --    < > = values in this interval not displayed.     ------------------------------------------------------------------------------------------------------------------ No results for input(s): CHOL, HDL, LDLCALC, TRIG, CHOLHDL, LDLDIRECT in the last 72 hours.  Lab Results  Component Value Date   HGBA1C 7.0 (H) 09/20/2019   ------------------------------------------------------------------------------------------------------------------ No results for input(s): TSH, T4TOTAL, T3FREE, THYROIDAB in the last 72 hours.  Invalid input(s): FREET3  Cardiac Enzymes No results for input(s): CKMB, TROPONINI, MYOGLOBIN in the last 168 hours.  Invalid input(s): CK ------------------------------------------------------------------------------------------------------------------    Component Value Date/Time   BNP 502.6 (H) 11/23/2019 7494    Micro Results No results found for this or any previous visit (from the past 240 hour(s)).  Radiology Reports CT Head Wo Contrast  Result Date: 10/28/2019 CLINICAL DATA:  Un witnessed fall, weakness, confusion EXAM: CT HEAD WITHOUT CONTRAST TECHNIQUE: Contiguous axial images were obtained from the base of the skull through the vertex without intravenous contrast. COMPARISON:  None. FINDINGS: Brain: Hypodensities in the periventricular white matter, most pronounced in the left frontal region, most likely reflect chronic small vessel ischemic change. No signs of acute infarct or hemorrhage. Lateral ventricles and remaining midline structures are unremarkable. No acute extra-axial fluid collections. No mass  effect. Vascular: No hyperdense vessel or unexpected calcification. Skull: Normal. Negative for fracture or focal lesion. Sinuses/Orbits: Mucoperiosteal thickening within the maxillary sinuses, right greater than left. Remaining sinuses are clear. Other: None. IMPRESSION: 1. No acute intracranial trauma. 2. Likely chronic small vessel ischemic changes within the frontal periventricular white matter. Electronically Signed   By: Randa Ngo M.D.   On: 10/28/2019 19:31   DG Chest Port 1 View  Result Date: 11/20/2019 CLINICAL DATA:  Shortness of breath, cough. EXAM: PORTABLE CHEST 1 VIEW COMPARISON:  Nov 19, 2019. FINDINGS: Stable cardiomediastinal silhouette. Sternotomy wires are noted. No pneumothorax or pleural effusion is noted. Stable fibrotic changes noted in the lung bases. No definite acute abnormality is noted. Bony thorax is unremarkable. IMPRESSION: Stable bibasilar fibrotic changes are noted. No definite acute cardiopulmonary abnormality seen. Electronically Signed   By: Marijo Conception M.D.   On: 11/20/2019 08:32   DG Chest Port 1 View  Result Date: 11/19/2019 CLINICAL DATA:  Shortness of breath EXAM: PORTABLE CHEST 1 VIEW COMPARISON:  Nov 12, 2019 chest radiograph and chest CT September 19, 2019 FINDINGS: There is fibrotic change scattered throughout the lungs, most notably in the mid and lower lung regions, essentially stable. No new opacity evident. Heart is upper normal in size with pulmonary vascularity normal. Patient is status post coronary artery bypass grafting. No adenopathy appreciable. There is aortic atherosclerosis. No evident bone lesions. IMPRESSION: Stable fibrotic type change, most  severe in the mid and lower lung regions. No new opacity evident. Stable cardiac silhouette. Postoperative changes noted. Aortic Atherosclerosis (ICD10-I70.0). Electronically Signed   By: Lowella Grip III M.D.   On: 11/19/2019 08:24   DG Chest Port 1 View  Result Date: 11/12/2019 CLINICAL DATA:   Shortness of breath and sepsis. Fever. Productive cough. EXAM: PORTABLE CHEST 1 VIEW COMPARISON:  10/28/2019.  09/19/2019. FINDINGS: The patient has a history of pulmonary fibrosis and emphysema. Chronic lung markings appear similar allowing for technical differences. Upper lobe emphysema as seen previously. No sign of dense consolidation, collapse or effusion. Previous median sternotomy and CABG. Aortic atherosclerosis. No acute bone finding. IMPRESSION: Portable film with the poor inspiration. Chronic fibrotic lung disease and upper lung emphysema. No sign of acute infiltrate, collapse or effusion. Electronically Signed   By: Nelson Chimes M.D.   On: 11/12/2019 19:37   DG Chest Portable 1 View  Result Date: 10/28/2019 CLINICAL DATA:  79 year old male with altered mental status. EXAM: PORTABLE CHEST 1 VIEW COMPARISON:  Chest radiograph dated 09/19/2019 and CT dated 09/19/2019 FINDINGS: Diffuse interstitial scarring in keeping with fibrosis. No new consolidation. There is no pleural effusion or pneumothorax. Stable cardiomediastinal silhouette. Atherosclerotic calcification of the aorta. Median sternotomy wires and CABG vascular clips. IMPRESSION: 1. No acute cardiopulmonary process. 2. Fibrotic changes. Electronically Signed   By: Anner Crete M.D.   On: 10/28/2019 21:16   ECHOCARDIOGRAM COMPLETE  Result Date: 11/14/2019    ECHOCARDIOGRAM REPORT   Patient Name:   ELISA SORLIE Date of Exam: 11/14/2019 Medical Rec #:  [id number]    Height:       68.0 in Accession #:    [id number]   Weight:       153.0 lb Date of Birth:  1940/07/25    BSA:          1.824 m Patient Age:    82 years     BP:           131/61 mmHg Patient Gender: M            HR:           58 bpm. Exam Location:  Inpatient Procedure: 2D Echo, Cardiac Doppler and Color Doppler Indications:    Elevated Troponin  History:        Patient has prior history of Echocardiogram examinations, most                 recent 09/06/2019. CHF, CAD; Risk  Factors:Hypertension,                 Dyslipidemia and Former Smoker.  Sonographer:    Vickie Epley RDCS Referring Phys: [id number] RHONDA G BARRETT  Sonographer Comments: Covid positive. IMPRESSIONS  1. Left ventricular ejection fraction, by estimation, is 60 to 65%. The left ventricle has normal function. The left ventricle has no regional wall motion abnormalities. There is mild concentric left ventricular hypertrophy. Left ventricular diastolic parameters are consistent with Grade II diastolic dysfunction (pseudonormalization). Elevated left atrial pressure.  2. Right ventricular systolic function is normal. The right ventricular size is normal. There is normal pulmonary artery systolic pressure.  3. Left atrial size was mildly dilated.  4. The mitral valve is normal in structure. Mild mitral valve regurgitation. No evidence of mitral stenosis.  5. The aortic valve is normal in structure. Aortic valve regurgitation is not visualized. No aortic stenosis is present.  6. The inferior vena cava is normal in size with greater than  50% respiratory variability, suggesting right atrial pressure of 3 mmHg. FINDINGS  Left Ventricle: Left ventricular ejection fraction, by estimation, is 60 to 65%. The left ventricle has normal function. The left ventricle has no regional wall motion abnormalities. The left ventricular internal cavity size was normal in size. There is  mild concentric left ventricular hypertrophy. Left ventricular diastolic parameters are consistent with Grade II diastolic dysfunction (pseudonormalization). Elevated left atrial pressure. Right Ventricle: The right ventricular size is normal. No increase in right ventricular wall thickness. Right ventricular systolic function is normal. There is normal pulmonary artery systolic pressure. The tricuspid regurgitant velocity is 2.25 m/s, and  with an assumed right atrial pressure of 3 mmHg, the estimated right ventricular systolic pressure is 73.4 mmHg. Left Atrium:  Left atrial size was mildly dilated. Right Atrium: Right atrial size was normal in size. Pericardium: There is no evidence of pericardial effusion. Mitral Valve: The mitral valve is normal in structure. Normal mobility of the mitral valve leaflets. Mild mitral valve regurgitation. No evidence of mitral valve stenosis. Tricuspid Valve: The tricuspid valve is normal in structure. Tricuspid valve regurgitation is not demonstrated. No evidence of tricuspid stenosis. Aortic Valve: The aortic valve is normal in structure. Aortic valve regurgitation is not visualized. No aortic stenosis is present. Pulmonic Valve: The pulmonic valve was normal in structure. Pulmonic valve regurgitation is not visualized. No evidence of pulmonic stenosis. Aorta: The aortic root is normal in size and structure. Venous: The inferior vena cava is normal in size with greater than 50% respiratory variability, suggesting right atrial pressure of 3 mmHg. IAS/Shunts: No atrial level shunt detected by color flow Doppler.  LEFT VENTRICLE PLAX 2D LVIDd:         4.70 cm      Diastology LVIDs:         3.42 cm      LV e' lateral:   7.14 cm/s LV PW:         0.80 cm      LV E/e' lateral: 12.3 LV IVS:        0.80 cm      LV e' medial:    4.62 cm/s LVOT diam:     1.90 cm      LV E/e' medial:  19.0 LV SV:         45 LV SV Index:   25 LVOT Area:     2.84 cm  LV Volumes (MOD) LV vol d, MOD A2C: 103.0 ml LV vol d, MOD A4C: 115.0 ml LV vol s, MOD A2C: 61.9 ml LV vol s, MOD A4C: 46.6 ml LV SV MOD A2C:     41.1 ml LV SV MOD A4C:     115.0 ml LV SV MOD BP:      57.4 ml RIGHT VENTRICLE RV S prime:     10.10 cm/s TAPSE (M-mode): 1.6 cm LEFT ATRIUM             Index       RIGHT ATRIUM           Index LA diam:        4.20 cm 2.30 cm/m  RA Area:     10.30 cm LA Vol (A2C):   24.4 ml 13.38 ml/m RA Volume:   18.10 ml  9.92 ml/m LA Vol (A4C):   33.3 ml 18.26 ml/m LA Biplane Vol: 31.4 ml 17.22 ml/m  AORTIC VALVE LVOT Vmax:   70.00 cm/s LVOT Vmean:  40.000 cm/s LVOT  VTI:  0.160 m  AORTA Ao Root diam: 3.10 cm MITRAL VALVE               TRICUSPID VALVE MV Area (PHT): 5.02 cm    TR Peak grad:   20.2 mmHg MV Decel Time: 151 msec    TR Vmax:        225.00 cm/s MV E velocity: 87.80 cm/s MV A velocity: 70.30 cm/s  SHUNTS MV E/A ratio:  1.25        Systemic VTI:  0.16 m                            Systemic Diam: 1.90 cm Ena Dawley MD Electronically signed by Ena Dawley MD Signature Date/Time: 11/14/2019/1:13:59 PM    Final

## 2019-11-26 DIAGNOSIS — D649 Anemia, unspecified: Secondary | ICD-10-CM

## 2019-11-26 LAB — HEMOGLOBIN AND HEMATOCRIT, BLOOD
HCT: 27.4 % — ABNORMAL LOW (ref 39.0–52.0)
Hemoglobin: 8.8 g/dL — ABNORMAL LOW (ref 13.0–17.0)

## 2019-11-26 LAB — BRAIN NATRIURETIC PEPTIDE: B Natriuretic Peptide: 259.2 pg/mL — ABNORMAL HIGH (ref 0.0–100.0)

## 2019-11-26 LAB — COMPREHENSIVE METABOLIC PANEL
ALT: 25 U/L (ref 0–44)
AST: 22 U/L (ref 15–41)
Albumin: 2 g/dL — ABNORMAL LOW (ref 3.5–5.0)
Alkaline Phosphatase: 66 U/L (ref 38–126)
Anion gap: 11 (ref 5–15)
BUN: 34 mg/dL — ABNORMAL HIGH (ref 8–23)
CO2: 20 mmol/L — ABNORMAL LOW (ref 22–32)
Calcium: 7.8 mg/dL — ABNORMAL LOW (ref 8.9–10.3)
Chloride: 110 mmol/L (ref 98–111)
Creatinine, Ser: 2.29 mg/dL — ABNORMAL HIGH (ref 0.61–1.24)
GFR calc Af Amer: 31 mL/min — ABNORMAL LOW (ref >60)
GFR calc non Af Amer: 27 mL/min — ABNORMAL LOW (ref >60)
Glucose, Bld: 133 mg/dL — ABNORMAL HIGH (ref 70–99)
Potassium: 3.9 mmol/L (ref 3.5–5.1)
Sodium: 141 mmol/L (ref 135–145)
Total Bilirubin: 1.1 mg/dL (ref 0.3–1.2)
Total Protein: 5.3 g/dL — ABNORMAL LOW (ref 6.5–8.1)

## 2019-11-26 LAB — CBC WITH DIFFERENTIAL/PLATELET
Abs Immature Granulocytes: 0.2 10*3/uL — ABNORMAL HIGH (ref 0.00–0.07)
Basophils Absolute: 0 10*3/uL (ref 0.0–0.1)
Basophils Relative: 0 %
Eosinophils Absolute: 0.1 10*3/uL (ref 0.0–0.5)
Eosinophils Relative: 1 %
HCT: 22.5 % — ABNORMAL LOW (ref 39.0–52.0)
Hemoglobin: 6.9 g/dL — CL (ref 13.0–17.0)
Immature Granulocytes: 2 %
Lymphocytes Relative: 12 %
Lymphs Abs: 1.3 10*3/uL (ref 0.7–4.0)
MCH: 29.1 pg (ref 26.0–34.0)
MCHC: 30.7 g/dL (ref 30.0–36.0)
MCV: 94.9 fL (ref 80.0–100.0)
Monocytes Absolute: 1 10*3/uL (ref 0.1–1.0)
Monocytes Relative: 9 %
Neutro Abs: 8.7 10*3/uL — ABNORMAL HIGH (ref 1.7–7.7)
Neutrophils Relative %: 76 %
Platelets: 185 10*3/uL (ref 150–400)
RBC: 2.37 MIL/uL — ABNORMAL LOW (ref 4.22–5.81)
RDW: 16.7 % — ABNORMAL HIGH (ref 11.5–15.5)
WBC: 11.3 10*3/uL — ABNORMAL HIGH (ref 4.0–10.5)
nRBC: 0.2 % (ref 0.0–0.2)

## 2019-11-26 LAB — GLUCOSE, CAPILLARY
Glucose-Capillary: 135 mg/dL — ABNORMAL HIGH (ref 70–99)
Glucose-Capillary: 139 mg/dL — ABNORMAL HIGH (ref 70–99)
Glucose-Capillary: 165 mg/dL — ABNORMAL HIGH (ref 70–99)
Glucose-Capillary: 84 mg/dL (ref 70–99)

## 2019-11-26 LAB — C-REACTIVE PROTEIN: CRP: 15.4 mg/dL — ABNORMAL HIGH (ref <1.0)

## 2019-11-26 LAB — D-DIMER, QUANTITATIVE: D-Dimer, Quant: 1.6 ug/mL-FEU — ABNORMAL HIGH (ref 0.00–0.50)

## 2019-11-26 LAB — PREPARE RBC (CROSSMATCH)

## 2019-11-26 MED ORDER — SODIUM CHLORIDE 0.45 % IV SOLN: INTRAVENOUS | Status: DC

## 2019-11-26 MED ORDER — SODIUM CHLORIDE 0.9% IV SOLUTION: Freq: Once | INTRAVENOUS | Status: AC

## 2019-11-26 MED ORDER — LOPERAMIDE HCL 2 MG PO CAPS
2.0000 mg | ORAL_CAPSULE | Freq: Once | ORAL | Status: AC
  Administered 2019-11-26: 2 mg via ORAL
  Filled 2019-11-26: qty 1

## 2019-11-26 MED ORDER — PRO-STAT SUGAR FREE PO LIQD
30.0000 mL | Freq: Two times a day (BID) | ORAL | Status: DC
  Administered 2019-11-26 – 2019-11-30 (×9): 30 mL via ORAL
  Filled 2019-11-26 (×9): qty 30

## 2019-11-26 MED ORDER — ENSURE ENLIVE PO LIQD
237.0000 mL | Freq: Two times a day (BID) | ORAL | Status: DC
  Administered 2019-11-26 – 2019-11-30 (×9): 237 mL via ORAL

## 2019-11-26 NOTE — Progress Notes (Signed)
  Speech Language Pathology Treatment: Dysphagia  Patient Details Name: Robert Little MRN: [id number] DOB: 1941-05-06 Today's Date: 11/26/2019 Time: 3893-7342 SLP Time Calculation (min) (ACUTE ONLY): 15 min  Assessment / Plan / Recommendation Clinical Impression  Robert Little seen with nectar thick liquids and thin trials for possible upgrade. No coughing prior to PO presentation however delayed cough followed both consistencies. His oral control is decreased and needs cues to pace for smaller sips. Masticated regular texture with timely transit and clear oral cavity. Recommend continue nectar thick liquids, Dys 2 texture and MBS on Wed 5/26 for potential upgrade.     HPI HPI: Robert Little is a 79 y.o. male with history of GERD,schizoaffective schizophrenia, dysphagia, CAD, pulmonary fibrosis on home oxygen Covid test came back positive. BSE 09/18/19 placed on nectar and Dys 3.  MBS 3/17 revealing delayed swallow, penetration (PAS 3) with thin via straw, no aspiration however increased risk therefore continued nectar thick. CXR Stable bibasilar fibrotic changes are noted. No definite acute      SLP Plan  Continue with current plan of care       Recommendations  Diet recommendations: Nectar-thick liquid;Dysphagia 2 (fine chop) Liquids provided via: Cup;Straw Medication Administration: Whole meds with puree Supervision: Staff to assist with self feeding;Full supervision/cueing for compensatory strategies Compensations: Minimize environmental distractions;Slow rate;Small sips/bites Postural Changes and/or Swallow Maneuvers: Seated upright 90 degrees                Oral Care Recommendations: Oral care BID Follow up Recommendations: (TBD) SLP Visit Diagnosis: Dysphagia, unspecified (R13.10) Plan: Continue with current plan of care       GO                Robert Little 11/26/2019, 4:16 PM

## 2019-11-26 NOTE — Progress Notes (Signed)
CRITICAL VALUE ALERT  Critical Value:  Hemoglobin 6.9  Date & Time Notied:  11/26/19 2481  Provider Notified: C. Fair @0313   Orders Received/Actions taken: 1 unit PRBCs - MD received POA verbal consent

## 2019-11-26 NOTE — Progress Notes (Signed)
Received page about Hgb 6.9. Reviewed chart. Significant CAD hx. Patient oriented only to self. Called and spoke with POA [name]). Discussed risks/benefits of blood transfusion and agreeable. 1 unit ordered with repeat H&H.  Barrington Ellison, MD [facility]

## 2019-11-26 NOTE — Progress Notes (Signed)
PROGRESS NOTE                                                                                                                                                                                                             Patient Demographics:    Robert Little, is a 79 y.o. male, DOB - 1940-12-25, WVP:[id number]  Outpatient Primary MD for the patient is [name]    LOS - 14  Admit date - 11/12/2019    Chief Complaint  Patient presents with  . Shortness of Breath       Brief Narrative   Robert Little is a 79 y.o. male with history of his affective disorder, CAD, pulmonary fibrosis on home oxygen usually 2 L anemia hypertension diabetes mellitus recently admitted for hypoglycemia with use of sulfonylureas in the setting of chronic kidney disease was brought to the ER after patient has been having productive cough over the last 3 days feeling weak and tired not eating well.  ER he was diagnosed with acute hypoxic respiratory failure due to COVID-19 pneumonia, dehydration AKI and NSTEMI.  Was complicated by runs of A. fib with RVR, dehydration, hypotension and AKI.   Subjective:   Patient in bed, denies any complaints, he had brown  color BM x2, no evidence of melena any bleed .   Assessment  & Plan :   Sepsis with Acute Hypoxic Resp. Failure due to Acute Covid 19 Viral Pneumonitis during the ongoing 2020 Covid 19 Pandemic  -Patient is with moderate disease,. -He was treated with IV remdesivir. -He was treated with IV steroids as well. -Currently hypoxia back to baseline, he is on 2 L nasal cannula at baseline. -Overall extremely frail, deconditioned, high risk for decompensation. -CRP and D-dimers remains significantly remain significantly elevated, despite patient is more than 10 days, and receiving appropriate treatment, which is concerning, continue to monitor . -Patient extremely frail and  debilitated, not able to comply fully with incentive spirometry, and out of bed to chair.   SpO2: 94 % O2 Flow Rate (L/min): 2 L/min  Recent Labs  Lab 11/20/19 0447 11/20/19 0447 11/21/19 0832 11/21/19 0832 11/22/19 0643 11/22/19 1346 11/23/19 0622 11/24/19 0717 11/25/19 0332 11/26/19 0230  CRP 5.2*   < > 10.7*   < > 12.0*  --  12.1* 6.4* 10.2* 15.4*  DDIMER 1.11*   < > 2.00*   < > 2.40*  --  1.36* 1.57* 1.05* 1.60*  BNP 232.8*   < > 396.7*  --  618.3* 749.8* 502.6*  --   --  259.2*  PROCALCITON 0.12  --  0.19  --   --   --   --   --   --   --    < > = values in this interval not displayed.    Hepatic Function Latest Ref Rng & Units 11/26/2019 11/25/2019 11/24/2019  Total Protein 6.5 - 8.1 g/dL 5.3(L) 5.8(L) 5.8(L)  Albumin 3.5 - 5.0 g/dL 2.0(L) 2.1(L) 2.3(L)  AST 15 - 41 U/L 22 25 21   ALT 0 - 44 U/L 25 29 25   Alk Phosphatase 38 - 126 U/L 66 73 71  Total Bilirubin 0.3 - 1.2 mg/dL 1.1 1.3(H) 0.7    Dehydration with AKI on underlying CKD 4  -Baseline creatinine 2.5, at some point he had worsening renal function, and some evidence of volume depletion and low blood pressure, he required fluid boluses intermittently . -Blood pressure remains soft, continue with IV fluids .  Hypernatremia -This has resolved with D5W, will change to half-normal saline..  Paroxysmal atrial fibrillation with Mal[place] vas 2 score of at least 3 with RVR and Hypotension  -Heart rate was always difficult to control in the setting of soft blood pressure, and holding beta-blockers, patient was started on midodrine, and IV fluid to assist with blood pressure, now he is on Coreg with better blood pressure control, as we will continue with amiodarone . -Previous MD discussed with cardiologist Dr. Ellyn Hack, extremely high risk candidate for anticoagulation due to fall risk and frail status, aspirin, rate better will be placed on  with oral amiodarone.    History of CAD.  - Not NSTEMI.  Could be due to demand  ischemia from hypoxia, continue aspirin, statin and beta-blocker for secondary prevention, stable echocardiogram, stable EKG and chest pain-free seen by cardiology continue present care with outpatient cardiology follow-up post discharge.  History of schizoaffective disorder -  takes monthly injection of Invega and daily on Klonopin and Cogentin.  - Follows at [facility] follow up.  -  According to the POA ports his [name] has been stopped by his primary psychiatrist in April of this year, given worsening renal function , so I have discontinued his Cogentin there inpatient psych recommendation . -He is back on his Seroquel (held briefly secondary to sedation couple days ago), and Klonopin dose has been lowered to nightly only.  History of pulmonary fibrosis now on 2 L home oxygen.  - Continue supportive care with a liters as needed.  Outpatient pulmonary follow-up.  Anemia of chronic disease. -Hemoglobin has dropped to 6.9 this morning, no evidence of GI bleed, patient was transfused 1 unit PRBC especially with his known history of CAD.  Chronic diastolic CHF.  EF on recent echocardiogram was 60%.  Appears dehydrated, hold Lasix, continue with IV fluids .  Chronically elevated D-dimers.   - Previously has had negative lower extremity venous duplex, he is on subcu heparin for DVT prophylaxis  Essential hypertension. -Your issue during hospital stay is hypotension, this has improved with IV fluids, low-dose Midodrin, especially he needs to blockers for his A. fib with uncontrolled heart rate.  Hypokalemia - repleted.   DM type II.  For now sliding scale, addedm low-dose Lantus as CBGs are running high due to steroid use.   Lab Results  Component Value Date   HGBA1C 7.0 (H) 09/20/2019   CBG (last 3)  Recent Labs    11/25/19 2108 11/26/19 0746 11/26/19 1206  GLUCAP 134* 135* 139*     Condition - Extremely Guarded  Family Communication  : Discussed with POA Mr.  Robert Little 5/24 and have updated him.  Code Status :  DNR  Consults  :  Cards, Psych  Disposition Plan  :    Status is: Inpatient  Remains inpatient appropriate because:Inpatient level of care appropriate due to severity of illness  Dispo: The patient is from: Group home              Anticipated d/c is to: Group home              Anticipated d/c date is: 3 days              Patient currently is not medically stable to d/c. Covid, Afib RVR, AKI     Procedures  :    TTE - 1. Left ventricular ejection fraction, by estimation, is 60 to 65%. The left ventricle has normal function. The left ventricle has no regional wall motion abnormalities. There is mild concentric left ventricular hypertrophy. Left ventricular diastolic parameters are consistent with Grade II diastolic dysfunction  (pseudonormalization). Elevated left atrial pressure.  2. Right ventricular systolic function is normal. The right ventricular size is normal. There is normal pulmonary artery systolic pressure.  3. Left atrial size was mildly dilated.  4. The mitral valve is normal in structure. Mild mitral valve regurgitation. No evidence of mitral stenosis.  5. The aortic valve is normal in structure. Aortic valve regurgitation is not visualized. No aortic stenosis is present.  6. The inferior vena cava is normal in size with greater than 50% respiratory variability, suggesting right atrial pressure of 3 mmHg.   PUD Prophylaxis :  None  DVT Prophylaxis  :   Heparin   Lab Results  Component Value Date   PLT 185 11/26/2019    Diet :  Diet Order            DIET DYS 2 Room service appropriate? No; Fluid consistency: Nectar Thick  Diet effective now               Inpatient Medications  Scheduled Meds: . albuterol  2 puff Inhalation Q2200  . amiodarone  200 mg Oral Daily  . aspirin EC  81 mg Oral Daily  . atorvastatin  20 mg Oral QHS  . carvedilol  3.125 mg Oral BID WC  . Chlorhexidine Gluconate Cloth   6 each Topical Daily  . clonazePAM  0.5 mg Oral QHS  . ferrous sulfate  325 mg Oral BID WC  . fluticasone furoate-vilanterol  1 puff Inhalation Daily  . heparin injection (subcutaneous)  5,000 Units Subcutaneous Q8H  . insulin aspart  0-6 Units Subcutaneous TID WC  . insulin aspart  3 Units Subcutaneous TID WC  . lidocaine  1 application Urethral Once  . midodrine  5 mg Oral TID WC  . pantoprazole  40 mg Oral Daily  . QUEtiapine  25 mg Oral QHS  . tamsulosin  0.4 mg Oral Daily  . umeclidinium bromide  1 puff Inhalation Daily  . vitamin B-12  500 mcg Oral BID   Continuous Infusions: . sodium chloride 50 mL/hr at 11/26/19 0831   PRN Meds:.acetaminophen, ALPRAZolam, metoprolol tartrate, [DISCONTINUED] ondansetron **OR** ondansetron (ZOFRAN) IV, Resource ThickenUp Clear, zolpidem  Antibiotics  :  Anti-infectives (From admission, onward)   Start     Dose/Rate Route Frequency Ordered Stop   11/13/19 1600  remdesivir 100 mg in sodium chloride 0.9 % 100 mL IVPB     100 mg 200 mL/hr over 30 Minutes Intravenous Daily 11/12/19 2146 11/16/19 1700   11/12/19 2300  remdesivir 200 mg in sodium chloride 0.9% 250 mL IVPB     200 mg 580 mL/hr over 30 Minutes Intravenous Once 11/12/19 2146 11/13/19 0104   11/12/19 2130  cefTRIAXone (ROCEPHIN) 2 g in sodium chloride 0.9 % 100 mL IVPB  Status:  Discontinued     2 g 200 mL/hr over 30 Minutes Intravenous  Once 11/12/19 2056 11/12/19 2125   11/12/19 2130  azithromycin (ZITHROMAX) 500 mg in sodium chloride 0.9 % 250 mL IVPB  Status:  Discontinued     500 mg 250 mL/hr over 60 Minutes Intravenous  Once 11/12/19 2056 11/12/19 2125        Emeline Gins Leif Loflin M.D on 11/26/2019 at 2:34 PM  To page go to [email or web address] - password [email or web address]  See all Orders from today for further details    Objective:   Vitals:   11/26/19 1146 11/26/19 1200 11/26/19 1309 11/26/19 1400  BP:  (!) 95/43 (!) 93/45 (!) 99/58  Pulse:  77 75   Resp: (!) 24 (!) 26 (!) 21 20    Temp:  98 F (36.7 C) 97.8 F (36.6 C) 97.9 F (36.6 C)  TempSrc:  Oral Oral Axillary  SpO2:  93% 95% 94%  Weight:      Height:        Wt Readings from Last 3 Encounters:  11/13/19 69.4 kg  10/28/19 69.4 kg  10/10/19 69.4 kg     Intake/Output Summary (Last 24 hours) at 11/26/2019 1434 Last data filed at 11/26/2019 9381 Gross per 24 hour  Intake 685.34 ml  Output 750 ml  Net -64.66 ml     Physical Exam  Extremely frail, chronically ill-appearing male, staff were able to sit on the recliner today Symmetrical Chest wall movement, diminished air entry bilaterally Irregular irregular, but heart rate is controlled Abdomen is soft, bowel sounds present No Cyanosis, Clubbing or edema, No new Rash or bruise        Data Review:    CBC Recent Labs  Lab 11/21/19 0832 11/23/19 0622 11/24/19 0717 11/25/19 0332 11/26/19 0230  WBC 9.5 10.2 10.9* 12.5* 11.3*  HGB 8.1* 9.7* 8.0* 8.0* 6.9*  HCT 25.4* 31.7* 26.1* 26.4* 22.5*  PLT 258 PLATELET CLUMPS NOTED ON SMEAR, UNABLE TO ESTIMATE 198 174 185  MCV 91.0 93.5 95.3 95.7 94.9  MCH 29.0 28.6 29.2 29.0 29.1  MCHC 31.9 30.6 30.7 30.3 30.7  RDW 16.7* 17.1* 17.2* 17.1* 16.7*  LYMPHSABS 1.3 1.2 1.2 1.1 1.3  MONOABS 0.8 0.8 0.8 1.0 1.0  EOSABS 0.1 0.2 0.1 0.1 0.1  BASOSABS 0.0 0.0 0.0 0.0 0.0    Chemistries  Recent Labs  Lab 11/22/19 0643 11/23/19 0622 11/24/19 0717 11/25/19 0332 11/26/19 0230  NA 147* 149* 146* 146* 141  K 4.0 3.9 3.8 4.1 3.9  CL 113* 115* 116* 115* 110  CO2 21* 23 22 21* 20*  GLUCOSE 198* 174* 206* 136* 133*  BUN 44* 36* 41* 35* 34*  CREATININE 2.45* 2.34* 2.65* 2.43* 2.29*  CALCIUM 8.6* 8.7* 8.0* 7.8* 7.8*  AST 43* 25 21 25 22   ALT 34 30 25 29 25   ALKPHOS 80 80 71 73 66  BILITOT 1.5* 1.4* 0.7  1.3* 1.1     ------------------------------------------------------------------------------------------------------------------ No results for input(s): CHOL, HDL, LDLCALC, TRIG, CHOLHDL, LDLDIRECT in  the last 72 hours.  Lab Results  Component Value Date   HGBA1C 7.0 (H) 09/20/2019   ------------------------------------------------------------------------------------------------------------------ No results for input(s): TSH, T4TOTAL, T3FREE, THYROIDAB in the last 72 hours.  Invalid input(s): FREET3  Cardiac Enzymes No results for input(s): CKMB, TROPONINI, MYOGLOBIN in the last 168 hours.  Invalid input(s): CK ------------------------------------------------------------------------------------------------------------------    Component Value Date/Time   BNP 259.2 (H) 11/26/2019 0230    Micro Results No results found for this or any previous visit (from the past 240 hour(s)).  Radiology Reports CT Head Wo Contrast  Result Date: 10/28/2019 CLINICAL DATA:  Un witnessed fall, weakness, confusion EXAM: CT HEAD WITHOUT CONTRAST TECHNIQUE: Contiguous axial images were obtained from the base of the skull through the vertex without intravenous contrast. COMPARISON:  None. FINDINGS: Brain: Hypodensities in the periventricular white matter, most pronounced in the left frontal region, most likely reflect chronic small vessel ischemic change. No signs of acute infarct or hemorrhage. Lateral ventricles and remaining midline structures are unremarkable. No acute extra-axial fluid collections. No mass effect. Vascular: No hyperdense vessel or unexpected calcification. Skull: Normal. Negative for fracture or focal lesion. Sinuses/Orbits: Mucoperiosteal thickening within the maxillary sinuses, right greater than left. Remaining sinuses are clear. Other: None. IMPRESSION: 1. No acute intracranial trauma. 2. Likely chronic small vessel ischemic changes within the frontal periventricular white matter. Electronically Signed   By: Randa Ngo M.D.   On: 10/28/2019 19:31   DG Chest Port 1 View  Result Date: 11/20/2019 CLINICAL DATA:  Shortness of breath, cough. EXAM: PORTABLE CHEST 1 VIEW COMPARISON:   Nov 19, 2019. FINDINGS: Stable cardiomediastinal silhouette. Sternotomy wires are noted. No pneumothorax or pleural effusion is noted. Stable fibrotic changes noted in the lung bases. No definite acute abnormality is noted. Bony thorax is unremarkable. IMPRESSION: Stable bibasilar fibrotic changes are noted. No definite acute cardiopulmonary abnormality seen. Electronically Signed   By: Marijo Conception M.D.   On: 11/20/2019 08:32   DG Chest Port 1 View  Result Date: 11/19/2019 CLINICAL DATA:  Shortness of breath EXAM: PORTABLE CHEST 1 VIEW COMPARISON:  Nov 12, 2019 chest radiograph and chest CT September 19, 2019 FINDINGS: There is fibrotic change scattered throughout the lungs, most notably in the mid and lower lung regions, essentially stable. No new opacity evident. Heart is upper normal in size with pulmonary vascularity normal. Patient is status post coronary artery bypass grafting. No adenopathy appreciable. There is aortic atherosclerosis. No evident bone lesions. IMPRESSION: Stable fibrotic type change, most severe in the mid and lower lung regions. No new opacity evident. Stable cardiac silhouette. Postoperative changes noted. Aortic Atherosclerosis (ICD10-I70.0). Electronically Signed   By: Lowella Grip III M.D.   On: 11/19/2019 08:24   DG Chest Port 1 View  Result Date: 11/12/2019 CLINICAL DATA:  Shortness of breath and sepsis. Fever. Productive cough. EXAM: PORTABLE CHEST 1 VIEW COMPARISON:  10/28/2019.  09/19/2019. FINDINGS: The patient has a history of pulmonary fibrosis and emphysema. Chronic lung markings appear similar allowing for technical differences. Upper lobe emphysema as seen previously. No sign of dense consolidation, collapse or effusion. Previous median sternotomy and CABG. Aortic atherosclerosis. No acute bone finding. IMPRESSION: Portable film with the poor inspiration. Chronic fibrotic lung disease and upper lung emphysema. No sign of acute infiltrate, collapse or effusion.  Electronically Signed   By: Nelson Chimes M.D.   On: 11/12/2019 19:37  DG Chest Portable 1 View  Result Date: 10/28/2019 CLINICAL DATA:  79 year old male with altered mental status. EXAM: PORTABLE CHEST 1 VIEW COMPARISON:  Chest radiograph dated 09/19/2019 and CT dated 09/19/2019 FINDINGS: Diffuse interstitial scarring in keeping with fibrosis. No new consolidation. There is no pleural effusion or pneumothorax. Stable cardiomediastinal silhouette. Atherosclerotic calcification of the aorta. Median sternotomy wires and CABG vascular clips. IMPRESSION: 1. No acute cardiopulmonary process. 2. Fibrotic changes. Electronically Signed   By: Anner Crete M.D.   On: 10/28/2019 21:16   ECHOCARDIOGRAM COMPLETE  Result Date: 11/14/2019    ECHOCARDIOGRAM REPORT   Patient Name:   Robert Little Date of Exam: 11/14/2019 Medical Rec #:  [id number]    Height:       68.0 in Accession #:    [id number]   Weight:       153.0 lb Date of Birth:  04/23/1941    BSA:          1.824 m Patient Age:    17 years     BP:           131/61 mmHg Patient Gender: M            HR:           58 bpm. Exam Location:  Inpatient Procedure: 2D Echo, Cardiac Doppler and Color Doppler Indications:    Elevated Troponin  History:        Patient has prior history of Echocardiogram examinations, most                 recent 09/06/2019. CHF, CAD; Risk Factors:Hypertension,                 Dyslipidemia and Former Smoker.  Sonographer:    Vickie Epley RDCS Referring Phys: [id number] RHONDA G BARRETT  Sonographer Comments: Covid positive. IMPRESSIONS  1. Left ventricular ejection fraction, by estimation, is 60 to 65%. The left ventricle has normal function. The left ventricle has no regional wall motion abnormalities. There is mild concentric left ventricular hypertrophy. Left ventricular diastolic parameters are consistent with Grade II diastolic dysfunction (pseudonormalization). Elevated left atrial pressure.  2. Right ventricular systolic function is normal. The  right ventricular size is normal. There is normal pulmonary artery systolic pressure.  3. Left atrial size was mildly dilated.  4. The mitral valve is normal in structure. Mild mitral valve regurgitation. No evidence of mitral stenosis.  5. The aortic valve is normal in structure. Aortic valve regurgitation is not visualized. No aortic stenosis is present.  6. The inferior vena cava is normal in size with greater than 50% respiratory variability, suggesting right atrial pressure of 3 mmHg. FINDINGS  Left Ventricle: Left ventricular ejection fraction, by estimation, is 60 to 65%. The left ventricle has normal function. The left ventricle has no regional wall motion abnormalities. The left ventricular internal cavity size was normal in size. There is  mild concentric left ventricular hypertrophy. Left ventricular diastolic parameters are consistent with Grade II diastolic dysfunction (pseudonormalization). Elevated left atrial pressure. Right Ventricle: The right ventricular size is normal. No increase in right ventricular wall thickness. Right ventricular systolic function is normal. There is normal pulmonary artery systolic pressure. The tricuspid regurgitant velocity is 2.25 m/s, and  with an assumed right atrial pressure of 3 mmHg, the estimated right ventricular systolic pressure is 38.4 mmHg. Left Atrium: Left atrial size was mildly dilated. Right Atrium: Right atrial size was normal in size. Pericardium: There is no evidence of pericardial effusion.  Mitral Valve: The mitral valve is normal in structure. Normal mobility of the mitral valve leaflets. Mild mitral valve regurgitation. No evidence of mitral valve stenosis. Tricuspid Valve: The tricuspid valve is normal in structure. Tricuspid valve regurgitation is not demonstrated. No evidence of tricuspid stenosis. Aortic Valve: The aortic valve is normal in structure. Aortic valve regurgitation is not visualized. No aortic stenosis is present. Pulmonic Valve: The  pulmonic valve was normal in structure. Pulmonic valve regurgitation is not visualized. No evidence of pulmonic stenosis. Aorta: The aortic root is normal in size and structure. Venous: The inferior vena cava is normal in size with greater than 50% respiratory variability, suggesting right atrial pressure of 3 mmHg. IAS/Shunts: No atrial level shunt detected by color flow Doppler.  LEFT VENTRICLE PLAX 2D LVIDd:         4.70 cm      Diastology LVIDs:         3.42 cm      LV e' lateral:   7.14 cm/s LV PW:         0.80 cm      LV E/e' lateral: 12.3 LV IVS:        0.80 cm      LV e' medial:    4.62 cm/s LVOT diam:     1.90 cm      LV E/e' medial:  19.0 LV SV:         45 LV SV Index:   25 LVOT Area:     2.84 cm  LV Volumes (MOD) LV vol d, MOD A2C: 103.0 ml LV vol d, MOD A4C: 115.0 ml LV vol s, MOD A2C: 61.9 ml LV vol s, MOD A4C: 46.6 ml LV SV MOD A2C:     41.1 ml LV SV MOD A4C:     115.0 ml LV SV MOD BP:      57.4 ml RIGHT VENTRICLE RV S prime:     10.10 cm/s TAPSE (M-mode): 1.6 cm LEFT ATRIUM             Index       RIGHT ATRIUM           Index LA diam:        4.20 cm 2.30 cm/m  RA Area:     10.30 cm LA Vol (A2C):   24.4 ml 13.38 ml/m RA Volume:   18.10 ml  9.92 ml/m LA Vol (A4C):   33.3 ml 18.26 ml/m LA Biplane Vol: 31.4 ml 17.22 ml/m  AORTIC VALVE LVOT Vmax:   70.00 cm/s LVOT Vmean:  40.000 cm/s LVOT VTI:    0.160 m  AORTA Ao Root diam: 3.10 cm MITRAL VALVE               TRICUSPID VALVE MV Area (PHT): 5.02 cm    TR Peak grad:   20.2 mmHg MV Decel Time: 151 msec    TR Vmax:        225.00 cm/s MV E velocity: 87.80 cm/s MV A velocity: 70.30 cm/s  SHUNTS MV E/A ratio:  1.25        Systemic VTI:  0.16 m                            Systemic Diam: 1.90 cm Ena Dawley MD Electronically signed by Ena Dawley MD Signature Date/Time: 11/14/2019/1:13:59 PM    Final

## 2019-11-26 NOTE — Progress Notes (Signed)
Physical Therapy Treatment Patient Details Name: Robert Little MRN: [id number] DOB: Dec 15, 1940 Today's Date: 11/26/2019    History of Present Illness 79 y.o. male with medical history significant for chronic anemia, pulmonary fibrosis, hypertension, coronary artery disease, chronic diastolic CHF, and schizoaffective disorder, presenting to the emergency department with progressive shortness of breath long with severe orthopnea starting 1 to 2 days prior to hospital visit, in the hospital work-up suggestive of CHF and acute hypoxic respiratory failure and he was admitted 09/16/19    PT Comments    Pt was much more alert today and has 1 unit of PRBCs earlier.  BPs remain soft and he is dyspnic with exertion (I put him on 2 L O2 [place]--unsure of O2 reading due to poor wave via earlobe probe while moving).  He sat up for several hours mid day in the chair and managed to get himself out of the alarm belt without it going off.  He is becoming more aware of his environment, reporting accurately the events of his day.  He was able to stand and participate in pre gait marching with RW x 3 bouts.  PT will continue to follow acutely for safe mobility progression.  HR max was 127 during mobility.    Follow Up Recommendations  SNF     Equipment Recommendations  Wheelchair (measurements PT);Wheelchair cushion (measurements PT);Hospital bed    Recommendations for Other Services   NA     Precautions / Restrictions Precautions Precautions: Fall;Other (comment) Precaution Comments: monitor O2 and BPs    Mobility  Bed Mobility Overal bed mobility: Needs Assistance Bed Mobility: Supine to Sit;Sit to Supine     Supine to sit: Mod assist;HOB elevated Sit to supine: Mod assist;HOB elevated   General bed mobility comments: Mod assist to support trunk to come to sitting and lift both legs to return to supine.   Transfers Overall transfer level: Needs assistance Equipment used: Rolling walker (2  wheeled) Transfers: Sit to/from Stand Sit to Stand: Mod assist         General transfer comment: Mod assist to stand from EOB x 3, assist to power up over weak legs.    Ambulation/Gait             General Gait Details: Would need second person for gait progression, he is weak and could buckle.          Balance Overall balance assessment: Needs assistance Sitting-balance support: Feet supported;Bilateral upper extremity supported Sitting balance-Leahy Scale: Poor Sitting balance - Comments: at times min guard assist due to posterior preference, especially while scooting.    Standing balance support: Bilateral upper extremity supported Standing balance-Leahy Scale: Poor Standing balance comment: needs external support from RW and therapist.                             Cognition Arousal/Alertness: Awake/alert Behavior During Therapy: Impulsive;Restless Overall Cognitive Status: Impaired/Different from baseline Area of Impairment: Orientation;Attention;Memory;Following commands;Safety/judgement;Awareness;Problem solving                 Orientation Level: Disoriented to;Person;Time;Situation Current Attention Level: Sustained Memory: Decreased recall of precautions;Decreased short-term memory Following Commands: Follows one step commands inconsistently Safety/Judgement: Decreased awareness of safety;Decreased awareness of deficits Awareness: Intellectual Problem Solving: Difficulty sequencing;Requires verbal cues;Requires tactile cues;Decreased initiation General Comments: Pt is more alert and cognitively intact than last session, able to recall he got up on his own and was not supposed to do that, he  was aware he got blood and reported weakness in standing.  He is better able to carry a conversation today.          General Comments General comments (skin integrity, edema, etc.): Pt with increased DOE in sitting EOB.  HR 127, poor wave on earlobe  probe, so not sure what his O2 sat reading was.  He was on RA, so I put 2 L O2 [place] on during mobility.        Pertinent Vitals/Pain Pain Assessment: Faces Pain Score: 0-No pain Faces Pain Scale: No hurt           PT Goals (current goals can now be found in the care plan section) Acute Rehab PT Goals Patient Stated Goal: Not stated Progress towards PT goals: Progressing toward goals    Frequency    Min 2X/week      PT Plan Current plan remains appropriate       AM-PAC PT "6 Clicks" Mobility   Outcome Measure  Help needed turning from your back to your side while in a flat bed without using bedrails?: A Lot Help needed moving from lying on your back to sitting on the side of a flat bed without using bedrails?: A Lot Help needed moving to and from a bed to a chair (including a wheelchair)?: A Lot Help needed standing up from a chair using your arms (e.g., wheelchair or bedside chair)?: A Lot Help needed to walk in hospital room?: Total Help needed climbing 3-5 steps with a railing? : Total 6 Click Score: 10    End of Session Equipment Utilized During Treatment: Oxygen Activity Tolerance: Patient limited by fatigue Patient left: in bed;with call bell/phone within reach;with bed alarm set Nurse Communication: Mobility status PT Visit Diagnosis: Muscle weakness (generalized) (M62.81);Difficulty in walking, not elsewhere classified (R26.2)     Time: 1540-1600 PT Time Calculation (min) (ACUTE ONLY): 20 min  Charges:  $Therapeutic Activity: 8-22 mins                    Verdene Lennert, PT, DPT  Acute Rehabilitation [phone] pager #[phone] office     11/26/2019, 4:18 PM

## 2019-11-26 NOTE — Progress Notes (Signed)
   11/26/19 1053  Vitals  Temp (!) 97.4 F (36.3 C)  Temp Source Axillary  BP (!) 86/48  MAP (mmHg) (!) 62  BP Location Left Arm  BP Method Automatic  Patient Position (if appropriate) Lying  ECG Heart Rate 78  Resp 20  Oxygen Therapy  SpO2 100 %  O2 Device Nasal Cannula  O2 Flow Rate (L/min) 2 L/min  MEWS Score  MEWS Temp 0  MEWS Systolic 1  MEWS Pulse 0  MEWS RR 0  MEWS LOC 0  MEWS Score 1  MEWS Score Color Nyoka Cowden

## 2019-11-27 LAB — CBC WITH DIFFERENTIAL/PLATELET
Abs Immature Granulocytes: 0.19 10*3/uL — ABNORMAL HIGH (ref 0.00–0.07)
Basophils Absolute: 0 10*3/uL (ref 0.0–0.1)
Basophils Relative: 0 %
Eosinophils Absolute: 0.1 10*3/uL (ref 0.0–0.5)
Eosinophils Relative: 2 %
HCT: 28.4 % — ABNORMAL LOW (ref 39.0–52.0)
Hemoglobin: 9 g/dL — ABNORMAL LOW (ref 13.0–17.0)
Immature Granulocytes: 2 %
Lymphocytes Relative: 17 %
Lymphs Abs: 1.4 10*3/uL (ref 0.7–4.0)
MCH: 29.7 pg (ref 26.0–34.0)
MCHC: 31.7 g/dL (ref 30.0–36.0)
MCV: 93.7 fL (ref 80.0–100.0)
Monocytes Absolute: 0.9 10*3/uL (ref 0.1–1.0)
Monocytes Relative: 11 %
Neutro Abs: 5.7 10*3/uL (ref 1.7–7.7)
Neutrophils Relative %: 68 %
Platelets: 150 10*3/uL (ref 150–400)
RBC: 3.03 MIL/uL — ABNORMAL LOW (ref 4.22–5.81)
RDW: 15.9 % — ABNORMAL HIGH (ref 11.5–15.5)
WBC: 8.3 10*3/uL (ref 4.0–10.5)
nRBC: 0 % (ref 0.0–0.2)

## 2019-11-27 LAB — GLUCOSE, CAPILLARY
Glucose-Capillary: 107 mg/dL — ABNORMAL HIGH (ref 70–99)
Glucose-Capillary: 131 mg/dL — ABNORMAL HIGH (ref 70–99)
Glucose-Capillary: 160 mg/dL — ABNORMAL HIGH (ref 70–99)
Glucose-Capillary: 112 mg/dL — ABNORMAL HIGH (ref 70–99)

## 2019-11-27 LAB — D-DIMER, QUANTITATIVE: D-Dimer, Quant: 1.97 ug/mL-FEU — ABNORMAL HIGH (ref 0.00–0.50)

## 2019-11-27 LAB — COMPREHENSIVE METABOLIC PANEL
ALT: 26 U/L (ref 0–44)
AST: 20 U/L (ref 15–41)
Albumin: 1.8 g/dL — ABNORMAL LOW (ref 3.5–5.0)
Alkaline Phosphatase: 67 U/L (ref 38–126)
Anion gap: 8 (ref 5–15)
BUN: 33 mg/dL — ABNORMAL HIGH (ref 8–23)
CO2: 20 mmol/L — ABNORMAL LOW (ref 22–32)
Calcium: 7.6 mg/dL — ABNORMAL LOW (ref 8.9–10.3)
Chloride: 112 mmol/L — ABNORMAL HIGH (ref 98–111)
Creatinine, Ser: 2.23 mg/dL — ABNORMAL HIGH (ref 0.61–1.24)
GFR calc Af Amer: 32 mL/min — ABNORMAL LOW (ref >60)
GFR calc non Af Amer: 27 mL/min — ABNORMAL LOW (ref >60)
Glucose, Bld: 92 mg/dL (ref 70–99)
Potassium: 3.6 mmol/L (ref 3.5–5.1)
Sodium: 140 mmol/L (ref 135–145)
Total Bilirubin: 1 mg/dL (ref 0.3–1.2)
Total Protein: 5.3 g/dL — ABNORMAL LOW (ref 6.5–8.1)

## 2019-11-27 LAB — TYPE AND SCREEN
ABO/RH(D): O POS
Antibody Screen: NEGATIVE
Unit division: 0

## 2019-11-27 LAB — CORTISOL: Cortisol, Plasma: 12.4 ug/dL

## 2019-11-27 LAB — BPAM RBC
Blood Product Expiration Date: 202106232359
ISSUE DATE / TIME: 202105241110
Unit Type and Rh: 5100

## 2019-11-27 LAB — C-REACTIVE PROTEIN: CRP: 11.9 mg/dL — ABNORMAL HIGH (ref <1.0)

## 2019-11-27 MED ORDER — POTASSIUM CHLORIDE CRYS ER 20 MEQ PO TBCR
40.0000 meq | EXTENDED_RELEASE_TABLET | Freq: Once | ORAL | Status: AC
  Administered 2019-11-27: 40 meq via ORAL
  Filled 2019-11-27: qty 2

## 2019-11-27 MED ORDER — SODIUM CHLORIDE 0.9 % IV BOLUS
250.0000 mL | Freq: Once | INTRAVENOUS | Status: AC
  Administered 2019-11-27: 250 mL via INTRAVENOUS

## 2019-11-27 MED ORDER — GUAIFENESIN-DM 100-10 MG/5ML PO SYRP
5.0000 mL | ORAL_SOLUTION | ORAL | Status: DC | PRN
  Administered 2019-11-27 – 2019-11-28 (×2): 5 mL via ORAL
  Filled 2019-11-27 (×2): qty 5

## 2019-11-27 NOTE — Progress Notes (Signed)
PROGRESS NOTE                                                                                                                                                                                                             Patient Demographics:    Robert Little, is a 79 y.o. male, DOB - 1940-09-12, IDP:[id number]  Outpatient Primary MD for the patient is [name]    LOS - 15  Admit date - 11/12/2019    Chief Complaint  Patient presents with  . Shortness of Breath       Brief Narrative   Robert Little is a 79 y.o. male with history of his affective disorder, CAD, pulmonary fibrosis on home oxygen usually 2 L anemia hypertension diabetes mellitus recently admitted for hypoglycemia with use of sulfonylureas in the setting of chronic kidney disease was brought to the ER after patient has been having productive cough over the last 3 days feeling weak and tired not eating well.  ER he was diagnosed with acute hypoxic respiratory failure due to COVID-19 pneumonia, dehydration AKI and NSTEMI.  Was complicated by runs of A. fib with RVR, dehydration, hypotension and AKI.  As well anemia requiring PRBC transfusion.   Subjective:   Patient in bed, denies any complaints, he had brown  color BM x2, no evidence of melena any bleed .   Assessment  & Plan :   Sepsis with Acute Hypoxic Resp. Failure due to Acute Covid 19 Viral Pneumonitis during the ongoing 2020 Covid 19 Pandemic  -Patient is with moderate disease,. -He was treated with IV remdesivir. -He was treated with IV steroids as well. -Currently hypoxia back to baseline, he is on 2 L nasal cannula at baseline. -Overall extremely frail, deconditioned, high risk for decompensation. -CRP and D-dimers remains significantly remain significantly elevated, despite patient is more than 10 days, and receiving appropriate treatment, which is concerning, continue to monitor  . -Patient extremely frail and debilitated, not able to comply fully with incentive spirometry, and out of bed to chair.   SpO2: 100 % O2 Flow Rate (L/min): 2 L/min  Recent Labs  Lab 11/21/19 0832 11/21/19 4431 11/22/19 5400 11/22/19 0643 11/22/19 1346 11/23/19 0622 11/24/19 0717 11/25/19 0332 11/26/19 0230 11/27/19 0350  CRP 10.7*   < > 12.0*   < >  --  12.1* 6.4* 10.2* 15.4* 11.9*  DDIMER 2.00*   < > 2.40*   < >  --  1.36* 1.57* 1.05* 1.60* 1.97*  BNP 396.7*  --  618.3*  --  749.8* 502.6*  --   --  259.2*  --   PROCALCITON 0.19  --   --   --   --   --   --   --   --   --    < > = values in this interval not displayed.    Hepatic Function Latest Ref Rng & Units 11/27/2019 11/26/2019 11/25/2019  Total Protein 6.5 - 8.1 g/dL 5.3(L) 5.3(L) 5.8(L)  Albumin 3.5 - 5.0 g/dL 1.8(L) 2.0(L) 2.1(L)  AST 15 - 41 U/L 20 22 25   ALT 0 - 44 U/L 26 25 29   Alk Phosphatase 38 - 126 U/L 67 66 73  Total Bilirubin 0.3 - 1.2 mg/dL 1.0 1.1 1.3(H)    Dehydration with AKI on underlying CKD 4  -Baseline creatinine 2.5, at some point he had worsening renal function, and some evidence of volume depletion and low blood pressure, he required fluid boluses intermittently . -Blood pressure remains soft, continue with IV fluids .  Hypernatremia -This has resolved with D5W,  changed to half-normal saline..  Paroxysmal atrial fibrillation with [place] vas 2 score of at least 3 with RVR and Hypotension  -Heart rate was always difficult to control in the setting of soft blood pressure, and holding beta-blockers, patient was started on midodrine, and IV fluid to assist with blood pressure, now he is on Coreg with better blood pressure control, as we will continue with amiodarone . -Previous MD discussed with cardiologist Dr. Ellyn Hack, extremely high risk candidate for anticoagulation due to fall risk and frail status, aspirin, rate better will be placed on  with oral amiodarone.    History of CAD.  - Not NSTEMI.   Could be due to demand ischemia from hypoxia, continue aspirin, statin and beta-blocker for secondary prevention, stable echocardiogram, stable EKG and chest pain-free seen by cardiology continue present care with outpatient cardiology follow-up post discharge.  History of schizoaffective disorder -  takes monthly injection of Invega and daily on Klonopin and Cogentin.  - Follows at [facility] follow up.  -  According to the POA ports his Robert Little has been stopped by his primary psychiatrist in April of this year, given worsening renal function , so I have discontinued his Cogentin there inpatient psych recommendation . -He is back on his Seroquel (held briefly secondary to sedation couple days ago), and Klonopin dose has been lowered to nightly only.  History of pulmonary fibrosis now on 2 L home oxygen.  - Continue supportive care with a liters as needed.  Outpatient pulmonary follow-up.  Anemia of chronic disease. -Hemoglobin has dropped to 6.9 this morning, no evidence of GI bleed, patient was transfused 1 unit PRBC 5/24 especially with his known history of CAD.  With good response, hemoglobin this morning is 9.  Chronic diastolic CHF.  EF on recent echocardiogram was 60%.  Appears dehydrated, hold Lasix, continue with IV fluids .  Chronically elevated D-dimers.   - Previously has had negative lower extremity venous duplex, he is on subcu heparin for DVT prophylaxis  Essential hypertension/hypotension -Patient with history of hypertension, but main issue during hospital stay was low blood pressure, pressure remains low despite stopping Cardizem and beta-blocker for his A. Fib. -Did respond to IV fluids, and midodrine, blood pressure has improved, will continue  with Midodrin.  Hypokalemia - repleted.   DM type II.  For now sliding scale, addedm low-dose Lantus as CBGs are running high due to steroid use.   Lab Results  Component Value Date   HGBA1C 7.0 (H) 09/20/2019   CBG  (last 3)  Recent Labs    11/26/19 2052 11/27/19 0739 11/27/19 1211  GLUCAP 84 107* 131*     Condition - Extremely Guarded  Family Communication  : Discussed with POA Mr. Robert Little 5/24 and have updated him.   I have explained the tenuous status of this patient is extremely frail, with multiple comorbidities.  Code Status :  DNR  Consults  :  Cards, Psych  Disposition Plan  :    Status is: Inpatient  Remains inpatient appropriate because:Inpatient level of care appropriate due to severity of illness  Dispo: The patient is from: Group home              Anticipated d/c is to: Group home              Anticipated d/c date is: 2 days              Patient currently is not medically stable to d/c.  Is improving, but remains with soft blood pressure still requiring IV fluids.   Procedures  :    TTE - 1. Left ventricular ejection fraction, by estimation, is 60 to 65%. The left ventricle has normal function. The left ventricle has no regional wall motion abnormalities. There is mild concentric left ventricular hypertrophy. Left ventricular diastolic parameters are consistent with Grade II diastolic dysfunction  (pseudonormalization). Elevated left atrial pressure.  2. Right ventricular systolic function is normal. The right ventricular size is normal. There is normal pulmonary artery systolic pressure.  3. Left atrial size was mildly dilated.  4. The mitral valve is normal in structure. Mild mitral valve regurgitation. No evidence of mitral stenosis.  5. The aortic valve is normal in structure. Aortic valve regurgitation is not visualized. No aortic stenosis is present.  6. The inferior vena cava is normal in size with greater than 50% respiratory variability, suggesting right atrial pressure of 3 mmHg.   PUD Prophylaxis :  None  DVT Prophylaxis  :   Heparin   Lab Results  Component Value Date   PLT 150 11/27/2019    Diet :  Diet Order            DIET DYS 2 Room service  appropriate? No; Fluid consistency: Nectar Thick  Diet effective now               Inpatient Medications  Scheduled Meds: . albuterol  2 puff Inhalation Q2200  . amiodarone  200 mg Oral Daily  . aspirin EC  81 mg Oral Daily  . atorvastatin  20 mg Oral QHS  . carvedilol  3.125 mg Oral BID WC  . Chlorhexidine Gluconate Cloth  6 each Topical Daily  . clonazePAM  0.5 mg Oral QHS  . feeding supplement (ENSURE ENLIVE)  237 mL Oral BID BM  . feeding supplement (PRO-STAT SUGAR FREE 64)  30 mL Oral BID  . ferrous sulfate  325 mg Oral BID WC  . fluticasone furoate-vilanterol  1 puff Inhalation Daily  . heparin injection (subcutaneous)  5,000 Units Subcutaneous Q8H  . insulin aspart  0-6 Units Subcutaneous TID WC  . insulin aspart  3 Units Subcutaneous TID WC  . lidocaine  1 application Urethral Once  . midodrine  5 mg Oral TID WC  . pantoprazole  40 mg Oral Daily  . QUEtiapine  25 mg Oral QHS  . tamsulosin  0.4 mg Oral Daily  . umeclidinium bromide  1 puff Inhalation Daily  . vitamin B-12  500 mcg Oral BID   Continuous Infusions: . sodium chloride 50 mL/hr at 11/27/19 0535   PRN Meds:.acetaminophen, ALPRAZolam, guaiFENesin-dextromethorphan, metoprolol tartrate, [DISCONTINUED] ondansetron **OR** ondansetron (ZOFRAN) IV, Resource ThickenUp Clear, zolpidem  Antibiotics  :    Anti-infectives (From admission, onward)   Start     Dose/Rate Route Frequency Ordered Stop   11/13/19 1600  remdesivir 100 mg in sodium chloride 0.9 % 100 mL IVPB     100 mg 200 mL/hr over 30 Minutes Intravenous Daily 11/12/19 2146 11/16/19 1700   11/12/19 2300  remdesivir 200 mg in sodium chloride 0.9% 250 mL IVPB     200 mg 580 mL/hr over 30 Minutes Intravenous Once 11/12/19 2146 11/13/19 0104   11/12/19 2130  cefTRIAXone (ROCEPHIN) 2 g in sodium chloride 0.9 % 100 mL IVPB  Status:  Discontinued     2 g 200 mL/hr over 30 Minutes Intravenous  Once 11/12/19 2056 11/12/19 2125   11/12/19 2130  azithromycin  (ZITHROMAX) 500 mg in sodium chloride 0.9 % 250 mL IVPB  Status:  Discontinued     500 mg 250 mL/hr over 60 Minutes Intravenous  Once 11/12/19 2056 11/12/19 2125        Emeline Gins Philena Obey M.D on 11/27/2019 at 4:53 PM  To page go to [email or web address] - password [email or web address]  See all Orders from today for further details    Objective:   Vitals:   11/27/19 0300 11/27/19 0351 11/27/19 0740 11/27/19 1200  BP:  (!) 119/44 (!) 90/41 (!) 108/52  Pulse: 63 63 65 75  Resp:  (!) 29 (!) 24 (!) 28  Temp: 98.3 F (36.8 C) 98.3 F (36.8 C) 98.2 F (36.8 C) 98.3 F (36.8 C)  TempSrc: Axillary Axillary Axillary Oral  SpO2:  93% 91% 100%  Weight:      Height:        Wt Readings from Last 3 Encounters:  11/13/19 69.4 kg  10/28/19 69.4 kg  10/10/19 69.4 kg     Intake/Output Summary (Last 24 hours) at 11/27/2019 1653 Last data filed at 11/27/2019 1500 Gross per 24 hour  Intake 1460.4 ml  Output 1375 ml  Net 85.4 ml     Physical Exam  Extremely frail, chronically ill-appearing, but he does appear more awake, alert and appropriate today Symmetrical Chest wall movement, Good air movement bilaterally, CTAB RRR,No Gallops,Rubs or new Murmurs, No Parasternal Heave +ve B.Sounds, Abd Soft, No tenderness, No rebound - guarding or rigidity. No Cyanosis, Clubbing or edema, No new Rash or bruise         Data Review:    CBC Recent Labs  Lab 11/23/19 0622 11/23/19 0622 11/24/19 0717 11/25/19 0332 11/26/19 0230 11/26/19 1909 11/27/19 0350  WBC 10.2  --  10.9* 12.5* 11.3*  --  8.3  HGB 9.7*   < > 8.0* 8.0* 6.9* 8.8* 9.0*  HCT 31.7*   < > 26.1* 26.4* 22.5* 27.4* 28.4*  PLT PLATELET CLUMPS NOTED ON SMEAR, UNABLE TO ESTIMATE  --  198 174 185  --  150  MCV 93.5  --  95.3 95.7 94.9  --  93.7  MCH 28.6  --  29.2 29.0 29.1  --  29.7  MCHC 30.6  --  30.7 30.3 30.7  --  31.7  RDW 17.1*  --  17.2* 17.1* 16.7*  --  15.9*  LYMPHSABS 1.2  --  1.2 1.1 1.3  --  1.4  MONOABS 0.8  --  0.8 1.0 1.0  --   0.9  EOSABS 0.2  --  0.1 0.1 0.1  --  0.1  BASOSABS 0.0  --  0.0 0.0 0.0  --  0.0   < > = values in this interval not displayed.    Chemistries  Recent Labs  Lab 11/23/19 0622 11/24/19 0717 11/25/19 0332 11/26/19 0230 11/27/19 0350  NA 149* 146* 146* 141 140  K 3.9 3.8 4.1 3.9 3.6  CL 115* 116* 115* 110 112*  CO2 23 22 21* 20* 20*  GLUCOSE 174* 206* 136* 133* 92  BUN 36* 41* 35* 34* 33*  CREATININE 2.34* 2.65* 2.43* 2.29* 2.23*  CALCIUM 8.7* 8.0* 7.8* 7.8* 7.6*  AST 25 21 25 22 20   ALT 30 25 29 25 26   ALKPHOS 80 71 73 66 67  BILITOT 1.4* 0.7 1.3* 1.1 1.0     ------------------------------------------------------------------------------------------------------------------ No results for input(s): CHOL, HDL, LDLCALC, TRIG, CHOLHDL, LDLDIRECT in the last 72 hours.  Lab Results  Component Value Date   HGBA1C 7.0 (H) 09/20/2019   ------------------------------------------------------------------------------------------------------------------ No results for input(s): TSH, T4TOTAL, T3FREE, THYROIDAB in the last 72 hours.  Invalid input(s): FREET3  Cardiac Enzymes No results for input(s): CKMB, TROPONINI, MYOGLOBIN in the last 168 hours.  Invalid input(s): CK ------------------------------------------------------------------------------------------------------------------    Component Value Date/Time   BNP 259.2 (H) 11/26/2019 0230    Micro Results No results found for this or any previous visit (from the past 240 hour(s)).  Radiology Reports CT Head Wo Contrast  Result Date: 10/28/2019 CLINICAL DATA:  Un witnessed fall, weakness, confusion EXAM: CT HEAD WITHOUT CONTRAST TECHNIQUE: Contiguous axial images were obtained from the base of the skull through the vertex without intravenous contrast. COMPARISON:  None. FINDINGS: Brain: Hypodensities in the periventricular white matter, most pronounced in the left frontal region, most likely reflect chronic small vessel  ischemic change. No signs of acute infarct or hemorrhage. Lateral ventricles and remaining midline structures are unremarkable. No acute extra-axial fluid collections. No mass effect. Vascular: No hyperdense vessel or unexpected calcification. Skull: Normal. Negative for fracture or focal lesion. Sinuses/Orbits: Mucoperiosteal thickening within the maxillary sinuses, right greater than left. Remaining sinuses are clear. Other: None. IMPRESSION: 1. No acute intracranial trauma. 2. Likely chronic small vessel ischemic changes within the frontal periventricular white matter. Electronically Signed   By: Randa Ngo M.D.   On: 10/28/2019 19:31   DG Chest Port 1 View  Result Date: 11/20/2019 CLINICAL DATA:  Shortness of breath, cough. EXAM: PORTABLE CHEST 1 VIEW COMPARISON:  Nov 19, 2019. FINDINGS: Stable cardiomediastinal silhouette. Sternotomy wires are noted. No pneumothorax or pleural effusion is noted. Stable fibrotic changes noted in the lung bases. No definite acute abnormality is noted. Bony thorax is unremarkable. IMPRESSION: Stable bibasilar fibrotic changes are noted. No definite acute cardiopulmonary abnormality seen. Electronically Signed   By: Marijo Conception M.D.   On: 11/20/2019 08:32   DG Chest Port 1 View  Result Date: 11/19/2019 CLINICAL DATA:  Shortness of breath EXAM: PORTABLE CHEST 1 VIEW COMPARISON:  Nov 12, 2019 chest radiograph and chest CT September 19, 2019 FINDINGS: There is fibrotic change scattered throughout the lungs, most notably in the mid and lower lung regions, essentially stable. No new opacity evident. Heart is upper normal in size with pulmonary vascularity  normal. Patient is status post coronary artery bypass grafting. No adenopathy appreciable. There is aortic atherosclerosis. No evident bone lesions. IMPRESSION: Stable fibrotic type change, most severe in the mid and lower lung regions. No new opacity evident. Stable cardiac silhouette. Postoperative changes noted. Aortic  Atherosclerosis (ICD10-I70.0). Electronically Signed   By: Lowella Grip III M.D.   On: 11/19/2019 08:24   DG Chest Port 1 View  Result Date: 11/12/2019 CLINICAL DATA:  Shortness of breath and sepsis. Fever. Productive cough. EXAM: PORTABLE CHEST 1 VIEW COMPARISON:  10/28/2019.  09/19/2019. FINDINGS: The patient has a history of pulmonary fibrosis and emphysema. Chronic lung markings appear similar allowing for technical differences. Upper lobe emphysema as seen previously. No sign of dense consolidation, collapse or effusion. Previous median sternotomy and CABG. Aortic atherosclerosis. No acute bone finding. IMPRESSION: Portable film with the poor inspiration. Chronic fibrotic lung disease and upper lung emphysema. No sign of acute infiltrate, collapse or effusion. Electronically Signed   By: Nelson Chimes M.D.   On: 11/12/2019 19:37   DG Chest Portable 1 View  Result Date: 10/28/2019 CLINICAL DATA:  79 year old male with altered mental status. EXAM: PORTABLE CHEST 1 VIEW COMPARISON:  Chest radiograph dated 09/19/2019 and CT dated 09/19/2019 FINDINGS: Diffuse interstitial scarring in keeping with fibrosis. No new consolidation. There is no pleural effusion or pneumothorax. Stable cardiomediastinal silhouette. Atherosclerotic calcification of the aorta. Median sternotomy wires and CABG vascular clips. IMPRESSION: 1. No acute cardiopulmonary process. 2. Fibrotic changes. Electronically Signed   By: Anner Crete M.D.   On: 10/28/2019 21:16   ECHOCARDIOGRAM COMPLETE  Result Date: 11/14/2019    ECHOCARDIOGRAM REPORT   Patient Name:   RICHAD RAMSAY Date of Exam: 11/14/2019 Medical Rec #:  [id number]    Height:       68.0 in Accession #:    [id number]   Weight:       153.0 lb Date of Birth:  1941-02-15    BSA:          1.824 m Patient Age:    67 years     BP:           131/61 mmHg Patient Gender: M            HR:           58 bpm. Exam Location:  Inpatient Procedure: 2D Echo, Cardiac Doppler and Color  Doppler Indications:    Elevated Troponin  History:        Patient has prior history of Echocardiogram examinations, most                 recent 09/06/2019. CHF, CAD; Risk Factors:Hypertension,                 Dyslipidemia and Former Smoker.  Sonographer:    Vickie Epley RDCS Referring Phys: [id number] RHONDA G BARRETT  Sonographer Comments: Covid positive. IMPRESSIONS  1. Left ventricular ejection fraction, by estimation, is 60 to 65%. The left ventricle has normal function. The left ventricle has no regional wall motion abnormalities. There is mild concentric left ventricular hypertrophy. Left ventricular diastolic parameters are consistent with Grade II diastolic dysfunction (pseudonormalization). Elevated left atrial pressure.  2. Right ventricular systolic function is normal. The right ventricular size is normal. There is normal pulmonary artery systolic pressure.  3. Left atrial size was mildly dilated.  4. The mitral valve is normal in structure. Mild mitral valve regurgitation. No evidence of mitral stenosis.  5. The aortic valve is normal  in structure. Aortic valve regurgitation is not visualized. No aortic stenosis is present.  6. The inferior vena cava is normal in size with greater than 50% respiratory variability, suggesting right atrial pressure of 3 mmHg. FINDINGS  Left Ventricle: Left ventricular ejection fraction, by estimation, is 60 to 65%. The left ventricle has normal function. The left ventricle has no regional wall motion abnormalities. The left ventricular internal cavity size was normal in size. There is  mild concentric left ventricular hypertrophy. Left ventricular diastolic parameters are consistent with Grade II diastolic dysfunction (pseudonormalization). Elevated left atrial pressure. Right Ventricle: The right ventricular size is normal. No increase in right ventricular wall thickness. Right ventricular systolic function is normal. There is normal pulmonary artery systolic pressure. The  tricuspid regurgitant velocity is 2.25 m/s, and  with an assumed right atrial pressure of 3 mmHg, the estimated right ventricular systolic pressure is 35.0 mmHg. Left Atrium: Left atrial size was mildly dilated. Right Atrium: Right atrial size was normal in size. Pericardium: There is no evidence of pericardial effusion. Mitral Valve: The mitral valve is normal in structure. Normal mobility of the mitral valve leaflets. Mild mitral valve regurgitation. No evidence of mitral valve stenosis. Tricuspid Valve: The tricuspid valve is normal in structure. Tricuspid valve regurgitation is not demonstrated. No evidence of tricuspid stenosis. Aortic Valve: The aortic valve is normal in structure. Aortic valve regurgitation is not visualized. No aortic stenosis is present. Pulmonic Valve: The pulmonic valve was normal in structure. Pulmonic valve regurgitation is not visualized. No evidence of pulmonic stenosis. Aorta: The aortic root is normal in size and structure. Venous: The inferior vena cava is normal in size with greater than 50% respiratory variability, suggesting right atrial pressure of 3 mmHg. IAS/Shunts: No atrial level shunt detected by color flow Doppler.  LEFT VENTRICLE PLAX 2D LVIDd:         4.70 cm      Diastology LVIDs:         3.42 cm      LV e' lateral:   7.14 cm/s LV PW:         0.80 cm      LV E/e' lateral: 12.3 LV IVS:        0.80 cm      LV e' medial:    4.62 cm/s LVOT diam:     1.90 cm      LV E/e' medial:  19.0 LV SV:         45 LV SV Index:   25 LVOT Area:     2.84 cm  LV Volumes (MOD) LV vol d, MOD A2C: 103.0 ml LV vol d, MOD A4C: 115.0 ml LV vol s, MOD A2C: 61.9 ml LV vol s, MOD A4C: 46.6 ml LV SV MOD A2C:     41.1 ml LV SV MOD A4C:     115.0 ml LV SV MOD BP:      57.4 ml RIGHT VENTRICLE RV S prime:     10.10 cm/s TAPSE (M-mode): 1.6 cm LEFT ATRIUM             Index       RIGHT ATRIUM           Index LA diam:        4.20 cm 2.30 cm/m  RA Area:     10.30 cm LA Vol (A2C):   24.4 ml 13.38 ml/m RA  Volume:   18.10 ml  9.92 ml/m LA Vol (A4C):   33.3  ml 18.26 ml/m LA Biplane Vol: 31.4 ml 17.22 ml/m  AORTIC VALVE LVOT Vmax:   70.00 cm/s LVOT Vmean:  40.000 cm/s LVOT VTI:    0.160 m  AORTA Ao Root diam: 3.10 cm MITRAL VALVE               TRICUSPID VALVE MV Area (PHT): 5.02 cm    TR Peak grad:   20.2 mmHg MV Decel Time: 151 msec    TR Vmax:        225.00 cm/s MV E velocity: 87.80 cm/s MV A velocity: 70.30 cm/s  SHUNTS MV E/A ratio:  1.25        Systemic VTI:  0.16 m                            Systemic Diam: 1.90 cm Ena Dawley MD Electronically signed by Ena Dawley MD Signature Date/Time: 11/14/2019/1:13:59 PM    Final

## 2019-11-28 ENCOUNTER — Inpatient Hospital Stay (HOSPITAL_COMMUNITY): Payer: Medicare Other

## 2019-11-28 LAB — CBC
HCT: 26 % — ABNORMAL LOW (ref 39.0–52.0)
Hemoglobin: 8.4 g/dL — ABNORMAL LOW (ref 13.0–17.0)
MCH: 30.1 pg (ref 26.0–34.0)
MCHC: 32.3 g/dL (ref 30.0–36.0)
MCV: 93.2 fL (ref 80.0–100.0)
Platelets: 166 10*3/uL (ref 150–400)
RBC: 2.79 MIL/uL — ABNORMAL LOW (ref 4.22–5.81)
RDW: 15.5 % (ref 11.5–15.5)
WBC: 5.9 10*3/uL (ref 4.0–10.5)
nRBC: 0 % (ref 0.0–0.2)

## 2019-11-28 LAB — GLUCOSE, CAPILLARY
Glucose-Capillary: 91 mg/dL (ref 70–99)
Glucose-Capillary: 163 mg/dL — ABNORMAL HIGH (ref 70–99)
Glucose-Capillary: 128 mg/dL — ABNORMAL HIGH (ref 70–99)
Glucose-Capillary: 133 mg/dL — ABNORMAL HIGH (ref 70–99)

## 2019-11-28 LAB — BASIC METABOLIC PANEL
Anion gap: 11 (ref 5–15)
BUN: 30 mg/dL — ABNORMAL HIGH (ref 8–23)
CO2: 19 mmol/L — ABNORMAL LOW (ref 22–32)
Calcium: 7.7 mg/dL — ABNORMAL LOW (ref 8.9–10.3)
Chloride: 107 mmol/L (ref 98–111)
Creatinine, Ser: 2.01 mg/dL — ABNORMAL HIGH (ref 0.61–1.24)
GFR calc Af Amer: 36 mL/min — ABNORMAL LOW (ref >60)
GFR calc non Af Amer: 31 mL/min — ABNORMAL LOW (ref >60)
Glucose, Bld: 180 mg/dL — ABNORMAL HIGH (ref 70–99)
Potassium: 3.8 mmol/L (ref 3.5–5.1)
Sodium: 137 mmol/L (ref 135–145)

## 2019-11-28 MED ORDER — LACTATED RINGERS IV BOLUS
500.0000 mL | Freq: Once | INTRAVENOUS | Status: AC
  Administered 2019-11-28: 500 mL via INTRAVENOUS

## 2019-11-28 NOTE — Progress Notes (Signed)
PROGRESS NOTE                                                                                                                                                                                                             Patient Demographics:    Robert Little, is a 79 y.o. male, DOB - 03/05/1941, SKA:[id number]  Outpatient Primary MD for the patient is [name]    LOS - 16  Admit date - 11/12/2019    Chief Complaint  Patient presents with  . Shortness of Breath       Brief Narrative   Robert Little is a 79 y.o. male with history of his affective disorder, CAD, pulmonary fibrosis on home oxygen usually 2 L anemia hypertension diabetes mellitus recently admitted for hypoglycemia with use of sulfonylureas in the setting of chronic kidney disease was brought to the ER after patient has been having productive cough over the last 3 days feeling weak and tired not eating well.  ER he was diagnosed with acute hypoxic respiratory failure due to COVID-19 pneumonia, dehydration AKI and NSTEMI.  Was complicated by runs of A. fib with RVR, dehydration, hypotension and AKI.  As well anemia requiring PRBC transfusion.   Subjective:   Patient in bed, appears comfortable, denies any headache, no fever, no chest pain or pressure, no shortness of breath , no abdominal pain. No focal weakness.   Assessment  & Plan :   Sepsis with Acute Hypoxic Resp. Failure due to Acute Covid 19 Viral Pneumonitis during the ongoing 2020 Covid 19 Pandemic  -Patient is with moderate disease,. -He was treated with IV remdesivir. -He was treated with IV steroids as well. -Currently hypoxia back to baseline, he is on 2 L nasal cannula at baseline. -Overall extremely frail, deconditioned, high risk for decompensation. -CRP and D-dimers remains significantly remain significantly elevated, despite patient is more than 10 days, and receiving  appropriate treatment, which is concerning, continue to monitor . -Patient extremely frail and debilitated, not able to comply fully with incentive spirometry, and out of bed to chair.   SpO2: 95 % O2 Flow Rate (L/min): 2 L/min  Recent Labs  Lab 11/22/19 0643 11/22/19 0643 11/22/19 1346 11/23/19 0622 11/24/19 0717 11/25/19 0332 11/26/19 0230 11/27/19 0350  CRP 12.0*   < >  --  12.1* 6.4*  10.2* 15.4* 11.9*  DDIMER 2.40*   < >  --  1.36* 1.57* 1.05* 1.60* 1.97*  BNP 618.3*  --  749.8* 502.6*  --   --  259.2*  --    < > = values in this interval not displayed.    Hepatic Function Latest Ref Rng & Units 11/27/2019 11/26/2019 11/25/2019  Total Protein 6.5 - 8.1 g/dL 5.3(L) 5.3(L) 5.8(L)  Albumin 3.5 - 5.0 g/dL 1.8(L) 2.0(L) 2.1(L)  AST 15 - 41 U/L _0 ALT 0 - 44 U/L _1 Alk Phosphatase 38 - 126 U/L 67 66 73  Total Bilirubin 0.3 - 1.2 mg/dL 1.0 1.1 1.3(H)    Dehydration with AKI on underlying CKD 4 - -Baseline creatinine 2.5, at some point he had worsening renal function, and some evidence of volume depletion and low blood pressure, AKI has resolved creatinine close to baseline of 2.5.    Hypernatremia - resolved after IV fluids and hydration.  Paroxysmal atrial fibrillation with [place] vas 2 score of at least 3 with RVR and Hypotension - Heart rate was always difficult to control in the setting of soft blood pressure, and holding beta-blockers, patient was started on midodrine, and IV fluid to assist with blood pressure, continue amiodarone for now and hold beta-blockers due to hypotension.  Case was discussed with Dr. Ellyn Hack he is an extremely high risk candidate for anticoagulation continue aspirin.     History of CAD. - Not NSTEMI.  Could be due to demand ischemia from hypoxia, continue aspirin, statin and beta-blocker (if blood pressure can tolerate )for secondary prevention, stable echocardiogram, stable EKG and chest pain-free seen by cardiology continue present care  with outpatient cardiology follow-up post discharge.  History of schizoaffective disorder -   takes monthly injection of Invega and daily on Klonopin and Cogentin.  Follows at [facility] follow up.  According to the POA ports his [name] has been stopped by his primary psychiatrist in April of this year, given worsening renal function , so I have discontinued his Cogentin per inpatient psych recommendation. He is back on his Seroquel (held briefly secondary to sedation couple days ago), and Klonopin dose has been lowered to nightly only.  History of pulmonary fibrosis now on 2 L home oxygen. - Continue supportive care with a liters as needed.  Outpatient pulmonary follow-up.  Anemia of chronic disease. Hemoglobin has dropped to 6.9 this morning, no evidence of GI bleed, patient was transfused 1 unit PRBC 5/24 especially with his known history of CAD.  With good response, hemoglobin this morning is 9.  Chronic diastolic CHF.  EF on recent echocardiogram was 60%.  Appears dehydrated, hold Lasix, continue with IV fluids .  Chronically elevated D-dimers.  - Previously has had negative lower extremity venous duplex, he is on subcu heparin for DVT prophylaxis  Essential hypertension/hypotension  -IV fluid bolus on 11/28/2019, continue midodrine and hold beta-blockers.  Hypokalemia - repleted.  DM type II.  For now sliding scale, addedm low-dose Lantus as CBGs are running high due to steroid use.   Lab Results  Component Value Date   HGBA1C 7.0 (H) 09/20/2019   CBG (last 3)  Recent Labs    11/27/19 2010 11/28/19 0730 11/28/19 1118  GLUCAP 112* 91 163*     Condition - Extremely Guarded  Family Communication  : Discussed with POA Mr. Robert Little 5/24 and have updated him.   I have explained the tenuous status of this patient is extremely  frail, with multiple comorbidities.  Code Status :  DNR  Consults  :  Cards, Psych  Disposition Plan  :    Status is: Inpatient  Remains  inpatient appropriate because:Inpatient level of care appropriate due to severity of illness  Dispo: The patient is from: Group home              Anticipated d/c is to: Group home              Anticipated d/c date is: 2 days              Patient currently is not medically stable to d/c.  Is improving, but remains with soft blood pressure still requiring IV fluids.   Procedures  :    TTE - 1. Left ventricular ejection fraction, by estimation, is 60 to 65%. The left ventricle has normal function. The left ventricle has no regional wall motion abnormalities. There is mild concentric left ventricular hypertrophy. Left ventricular diastolic parameters are consistent with Grade II diastolic dysfunction  (pseudonormalization). Elevated left atrial pressure.  2. Right ventricular systolic function is normal. The right ventricular size is normal. There is normal pulmonary artery systolic pressure.  3. Left atrial size was mildly dilated.  4. The mitral valve is normal in structure. Mild mitral valve regurgitation. No evidence of mitral stenosis.  5. The aortic valve is normal in structure. Aortic valve regurgitation is not visualized. No aortic stenosis is present.  6. The inferior vena cava is normal in size with greater than 50% respiratory variability, suggesting right atrial pressure of 3 mmHg.   PUD Prophylaxis :  None  DVT Prophylaxis  :   Heparin   Lab Results  Component Value Date   PLT 166 11/28/2019    Diet :  Diet Order            DIET DYS 2 Room service appropriate? No; Fluid consistency: Nectar Thick  Diet effective now               Inpatient Medications  Scheduled Meds: . albuterol  2 puff Inhalation Q2200  . amiodarone  200 mg Oral Daily  . aspirin EC  81 mg Oral Daily  . atorvastatin  20 mg Oral QHS  . carvedilol  3.125 mg Oral BID WC  . Chlorhexidine Gluconate Cloth  6 each Topical Daily  . clonazePAM  0.5 mg Oral QHS  . feeding supplement (ENSURE ENLIVE)   237 mL Oral BID BM  . feeding supplement (PRO-STAT SUGAR FREE 64)  30 mL Oral BID  . ferrous sulfate  325 mg Oral BID WC  . fluticasone furoate-vilanterol  1 puff Inhalation Daily  . heparin injection (subcutaneous)  5,000 Units Subcutaneous Q8H  . insulin aspart  0-6 Units Subcutaneous TID WC  . insulin aspart  3 Units Subcutaneous TID WC  . lidocaine  1 application Urethral Once  . midodrine  5 mg Oral TID WC  . pantoprazole  40 mg Oral Daily  . QUEtiapine  25 mg Oral QHS  . tamsulosin  0.4 mg Oral Daily  . umeclidinium bromide  1 puff Inhalation Daily  . vitamin B-12  500 mcg Oral BID   Continuous Infusions: . sodium chloride 50 mL/hr at 11/27/19 2353   PRN Meds:.acetaminophen, ALPRAZolam, guaiFENesin-dextromethorphan, metoprolol tartrate, [DISCONTINUED] ondansetron **OR** ondansetron (ZOFRAN) IV, Resource ThickenUp Clear, zolpidem  Antibiotics  :    Anti-infectives (From admission, onward)   Start     Dose/Rate Route Frequency Ordered Stop  11/13/19 1600  remdesivir 100 mg in sodium chloride 0.9 % 100 mL IVPB     100 mg 200 mL/hr over 30 Minutes Intravenous Daily 11/12/19 2146 11/16/19 1700   11/12/19 2300  remdesivir 200 mg in sodium chloride 0.9% 250 mL IVPB     200 mg 580 mL/hr over 30 Minutes Intravenous Once 11/12/19 2146 11/13/19 0104   11/12/19 2130  cefTRIAXone (ROCEPHIN) 2 g in sodium chloride 0.9 % 100 mL IVPB  Status:  Discontinued     2 g 200 mL/hr over 30 Minutes Intravenous  Once 11/12/19 2056 11/12/19 2125   11/12/19 2130  azithromycin (ZITHROMAX) 500 mg in sodium chloride 0.9 % 250 mL IVPB  Status:  Discontinued     500 mg 250 mL/hr over 60 Minutes Intravenous  Once 11/12/19 2056 11/12/19 2125        Lala Lund M.D on 11/28/2019 at 1:14 PM  To page go to [email or web address] - password [email or web address]  See all Orders from today for further details    Objective:   Vitals:   11/28/19 0500 11/28/19 0732 11/28/19 0800 11/28/19 1118  BP:  (!) 123/52 (!) 124/54  (!) 100/44  Pulse:  63 72 69  Resp: _0 Temp:  98.1 F (36.7 C) 98.1 F (36.7 C) 98.3 F (36.8 C)  TempSrc:   Oral Oral  SpO2:  99% 99% 95%  Weight:      Height:        Wt Readings from Last 3 Encounters:  11/13/19 69.4 kg  10/28/19 69.4 kg  10/10/19 69.4 kg     Intake/Output Summary (Last 24 hours) at 11/28/2019 1314 Last data filed at 11/28/2019 0910 Gross per 24 hour  Intake 1634.87 ml  Output 850 ml  Net 784.87 ml     Physical Exam  Extremely frail elderly white male lying in hospital bed in no apparent distress, no apparent focal neurological deficits, [place].AT,PERRAL Supple Neck,No JVD, No cervical lymphadenopathy appriciated.  Symmetrical Chest wall movement, Good air movement bilaterally, CTAB RRR,No Gallops, Rubs or new Murmurs, No Parasternal Heave +ve B.Sounds, Abd Soft, No tenderness, No organomegaly appriciated, No rebound - guarding or rigidity. No Cyanosis, Clubbing or edema, No new Rash or bruise    Data Review:    CBC Recent Labs  Lab 11/23/19 0622 11/23/19 0622 11/24/19 0717 11/24/19 0717 11/25/19 0332 11/26/19 0230 11/26/19 1909 11/27/19 0350 11/28/19 0339  WBC 10.2   < > 10.9*  --  12.5* 11.3*  --  8.3 5.9  HGB 9.7*   < > 8.0*   < > 8.0* 6.9* 8.8* 9.0* 8.4*  HCT 31.7*   < > 26.1*   < > 26.4* 22.5* 27.4* 28.4* 26.0*  PLT PLATELET CLUMPS NOTED ON SMEAR, UNABLE TO ESTIMATE   < > 198  --  174 185  --  150 166  MCV 93.5   < > 95.3  --  95.7 94.9  --  93.7 93.2  MCH 28.6   < > 29.2  --  29.0 29.1  --  29.7 30.1  MCHC 30.6   < > 30.7  --  30.3 30.7  --  31.7 32.3  RDW 17.1*   < > 17.2*  --  17.1* 16.7*  --  15.9* 15.5  LYMPHSABS 1.2  --  1.2  --  1.1 1.3  --  1.4  --   MONOABS 0.8  --  0.8  --  1.0 1.0  --  0.9  --  EOSABS 0.2  --  0.1  --  0.1 0.1  --  0.1  --   BASOSABS 0.0  --  0.0  --  0.0 0.0  --  0.0  --    < > = values in this interval not displayed.    Chemistries  Recent Labs  Lab 11/23/19 0622 11/23/19 0622  11/24/19 0717 11/25/19 0332 11/26/19 0230 11/27/19 0350 11/28/19 0339  NA 149*   < > 146* 146* 141 140 137  K 3.9   < > 3.8 4.1 3.9 3.6 3.8  CL 115*   < > 116* 115* 110 112* 107  CO2 23   < > 22 21* 20* 20* 19*  GLUCOSE 174*   < > 206* 136* 133* 92 180*  BUN 36*   < > 41* 35* 34* 33* 30*  CREATININE 2.34*   < > 2.65* 2.43* 2.29* 2.23* 2.01*  CALCIUM 8.7*   < > 8.0* 7.8* 7.8* 7.6* 7.7*  AST 25  --  _0 --   ALT 30  --  _1 --   ALKPHOS 80  --  71 73 66 67  --   BILITOT 1.4*  --  0.7 1.3* 1.1 1.0  --    < > = values in this interval not displayed.     ------------------------------------------------------------------------------------------------------------------ No results for input(s): CHOL, HDL, LDLCALC, TRIG, CHOLHDL, LDLDIRECT in the last 72 hours.  Lab Results  Component Value Date   HGBA1C 7.0 (H) 09/20/2019   ------------------------------------------------------------------------------------------------------------------ No results for input(s): TSH, T4TOTAL, T3FREE, THYROIDAB in the last 72 hours.  Invalid input(s): FREET3  Cardiac Enzymes No results for input(s): CKMB, TROPONINI, MYOGLOBIN in the last 168 hours.  Invalid input(s): CK ------------------------------------------------------------------------------------------------------------------    Component Value Date/Time   BNP 259.2 (H) 11/26/2019 0230    Micro Results No results found for this or any previous visit (from the past 240 hour(s)).  Radiology Reports DG Chest Port 1 View  Result Date: 11/20/2019 CLINICAL DATA:  Shortness of breath, cough. EXAM: PORTABLE CHEST 1 VIEW COMPARISON:  Nov 19, 2019. FINDINGS: Stable cardiomediastinal silhouette. Sternotomy wires are noted. No pneumothorax or pleural effusion is noted. Stable fibrotic changes noted in the lung bases. No definite acute abnormality is noted. Bony thorax is unremarkable. IMPRESSION: Stable bibasilar fibrotic changes  are noted. No definite acute cardiopulmonary abnormality seen. Electronically Signed   By: Marijo Conception M.D.   On: 11/20/2019 08:32   DG Chest Port 1 View  Result Date: 11/19/2019 CLINICAL DATA:  Shortness of breath EXAM: PORTABLE CHEST 1 VIEW COMPARISON:  Nov 12, 2019 chest radiograph and chest CT September 19, 2019 FINDINGS: There is fibrotic change scattered throughout the lungs, most notably in the mid and lower lung regions, essentially stable. No new opacity evident. Heart is upper normal in size with pulmonary vascularity normal. Patient is status post coronary artery bypass grafting. No adenopathy appreciable. There is aortic atherosclerosis. No evident bone lesions. IMPRESSION: Stable fibrotic type change, most severe in the mid and lower lung regions. No new opacity evident. Stable cardiac silhouette. Postoperative changes noted. Aortic Atherosclerosis (ICD10-I70.0). Electronically Signed   By: Lowella Grip III M.D.   On: 11/19/2019 08:24   DG Chest Port 1 View  Result Date: 11/12/2019 CLINICAL DATA:  Shortness of breath and sepsis. Fever. Productive cough. EXAM: PORTABLE CHEST 1 VIEW COMPARISON:  10/28/2019.  09/19/2019. FINDINGS: The patient has a history of pulmonary fibrosis and emphysema.  Chronic lung markings appear similar allowing for technical differences. Upper lobe emphysema as seen previously. No sign of dense consolidation, collapse or effusion. Previous median sternotomy and CABG. Aortic atherosclerosis. No acute bone finding. IMPRESSION: Portable film with the poor inspiration. Chronic fibrotic lung disease and upper lung emphysema. No sign of acute infiltrate, collapse or effusion. Electronically Signed   By: Nelson Chimes M.D.   On: 11/12/2019 19:37   ECHOCARDIOGRAM COMPLETE  Result Date: 11/14/2019    ECHOCARDIOGRAM REPORT   Patient Name:   TREVELLE MCGURN Date of Exam: 11/14/2019 Medical Rec #:  [id number]    Height:       68.0 in Accession #:    [id number]   Weight:        153.0 lb Date of Birth:  January 25, 1941    BSA:          1.824 m Patient Age:    24 years     BP:           131/61 mmHg Patient Gender: M            HR:           58 bpm. Exam Location:  Inpatient Procedure: 2D Echo, Cardiac Doppler and Color Doppler Indications:    Elevated Troponin  History:        Patient has prior history of Echocardiogram examinations, most                 recent 09/06/2019. CHF, CAD; Risk Factors:Hypertension,                 Dyslipidemia and Former Smoker.  Sonographer:    Vickie Epley RDCS Referring Phys: [id number] RHONDA G BARRETT  Sonographer Comments: Covid positive. IMPRESSIONS  1. Left ventricular ejection fraction, by estimation, is 60 to 65%. The left ventricle has normal function. The left ventricle has no regional wall motion abnormalities. There is mild concentric left ventricular hypertrophy. Left ventricular diastolic parameters are consistent with Grade II diastolic dysfunction (pseudonormalization). Elevated left atrial pressure.  2. Right ventricular systolic function is normal. The right ventricular size is normal. There is normal pulmonary artery systolic pressure.  3. Left atrial size was mildly dilated.  4. The mitral valve is normal in structure. Mild mitral valve regurgitation. No evidence of mitral stenosis.  5. The aortic valve is normal in structure. Aortic valve regurgitation is not visualized. No aortic stenosis is present.  6. The inferior vena cava is normal in size with greater than 50% respiratory variability, suggesting right atrial pressure of 3 mmHg. FINDINGS  Left Ventricle: Left ventricular ejection fraction, by estimation, is 60 to 65%. The left ventricle has normal function. The left ventricle has no regional wall motion abnormalities. The left ventricular internal cavity size was normal in size. There is  mild concentric left ventricular hypertrophy. Left ventricular diastolic parameters are consistent with Grade II diastolic dysfunction (pseudonormalization).  Elevated left atrial pressure. Right Ventricle: The right ventricular size is normal. No increase in right ventricular wall thickness. Right ventricular systolic function is normal. There is normal pulmonary artery systolic pressure. The tricuspid regurgitant velocity is 2.25 m/s, and  with an assumed right atrial pressure of 3 mmHg, the estimated right ventricular systolic pressure is 00.4 mmHg. Left Atrium: Left atrial size was mildly dilated. Right Atrium: Right atrial size was normal in size. Pericardium: There is no evidence of pericardial effusion. Mitral Valve: The mitral valve is normal in structure. Normal mobility of the mitral valve leaflets. Mild  mitral valve regurgitation. No evidence of mitral valve stenosis. Tricuspid Valve: The tricuspid valve is normal in structure. Tricuspid valve regurgitation is not demonstrated. No evidence of tricuspid stenosis. Aortic Valve: The aortic valve is normal in structure. Aortic valve regurgitation is not visualized. No aortic stenosis is present. Pulmonic Valve: The pulmonic valve was normal in structure. Pulmonic valve regurgitation is not visualized. No evidence of pulmonic stenosis. Aorta: The aortic root is normal in size and structure. Venous: The inferior vena cava is normal in size with greater than 50% respiratory variability, suggesting right atrial pressure of 3 mmHg. IAS/Shunts: No atrial level shunt detected by color flow Doppler.  LEFT VENTRICLE PLAX 2D LVIDd:         4.70 cm      Diastology LVIDs:         3.42 cm      LV e' lateral:   7.14 cm/s LV PW:         0.80 cm      LV E/e' lateral: 12.3 LV IVS:        0.80 cm      LV e' medial:    4.62 cm/s LVOT diam:     1.90 cm      LV E/e' medial:  19.0 LV SV:         45 LV SV Index:   25 LVOT Area:     2.84 cm  LV Volumes (MOD) LV vol d, MOD A2C: 103.0 ml LV vol d, MOD A4C: 115.0 ml LV vol s, MOD A2C: 61.9 ml LV vol s, MOD A4C: 46.6 ml LV SV MOD A2C:     41.1 ml LV SV MOD A4C:     115.0 ml LV SV MOD BP:       57.4 ml RIGHT VENTRICLE RV S prime:     10.10 cm/s TAPSE (M-mode): 1.6 cm LEFT ATRIUM             Index       RIGHT ATRIUM           Index LA diam:        4.20 cm 2.30 cm/m  RA Area:     10.30 cm LA Vol (A2C):   24.4 ml 13.38 ml/m RA Volume:   18.10 ml  9.92 ml/m LA Vol (A4C):   33.3 ml 18.26 ml/m LA Biplane Vol: 31.4 ml 17.22 ml/m  AORTIC VALVE LVOT Vmax:   70.00 cm/s LVOT Vmean:  40.000 cm/s LVOT VTI:    0.160 m  AORTA Ao Root diam: 3.10 cm MITRAL VALVE               TRICUSPID VALVE MV Area (PHT): 5.02 cm    TR Peak grad:   20.2 mmHg MV Decel Time: 151 msec    TR Vmax:        225.00 cm/s MV E velocity: 87.80 cm/s MV A velocity: 70.30 cm/s  SHUNTS MV E/A ratio:  1.25        Systemic VTI:  0.16 m                            Systemic Diam: 1.90 cm Ena Dawley MD Electronically signed by Ena Dawley MD Signature Date/Time: 11/14/2019/1:13:59 PM    Final

## 2019-11-28 NOTE — Progress Notes (Signed)
Objective Swallowing Evaluation: Type of Study: MBS-Modified Barium Swallow Study   Patient Details  Name: Robert Little MRN: [id number] Date of Birth: 04-23-1941  Today's Date: 11/28/2019 Time: SLP Start Time (ACUTE ONLY): 1415 -SLP Stop Time (ACUTE ONLY): 1440  SLP Time Calculation (min) (ACUTE ONLY): 25 min   Past Medical History:  Past Medical History:  Diagnosis Date  . Anemia   . Coronary artery disease   . GERD (gastroesophageal reflux disease)   . Hypertension   . Pulmonary fibrosis ([place])   . Schizo affective schizophrenia [facility])    Past Surgical History:  Past Surgical History:  Procedure Laterality Date  . CORONARY ARTERY BYPASS GRAFT     HPI: Robert Little is a 79 y.o. male with history of GERD,schizoaffective schizophrenia, dysphagia, CAD, pulmonary fibrosis on home oxygen Covid test came back positive. BSE 09/18/19 placed on nectar and Dys 3.  MBS 3/17 revealing delayed swallow, penetration (PAS 3) with thin via straw, no aspiration however increased risk therefore continued nectar thick. CXR Stable bibasilar fibrotic changes are noted. No definite acute   Subjective: pt awake in bed    Assessment / Plan / Recommendation  CHL IP CLINICAL IMPRESSIONS 11/28/2019  Clinical Impression Pt demonstrates a primary impairment in timing of oropharyngeal bolus transit. There is transient slight delay in laryngeal closure leading to inconsistent silent aspiraiton events with thin liquids. At times, aspirate/penetrate is ejected with subsequent laryngeal squeeze. At other times penetrate accumulates and is aspirated without sensation post swallow. A chin tuck did not consistently prevent aspiration, nor did controlling bolus size or rate of intake. Given ongoing COVID recovery recommend pt continue nectar thick liquids for the next 7-14 days though eventual upgrade should be consisdered. Pt may consumed mechanical soft solids given adequate mastication.   SLP Visit Diagnosis Dysphagia,  unspecified (R13.10)  Attention and concentration deficit following --  Frontal lobe and executive function deficit following --  Impact on safety and function Moderate aspiration risk      CHL IP TREATMENT RECOMMENDATION 11/28/2019  Treatment Recommendations Therapy as outlined in treatment plan below     Prognosis 11/20/2019  Prognosis for Safe Diet Advancement Good  Barriers to Reach Goals Cognitive deficits  Barriers/Prognosis Comment --    CHL IP DIET RECOMMENDATION 11/28/2019  SLP Diet Recommendations Dysphagia 3 (Mech soft) solids;Nectar thick liquid  Liquid Administration via Cup;Straw  Medication Administration Crushed with puree  Compensations Minimize environmental distractions;Slow rate;Small sips/bites  Postural Changes Seated upright at 90 degrees      CHL IP OTHER RECOMMENDATIONS 11/20/2019  Recommended Consults --  Oral Care Recommendations --  Other Recommendations Order thickener from pharmacy      CHL IP FOLLOW UP RECOMMENDATIONS 11/28/2019  Follow up Recommendations Skilled Nursing facility      [facility] IP FREQUENCY AND DURATION 11/28/2019  Speech Therapy Frequency (ACUTE ONLY) min 2x/week  Treatment Duration 2 weeks           CHL IP ORAL PHASE 11/28/2019  Oral Phase WFL  Oral - Pudding Teaspoon --  Oral - Pudding Cup --  Oral - Honey Teaspoon --  Oral - Honey Cup --  Oral - Nectar Teaspoon --  Oral - Nectar Cup --  Oral - Nectar Straw --  Oral - Thin Teaspoon --  Oral - Thin Cup --  Oral - Thin Straw --  Oral - Puree --  Oral - Mech Soft --  Oral - Regular --  Oral - Multi-Consistency --  Oral - Pill --  Oral Phase - Comment --    CHL IP PHARYNGEAL PHASE 11/28/2019  Pharyngeal Phase Impaired  Pharyngeal- Pudding Teaspoon --  Pharyngeal --  Pharyngeal- Pudding Cup --  Pharyngeal --  Pharyngeal- Honey Teaspoon --  Pharyngeal --  Pharyngeal- Honey Cup --  Pharyngeal --  Pharyngeal- Nectar Teaspoon --  Pharyngeal --  Pharyngeal- Nectar Cup  WFL  Pharyngeal --  Pharyngeal- Nectar Straw WFL  Pharyngeal --  Pharyngeal- Thin Teaspoon --  Pharyngeal --  Pharyngeal- Thin Cup Delayed swallow initiation-pyriform sinuses;Penetration/Aspiration before swallow;Penetration/Aspiration during swallow;Trace aspiration  Pharyngeal Material enters airway, passes BELOW cords without attempt by patient to eject out (silent aspiration);Material enters airway, passes BELOW cords then ejected out;Material enters airway, CONTACTS cords and then ejected out;Material does not enter airway  Pharyngeal- Thin Straw Delayed swallow initiation-pyriform sinuses;Penetration/Aspiration before swallow;Penetration/Aspiration during swallow;Trace aspiration  Pharyngeal Material enters airway, passes BELOW cords without attempt by patient to eject out (silent aspiration);Material enters airway, passes BELOW cords then ejected out;Material enters airway, CONTACTS cords and then ejected out;Material does not enter airway  Pharyngeal- Puree Pharyngeal residue - valleculae  Pharyngeal --  Pharyngeal- Mechanical Soft --  Pharyngeal --  Pharyngeal- Regular Pharyngeal residue - valleculae  Pharyngeal --  Pharyngeal- Multi-consistency --  Pharyngeal --  Pharyngeal- Pill --  Pharyngeal --  Pharyngeal Comment --     No flowsheet data found.  Herbie Baltimore, MA CCC-SLP  Acute Rehabilitation Services Pager [phone] Office [phone]  Lynann Beaver 11/28/2019, 3:27 PM

## 2019-11-29 ENCOUNTER — Other Ambulatory Visit: Payer: Self-pay

## 2019-11-29 ENCOUNTER — Institutional Professional Consult (permissible substitution): Payer: Medicare Other | Admitting: Internal Medicine

## 2019-11-29 LAB — GLUCOSE, CAPILLARY
Glucose-Capillary: 109 mg/dL — ABNORMAL HIGH (ref 70–99)
Glucose-Capillary: 155 mg/dL — ABNORMAL HIGH (ref 70–99)
Glucose-Capillary: 155 mg/dL — ABNORMAL HIGH (ref 70–99)
Glucose-Capillary: 94 mg/dL (ref 70–99)

## 2019-11-29 MED ORDER — ORAL CARE MOUTH RINSE
15.0000 mL | Freq: Two times a day (BID) | OROMUCOSAL | Status: DC
  Administered 2019-11-29 – 2019-11-30 (×3): 15 mL via OROMUCOSAL

## 2019-11-29 NOTE — NC FL2 (Signed)
[place] LEVEL OF CARE SCREENING TOOL     IDENTIFICATION  Patient Name: Robert Little Birthdate: 1941/05/11 Sex: male Admission Date (Current Location): 11/12/2019  [facility] and [place] Number:  Herbalist and Address:  [facility] [place] [place] [address] [place], [place] [address]      Provider Number: [id number]  Attending Physician Name and Address:  Thurnell Lose, MD  Relative Name and Phone Number:  Shella Spearing [phone]    Current Level of Care: Hospital Recommended Level of Care: [place] Prior Approval Number:    Date Approved/Denied:   PASRR Number: [id number]  expires 12/23/19  Discharge Plan: SNF    Current Diagnoses: Patient Active Problem List   Diagnosis Date Noted  . Sepsis due to COVID-19 ([place]) 11/12/2019  . Hypoglycemia 10/28/2019  . Hyponatremia 10/28/2019  . Acute respiratory failure with hypoxia ([place]) 09/17/2019  . Positive D dimer 09/17/2019  . Acute on chronic diastolic CHF (congestive heart failure) ([place]) 09/17/2019  . Chronic diastolic CHF (congestive heart failure), NYHA class 1 ([place]) 09/17/2019  . Acute hypoxemic respiratory failure ([place]) 09/17/2019  . Syncope 09/05/2019  . Hypocalcemia 09/05/2019  . Hypomagnesemia 09/05/2019  . CKD (chronic kidney disease), stage IV ([place]) 09/05/2019  . Acute urinary retention 05/18/2018  . UTI (urinary tract infection) 05/15/2018  . Sepsis ([place]) 05/15/2018  . Hypokalemia 05/15/2018  . AKI (acute kidney injury) ([place]) 05/15/2018  . HLD (hyperlipidemia) 05/15/2018  . Coronary artery disease involving native coronary artery of native heart without angina pectoris   . Essential hypertension   . Pulmonary fibrosis ([place])   . Schizo affective schizophrenia ([place])   . Normocytic anemia     Orientation RESPIRATION BLADDER Height & Weight     Self, Place, Time  O2(2L O2 as needed) Continent Weight: 153 lb (69.4 kg) Height:  5\' 8"  (172.7  cm)  BEHAVIORAL SYMPTOMS/MOOD NEUROLOGICAL BOWEL NUTRITION STATUS      Continent Diet(Please see DC Summary)  AMBULATORY STATUS COMMUNICATION OF NEEDS Skin   Limited Assist Verbally Normal                       Personal Care Assistance Level of Assistance  Bathing, Dressing, Feeding Bathing Assistance: Limited assistance Feeding assistance: Independent Dressing Assistance: Maximum assistance     Functional Limitations Info  Sight, Hearing, Speech Sight Info: Adequate Hearing Info: Adequate Speech Info: Adequate    SPECIAL CARE FACTORS FREQUENCY  PT (By licensed PT), OT (By licensed OT)     PT Frequency: 4x/week OT Frequency: 3x/week            Contractures Contractures Info: Not present    Additional Factors Info  Code Status, Allergies, Isolation Precautions, Psychotropic Code Status Info: Full Allergies Info: NKA Psychotropic Info: Klonopin   Isolation Precautions Info: COVID+     Current Medications (11/29/2019):  This is the current hospital active medication list Current Facility-Administered Medications  Medication Dose Route Frequency Provider Last Rate Last Admin  . acetaminophen (TYLENOL) tablet 650 mg  650 mg Oral Q6H PRN Elgergawy, Silver Huguenin, MD   650 mg at 11/22/19 1349  . albuterol (VENTOLIN HFA) 108 (90 Base) MCG/ACT inhaler 2 puff  2 puff Inhalation Q2200 Thurnell Lose, MD   2 puff at 11/28/19 2120  . ALPRAZolam Duanne Moron) tablet 0.5 mg  0.5 mg Oral BID PRN Thurnell Lose, MD   0.5 mg at 11/22/19 1349  . amiodarone (PACERONE) tablet  200 mg  200 mg Oral Daily Thurnell Lose, MD   200 mg at 11/29/19 0745  . aspirin EC tablet 81 mg  81 mg Oral Daily Rise Patience, MD   81 mg at 11/29/19 0745  . atorvastatin (LIPITOR) tablet 20 mg  20 mg Oral QHS Rise Patience, MD   20 mg at 11/28/19 2121  . Chlorhexidine Gluconate Cloth 2 % PADS 6 each  6 each Topical Daily Thurnell Lose, MD   6 each at 11/29/19 0745  . clonazePAM (KLONOPIN)  tablet 0.5 mg  0.5 mg Oral QHS Elgergawy, Silver Huguenin, MD   0.5 mg at 11/28/19 2121  . feeding supplement (ENSURE ENLIVE) (ENSURE ENLIVE) liquid 237 mL  237 mL Oral BID BM Elgergawy, Silver Huguenin, MD   237 mL at 11/29/19 1355  . feeding supplement (PRO-STAT SUGAR FREE 64) liquid 30 mL  30 mL Oral BID Elgergawy, Silver Huguenin, MD   30 mL at 11/29/19 0744  . ferrous sulfate tablet 325 mg  325 mg Oral BID WC Rise Patience, MD   325 mg at 11/29/19 0744  . fluticasone furoate-vilanterol (BREO ELLIPTA) 200-25 MCG/INH 1 puff  1 puff Inhalation Daily Rise Patience, MD   1 puff at 11/29/19 0743  . guaiFENesin-dextromethorphan (ROBITUSSIN DM) 100-10 MG/5ML syrup 5 mL  5 mL Oral Q4H PRN Elgergawy, Silver Huguenin, MD   5 mL at 11/28/19 2129  . heparin injection 5,000 Units  5,000 Units Subcutaneous Q8H Thurnell Lose, MD   5,000 Units at 11/29/19 1345  . insulin aspart (novoLOG) injection 0-6 Units  0-6 Units Subcutaneous TID WC Rise Patience, MD   1 Units at 11/29/19 1139  . insulin aspart (novoLOG) injection 3 Units  3 Units Subcutaneous TID WC Elgergawy, Silver Huguenin, MD   3 Units at 11/29/19 1139  . lidocaine (XYLOCAINE) 2 % jelly 1 application  1 application Urethral Once Blount, Scarlette Shorts T, NP      . MEDLINE mouth rinse  15 mL Mouth Rinse BID Thurnell Lose, MD   15 mL at 11/29/19 0745  . metoprolol tartrate (LOPRESSOR) injection 5 mg  5 mg Intravenous Q4H PRN Elgergawy, Silver Huguenin, MD   5 mg at 11/23/19 1003  . midodrine (PROAMATINE) tablet 5 mg  5 mg Oral TID WC Elgergawy, Silver Huguenin, MD   5 mg at 11/29/19 1138  . ondansetron (ZOFRAN) injection 4 mg  4 mg Intravenous Q6H PRN Rise Patience, MD      . pantoprazole (PROTONIX) EC tablet 40 mg  40 mg Oral Daily Rise Patience, MD   40 mg at 11/29/19 0744  . QUEtiapine (SEROQUEL) tablet 25 mg  25 mg Oral QHS Elgergawy, Silver Huguenin, MD   25 mg at 11/28/19 2121  . Resource ThickenUp Clear   Oral PRN Thurnell Lose, MD      . tamsulosin [facility])  capsule 0.4 mg  0.4 mg Oral Daily Rise Patience, MD   0.4 mg at 11/29/19 0745  . umeclidinium bromide (INCRUSE ELLIPTA) 62.5 MCG/INH 1 puff  1 puff Inhalation Daily Rise Patience, MD   1 puff at 11/29/19 0744  . vitamin B-12 (CYANOCOBALAMIN) tablet 500 mcg  500 mcg Oral BID Rise Patience, MD   500 mcg at 11/29/19 0745  . zolpidem (AMBIEN) tablet 5 mg  5 mg Oral QHS PRN Thurnell Lose, MD   5 mg at 11/23/19 2329     Discharge Medications:  Please see discharge summary for a list of discharge medications.  Relevant Imaging Results:  Relevant Lab Results:   Additional Information SSN: [id number]  [place], LCSW

## 2019-11-29 NOTE — Progress Notes (Signed)
Initial Nutrition Assessment  DOCUMENTATION CODES:   Not applicable  INTERVENTION:  Continue Ensure Enlive po BID (thickened to appropriate consistency), each supplement provides 350 kcal and 20 grams of protein  Continue 30 ml Prostat po BID, each supplement provides 100 kcal and 15 grams of protein.   Encourage adequate PO intake.   NUTRITION DIAGNOSIS:   Increased nutrient needs related to acute illness(COVID) as evidenced by estimated needs.  GOAL:   Patient will meet greater than or equal to 90% of their needs  MONITOR:   PO intake, Supplement acceptance, Skin, Weight trends, Labs, I & O's  REASON FOR ASSESSMENT:   Malnutrition Screening Tool    ASSESSMENT:   79 y.o. male with history of CAD, pulmonary fibrosis, anemia, hypertension, diabetes mellitus, chronic kidney disease presents with cough, weakness, and poor po. diagnosed with acute hypoxic respiratory failure due to COVID-19 pneumonia, dehydration AKI and NSTEMI. Pt with Sepsis with Acute Hypoxic Resp. Failure due to Acute Covid 19 Viral Pneumonitis.  RD working remotely.  Pt unavailable during attempted time of contact. Pt currently on a dysphagia 2 diet with nectar thick liquids. Meal completion has been 15-50%. Pt currently has Ensure and Prostat ordered and has been consuming them. RD to continue with current orders to aid in caloric and protein needs.   Unable to complete Nutrition-Focused physical exam at this time.   Labs and medications reviewed.   Diet Order:   Diet Order            DIET DYS 2 Room service appropriate? No; Fluid consistency: Nectar Thick  Diet effective now              EDUCATION NEEDS:   Not appropriate for education at this time  Skin:  Skin Assessment: Skin Integrity Issues: Skin Integrity Issues:: Stage II Stage II: L ear  Last BM:  5/26  Height:   Ht Readings from Last 1 Encounters:  11/13/19 5\' 8"  (1.727 m)    Weight:   Wt Readings from Last 1  Encounters:  11/13/19 69.4 kg    BMI:  Body mass index is 23.26 kg/m.  Estimated Nutritional Needs:   Kcal:  1850-2100  Protein:  90-105 grams  Fluid:  >/= 1.8 L/day   Corrin Parker, MS, RD, LDN RD pager number/after hours weekend pager number on Amion.

## 2019-11-29 NOTE — Progress Notes (Addendum)
PROGRESS NOTE                                                                                                                                                                                                             Patient Demographics:    Robert Little, is a 79 y.o. male, DOB - January 31, 1941, ZOX:[id number]  Outpatient Primary MD for the patient is Care, [name] [facility]    LOS - 18  Admit date - 11/12/2019    Chief Complaint  Patient presents with  . Shortness of Breath       Brief Narrative   Robert Little is a 79 y.o. male with history of his affective disorder, CAD, pulmonary fibrosis on home oxygen usually 2 L anemia hypertension diabetes mellitus recently admitted for hypoglycemia with use of sulfonylureas in the setting of chronic kidney disease was brought to the ER after patient has been having productive cough over the last 3 days feeling weak and tired not eating well.  ER he was diagnosed with acute hypoxic respiratory failure due to COVID-19 pneumonia, dehydration AKI and NSTEMI.  Was complicated by runs of A. fib with RVR, dehydration, hypotension and AKI.  As well anemia requiring PRBC transfusion.   Subjective:   Patient in bed, appears comfortable, denies any headache, no fever, no chest pain or pressure, no shortness of breath , no abdominal pain. No focal weakness.    Assessment  & Plan :   Sepsis with Acute Hypoxic Resp. Failure due to Acute Covid 19 Viral Pneumonitis during the ongoing 2020 Covid 19 Pandemic  - he had moderate disease was treated with IV remdesivir and steroids, now back to baseline of 2 to 3 L nasal cannula oxygen.  This problem has resolved.   SpO2: 97 % O2 Flow Rate (L/min): 2 L/min  Recent Labs  Lab 11/24/19 0717 11/25/19 0332 11/26/19 0230 11/27/19 0350  CRP 6.4* 10.2* 15.4* 11.9*  DDIMER 1.57* 1.05* 1.60* 1.97*  BNP  --   --  259.2*  --     Hepatic Function  Latest Ref Rng & Units 11/27/2019 11/26/2019 11/25/2019  Total Protein 6.5 - 8.1 g/dL 5.3(L) 5.3(L) 5.8(L)  Albumin 3.5 - 5.0 g/dL 1.8(L) 2.0(L) 2.1(L)  AST 15 - 41 U/L _0 ALT 0 - 44 U/L _1 Alk  Phosphatase 38 - 126 U/L 67 66 73  Total Bilirubin 0.3 - 1.2 mg/dL 1.0 1.1 1.3(H)    Dehydration with AKI on underlying CKD 4 - -Baseline creatinine 2.5, at some point he had worsening renal function, and some evidence of volume depletion and low blood pressure, AKI has resolved creatinine close to baseline of 2.5.   Dysphagia.  Speech therapy following currently on dysphagia 3 diet with nectar thick liquids, feeding assistance and aspiration precautions.  Hypernatremia - resolved after IV fluids and hydration.  Paroxysmal atrial fibrillation with [place] vas 2 score of at least 3 with RVR and Hypotension - Heart rate was always difficult to control in the setting of soft blood pressure, and holding beta-blockers, patient was started on midodrine, and IV fluid to assist with blood pressure, continue amiodarone for now and hold beta-blockers due to hypotension.  Case was discussed with Dr. Ellyn Hack he is an extremely high risk candidate for anticoagulation continue aspirin.   History of CAD. - Not NSTEMI.  Could be due to demand ischemia from hypoxia, continue aspirin, statin and beta-blocker (if blood pressure can tolerate )for secondary prevention, stable echocardiogram, stable EKG and chest pain-free seen by cardiology continue present care with outpatient cardiology follow-up post discharge.  History of schizoaffective disorder -   takes monthly injection of Invega.  Follows at [facility] follow up.  According to the POA ports his [name] has been stopped by his primary psychiatrist in April of this year, given worsening renal function , so I have discontinued his Cogentin per inpatient psych recommendation. He is back on his Seroquel (held briefly secondary to sedation couple days ago), and  Klonopin dose has been lowered to nightly only.  History of pulmonary fibrosis now on 2 L home oxygen. - Continue supportive care with a liters as needed.  Outpatient pulmonary follow-up.  Anemia of chronic disease. Hemoglobin has dropped to 6.9 this morning, no evidence of GI bleed, patient was transfused 1 unit PRBC 5/24 especially with his known history of CAD.  With good response, hemoglobin this morning is 9.  Chronic diastolic CHF.  EF on recent echocardiogram was 60%.  Appears dehydrated, hold Lasix, continue with IV fluids .  Chronically elevated D-dimers.  - Previously has had negative lower extremity venous duplex, he is on subcu heparin for DVT prophylaxis  Essential hypertension/hypotension  -IV fluid bolus on 11/28/2019, continue midodrine and hold beta-blockers.  Hypokalemia - repleted.  DM type II.  For now sliding scale, addedm low-dose Lantus as CBGs are running high due to steroid use.   Lab Results  Component Value Date   HGBA1C 7.0 (H) 09/20/2019   CBG (last 3)  Recent Labs    11/29/19 2047 11/30/19 0732 11/30/19 1141  GLUCAP 94 121* 233*     Condition - Extremely Guarded  Family Communication  : Discussed with POA [name] 5/24 and have updated him.   I have explained the tenuous status of this patient is extremely frail, with multiple comorbidities.  Code Status :  DNR  Consults  :  Cards, Psych  Disposition Plan  :    Status is: Inpatient  Remains inpatient appropriate because:Inpatient level of care appropriate due to severity of illness  Dispo: The patient is from: Group home              Anticipated d/c is to: Group home/SNF              Anticipated d/c date is: 1- 2  days              Patient currently is not medically stable to d/c.  Is improving, but remains with soft blood pressure still requiring IV fluids.   Procedures  :    TTE - 1. Left ventricular ejection fraction, by estimation, is 60 to 65%. The left ventricle has normal  function. The left ventricle has no regional wall motion abnormalities. There is mild concentric left ventricular hypertrophy. Left ventricular diastolic parameters are consistent with Grade II diastolic dysfunction  (pseudonormalization). Elevated left atrial pressure.  2. Right ventricular systolic function is normal. The right ventricular size is normal. There is normal pulmonary artery systolic pressure.  3. Left atrial size was mildly dilated.  4. The mitral valve is normal in structure. Mild mitral valve regurgitation. No evidence of mitral stenosis.  5. The aortic valve is normal in structure. Aortic valve regurgitation is not visualized. No aortic stenosis is present.  6. The inferior vena cava is normal in size with greater than 50% respiratory variability, suggesting right atrial pressure of 3 mmHg.   PUD Prophylaxis :  None  DVT Prophylaxis  :   Heparin   Lab Results  Component Value Date   PLT 166 11/28/2019    Diet :  Diet Order            DIET DYS 2 Room service appropriate? No; Fluid consistency: Nectar Thick  Diet effective now               Inpatient Medications  Scheduled Meds: . albuterol  2 puff Inhalation Q2200  . amiodarone  200 mg Oral Daily  . aspirin EC  81 mg Oral Daily  . atorvastatin  20 mg Oral QHS  . Chlorhexidine Gluconate Cloth  6 each Topical Daily  . clonazePAM  0.5 mg Oral QHS  . feeding supplement (ENSURE ENLIVE)  237 mL Oral BID BM  . feeding supplement (PRO-STAT SUGAR FREE 64)  30 mL Oral BID  . ferrous sulfate  325 mg Oral BID WC  . fluticasone furoate-vilanterol  1 puff Inhalation Daily  . heparin injection (subcutaneous)  5,000 Units Subcutaneous Q8H  . insulin aspart  0-6 Units Subcutaneous TID WC  . insulin aspart  3 Units Subcutaneous TID WC  . lidocaine  1 application Urethral Once  . mouth rinse  15 mL Mouth Rinse BID  . midodrine  5 mg Oral TID WC  . pantoprazole  40 mg Oral Daily  . QUEtiapine  25 mg Oral QHS  .  tamsulosin  0.4 mg Oral Daily  . umeclidinium bromide  1 puff Inhalation Daily  . vitamin B-12  500 mcg Oral BID   Continuous Infusions:  PRN Meds:.acetaminophen, ALPRAZolam, guaiFENesin-dextromethorphan, metoprolol tartrate, [DISCONTINUED] ondansetron **OR** ondansetron (ZOFRAN) IV, Resource ThickenUp Clear, zolpidem  Antibiotics  :    Anti-infectives (From admission, onward)   Start     Dose/Rate Route Frequency Ordered Stop   11/13/19 1600  remdesivir 100 mg in sodium chloride 0.9 % 100 mL IVPB     100 mg 200 mL/hr over 30 Minutes Intravenous Daily 11/12/19 2146 11/16/19 1700   11/12/19 2300  remdesivir 200 mg in sodium chloride 0.9% 250 mL IVPB     200 mg 580 mL/hr over 30 Minutes Intravenous Once 11/12/19 2146 11/13/19 0104   11/12/19 2130  cefTRIAXone (ROCEPHIN) 2 g in sodium chloride 0.9 % 100 mL IVPB  Status:  Discontinued     2 g 200 mL/hr over  30 Minutes Intravenous  Once 11/12/19 2056 11/12/19 2125   11/12/19 2130  azithromycin (ZITHROMAX) 500 mg in sodium chloride 0.9 % 250 mL IVPB  Status:  Discontinued     500 mg 250 mL/hr over 60 Minutes Intravenous  Once 11/12/19 2056 11/12/19 2125        Lala Lund M.D on 11/30/2019 at 4:39 PM  To page go to [email or web address] - password [email or web address]  See all Orders from today for further details    Objective:   Vitals:   11/30/19 0528 11/30/19 0600 11/30/19 0700 11/30/19 1516  BP: (!) 117/48  (!) 111/47 (!) 120/56  Pulse: 71  81 (!) 102  Resp: (!) 28 (!) 27 (!) 22 20  Temp: 97.6 F (36.4 C)  98.7 F (37.1 C) 97.7 F (36.5 C)  TempSrc: Oral  Oral Oral  SpO2: (!) 88% 97% 96% 97%  Weight:      Height:        Wt Readings from Last 3 Encounters:  11/13/19 69.4 kg  10/28/19 69.4 kg  10/10/19 69.4 kg     Intake/Output Summary (Last 24 hours) at 11/30/2019 1639 Last data filed at 11/30/2019 1421 Gross per 24 hour  Intake 820 ml  Output 1500 ml  Net -680 ml     Physical Exam  Extremely frail elderly white male lying  in hospital bed in no apparent distress, no apparent focal neurological deficits, [place].AT,PERRAL Supple Neck,No JVD, No cervical lymphadenopathy appriciated.  Symmetrical Chest wall movement, Good air movement bilaterally, CTAB RRR,No Gallops, Rubs or new Murmurs, No Parasternal Heave +ve B.Sounds, Abd Soft, No tenderness, No organomegaly appriciated, No rebound - guarding or rigidity. No Cyanosis, Clubbing or edema, No new Rash or bruise    Data Review:    CBC Recent Labs  Lab 11/24/19 0717 11/24/19 0717 11/25/19 0332 11/26/19 0230 11/26/19 1909 11/27/19 0350 11/28/19 0339  WBC 10.9*  --  12.5* 11.3*  --  8.3 5.9  HGB 8.0*   < > 8.0* 6.9* 8.8* 9.0* 8.4*  HCT 26.1*   < > 26.4* 22.5* 27.4* 28.4* 26.0*  PLT 198  --  174 185  --  150 166  MCV 95.3  --  95.7 94.9  --  93.7 93.2  MCH 29.2  --  29.0 29.1  --  29.7 30.1  MCHC 30.7  --  30.3 30.7  --  31.7 32.3  RDW 17.2*  --  17.1* 16.7*  --  15.9* 15.5  LYMPHSABS 1.2  --  1.1 1.3  --  1.4  --   MONOABS 0.8  --  1.0 1.0  --  0.9  --   EOSABS 0.1  --  0.1 0.1  --  0.1  --   BASOSABS 0.0  --  0.0 0.0  --  0.0  --    < > = values in this interval not displayed.    Chemistries  Recent Labs  Lab 11/24/19 0717 11/25/19 0332 11/26/19 0230 11/27/19 0350 11/28/19 0339  NA 146* 146* 141 140 137  K 3.8 4.1 3.9 3.6 3.8  CL 116* 115* 110 112* 107  CO2 22 21* 20* 20* 19*  GLUCOSE 206* 136* 133* 92 180*  BUN 41* 35* 34* 33* 30*  CREATININE 2.65* 2.43* 2.29* 2.23* 2.01*  CALCIUM 8.0* 7.8* 7.8* 7.6* 7.7*  AST _0 --   ALT _1 --   ALKPHOS 71 73 66 67  --   BILITOT  0.7 1.3* 1.1 1.0  --      ------------------------------------------------------------------------------------------------------------------ No results for input(s): CHOL, HDL, LDLCALC, TRIG, CHOLHDL, LDLDIRECT in the last 72 hours.  Lab Results  Component Value Date   HGBA1C 7.0 (H) 09/20/2019    ------------------------------------------------------------------------------------------------------------------ No results for input(s): TSH, T4TOTAL, T3FREE, THYROIDAB in the last 72 hours.  Invalid input(s): FREET3  Cardiac Enzymes No results for input(s): CKMB, TROPONINI, MYOGLOBIN in the last 168 hours.  Invalid input(s): CK ------------------------------------------------------------------------------------------------------------------    Component Value Date/Time   BNP 259.2 (H) 11/26/2019 0230    Micro Results No results found for this or any previous visit (from the past 240 hour(s)).  Radiology Reports DG Chest Port 1 View  Result Date: 11/20/2019 CLINICAL DATA:  Shortness of breath, cough. EXAM: PORTABLE CHEST 1 VIEW COMPARISON:  Nov 19, 2019. FINDINGS: Stable cardiomediastinal silhouette. Sternotomy wires are noted. No pneumothorax or pleural effusion is noted. Stable fibrotic changes noted in the lung bases. No definite acute abnormality is noted. Bony thorax is unremarkable. IMPRESSION: Stable bibasilar fibrotic changes are noted. No definite acute cardiopulmonary abnormality seen. Electronically Signed   By: Marijo Conception M.D.   On: 11/20/2019 08:32   DG Chest Port 1 View  Result Date: 11/19/2019 CLINICAL DATA:  Shortness of breath EXAM: PORTABLE CHEST 1 VIEW COMPARISON:  Nov 12, 2019 chest radiograph and chest CT September 19, 2019 FINDINGS: There is fibrotic change scattered throughout the lungs, most notably in the mid and lower lung regions, essentially stable. No new opacity evident. Heart is upper normal in size with pulmonary vascularity normal. Patient is status post coronary artery bypass grafting. No adenopathy appreciable. There is aortic atherosclerosis. No evident bone lesions. IMPRESSION: Stable fibrotic type change, most severe in the mid and lower lung regions. No new opacity evident. Stable cardiac silhouette. Postoperative changes noted. Aortic  Atherosclerosis (ICD10-I70.0). Electronically Signed   By: Lowella Grip III M.D.   On: 11/19/2019 08:24   DG Chest Port 1 View  Result Date: 11/12/2019 CLINICAL DATA:  Shortness of breath and sepsis. Fever. Productive cough. EXAM: PORTABLE CHEST 1 VIEW COMPARISON:  10/28/2019.  09/19/2019. FINDINGS: The patient has a history of pulmonary fibrosis and emphysema. Chronic lung markings appear similar allowing for technical differences. Upper lobe emphysema as seen previously. No sign of dense consolidation, collapse or effusion. Previous median sternotomy and CABG. Aortic atherosclerosis. No acute bone finding. IMPRESSION: Portable film with the poor inspiration. Chronic fibrotic lung disease and upper lung emphysema. No sign of acute infiltrate, collapse or effusion. Electronically Signed   By: Nelson Chimes M.D.   On: 11/12/2019 19:37   DG Swallowing Func-Speech Pathology  Result Date: 11/28/2019 Objective Swallowing Evaluation: Type of Study: MBS-Modified Barium Swallow Study  Patient Details Name: Daquarius Dubeau MRN: [id number] Date of Birth: 07-26-1940 Today's Date: 11/28/2019 Time: SLP Start Time (ACUTE ONLY): 1415 -SLP Stop Time (ACUTE ONLY): 1440 SLP Time Calculation (min) (ACUTE ONLY): 25 min Past Medical History: Past Medical History: Diagnosis Date . Anemia  . Coronary artery disease  . GERD (gastroesophageal reflux disease)  . Hypertension  . Pulmonary fibrosis ([place])  . Schizo affective schizophrenia [facility])  Past Surgical History: Past Surgical History: Procedure Laterality Date . CORONARY ARTERY BYPASS GRAFT   HPI: Dorrell Mitcheltree is a 79 y.o. male with history of GERD,schizoaffective schizophrenia, dysphagia, CAD, pulmonary fibrosis on home oxygen Covid test came back positive. BSE 09/18/19 placed on nectar and Dys 3.  MBS 3/17 revealing delayed swallow, penetration (PAS 3) with thin  via straw, no aspiration however increased risk therefore continued nectar thick. CXR Stable bibasilar fibrotic changes  are noted. No definite acute  Subjective: pt awake in bed Assessment / Plan / Recommendation CHL IP CLINICAL IMPRESSIONS 11/28/2019 Clinical Impression Pt demonstrates a primary impairment in timing of oropharyngeal bolus transit. There is transient slight delay in laryngeal closure leading to inconsistent silent aspiraiton events with thin liquids. At times, aspirate/penetrate is ejected with subsequent laryngeal squeeze. At other times penetrate accumulates and is aspirated without sensation post swallow. A chin tuck did not consistently prevent aspiration, nor did controlling bolus size or rate of intake. Given ongoing COVID recovery recommend pt continue nectar thick liquids for the next 7-14 days though eventual upgrade should be consisdered. Pt may consumed mechanical soft solids given adequate mastication.   SLP Visit Diagnosis Dysphagia, unspecified (R13.10) Attention and concentration deficit following -- Frontal lobe and executive function deficit following -- Impact on safety and function Moderate aspiration risk   CHL IP TREATMENT RECOMMENDATION 11/28/2019 Treatment Recommendations Therapy as outlined in treatment plan below   Prognosis 11/20/2019 Prognosis for Safe Diet Advancement Good Barriers to Reach Goals Cognitive deficits Barriers/Prognosis Comment -- CHL IP DIET RECOMMENDATION 11/28/2019 SLP Diet Recommendations Dysphagia 3 (Mech soft) solids;Nectar thick liquid Liquid Administration via Cup;Straw Medication Administration Crushed with puree Compensations Minimize environmental distractions;Slow rate;Small sips/bites Postural Changes Seated upright at 90 degrees   CHL IP OTHER RECOMMENDATIONS 11/20/2019 Recommended Consults -- Oral Care Recommendations -- Other Recommendations Order thickener from pharmacy   CHL IP FOLLOW UP RECOMMENDATIONS 11/28/2019 Follow up Recommendations Skilled Nursing facility   [facility] IP FREQUENCY AND DURATION 11/28/2019 Speech Therapy Frequency (ACUTE ONLY) min 2x/week Treatment  Duration 2 weeks      CHL IP ORAL PHASE 11/28/2019 Oral Phase WFL Oral - Pudding Teaspoon -- Oral - Pudding Cup -- Oral - Honey Teaspoon -- Oral - Honey Cup -- Oral - Nectar Teaspoon -- Oral - Nectar Cup -- Oral - Nectar Straw -- Oral - Thin Teaspoon -- Oral - Thin Cup -- Oral - Thin Straw -- Oral - Puree -- Oral - Mech Soft -- Oral - Regular -- Oral - Multi-Consistency -- Oral - Pill -- Oral Phase - Comment --  CHL IP PHARYNGEAL PHASE 11/28/2019 Pharyngeal Phase Impaired Pharyngeal- Pudding Teaspoon -- Pharyngeal -- Pharyngeal- Pudding Cup -- Pharyngeal -- Pharyngeal- Honey Teaspoon -- Pharyngeal -- Pharyngeal- Honey Cup -- Pharyngeal -- Pharyngeal- Nectar Teaspoon -- Pharyngeal -- Pharyngeal- Nectar Cup WFL Pharyngeal -- Pharyngeal- Nectar Straw WFL Pharyngeal -- Pharyngeal- Thin Teaspoon -- Pharyngeal -- Pharyngeal- Thin Cup Delayed swallow initiation-pyriform sinuses;Penetration/Aspiration before swallow;Penetration/Aspiration during swallow;Trace aspiration Pharyngeal Material enters airway, passes BELOW cords without attempt by patient to eject out (silent aspiration);Material enters airway, passes BELOW cords then ejected out;Material enters airway, CONTACTS cords and then ejected out;Material does not enter airway Pharyngeal- Thin Straw Delayed swallow initiation-pyriform sinuses;Penetration/Aspiration before swallow;Penetration/Aspiration during swallow;Trace aspiration Pharyngeal Material enters airway, passes BELOW cords without attempt by patient to eject out (silent aspiration);Material enters airway, passes BELOW cords then ejected out;Material enters airway, CONTACTS cords and then ejected out;Material does not enter airway Pharyngeal- Puree Pharyngeal residue - valleculae Pharyngeal -- Pharyngeal- Mechanical Soft -- Pharyngeal -- Pharyngeal- Regular Pharyngeal residue - valleculae Pharyngeal -- Pharyngeal- Multi-consistency -- Pharyngeal -- Pharyngeal- Pill -- Pharyngeal -- Pharyngeal Comment --  No  flowsheet data found. DeBlois, Katherene Ponto 11/28/2019, 3:29 PM              ECHOCARDIOGRAM COMPLETE  Result Date: 11/14/2019  ECHOCARDIOGRAM REPORT   Patient Name:   LANE ELAND Date of Exam: 11/14/2019 Medical Rec #:  [id number]    Height:       68.0 in Accession #:    [id number]   Weight:       153.0 lb Date of Birth:  [birth year]    BSA:          1.824 m Patient Age:    48 years     BP:           131/61 mmHg Patient Gender: M            HR:           58 bpm. Exam Location:  Inpatient Procedure: 2D Echo, Cardiac Doppler and Color Doppler Indications:    Elevated Troponin  History:        Patient has prior history of Echocardiogram examinations, most                 recent 09/06/2019. CHF, CAD; Risk Factors:Hypertension,                 Dyslipidemia and Former Smoker.  Sonographer:    Vickie Epley RDCS Referring Phys: [id number] RHONDA G BARRETT  Sonographer Comments: Covid positive. IMPRESSIONS  1. Left ventricular ejection fraction, by estimation, is 60 to 65%. The left ventricle has normal function. The left ventricle has no regional wall motion abnormalities. There is mild concentric left ventricular hypertrophy. Left ventricular diastolic parameters are consistent with Grade II diastolic dysfunction (pseudonormalization). Elevated left atrial pressure.  2. Right ventricular systolic function is normal. The right ventricular size is normal. There is normal pulmonary artery systolic pressure.  3. Left atrial size was mildly dilated.  4. The mitral valve is normal in structure. Mild mitral valve regurgitation. No evidence of mitral stenosis.  5. The aortic valve is normal in structure. Aortic valve regurgitation is not visualized. No aortic stenosis is present.  6. The inferior vena cava is normal in size with greater than 50% respiratory variability, suggesting right atrial pressure of 3 mmHg. FINDINGS  Left Ventricle: Left ventricular ejection fraction, by estimation, is 60 to 65%. The left ventricle has normal  function. The left ventricle has no regional wall motion abnormalities. The left ventricular internal cavity size was normal in size. There is  mild concentric left ventricular hypertrophy. Left ventricular diastolic parameters are consistent with Grade II diastolic dysfunction (pseudonormalization). Elevated left atrial pressure. Right Ventricle: The right ventricular size is normal. No increase in right ventricular wall thickness. Right ventricular systolic function is normal. There is normal pulmonary artery systolic pressure. The tricuspid regurgitant velocity is 2.25 m/s, and  with an assumed right atrial pressure of 3 mmHg, the estimated right ventricular systolic pressure is 71.6 mmHg. Left Atrium: Left atrial size was mildly dilated. Right Atrium: Right atrial size was normal in size. Pericardium: There is no evidence of pericardial effusion. Mitral Valve: The mitral valve is normal in structure. Normal mobility of the mitral valve leaflets. Mild mitral valve regurgitation. No evidence of mitral valve stenosis. Tricuspid Valve: The tricuspid valve is normal in structure. Tricuspid valve regurgitation is not demonstrated. No evidence of tricuspid stenosis. Aortic Valve: The aortic valve is normal in structure. Aortic valve regurgitation is not visualized. No aortic stenosis is present. Pulmonic Valve: The pulmonic valve was normal in structure. Pulmonic valve regurgitation is not visualized. No evidence of pulmonic stenosis. Aorta: The aortic root is normal in size and structure. Venous: The inferior vena  cava is normal in size with greater than 50% respiratory variability, suggesting right atrial pressure of 3 mmHg. IAS/Shunts: No atrial level shunt detected by color flow Doppler.  LEFT VENTRICLE PLAX 2D LVIDd:         4.70 cm      Diastology LVIDs:         3.42 cm      LV e' lateral:   7.14 cm/s LV PW:         0.80 cm      LV E/e' lateral: 12.3 LV IVS:        0.80 cm      LV e' medial:    4.62 cm/s LVOT  diam:     1.90 cm      LV E/e' medial:  19.0 LV SV:         45 LV SV Index:   25 LVOT Area:     2.84 cm  LV Volumes (MOD) LV vol d, MOD A2C: 103.0 ml LV vol d, MOD A4C: 115.0 ml LV vol s, MOD A2C: 61.9 ml LV vol s, MOD A4C: 46.6 ml LV SV MOD A2C:     41.1 ml LV SV MOD A4C:     115.0 ml LV SV MOD BP:      57.4 ml RIGHT VENTRICLE RV S prime:     10.10 cm/s TAPSE (M-mode): 1.6 cm LEFT ATRIUM             Index       RIGHT ATRIUM           Index LA diam:        4.20 cm 2.30 cm/m  RA Area:     10.30 cm LA Vol (A2C):   24.4 ml 13.38 ml/m RA Volume:   18.10 ml  9.92 ml/m LA Vol (A4C):   33.3 ml 18.26 ml/m LA Biplane Vol: 31.4 ml 17.22 ml/m  AORTIC VALVE LVOT Vmax:   70.00 cm/s LVOT Vmean:  40.000 cm/s LVOT VTI:    0.160 m  AORTA Ao Root diam: 3.10 cm MITRAL VALVE               TRICUSPID VALVE MV Area (PHT): 5.02 cm    TR Peak grad:   20.2 mmHg MV Decel Time: 151 msec    TR Vmax:        225.00 cm/s MV E velocity: 87.80 cm/s MV A velocity: 70.30 cm/s  SHUNTS MV E/A ratio:  1.25        Systemic VTI:  0.16 m                            Systemic Diam: 1.90 cm Ena Dawley MD Electronically signed by Ena Dawley MD Signature Date/Time: 11/14/2019/1:13:59 PM    Final

## 2019-11-29 NOTE — Progress Notes (Signed)
Physical Therapy Treatment Patient Details Name: Robert Little MRN: [id number] DOB: Mar 18, 1941 Today's Date: 11/29/2019    History of Present Illness 79 y.o. male with medical history significant for chronic anemia, pulmonary fibrosis, hypertension, coronary artery disease, chronic diastolic CHF, and schizoaffective disorder, presenting to the emergency department with progressive shortness of breath long with severe orthopnea starting 1 to 2 days prior to hospital visit, in the hospital work-up suggestive of CHF and acute hypoxic respiratory failure and he was admitted 09/16/19    PT Comments    Robert Little looks great today.  He is awake, talking, returning to his baseline.  He was able to walk around the room with min assist and RW.  His VSS on RA throughout mobility and he was positioned in the recliner chair with the alarm at the end of session. PT will continue to follow acutely for safe mobility progression.   Follow Up Recommendations  Home health PT;Supervision for mobility/OOB     Equipment Recommendations  None recommended by PT    Recommendations for Other Services   NA     Precautions / Restrictions Precautions Precautions: Fall;Other (comment) Precaution Comments: monitor O2 and BPs    Mobility  Bed Mobility Overal bed mobility: Needs Assistance Bed Mobility: Supine to Sit     Supine to sit: Supervision     General bed mobility comments: Supervision for safety and line management.   Transfers Overall transfer level: Needs assistance Equipment used: Rolling walker (2 wheeled) Transfers: Sit to/from Stand Sit to Stand: Min assist;+2 safety/equipment         General transfer comment: Min assist to support trunk and stabilize RW as pt came to standing EOB, second person managing lines  Ambulation/Gait Ambulation/Gait assistance: Min assist;+2 safety/equipment Gait Distance (Feet): 25 Feet Assistive device: Rolling walker (2 wheeled) Gait Pattern/deviations:  Step-through pattern;Shuffle;Trunk flexed     General Gait Details: Pt stood and walked to the door and to the far side of the room where the recliner chair is located with min assist for balance and RW. He is suprisingly doing well on his feet despite how long it has been since we attempted gait.  VSS on RA throughout.  Second person used for line management, but if he looks this good, will not be needed next session.           Balance Overall balance assessment: Needs assistance Sitting-balance support: Feet supported;Bilateral upper extremity supported Sitting balance-Leahy Scale: Fair     Standing balance support: Bilateral upper extremity supported Standing balance-Leahy Scale: Poor Standing balance comment: needs external support from RW and therapist in standing.                             Cognition Arousal/Alertness: Awake/alert Behavior During Therapy: WFL for tasks assessed/performed Overall Cognitive Status: History of cognitive impairments - at baseline                                 General Comments: Pt seems much closer to his baseline today, awake, alert, conversing, pleasant.               Pertinent Vitals/Pain Pain Assessment: No/denies pain       Prior Function            PT Goals (current goals can now be found in the care plan section) Acute Rehab PT Goals Patient  Stated Goal: to go back to his group home "when my 21 days is up" PT Goal Formulation: With patient Time For Goal Achievement: 12/13/19 Potential to Achieve Goals: Good Progress towards PT goals: Progressing toward goals    Frequency    Min 3X/week      PT Plan Discharge plan needs to be updated;Frequency needs to be updated       AM-PAC PT "6 Clicks" Mobility   Outcome Measure  Help needed turning from your back to your side while in a flat bed without using bedrails?: A Little Help needed moving from lying on your back to sitting on the side  of a flat bed without using bedrails?: A Little Help needed moving to and from a bed to a chair (including a wheelchair)?: A Little Help needed standing up from a chair using your arms (e.g., wheelchair or bedside chair)?: A Little Help needed to walk in hospital room?: A Little Help needed climbing 3-5 steps with a railing? : A Lot 6 Click Score: 17    End of Session Equipment Utilized During Treatment: Gait belt Activity Tolerance: Patient tolerated treatment well Patient left: in chair;with call bell/phone within reach;with chair alarm set Nurse Communication: Mobility status PT Visit Diagnosis: Muscle weakness (generalized) (M62.81);Difficulty in walking, not elsewhere classified (R26.2)     Time: 7353-2992 PT Time Calculation (min) (ACUTE ONLY): 17 min  Charges:  $Gait Training: 8-22 mins            Verdene Lennert, PT, DPT  Acute Rehabilitation [phone] pager #[phone] office              11/29/2019, 2:41 PM

## 2019-11-29 NOTE — TOC Progression Note (Signed)
Transition of Care [facility]) - Progression Note    Patient Details  Name: Robert Little MRN: [id number] Date of Birth: June 26, 1941  Transition of Care [facility]) CM/SW [place], LCSW Phone Number: 11/29/2019, 3:43 PM  Clinical Narrative:    Miquel Dunn reports they should be able to accept patient tomorrow if medically stable. CSW updated [name] who will be able to complete admission paperwork at the facility.    Expected Discharge Plan: Skilled Nursing Facility Barriers to Discharge: Continued Medical Work up  Expected Discharge Plan and Services Expected Discharge Plan: [place] In-house Referral: Clinical Social Work   Post Acute Care Choice: [place] Living arrangements for the past 2 months: Group Home Expected Discharge Date: 11/16/19                                     Social Determinants of Health (SDOH) Interventions    Readmission Risk Interventions Readmission Risk Prevention Plan 11/23/2019 09/20/2019  Transportation Screening Complete Complete  PCP or Specialist Appt within 3-5 Days - Complete  HRI or [place] - Complete  Social Work Consult for [place] Planning/Counseling - Complete  Palliative Care Screening - Not Applicable  Medication Review Press photographer) Complete Complete  PCP or Specialist appointment within 3-5 days of discharge Complete -  [place] or Home Care Consult Complete -  SW Recovery Care/Counseling Consult Complete -  Palliative Care Screening Not Applicable -  [place] Complete -

## 2019-11-30 LAB — GLUCOSE, CAPILLARY
Glucose-Capillary: 121 mg/dL — ABNORMAL HIGH (ref 70–99)
Glucose-Capillary: 233 mg/dL — ABNORMAL HIGH (ref 70–99)

## 2019-11-30 MED ORDER — QUETIAPINE FUMARATE 25 MG PO TABS: 25.0000 mg | ORAL_TABLET | Freq: Every day | ORAL | Status: AC

## 2019-11-30 MED ORDER — CARVEDILOL 3.125 MG PO TABS: 3.1250 mg | ORAL_TABLET | Freq: Two times a day (BID) | ORAL | 0 refills | Status: DC

## 2019-11-30 MED ORDER — AMIODARONE HCL 200 MG PO TABS: 200.0000 mg | ORAL_TABLET | Freq: Every day | ORAL | Status: DC

## 2019-11-30 MED ORDER — ENSURE ENLIVE PO LIQD: 237.0000 mL | Freq: Two times a day (BID) | ORAL | 12 refills | Status: AC

## 2019-11-30 MED ORDER — CLONAZEPAM 0.5 MG PO TABS: 0.5000 mg | ORAL_TABLET | Freq: Two times a day (BID) | ORAL | 0 refills | Status: DC

## 2019-11-30 NOTE — Progress Notes (Signed)
[facility] arrived to pick up pt.

## 2019-11-30 NOTE — Care Management Important Message (Signed)
Important Message  Patient Details  Name: Robert Little MRN: [id number] Date of Birth: 05-24-1941   Medicare Important Message Given:  Yes - Important Message mailed due to current National Emergency  Verbal consent obtained due to current National Emergency  Relationship to patient: [place] Name: [name] Call Date: 11/30/19  Time: 1129 Phone: [phone] Outcome: Spoke with contact Important Message mailed to: Patient address on file    Delorse Lek 11/30/2019, 11:29 AM

## 2019-11-30 NOTE — Discharge Instructions (Signed)
Follow with Primary MD Care, [name] in 7 days   Get CBC, CMP, 2 view Chest X ray -  checked next visit within 1 week by Primary MD    Activity: As tolerated with Full fall precautions use walker/cane & assistance as needed  Disposition SNF  Diet: Heart Healthy Low Carb  Special Instructions: If you have smoked or chewed Tobacco  in the last 2 yrs please stop smoking, stop any regular Alcohol  and or any Recreational drug use.  On your next visit with your primary care physician please Get Medicines reviewed and adjusted.  Please request your Prim.MD to go over all Hospital Tests and Procedure/Radiological results at the follow up, please get all Hospital records sent to your Prim MD by signing hospital release before you go home.  If you experience worsening of your admission symptoms, develop shortness of breath, life threatening emergency, suicidal or homicidal thoughts you must seek medical attention immediately by calling 911 or calling your MD immediately  if symptoms less severe.  You Must read complete instructions/literature along with all the possible adverse reactions/side effects for all the Medicines you take and that have been prescribed to you. Take any new Medicines after you have completely understood and accpet all the possible adverse reactions/side effects.

## 2019-11-30 NOTE — TOC Transition Note (Signed)
Transition of Care [facility]) - CM/SW Discharge Note   Patient Details  Name: Lawrnce Reyez MRN: [id number] Date of Birth: 11-08-1940  Transition of Care [facility]) CM/SW Contact:  Robert Halsted, LCSW Phone Number: 11/30/2019, 12:12 PM   Clinical Narrative:    Patient will DC to: [place] date: 11/30/19 Family notified: Shella Spearing Transport by: Corey Harold   Per MD patient ready for DC to [facility]. RN, patient, patient's family, and facility notified of DC. Discharge Summary and FL2 sent to facility. RN to call report prior to discharge [phone]). DC packet on chart. Ambulance transport requested for patient.   CSW will sign off for now as social work intervention is no longer needed. Please consult [place] again if new needs arise.      Final next level of care: Skilled Nursing Facility Barriers to Discharge: No Barriers Identified   Patient Goals and CMS Choice Patient states their goals for this hospitalization and ongoing recovery are:: Rehab and isolation CMS Medicare.gov Compare Post Acute Care list provided to:: Patient Represenative (must comment)(Guardian, Legrand Como) Choice offered to / list presented to : [place] / [place]  Discharge Placement PASRR number recieved: 11/30/19            Patient chooses bed at: [facility] Patient to be transferred to facility by: [place] Name of family member notified: Shella Spearing Patient and family notified of of transfer: 11/30/19  Discharge Plan and Services In-house Referral: Clinical Social Work   Post Acute Care Choice: [place]                               Social Determinants of Health (SDOH) Interventions     Readmission Risk Interventions Readmission Risk Prevention Plan 11/23/2019 09/20/2019  Transportation Screening Complete Complete  PCP or Specialist Appt within 3-5 Days - Complete  HRI or [place] - Complete  Social Work Consult for [place]  Planning/Counseling - Complete  Palliative Care Screening - Not Applicable  Medication Review Press photographer) Complete Complete  PCP or Specialist appointment within 3-5 days of discharge Complete -  [place] or Home Care Consult Complete -  SW Recovery Care/Counseling Consult Complete -  Palliative Care Screening Not Applicable -  [place] Complete -

## 2019-12-19 ENCOUNTER — Encounter: Payer: Medicare Other | Admitting: Physician Assistant

## 2019-12-19 NOTE — Progress Notes (Signed)
This encounter was created in error - please disregard.

## 2020-01-04 NOTE — Progress Notes (Addendum)
Cardiology Office Note   Date:  01/09/2020   ID:  Robert Little, DOB 08/02/1940, MRN [id number]  PCP:  Care, [name] Cardiologist:  Sanda Klein, MD 09/21/2019 in-hospital R Zarah [place], [facility] 10/10/2019 Electrphysiologist: None Rosaria Ferries, PA-C   No chief complaint on file.   History of Present Illness: Robert Little is a 79 y.o. male with a history of  CAD s/p prior CABG([facility] 02/2004), DM2, HTN, HLD, pulmonary fibrosis on home O2, schizoaffective disorder in a group home, CKD IV, anemia of chronic disease, and GERD. CHF admit w/ d/c 03/19 & wt 151 lbs.   Admit COVID 05/10-05/28/2021 w/ AKI, dehydration, Afib RVR, anemia s/p transfusion, alb 1.8, peak troponin 3,179>>med rx.   Robert Little presents for cardiology follow up.  Walking w/ walker. 138 on the scale today.  He says that his p.o. intake is poor and he has not been eating much.  It is unclear how much he has been drinking, but he says that he is drinking what is on his tray when he gets it.  He says he has not been doing any meal supplements, but he has twice daily Ensure on his med list.  He says he is doing well and getting stronger.  He hopes to go back to the group home soon.  He has not had any chest pain.  He has not had any palpitations.  He has not had any presyncope or syncope.  He has not fallen.  He has not had any lower extremity edema.  He denies orthopnea or PND.  He is not ambulating much, but denies shortness of breath with exertion.   Past Medical History:  Diagnosis Date  . Anemia   . Coronary artery disease   . GERD (gastroesophageal reflux disease)   . Hypertension   . Pulmonary fibrosis ([place])   . Schizo affective schizophrenia [facility])     Past Surgical History:  Procedure Laterality Date  . CORONARY ARTERY BYPASS GRAFT      Current Outpatient Medications  Medication Sig Dispense Refill  . acetaminophen (TYLENOL) 325 MG tablet Take 2 tablets (650 mg total) by mouth every  6 (six) hours as needed for mild pain (or Fever >/= 101). (Patient taking differently: Take 325-650 mg by mouth at bedtime. )    . albuterol (VENTOLIN HFA) 108 (90 Base) MCG/ACT inhaler Inhale 2 puffs into the lungs every 6 (six) hours as needed for wheezing or shortness of breath. (Patient taking differently: Inhale 2 puffs into the lungs at bedtime. ) 18 g 1  . amiodarone (PACERONE) 200 MG tablet Take 1 tablet (200 mg total) by mouth daily.    Marland Kitchen aspirin EC 81 MG EC tablet Take 1 tablet (81 mg total) by mouth daily. 90 tablet 1  . atorvastatin (LIPITOR) 20 MG tablet Take 20 mg by mouth at bedtime.    . Calcium Carbonate-Vitamin D (CALCIUM 600/VITAMIN D) 600-400 MG-UNIT chew tablet Chew 1 tablet by mouth 2 (two) times daily.    . carvedilol (COREG) 3.125 MG tablet Take 1 tablet (3.125 mg total) by mouth 2 (two) times daily with a meal. 60 tablet 0  . clonazePAM (KLONOPIN) 0.5 MG tablet Take 1 tablet (0.5 mg total) by mouth 2 (two) times daily. 10 tablet 0  . feeding supplement, ENSURE ENLIVE, (ENSURE ENLIVE) LIQD Take 237 mLs by mouth 2 (two) times daily between meals. 237 mL 12  . ferrous sulfate 325 (65 FE) MG tablet Take 1 tablet (325 mg  total) by mouth 2 (two) times daily with a meal. 180 tablet 1  . fluticasone furoate-vilanterol (BREO ELLIPTA) 200-25 MCG/INH AEPB Inhale 1 puff into the lungs daily. 1 each 1  . furosemide (LASIX) 40 MG tablet     . ipratropium (ATROVENT) 0.03 % nasal spray Place 1 spray into the nose daily as needed for congestion.    Marland Kitchen lisinopril (ZESTRIL) 10 MG tablet     . Multiple Vitamin (MULTIVITAMIN WITH MINERALS) TABS tablet Take 1 tablet by mouth daily.    . paliperidone (INVEGA SUSTENNA) 156 MG/ML SUSY injection Inject 156 mg into the muscle every 30 (thirty) days.    . pantoprazole (PROTONIX) 40 MG tablet Take 1 tablet (40 mg total) by mouth daily. 30 tablet 0  . polyethylene glycol powder (MIRALAX) 17 GM/SCOOP powder Take 17 g by mouth 2 (two) times daily as needed  for moderate constipation. 255 g 0  . QUEtiapine (SEROQUEL) 25 MG tablet Take 1 tablet (25 mg total) by mouth at bedtime.    . tamsulosin (FLOMAX) 0.4 MG CAPS capsule Take 1 capsule (0.4 mg total) by mouth daily after supper. (Patient taking differently: Take 0.4 mg by mouth daily. ) 30 capsule 0  . umeclidinium bromide (INCRUSE ELLIPTA) 62.5 MCG/INH AEPB Inhale 1 puff into the lungs daily. 1 each 1  . vitamin B-12 (CYANOCOBALAMIN) 500 MCG tablet Take 500 mcg by mouth 2 (two) times daily.     No current facility-administered medications for this visit.    Allergies:   Patient has no known allergies.    Social History:  The patient  reports that he has quit smoking. His smoking use included cigarettes. He has never used smokeless tobacco. He reports previous alcohol use. He reports previous drug use.   Family History:  The patient's family history includes Diabetes Mellitus II in his maternal grandmother and mother; Heart disease in his maternal grandmother.  He indicated that the status of his mother is unknown. He indicated that the status of his maternal grandmother is unknown.  ROS:  Please see the history of present illness. All other systems are reviewed and negative.   PHYSICAL EXAM: VS:  BP 132/82   Pulse 61   Ht 5\' 8"  (1.727 m)   Wt 138 lb (62.6 kg)   SpO2 99%   BMI 20.98 kg/m  , BMI Body mass index is 20.98 kg/m. GEN: Well nourished, well developed, male in no acute distress HEENT: normal for age  Neck: Minimal JVD, no carotid bruit, no masses Cardiac: RRR; no murmur, no rubs, or gallops Respiratory: Rales bases bilaterally, normal work of breathing GI: soft, nontender, nondistended, + BS MS: no deformity or atrophy; no edema; distal pulses are 1-2+ in all 4 extremities  Skin: warm and dry, no rash Neuro:  Strength and sensation are intact Psych: euthymic mood, full affect  EKG:  EKG is not ordered today.   ECHO: 11/14/2019 1. Left ventricular ejection fraction,  by estimation, is 60 to 65%. The left ventricle has normal function. The left ventricle has no regional wall motion abnormalities. There is mild concentric left ventricular hypertrophy. Left ventricular diastolic parameters are consistent with Grade II diastolic dysfunction (pseudonormalization). Elevated left atrial pressure.  2. Right ventricular systolic function is normal. The right ventricular size is normal. There is normal pulmonary artery systolic pressure.  3. Left atrial size was mildly dilated.  4. The mitral valve is normal in structure. Mild mitral valve regurgitation. No evidence of mitral stenosis.  5.  The aortic valve is normal in structure. Aortic valve regurgitation is not visualized. No aortic stenosis is present.  6. The inferior vena cava is normal in size with greater than 50% respiratory variability, suggesting right atrial pressure of 3 mmHg.  Recent Labs: 10/28/2019: TSH 4.766 11/19/2019: Magnesium 1.9 11/26/2019: B Natriuretic Peptide 259.2 11/27/2019: ALT 26 11/28/2019: BUN 30; Creatinine, Ser 2.01; Hemoglobin 8.4; Platelets 166; Potassium 3.8; Sodium 137  CBC    Component Value Date/Time   WBC 5.9 11/28/2019 0339   RBC 2.79 (L) 11/28/2019 0339   HGB 8.4 (L) 11/28/2019 0339   HCT 26.0 (L) 11/28/2019 0339   PLT 166 11/28/2019 0339   MCV 93.2 11/28/2019 0339   MCH 30.1 11/28/2019 0339   MCHC 32.3 11/28/2019 0339   RDW 15.5 11/28/2019 0339   LYMPHSABS 1.4 11/27/2019 0350   MONOABS 0.9 11/27/2019 0350   EOSABS 0.1 11/27/2019 0350   BASOSABS 0.0 11/27/2019 0350   CMP Latest Ref Rng & Units 11/28/2019 11/27/2019 11/26/2019  Glucose 70 - 99 mg/dL 180(H) 92 133(H)  BUN 8 - 23 mg/dL 30(H) 33(H) 34(H)  Creatinine 0.61 - 1.24 mg/dL 2.01(H) 2.23(H) 2.29(H)  Sodium 135 - 145 mmol/L 137 140 141  Potassium 3.5 - 5.1 mmol/L 3.8 3.6 3.9  Chloride 98 - 111 mmol/L 107 112(H) 110  CO2 22 - 32 mmol/L 19(L) 20(L) 20(L)  Calcium 8.9 - 10.3 mg/dL 7.7(L) 7.6(L) 7.8(L)  Total  Protein 6.5 - 8.1 g/dL - 5.3(L) 5.3(L)  Total Bilirubin 0.3 - 1.2 mg/dL - 1.0 1.1  Alkaline Phos 38 - 126 U/L - 67 66  AST 15 - 41 U/L - 20 22  ALT 0 - 44 U/L - 26 25    Lipid Panel Lab Results  Component Value Date   CHOL 85 09/21/2019   HDL 33 (L) 09/21/2019   LDLCALC 41 09/21/2019   TRIG 56 09/21/2019   CHOLHDL 2.6 09/21/2019      Wt Readings from Last 3 Encounters:  01/09/20 138 lb (62.6 kg)  11/13/19 153 lb (69.4 kg)  10/28/19 153 lb (69.4 kg)     Other studies Reviewed: Additional studies/ records that were reviewed today include: Office notes, hospital records and testing.  ASSESSMENT AND PLAN:  1.  Chronic diastolic CHF -His volume status is good by exam -He has lost 15 pounds in 2 months, he may be too dry -Check a BMET today, medication changes based on this  2.  Poor p.o. intake, weight loss -If his renal function is stable, then his p.o. intake is adequate -He is supposed to be on Ensure twice daily -Continue this, advised him he should not lose any more weight  3.  PAF: -By exam, he is in sinus rhythm now. -he is asymptomatic and does not think he has had any A. fib since leaving the hospital. -Continue amiodarone and low-dose carvedilol -CHA2DS2-VASc equals 5 (age x 2, HTN, CHF, CAD) -However, the decision has been made not anticoagulate him due to fall risk and anemia  4.  Hypertension: -His blood pressure records are included in the paperwork from [facility] -SBP range is 106-178, but most of the readings are less than 130 -No med changes  5.  CAD: -He is not having any ischemic symptoms. -His heart rate and blood pressure are pretty well controlled. -Continue aspirin 81 mg a day, statin, and beta-blocker  6.  CKD IV: -His creatinine was approximately 2 on discharge from the hospital on 5/28.  This is his  baseline -Peak creatinine was 3.46 on 09/19/2019, when he was hospitalized with respiratory failure and CHF exacerbation -Recheck  today  Current medicines are reviewed at length with the patient today.  The patient does not have concerns regarding medicines.  The following changes have been made:  no change  Labs/ tests ordered today include:   Orders Placed This Encounter  Procedures  . Basic metabolic panel     Disposition:   FU with Sanda Klein, MD  Signed, Rosaria Ferries, PA-C  01/09/2020 12:14 PM    [facility] Phone: [phone]; Fax: [phone]

## 2020-01-09 ENCOUNTER — Other Ambulatory Visit: Payer: Self-pay

## 2020-01-09 ENCOUNTER — Ambulatory Visit (INDEPENDENT_AMBULATORY_CARE_PROVIDER_SITE_OTHER): Payer: Medicare Other | Admitting: Physician Assistant

## 2020-01-09 ENCOUNTER — Encounter: Payer: Self-pay | Admitting: Physician Assistant

## 2020-01-09 VITALS — BP 132/82 | HR 61 | Ht 68.0 in | Wt 138.0 lb

## 2020-01-09 DIAGNOSIS — I2510 Atherosclerotic heart disease of native coronary artery without angina pectoris: Secondary | ICD-10-CM | POA: Diagnosis not present

## 2020-01-09 DIAGNOSIS — I5030 Unspecified diastolic (congestive) heart failure: Secondary | ICD-10-CM

## 2020-01-09 DIAGNOSIS — I10 Essential (primary) hypertension: Secondary | ICD-10-CM

## 2020-01-09 DIAGNOSIS — I480 Paroxysmal atrial fibrillation: Secondary | ICD-10-CM | POA: Diagnosis not present

## 2020-01-09 DIAGNOSIS — N184 Chronic kidney disease, stage 4 (severe): Secondary | ICD-10-CM

## 2020-01-09 LAB — BASIC METABOLIC PANEL
BUN/Creatinine Ratio: 9 — ABNORMAL LOW (ref 10–24)
BUN: 19 mg/dL (ref 8–27)
CO2: 25 mmol/L (ref 20–29)
Calcium: 8.7 mg/dL (ref 8.6–10.2)
Chloride: 103 mmol/L (ref 96–106)
Creatinine, Ser: 2.03 mg/dL — ABNORMAL HIGH (ref 0.76–1.27)
GFR calc Af Amer: 35 mL/min/{1.73_m2} — ABNORMAL LOW (ref >59)
GFR calc non Af Amer: 31 mL/min/{1.73_m2} — ABNORMAL LOW (ref >59)
Glucose: 128 mg/dL — ABNORMAL HIGH (ref 65–99)
Potassium: 5.1 mmol/L (ref 3.5–5.2)
Sodium: 138 mmol/L (ref 134–144)

## 2020-01-09 NOTE — Patient Instructions (Signed)
Medication Instructions:  No changes  *If you need a refill on your cardiac medications before your next appointment, please call your pharmacy*   Lab Work: [facility] If you have labs (blood work) drawn today and your tests are completely normal, you will receive your results only by: Marland Kitchen MyChart Message (if you have MyChart) OR . A paper copy in the mail If you have any lab test that is abnormal or we need to change your treatment, we will call you to review the results.   Testing/Procedures: None   Follow-Up: At [facility], you and your health needs are our priority.  As part of our continuing mission to provide you with exceptional heart care, we have created designated Provider Care Teams.  These Care Teams include your primary Cardiologist (physician) and Advanced Practice Providers (APPs -  Physician Assistants and Nurse Practitioners) who all work together to provide you with the care you need, when you need it.  We recommend signing up for the patient portal called "MyChart".  Sign up information is provided on this After Visit Summary.  MyChart is used to connect with patients for Virtual Visits (Telemedicine).  Patients are able to view lab/test results, encounter notes, upcoming appointments, etc.  Non-urgent messages can be sent to your provider as well.   To learn more about what you can do with MyChart, go to [email or web address].    Your next appointment:   3 month(s)  The format for your next appointment:   In Person  Provider:   Sanda Klein, MD   Other Instructions Consider Nutritional Supplement

## 2020-01-11 NOTE — Progress Notes (Signed)
Please let him know that his labs are ok, except that his K+ is a little high. It has increased since starting the lisinopril, please stop that med. Please ask Robert Little to fax over BP/weight records in 2-3 weeks, to make sure his weight is not decreasing more and his BP is ok. Thanks

## 2020-01-11 NOTE — Progress Notes (Signed)
TY. Agree w response to his labs.

## 2020-01-15 ENCOUNTER — Telehealth: Payer: Self-pay

## 2020-01-15 NOTE — Telephone Encounter (Addendum)
Tried calling the SNF that the patient is currently and was placed on hold for 20+ minutes. When I called back I was told that it was shift change and that the nurse will have to call back.  ----- Message from Lonn [place], PA-C sent at 01/11/2020 12:03 PM EDT ----- Please let him know that his labs are ok, except that his K+ is a little high. It has increased since starting the lisinopril, please stop that med. Please ask Isaias Cowman to fax over BP/weight records in 2-3 weeks, to make sure his weight is not decreasing more and his BP is ok. Thanks

## 2020-01-18 NOTE — Progress Notes (Signed)
Spoke with Buffy J., LPN the nurse supervisor for Fri, Sat and Sun. I informed her the Robert Ferries, PA-C recently saw the patient in our office and his lab results were back. Per Robert Little the patient's  K+ is a little high. It has increased since starting the lisinopril, please stop that med. Please ask Robert Little to fax over BP/weight records in 2-3 weeks, to make sure his weight is not decreasing more and his BP is ok. She stated that the patient is not on lisinopril and if she needed to wait to about the orders. I asked if we could review his medication list. Buffy stated that she does not have lasix or carvedilol on the patient's current MAR. I informed Buffy that I will inform Robert Little of this information and proceed from there. I told that I will give her or the supervisor on duty a call back on Monday with updated orders if any.

## 2020-01-23 ENCOUNTER — Emergency Department (HOSPITAL_COMMUNITY): Payer: Medicare Other

## 2020-01-23 ENCOUNTER — Encounter (HOSPITAL_COMMUNITY): Payer: Self-pay

## 2020-01-23 ENCOUNTER — Other Ambulatory Visit: Payer: Self-pay

## 2020-01-23 ENCOUNTER — Inpatient Hospital Stay (HOSPITAL_COMMUNITY)
Admission: EM | Admit: 2020-01-23 | Discharge: 2020-01-31 | DRG: 189 | Disposition: A | Discharge status: Skilled Nursing Facility | Payer: Medicare Other | Source: Skilled Nursing Facility | Attending: Internal Medicine | Admitting: Internal Medicine

## 2020-01-23 DIAGNOSIS — J189 Pneumonia, unspecified organism: Secondary | ICD-10-CM

## 2020-01-23 DIAGNOSIS — Z951 Presence of aortocoronary bypass graft: Secondary | ICD-10-CM

## 2020-01-23 DIAGNOSIS — Z8249 Family history of ischemic heart disease and other diseases of the circulatory system: Secondary | ICD-10-CM

## 2020-01-23 DIAGNOSIS — L89311 Pressure ulcer of right buttock, stage 1: Secondary | ICD-10-CM | POA: Diagnosis present

## 2020-01-23 DIAGNOSIS — R778 Other specified abnormalities of plasma proteins: Secondary | ICD-10-CM

## 2020-01-23 DIAGNOSIS — J8410 Pulmonary fibrosis, unspecified: Secondary | ICD-10-CM | POA: Diagnosis present

## 2020-01-23 DIAGNOSIS — N184 Chronic kidney disease, stage 4 (severe): Secondary | ICD-10-CM | POA: Diagnosis present

## 2020-01-23 DIAGNOSIS — I130 Hypertensive heart and chronic kidney disease with heart failure and stage 1 through stage 4 chronic kidney disease, or unspecified chronic kidney disease: Secondary | ICD-10-CM | POA: Diagnosis present

## 2020-01-23 DIAGNOSIS — R7989 Other specified abnormal findings of blood chemistry: Secondary | ICD-10-CM

## 2020-01-23 DIAGNOSIS — I10 Essential (primary) hypertension: Secondary | ICD-10-CM | POA: Diagnosis present

## 2020-01-23 DIAGNOSIS — I248 Other forms of acute ischemic heart disease: Secondary | ICD-10-CM | POA: Diagnosis present

## 2020-01-23 DIAGNOSIS — Z7951 Long term (current) use of inhaled steroids: Secondary | ICD-10-CM

## 2020-01-23 DIAGNOSIS — E785 Hyperlipidemia, unspecified: Secondary | ICD-10-CM | POA: Diagnosis present

## 2020-01-23 DIAGNOSIS — E440 Moderate protein-calorie malnutrition: Secondary | ICD-10-CM | POA: Insufficient documentation

## 2020-01-23 DIAGNOSIS — D631 Anemia in chronic kidney disease: Secondary | ICD-10-CM | POA: Diagnosis present

## 2020-01-23 DIAGNOSIS — A419 Sepsis, unspecified organism: Secondary | ICD-10-CM | POA: Diagnosis present

## 2020-01-23 DIAGNOSIS — Z8616 Personal history of COVID-19: Secondary | ICD-10-CM

## 2020-01-23 DIAGNOSIS — F259 Schizoaffective disorder, unspecified: Secondary | ICD-10-CM | POA: Diagnosis present

## 2020-01-23 DIAGNOSIS — E1122 Type 2 diabetes mellitus with diabetic chronic kidney disease: Secondary | ICD-10-CM | POA: Diagnosis present

## 2020-01-23 DIAGNOSIS — E611 Iron deficiency: Secondary | ICD-10-CM | POA: Diagnosis present

## 2020-01-23 DIAGNOSIS — R0600 Dyspnea, unspecified: Secondary | ICD-10-CM

## 2020-01-23 DIAGNOSIS — Z79899 Other long term (current) drug therapy: Secondary | ICD-10-CM

## 2020-01-23 DIAGNOSIS — I480 Paroxysmal atrial fibrillation: Secondary | ICD-10-CM

## 2020-01-23 DIAGNOSIS — N171 Acute kidney failure with acute cortical necrosis: Secondary | ICD-10-CM | POA: Diagnosis present

## 2020-01-23 DIAGNOSIS — E1165 Type 2 diabetes mellitus with hyperglycemia: Secondary | ICD-10-CM | POA: Diagnosis not present

## 2020-01-23 DIAGNOSIS — R1310 Dysphagia, unspecified: Secondary | ICD-10-CM | POA: Diagnosis present

## 2020-01-23 DIAGNOSIS — Z7982 Long term (current) use of aspirin: Secondary | ICD-10-CM

## 2020-01-23 DIAGNOSIS — T380X5A Adverse effect of glucocorticoids and synthetic analogues, initial encounter: Secondary | ICD-10-CM | POA: Diagnosis not present

## 2020-01-23 DIAGNOSIS — R0602 Shortness of breath: Secondary | ICD-10-CM | POA: Diagnosis not present

## 2020-01-23 DIAGNOSIS — Z833 Family history of diabetes mellitus: Secondary | ICD-10-CM

## 2020-01-23 DIAGNOSIS — J9621 Acute and chronic respiratory failure with hypoxia: Principal | ICD-10-CM

## 2020-01-23 DIAGNOSIS — L89321 Pressure ulcer of left buttock, stage 1: Secondary | ICD-10-CM | POA: Diagnosis present

## 2020-01-23 DIAGNOSIS — N400 Enlarged prostate without lower urinary tract symptoms: Secondary | ICD-10-CM | POA: Diagnosis present

## 2020-01-23 DIAGNOSIS — K219 Gastro-esophageal reflux disease without esophagitis: Secondary | ICD-10-CM | POA: Diagnosis present

## 2020-01-23 DIAGNOSIS — Z87891 Personal history of nicotine dependence: Secondary | ICD-10-CM

## 2020-01-23 DIAGNOSIS — J84112 Idiopathic pulmonary fibrosis: Secondary | ICD-10-CM | POA: Diagnosis present

## 2020-01-23 DIAGNOSIS — I2510 Atherosclerotic heart disease of native coronary artery without angina pectoris: Secondary | ICD-10-CM | POA: Diagnosis present

## 2020-01-23 DIAGNOSIS — T462X5A Adverse effect of other antidysrhythmic drugs, initial encounter: Secondary | ICD-10-CM | POA: Diagnosis present

## 2020-01-23 DIAGNOSIS — L8990 Pressure ulcer of unspecified site, unspecified stage: Secondary | ICD-10-CM | POA: Insufficient documentation

## 2020-01-23 DIAGNOSIS — I214 Non-ST elevation (NSTEMI) myocardial infarction: Secondary | ICD-10-CM | POA: Diagnosis present

## 2020-01-23 DIAGNOSIS — J439 Emphysema, unspecified: Secondary | ICD-10-CM | POA: Diagnosis present

## 2020-01-23 DIAGNOSIS — I5033 Acute on chronic diastolic (congestive) heart failure: Secondary | ICD-10-CM | POA: Diagnosis present

## 2020-01-23 DIAGNOSIS — Z9981 Dependence on supplemental oxygen: Secondary | ICD-10-CM

## 2020-01-23 DIAGNOSIS — Y95 Nosocomial condition: Secondary | ICD-10-CM | POA: Diagnosis present

## 2020-01-23 DIAGNOSIS — Z6820 Body mass index (BMI) 20.0-20.9, adult: Secondary | ICD-10-CM

## 2020-01-23 HISTORY — DX: Tobacco use: Z72.0

## 2020-01-23 HISTORY — DX: Solitary pulmonary nodule: R91.1

## 2020-01-23 HISTORY — DX: Type 2 diabetes mellitus without complications: E11.9

## 2020-01-23 HISTORY — DX: Chronic respiratory failure, unspecified whether with hypoxia or hypercapnia: J96.10

## 2020-01-23 HISTORY — DX: Dysphagia, unspecified: R13.10

## 2020-01-23 HISTORY — DX: Chronic diastolic (congestive) heart failure: I50.32

## 2020-01-23 HISTORY — DX: Chronic kidney disease, stage 4 (severe): N18.4

## 2020-01-23 LAB — I-STAT ARTERIAL BLOOD GAS, ED
Acid-Base Excess: 0 mmol/L (ref 0.0–2.0)
Bicarbonate: 24.7 mmol/L (ref 20.0–28.0)
Calcium, Ion: 1.16 mmol/L (ref 1.15–1.40)
HCT: 28 % — ABNORMAL LOW (ref 39.0–52.0)
Hemoglobin: 9.5 g/dL — ABNORMAL LOW (ref 13.0–17.0)
O2 Saturation: 97 %
Patient temperature: 97.6
Potassium: 4.4 mmol/L (ref 3.5–5.1)
Sodium: 140 mmol/L (ref 135–145)
TCO2: 26 mmol/L (ref 22–32)
pCO2 arterial: 37.2 mmHg (ref 32.0–48.0)
pH, Arterial: 7.428 (ref 7.350–7.450)
pO2, Arterial: 86 mmHg (ref 83.0–108.0)

## 2020-01-23 NOTE — ED Provider Notes (Signed)
[facility] EMERGENCY DEPARTMENT Provider Note   CSN: [id number] Arrival date & time: 01/23/20  2232     History Chief Complaint  Patient presents with  . Pneumonia  . Shortness of Breath  . Hypoxia    Robert Little is a 79 y.o. male.  HPI      Robert Little is a 79 y.o. male, with a history of HTN, CAD, GERD, pulmonary fibrosis, anemia, schizoaffective schizophrenia, chronic diastolic CHF, presenting to the ED with shortness of breath worsening over the last 2-3 days.  Though the shortness of breath has been worsening over the last 2 to 3 days, he states it got much worse today while ambulating with the walker.  Also notes worsening cough over the last 2 days. Patient is at [facility].  He states he has been there over the last 2 to 3 weeks for continued treatment of pneumonia. Per the medication sheet that arrived with the patient, patient has been receiving oral doxycycline, IM ceftriaxone, and looks to have been started on oral azithromycin yesterday. Patient has been on 2 L supplemental O2 at the facility. Denies known fever, nausea/vomiting, chest pain, abdominal pain, dizziness, syncope, lower extremity edema/pain, or any other complaints.  Past Medical History:  Diagnosis Date  . Anemia   . Coronary artery disease   . GERD (gastroesophageal reflux disease)   . Hypertension   . Pulmonary fibrosis ([place])   . Schizo affective schizophrenia [facility])     Patient Active Problem List   Diagnosis Date Noted  . Sepsis due to COVID-19 ([place]) 11/12/2019  . Hypoglycemia 10/28/2019  . Hyponatremia 10/28/2019  . Acute respiratory failure with hypoxia ([place]) 09/17/2019  . Positive D dimer 09/17/2019  . Acute on chronic diastolic CHF (congestive heart failure) ([place]) 09/17/2019  . Chronic diastolic CHF (congestive heart failure), NYHA class 1 ([place]) 09/17/2019  . Acute hypoxemic respiratory failure ([place]) 09/17/2019  . Syncope 09/05/2019  . Hypocalcemia  09/05/2019  . Hypomagnesemia 09/05/2019  . CKD (chronic kidney disease), stage IV ([place]) 09/05/2019  . Acute urinary retention 05/18/2018  . UTI (urinary tract infection) 05/15/2018  . Sepsis ([place]) 05/15/2018  . Hypokalemia 05/15/2018  . AKI (acute kidney injury) ([place]) 05/15/2018  . HLD (hyperlipidemia) 05/15/2018  . Coronary artery disease involving native coronary artery of native heart without angina pectoris   . Essential hypertension   . Pulmonary fibrosis ([place])   . Schizo affective schizophrenia ([place])   . Normocytic anemia     Past Surgical History:  Procedure Laterality Date  . CORONARY ARTERY BYPASS GRAFT         Family History  Problem Relation Age of Onset  . Diabetes Mellitus II Mother   . Diabetes Mellitus II Maternal Grandmother   . Heart disease Maternal Grandmother     Social History   Tobacco Use  . Smoking status: Former Smoker    Types: Cigarettes  . Smokeless tobacco: Never Used  Vaping Use  . Vaping Use: Never used  Substance Use Topics  . Alcohol use: Not Currently  . Drug use: Not Currently    Home Medications Prior to Admission medications   Medication Sig Start Date End Date Taking? Authorizing Provider  acetaminophen (TYLENOL) 325 MG tablet Take 2 tablets (650 mg total) by mouth every 6 (six) hours as needed for mild pain (or Fever >/= 101). Patient taking differently: Take 650 mg by mouth every 6 (six) hours as needed for mild pain.  05/18/18  Yes Rizwan,  Saima, MD  albuterol (VENTOLIN HFA) 108 (90 Base) MCG/ACT inhaler Inhale 2 puffs into the lungs every 6 (six) hours as needed for wheezing or shortness of breath. 09/21/19  Yes Mercy Riding, MD  amiodarone (PACERONE) 100 MG tablet Take 100 mg by mouth daily.   Yes [provider]  aspirin EC 81 MG EC tablet Take 1 tablet (81 mg total) by mouth daily. 09/22/19  Yes Mercy Riding, MD  atorvastatin (LIPITOR) 20 MG tablet Take 20 mg by mouth at bedtime.   Yes [provider]    cefTRIAXone (ROCEPHIN) IVPB Inject 1 g into the vein daily.   Yes [provider]  clonazePAM (KLONOPIN) 0.5 MG tablet Take 1 tablet (0.5 mg total) by mouth 2 (two) times daily. 11/30/19  Yes Thurnell Lose, MD  doxycycline (VIBRAMYCIN) 100 MG capsule Take 100 mg by mouth 2 (two) times daily.   Yes [provider]  Emollient (EUCERIN SKIN CALMING) CREA Apply topically in the morning and at bedtime. To legs, arms and back   Yes [provider]  ferrous sulfate 325 (65 FE) MG tablet Take 1 tablet (325 mg total) by mouth 2 (two) times daily with a meal. 09/21/19  Yes Gonfa, Taye T, MD  fluticasone furoate-vilanterol (BREO ELLIPTA) 200-25 MCG/INH AEPB Inhale 1 puff into the lungs daily. 09/22/19  Yes Mercy Riding, MD  guaiFENesin (MUCINEX) 600 MG 12 hr tablet Take 600 mg by mouth 2 (two) times daily.   Yes [provider]  ipratropium (ATROVENT) 0.03 % nasal spray Place 1 spray into the nose daily as needed for congestion. 11/02/19  Yes [provider]  ipratropium-albuterol (DUONEB) 0.5-2.5 (3) MG/3ML SOLN Take 3 mLs by nebulization every 8 (eight) hours.   Yes [provider]  magnesium oxide (MAG-OX) 400 MG tablet Take 400 mg by mouth in the morning, at noon, and at bedtime.   Yes [provider]  Multiple Vitamin (MULTIVITAMIN WITH MINERALS) TABS tablet Take 1 tablet by mouth daily.   Yes [provider]  pantoprazole (PROTONIX) 40 MG tablet Take 1 tablet (40 mg total) by mouth daily. 11/17/19  Yes Thurnell Lose, MD  polyethylene glycol powder (MIRALAX) 17 GM/SCOOP powder Take 17 g by mouth 2 (two) times daily as needed for moderate constipation. 09/21/19  Yes Mercy Riding, MD  QUEtiapine (SEROQUEL) 25 MG tablet Take 1 tablet (25 mg total) by mouth at bedtime. 11/30/19  Yes Thurnell Lose, MD  tamsulosin (FLOMAX) 0.4 MG CAPS capsule Take 1 capsule (0.4 mg total) by mouth daily after supper. Patient taking differently: Take  0.8 mg by mouth daily after supper.  05/18/18  Yes Debbe Odea, MD  umeclidinium bromide (INCRUSE ELLIPTA) 62.5 MCG/INH AEPB Inhale 1 puff into the lungs daily. 09/22/19  Yes Mercy Riding, MD  vitamin B-12 (CYANOCOBALAMIN) 500 MCG tablet Take 500 mcg by mouth 2 (two) times daily.   Yes [provider]  amiodarone (PACERONE) 200 MG tablet Take 1 tablet (200 mg total) by mouth daily. Patient not taking: Reported on 01/23/2020 12/01/19   Thurnell Lose, MD  carvedilol (COREG) 3.125 MG tablet Take 1 tablet (3.125 mg total) by mouth 2 (two) times daily with a meal. Patient not taking: Reported on 01/18/2020 11/30/19   Thurnell Lose, MD  feeding supplement, ENSURE ENLIVE, (ENSURE ENLIVE) LIQD Take 237 mLs by mouth 2 (two) times daily between meals. Patient not taking: Reported on 01/23/2020 11/30/19   Lala Lund  K, MD    Allergies    Patient has no known allergies.  Review of Systems   Review of Systems  Constitutional: Negative for chills and fever.  Respiratory: Positive for cough and shortness of breath.   Cardiovascular: Negative for chest pain and leg swelling.  Gastrointestinal: Positive for diarrhea. Negative for abdominal pain, nausea and vomiting.  Neurological: Negative for syncope.  All other systems reviewed and are negative.   Physical Exam Updated Vital Signs BP (!) 131/56 (BP Location: Right Arm)   Pulse 91   Temp 97.6 F (36.4 C) (Oral)   Resp (!) 35   Ht 5\' 8"  (1.727 m)   Wt 62.6 kg   SpO2 100%   BMI 20.98 kg/m   Physical Exam Vitals and nursing note reviewed.  Constitutional:      General: He is not in acute distress.    Appearance: He is well-developed. He is not diaphoretic.  HENT:     Head: Normocephalic and atraumatic.     Mouth/Throat:     Mouth: Mucous membranes are moist.     Pharynx: Oropharynx is clear.     Comments: Patient's lack of dentition makes him much more difficult to understand. Eyes:     Conjunctiva/sclera: Conjunctivae  normal.  Cardiovascular:     Rate and Rhythm: Normal rate and regular rhythm.     Pulses: Normal pulses.          Radial pulses are 2+ on the right side and 2+ on the left side.       Posterior tibial pulses are 2+ on the right side and 2+ on the left side.     Heart sounds: Normal heart sounds.     Comments: Tactile temperature in the extremities appropriate and equal bilaterally. Pulmonary:     Effort: Tachypnea and respiratory distress present.     Breath sounds: Normal breath sounds.     Comments: Tachypnea and increased work of breathing.  Significantly worsens with any exertion, even sitting forward. SPO2 able to be maintained at 96% on 6 L supplemental O2.  With even mild exertion, SPO2 drops to 77%, even on nasal cannula. Abdominal:     Palpations: Abdomen is soft.     Tenderness: There is no abdominal tenderness. There is no guarding.  Musculoskeletal:     Cervical back: Neck supple.     Right lower leg: No edema.     Left lower leg: No edema.  Lymphadenopathy:     Cervical: No cervical adenopathy.  Skin:    General: Skin is warm and dry.  Neurological:     Mental Status: He is alert.  Psychiatric:        Mood and Affect: Mood and affect normal.        Speech: Speech normal.        Behavior: Behavior normal.     ED Results / Procedures / Treatments   Labs (all labs ordered are listed, but only abnormal results are displayed) Labs Reviewed  CBC WITH DIFFERENTIAL/PLATELET - Abnormal; Notable for the following components:      Result Value   RBC 2.97 (*)    Hemoglobin 8.2 (*)    HCT 27.8 (*)    MCHC 29.5 (*)    RDW 17.8 (*)    Lymphs Abs 0.6 (*)    Abs Immature Granulocytes 0.14 (*)    All other components within normal limits  BRAIN NATRIURETIC PEPTIDE - Abnormal; Notable for the following components:   B  Natriuretic Peptide 836.2 (*)    All other components within normal limits  COMPREHENSIVE METABOLIC PANEL - Abnormal; Notable for the following components:     Glucose, Bld 219 (*)    BUN 26 (*)    Creatinine, Ser 2.51 (*)    Albumin 3.0 (*)    AST 14 (*)    GFR calc non Af Amer 24 (*)    GFR calc Af Amer 28 (*)    All other components within normal limits  LACTIC ACID, PLASMA - Abnormal; Notable for the following components:   Lactic Acid, Venous 2.1 (*)    All other components within normal limits  I-STAT ARTERIAL BLOOD GAS, ED - Abnormal; Notable for the following components:   HCT 28.0 (*)    Hemoglobin 9.5 (*)    All other components within normal limits  CULTURE, BLOOD (ROUTINE X 2)  CULTURE, BLOOD (ROUTINE X 2)  URINE CULTURE  APTT  PROTIME-INR  URINALYSIS, ROUTINE W REFLEX MICROSCOPIC  LACTIC ACID, PLASMA  TROPONIN I (HIGH SENSITIVITY)  TROPONIN I (HIGH SENSITIVITY)    Hemoglobin  Date Value Ref Range Status  01/23/2020 8.2 (L) 13.0 - 17.0 g/dL Final  01/23/2020 9.5 (L) 13.0 - 17.0 g/dL Final  11/28/2019 8.4 (L) 13.0 - 17.0 g/dL Final  11/27/2019 9.0 (L) 13.0 - 17.0 g/dL Final    BUN  Date Value Ref Range Status  01/23/2020 26 (H) 8 - 23 mg/dL Final  01/09/2020 19 8 - 27 mg/dL Final  11/28/2019 30 (H) 8 - 23 mg/dL Final  11/27/2019 33 (H) 8 - 23 mg/dL Final  11/26/2019 34 (H) 8 - 23 mg/dL Final  10/10/2019 37 (H) 8 - 27 mg/dL Final   Creatinine, Ser  Date Value Ref Range Status  01/23/2020 2.51 (H) 0.61 - 1.24 mg/dL Final  01/09/2020 2.03 (H) 0.76 - 1.27 mg/dL Final  11/28/2019 2.01 (H) 0.61 - 1.24 mg/dL Final  11/27/2019 2.23 (H) 0.61 - 1.24 mg/dL Final     EKG EKG Interpretation  Date/Time:  Wednesday January 23 2020 22:38:25 EDT Ventricular Rate:  100 PR Interval:    QRS Duration: 93 QT Interval:  345 QTC Calculation: 445 R Axis:   11 Text Interpretation: Sinus tachycardia Repol abnrm suggests ischemia, lateral leads Otherwise no significant change Confirmed by Addison Lank [phone]) on 01/23/2020 11:07:15 PM   Radiology DG Chest Port 1 View  Result Date: 01/23/2020 CLINICAL DATA:  Dyspnea EXAM:  PORTABLE CHEST 1 VIEW COMPARISON:  11/20/2019, CT 09/19/2019 FINDINGS: Lung volumes are small, but are symmetric. Moderate mid and lower lung zone interstitial pulmonary infiltrate has developed,, significantly progressed since prior examination. Given the findings on prior CT examination, this may represent progression of the patient's underlying interstitial lung disease, or more likely, superimposed progressive pulmonary edema or atypical infection. Cavitary lesion within the right mid lung zone represents a pneumatocele noted on a prior CT exam in this region. No pneumothorax or pleural effusion. Coronary artery bypass grafting has been performed. Cardiac size is within normal limits. No acute bone abnormality. IMPRESSION: Interval development of extensive mid and lower lung zone pulmonary infiltrates superimposed upon axis ting chronic interstitial changes. Of the various considerations listed above, superimposed pulmonary edema appears most likely. Pulmonary hypoinflation. Electronically Signed   By: Fidela Salisbury MD   On: 01/23/2020 23:24    Procedures Procedures (including critical care time)  Medications Ordered in ED Medications  vancomycin (VANCOREADY) IVPB 1250 mg/250 mL (1,250 mg Intravenous New Bag/Given 01/24/20 0149)  furosemide (LASIX) injection 40 mg (has no administration in time range)  ceFEPIme (MAXIPIME) 2 g in sodium chloride 0.9 % 100 mL IVPB (2 g Intravenous New Bag/Given 01/24/20 0148)  acetaminophen (TYLENOL) tablet 650 mg (650 mg Oral Given 01/24/20 0149)    ED Course  I have reviewed the triage vital signs and the nursing notes.  Pertinent labs & imaging results that were available during my care of the patient were reviewed by me and considered in my medical decision making (see chart for details).  Clinical Course as of Jan 23 409  Thu Jan 24, 2020  0237 Spoke with Dr. Hal Hope, hospitalist. Agrees to admit the patient.    [SJ]  [id number] Patient was already  admitted, but noted troponin came back positive. Made contact with Dr. Hal Hope and made sure he was aware.    [SJ]    Clinical Course User Index [SJ] Taison Celani C, PA-C   MDM Rules/Calculators/A&P                          Patient presents with worsening shortness of breath and cough over the last couple days.  Significantly worsened shortness of breath, especially with exertion, beginning today.  Patient has been receiving treatment for pneumonia at the nursing facility for the last 2 weeks.  He has been receiving oral doxycycline and IM ceftriaxone. Despite this, he arrives febrile at 100.6 F (rectal), tachypneic, and hypoxic. I personally reviewed and interpreted the patient's labs and imaging studies. Small elevation in creatinine. Mild lactic acidosis at 2.1.  Additionally, patient has elevated BNP and chest x-ray with possible pulmonary edema.  Consideration of additional pulmonary infection causing acute exacerbation of patient's CHF.  Since patient is already on appropriate antibiotics for CAP, we will treat as if he has healthcare associated pneumonia.  Patient tested positive for Covid on Nov 12, 2019, therefore this test was not reordered.  Troponin pending at time of admission.   Findings and plan of care discussed with Addison Lank, MD. Dr. Leonette Monarch personally evaluated and examined this patient.  Vitals:   01/24/20 0003 01/24/20 0004 01/24/20 0029 01/24/20 0044  BP:  (!) 110/49 (!) 100/48   Pulse:  87 74   Resp:  (!) 30 (!) 33   Temp: (!) 100.6 F (38.1 C)     TempSrc: Rectal     SpO2:  95% 100% 100%  Weight:      Height:         Final Clinical Impression(s) / ED Diagnoses Final diagnoses:  HCAP (healthcare-associated pneumonia)    Rx / DC Orders ED Discharge Orders    None       Layla Maw 01/24/20 0411    Fatima Blank, MD 01/26/20 0246

## 2020-01-23 NOTE — ED Triage Notes (Signed)
Pt BIB [facility] from [facility] Pt at facility for Pneumonia for 2-3 weeks.  Pt was SOB while ambulating and was 80s SPO2 on regular 2-3 L.  EMS placed pt on 15L NRB    EMS VSS except for O2 HR 100 Temp 98.3 BP 117/80 O2 upper 90s on 15L NRB

## 2020-01-24 ENCOUNTER — Inpatient Hospital Stay (HOSPITAL_COMMUNITY): Payer: Medicare Other

## 2020-01-24 ENCOUNTER — Encounter (HOSPITAL_COMMUNITY): Payer: Self-pay | Admitting: Internal Medicine

## 2020-01-24 DIAGNOSIS — Y95 Nosocomial condition: Secondary | ICD-10-CM | POA: Diagnosis present

## 2020-01-24 DIAGNOSIS — Z951 Presence of aortocoronary bypass graft: Secondary | ICD-10-CM | POA: Diagnosis not present

## 2020-01-24 DIAGNOSIS — I248 Other forms of acute ischemic heart disease: Secondary | ICD-10-CM | POA: Diagnosis present

## 2020-01-24 DIAGNOSIS — N171 Acute kidney failure with acute cortical necrosis: Secondary | ICD-10-CM | POA: Diagnosis present

## 2020-01-24 DIAGNOSIS — L89321 Pressure ulcer of left buttock, stage 1: Secondary | ICD-10-CM | POA: Diagnosis present

## 2020-01-24 DIAGNOSIS — I219 Acute myocardial infarction, unspecified: Secondary | ICD-10-CM | POA: Diagnosis not present

## 2020-01-24 DIAGNOSIS — E611 Iron deficiency: Secondary | ICD-10-CM | POA: Diagnosis present

## 2020-01-24 DIAGNOSIS — Z6820 Body mass index (BMI) 20.0-20.9, adult: Secondary | ICD-10-CM | POA: Diagnosis not present

## 2020-01-24 DIAGNOSIS — R778 Other specified abnormalities of plasma proteins: Secondary | ICD-10-CM | POA: Diagnosis not present

## 2020-01-24 DIAGNOSIS — I214 Non-ST elevation (NSTEMI) myocardial infarction: Secondary | ICD-10-CM | POA: Diagnosis not present

## 2020-01-24 DIAGNOSIS — I2510 Atherosclerotic heart disease of native coronary artery without angina pectoris: Secondary | ICD-10-CM | POA: Diagnosis present

## 2020-01-24 DIAGNOSIS — L89311 Pressure ulcer of right buttock, stage 1: Secondary | ICD-10-CM | POA: Diagnosis present

## 2020-01-24 DIAGNOSIS — Z8616 Personal history of COVID-19: Secondary | ICD-10-CM | POA: Diagnosis not present

## 2020-01-24 DIAGNOSIS — J189 Pneumonia, unspecified organism: Secondary | ICD-10-CM

## 2020-01-24 DIAGNOSIS — N400 Enlarged prostate without lower urinary tract symptoms: Secondary | ICD-10-CM | POA: Diagnosis present

## 2020-01-24 DIAGNOSIS — J8410 Pulmonary fibrosis, unspecified: Secondary | ICD-10-CM | POA: Diagnosis not present

## 2020-01-24 DIAGNOSIS — T462X5A Adverse effect of other antidysrhythmic drugs, initial encounter: Secondary | ICD-10-CM | POA: Diagnosis present

## 2020-01-24 DIAGNOSIS — D631 Anemia in chronic kidney disease: Secondary | ICD-10-CM | POA: Diagnosis present

## 2020-01-24 DIAGNOSIS — M7989 Other specified soft tissue disorders: Secondary | ICD-10-CM | POA: Diagnosis not present

## 2020-01-24 DIAGNOSIS — I10 Essential (primary) hypertension: Secondary | ICD-10-CM | POA: Diagnosis not present

## 2020-01-24 DIAGNOSIS — I130 Hypertensive heart and chronic kidney disease with heart failure and stage 1 through stage 4 chronic kidney disease, or unspecified chronic kidney disease: Secondary | ICD-10-CM | POA: Diagnosis present

## 2020-01-24 DIAGNOSIS — F259 Schizoaffective disorder, unspecified: Secondary | ICD-10-CM | POA: Diagnosis present

## 2020-01-24 DIAGNOSIS — N184 Chronic kidney disease, stage 4 (severe): Secondary | ICD-10-CM | POA: Diagnosis present

## 2020-01-24 DIAGNOSIS — I340 Nonrheumatic mitral (valve) insufficiency: Secondary | ICD-10-CM

## 2020-01-24 DIAGNOSIS — J9601 Acute respiratory failure with hypoxia: Secondary | ICD-10-CM | POA: Diagnosis not present

## 2020-01-24 DIAGNOSIS — J84112 Idiopathic pulmonary fibrosis: Secondary | ICD-10-CM | POA: Diagnosis present

## 2020-01-24 DIAGNOSIS — K219 Gastro-esophageal reflux disease without esophagitis: Secondary | ICD-10-CM | POA: Diagnosis present

## 2020-01-24 DIAGNOSIS — E440 Moderate protein-calorie malnutrition: Secondary | ICD-10-CM | POA: Diagnosis present

## 2020-01-24 DIAGNOSIS — J9621 Acute and chronic respiratory failure with hypoxia: Secondary | ICD-10-CM | POA: Diagnosis present

## 2020-01-24 DIAGNOSIS — I25810 Atherosclerosis of coronary artery bypass graft(s) without angina pectoris: Secondary | ICD-10-CM | POA: Diagnosis not present

## 2020-01-24 DIAGNOSIS — I361 Nonrheumatic tricuspid (valve) insufficiency: Secondary | ICD-10-CM

## 2020-01-24 DIAGNOSIS — I5033 Acute on chronic diastolic (congestive) heart failure: Secondary | ICD-10-CM | POA: Diagnosis present

## 2020-01-24 DIAGNOSIS — R0602 Shortness of breath: Secondary | ICD-10-CM | POA: Diagnosis not present

## 2020-01-24 DIAGNOSIS — I5032 Chronic diastolic (congestive) heart failure: Secondary | ICD-10-CM | POA: Diagnosis not present

## 2020-01-24 DIAGNOSIS — E1122 Type 2 diabetes mellitus with diabetic chronic kidney disease: Secondary | ICD-10-CM | POA: Diagnosis present

## 2020-01-24 DIAGNOSIS — E785 Hyperlipidemia, unspecified: Secondary | ICD-10-CM | POA: Diagnosis present

## 2020-01-24 DIAGNOSIS — I480 Paroxysmal atrial fibrillation: Secondary | ICD-10-CM | POA: Diagnosis present

## 2020-01-24 LAB — ECHOCARDIOGRAM COMPLETE
Area-P 1/2: 4.49 cm2
Calc EF: 54.2 %
Height: 68 in
S' Lateral: 2.9 cm
Single Plane A2C EF: 32.2 %
Single Plane A4C EF: 66.3 %
Weight: 2208.13 oz

## 2020-01-24 LAB — CBC WITH DIFFERENTIAL/PLATELET
Abs Immature Granulocytes: 0.1 10*3/uL — ABNORMAL HIGH (ref 0.00–0.07)
Abs Immature Granulocytes: 0.14 10*3/uL — ABNORMAL HIGH (ref 0.00–0.07)
Basophils Absolute: 0 10*3/uL (ref 0.0–0.1)
Basophils Absolute: 0 10*3/uL (ref 0.0–0.1)
Basophils Relative: 0 %
Basophils Relative: 0 %
Eosinophils Absolute: 0 10*3/uL (ref 0.0–0.5)
Eosinophils Absolute: 0.1 10*3/uL (ref 0.0–0.5)
Eosinophils Relative: 0 %
Eosinophils Relative: 1 %
HCT: 25.2 % — ABNORMAL LOW (ref 39.0–52.0)
HCT: 27.8 % — ABNORMAL LOW (ref 39.0–52.0)
Hemoglobin: 7.3 g/dL — ABNORMAL LOW (ref 13.0–17.0)
Hemoglobin: 8.2 g/dL — ABNORMAL LOW (ref 13.0–17.0)
Immature Granulocytes: 1 %
Immature Granulocytes: 2 %
Lymphocytes Relative: 26 %
Lymphocytes Relative: 7 %
Lymphs Abs: 0.6 10*3/uL — ABNORMAL LOW (ref 0.7–4.0)
Lymphs Abs: 2.2 10*3/uL (ref 0.7–4.0)
MCH: 27.5 pg (ref 26.0–34.0)
MCH: 27.6 pg (ref 26.0–34.0)
MCHC: 29 g/dL — ABNORMAL LOW (ref 30.0–36.0)
MCHC: 29.5 g/dL — ABNORMAL LOW (ref 30.0–36.0)
MCV: 93.6 fL (ref 80.0–100.0)
MCV: 95.1 fL (ref 80.0–100.0)
Monocytes Absolute: 0.8 10*3/uL (ref 0.1–1.0)
Monocytes Absolute: 0.8 10*3/uL (ref 0.1–1.0)
Monocytes Relative: 10 %
Monocytes Relative: 9 %
Neutro Abs: 5.1 10*3/uL (ref 1.7–7.7)
Neutro Abs: 7.3 10*3/uL (ref 1.7–7.7)
Neutrophils Relative %: 62 %
Neutrophils Relative %: 82 %
Platelets: 177 10*3/uL (ref 150–400)
Platelets: 198 10*3/uL (ref 150–400)
RBC: 2.65 MIL/uL — ABNORMAL LOW (ref 4.22–5.81)
RBC: 2.97 MIL/uL — ABNORMAL LOW (ref 4.22–5.81)
RDW: 17.8 % — ABNORMAL HIGH (ref 11.5–15.5)
RDW: 18.1 % — ABNORMAL HIGH (ref 11.5–15.5)
WBC: 8.3 10*3/uL (ref 4.0–10.5)
WBC: 9 10*3/uL (ref 4.0–10.5)
nRBC: 0 % (ref 0.0–0.2)
nRBC: 0 % (ref 0.0–0.2)

## 2020-01-24 LAB — COMPREHENSIVE METABOLIC PANEL
ALT: 11 U/L (ref 0–44)
ALT: 9 U/L (ref 0–44)
AST: 14 U/L — ABNORMAL LOW (ref 15–41)
AST: 15 U/L (ref 15–41)
Albumin: 2.5 g/dL — ABNORMAL LOW (ref 3.5–5.0)
Albumin: 3 g/dL — ABNORMAL LOW (ref 3.5–5.0)
Alkaline Phosphatase: 67 U/L (ref 38–126)
Alkaline Phosphatase: 86 U/L (ref 38–126)
Anion gap: 12 (ref 5–15)
Anion gap: 12 (ref 5–15)
BUN: 25 mg/dL — ABNORMAL HIGH (ref 8–23)
BUN: 26 mg/dL — ABNORMAL HIGH (ref 8–23)
CO2: 23 mmol/L (ref 22–32)
CO2: 24 mmol/L (ref 22–32)
Calcium: 8.4 mg/dL — ABNORMAL LOW (ref 8.9–10.3)
Calcium: 9 mg/dL (ref 8.9–10.3)
Chloride: 103 mmol/L (ref 98–111)
Chloride: 105 mmol/L (ref 98–111)
Creatinine, Ser: 2.47 mg/dL — ABNORMAL HIGH (ref 0.61–1.24)
Creatinine, Ser: 2.51 mg/dL — ABNORMAL HIGH (ref 0.61–1.24)
GFR calc Af Amer: 28 mL/min — ABNORMAL LOW (ref >60)
GFR calc Af Amer: 28 mL/min — ABNORMAL LOW (ref >60)
GFR calc non Af Amer: 24 mL/min — ABNORMAL LOW (ref >60)
GFR calc non Af Amer: 24 mL/min — ABNORMAL LOW (ref >60)
Glucose, Bld: 121 mg/dL — ABNORMAL HIGH (ref 70–99)
Glucose, Bld: 219 mg/dL — ABNORMAL HIGH (ref 70–99)
Potassium: 4.5 mmol/L (ref 3.5–5.1)
Potassium: 4.8 mmol/L (ref 3.5–5.1)
Sodium: 139 mmol/L (ref 135–145)
Sodium: 140 mmol/L (ref 135–145)
Total Bilirubin: 0.7 mg/dL (ref 0.3–1.2)
Total Bilirubin: 1 mg/dL (ref 0.3–1.2)
Total Protein: 6 g/dL — ABNORMAL LOW (ref 6.5–8.1)
Total Protein: 7.7 g/dL (ref 6.5–8.1)

## 2020-01-24 LAB — CK: Total CK: 129 U/L (ref 49–397)

## 2020-01-24 LAB — URINE CULTURE: Culture: NO GROWTH

## 2020-01-24 LAB — HIV ANTIBODY (ROUTINE TESTING W REFLEX): HIV Screen 4th Generation wRfx: NONREACTIVE

## 2020-01-24 LAB — TROPONIN I (HIGH SENSITIVITY)
Troponin I (High Sensitivity): 956 ng/L (ref <18)
Troponin I (High Sensitivity): 1154 ng/L (ref <18)
Troponin I (High Sensitivity): 1831 ng/L (ref <18)
Troponin I (High Sensitivity): 1903 ng/L (ref <18)
Troponin I (High Sensitivity): 785 ng/L (ref <18)
Troponin I (High Sensitivity): 844 ng/L (ref <18)

## 2020-01-24 LAB — PROTIME-INR
INR: 1.1 (ref 0.8–1.2)
Prothrombin Time: 14.2 seconds (ref 11.4–15.2)

## 2020-01-24 LAB — URINALYSIS, ROUTINE W REFLEX MICROSCOPIC
Bilirubin Urine: NEGATIVE
Glucose, UA: NEGATIVE mg/dL
Hgb urine dipstick: NEGATIVE
Ketones, ur: NEGATIVE mg/dL
Leukocytes,Ua: NEGATIVE
Nitrite: NEGATIVE
Protein, ur: NEGATIVE mg/dL
Specific Gravity, Urine: 1.015 (ref 1.005–1.030)
pH: 5 (ref 5.0–8.0)

## 2020-01-24 LAB — BRAIN NATRIURETIC PEPTIDE: B Natriuretic Peptide: 836.2 pg/mL — ABNORMAL HIGH (ref 0.0–100.0)

## 2020-01-24 LAB — PROCALCITONIN: Procalcitonin: <0.1 ng/mL

## 2020-01-24 LAB — SEDIMENTATION RATE: Sed Rate: 134 mm/hr — ABNORMAL HIGH (ref 0–16)

## 2020-01-24 LAB — LACTIC ACID, PLASMA
Lactic Acid, Venous: 1.8 mmol/L (ref 0.5–1.9)
Lactic Acid, Venous: 2 mmol/L (ref 0.5–1.9)
Lactic Acid, Venous: 2.1 mmol/L (ref 0.5–1.9)

## 2020-01-24 LAB — TSH: TSH: 5.133 u[IU]/mL — ABNORMAL HIGH (ref 0.350–4.500)

## 2020-01-24 LAB — STREP PNEUMONIAE URINARY ANTIGEN: Strep Pneumo Urinary Antigen: NEGATIVE

## 2020-01-24 LAB — APTT: aPTT: 29 seconds (ref 24–36)

## 2020-01-24 LAB — T4, FREE: Free T4: 0.84 ng/dL (ref 0.61–1.12)

## 2020-01-24 LAB — C-REACTIVE PROTEIN: CRP: 12.4 mg/dL — ABNORMAL HIGH (ref <1.0)

## 2020-01-24 MED ORDER — FLUTICASONE FUROATE-VILANTEROL 200-25 MCG/INH IN AEPB
1.0000 | INHALATION_SPRAY | Freq: Every day | RESPIRATORY_TRACT | Status: DC
  Administered 2020-01-25 – 2020-01-31 (×7): 1 via RESPIRATORY_TRACT
  Filled 2020-01-24: qty 28

## 2020-01-24 MED ORDER — ONDANSETRON HCL 4 MG PO TABS: 4.0000 mg | ORAL_TABLET | Freq: Four times a day (QID) | ORAL | Status: DC | PRN

## 2020-01-24 MED ORDER — SENNOSIDES-DOCUSATE SODIUM 8.6-50 MG PO TABS
2.0000 | ORAL_TABLET | Freq: Every day | ORAL | Status: DC
  Administered 2020-01-24 – 2020-01-30 (×7): 2 via ORAL
  Filled 2020-01-24 (×7): qty 2

## 2020-01-24 MED ORDER — GUAIFENESIN ER 600 MG PO TB12
600.0000 mg | ORAL_TABLET | Freq: Two times a day (BID) | ORAL | Status: DC
  Administered 2020-01-24 – 2020-01-31 (×15): 600 mg via ORAL
  Filled 2020-01-24 (×16): qty 1

## 2020-01-24 MED ORDER — AMIODARONE HCL 200 MG PO TABS
100.0000 mg | ORAL_TABLET | Freq: Every day | ORAL | Status: DC
  Administered 2020-01-24: 100 mg via ORAL
  Filled 2020-01-24: qty 1

## 2020-01-24 MED ORDER — ASPIRIN EC 81 MG PO TBEC
81.0000 mg | DELAYED_RELEASE_TABLET | Freq: Every day | ORAL | Status: DC
  Administered 2020-01-24 – 2020-01-31 (×8): 81 mg via ORAL
  Filled 2020-01-24 (×9): qty 1

## 2020-01-24 MED ORDER — IPRATROPIUM-ALBUTEROL 0.5-2.5 (3) MG/3ML IN SOLN
3.0000 mL | Freq: Three times a day (TID) | RESPIRATORY_TRACT | Status: DC
  Administered 2020-01-24 (×2): 3 mL via RESPIRATORY_TRACT
  Filled 2020-01-24 (×2): qty 3

## 2020-01-24 MED ORDER — CLONAZEPAM 0.5 MG PO TABS
0.5000 mg | ORAL_TABLET | Freq: Two times a day (BID) | ORAL | Status: DC
  Administered 2020-01-24 – 2020-01-31 (×15): 0.5 mg via ORAL
  Filled 2020-01-24 (×16): qty 1

## 2020-01-24 MED ORDER — ATORVASTATIN CALCIUM 10 MG PO TABS
20.0000 mg | ORAL_TABLET | Freq: Every day | ORAL | Status: DC
  Administered 2020-01-24 – 2020-01-30 (×7): 20 mg via ORAL
  Filled 2020-01-24 (×7): qty 2

## 2020-01-24 MED ORDER — FERROUS SULFATE 325 (65 FE) MG PO TABS
325.0000 mg | ORAL_TABLET | Freq: Two times a day (BID) | ORAL | Status: DC
  Administered 2020-01-24 – 2020-01-31 (×16): 325 mg via ORAL
  Filled 2020-01-24 (×17): qty 1

## 2020-01-24 MED ORDER — VANCOMYCIN HCL 750 MG/150ML IV SOLN: 750.0000 mg | INTRAVENOUS | Status: DC

## 2020-01-24 MED ORDER — FUROSEMIDE 10 MG/ML IJ SOLN
40.0000 mg | Freq: Once | INTRAMUSCULAR | Status: AC
  Administered 2020-01-24: 40 mg via INTRAVENOUS
  Filled 2020-01-24: qty 4

## 2020-01-24 MED ORDER — UMECLIDINIUM BROMIDE 62.5 MCG/INH IN AEPB
1.0000 | INHALATION_SPRAY | Freq: Every day | RESPIRATORY_TRACT | Status: DC
  Administered 2020-01-25 – 2020-01-31 (×7): 1 via RESPIRATORY_TRACT
  Filled 2020-01-24: qty 7

## 2020-01-24 MED ORDER — QUETIAPINE FUMARATE 25 MG PO TABS
25.0000 mg | ORAL_TABLET | Freq: Every day | ORAL | Status: DC
  Administered 2020-01-24 – 2020-01-30 (×7): 25 mg via ORAL
  Filled 2020-01-24 (×8): qty 1

## 2020-01-24 MED ORDER — ADULT MULTIVITAMIN W/MINERALS CH
1.0000 | ORAL_TABLET | Freq: Every day | ORAL | Status: DC
  Administered 2020-01-24 – 2020-01-31 (×8): 1 via ORAL
  Filled 2020-01-24 (×9): qty 1

## 2020-01-24 MED ORDER — ACETAMINOPHEN 325 MG PO TABS: 650.0000 mg | ORAL_TABLET | Freq: Four times a day (QID) | ORAL | Status: DC | PRN

## 2020-01-24 MED ORDER — PERFLUTREN LIPID MICROSPHERE
1.0000 mL | INTRAVENOUS | Status: AC | PRN
  Administered 2020-01-24: 2 mL via INTRAVENOUS
  Filled 2020-01-24: qty 10

## 2020-01-24 MED ORDER — FUROSEMIDE 10 MG/ML IJ SOLN
40.0000 mg | Freq: Two times a day (BID) | INTRAMUSCULAR | Status: AC
  Administered 2020-01-24 (×2): 40 mg via INTRAVENOUS
  Filled 2020-01-24 (×2): qty 4

## 2020-01-24 MED ORDER — SODIUM CHLORIDE 0.9 % IV SOLN: 2.0000 g | INTRAVENOUS | Status: DC

## 2020-01-24 MED ORDER — VANCOMYCIN HCL 1250 MG/250ML IV SOLN
1250.0000 mg | Freq: Once | INTRAVENOUS | Status: AC
  Administered 2020-01-24: 1250 mg via INTRAVENOUS
  Filled 2020-01-24: qty 250

## 2020-01-24 MED ORDER — ACETAMINOPHEN 325 MG PO TABS
650.0000 mg | ORAL_TABLET | Freq: Once | ORAL | Status: AC
  Administered 2020-01-24: 650 mg via ORAL
  Filled 2020-01-24: qty 2

## 2020-01-24 MED ORDER — ONDANSETRON HCL 4 MG/2ML IJ SOLN
4.0000 mg | Freq: Four times a day (QID) | INTRAMUSCULAR | Status: DC | PRN
  Administered 2020-01-29: 4 mg via INTRAVENOUS
  Filled 2020-01-24: qty 2

## 2020-01-24 MED ORDER — PANTOPRAZOLE SODIUM 40 MG PO TBEC
40.0000 mg | DELAYED_RELEASE_TABLET | Freq: Every day | ORAL | Status: DC
  Administered 2020-01-24 – 2020-01-27 (×4): 40 mg via ORAL
  Filled 2020-01-24 (×4): qty 1

## 2020-01-24 MED ORDER — SODIUM CHLORIDE 0.9 % IV SOLN
100.0000 mg | Freq: Two times a day (BID) | INTRAVENOUS | Status: DC
  Administered 2020-01-24: 100 mg via INTRAVENOUS
  Filled 2020-01-24 (×2): qty 100

## 2020-01-24 MED ORDER — HEPARIN SODIUM (PORCINE) 5000 UNIT/ML IJ SOLN
5000.0000 [IU] | Freq: Three times a day (TID) | INTRAMUSCULAR | Status: DC
  Administered 2020-01-24 – 2020-01-31 (×22): 5000 [IU] via SUBCUTANEOUS
  Filled 2020-01-24 (×21): qty 1

## 2020-01-24 MED ORDER — POLYETHYLENE GLYCOL 3350 17 G PO PACK
17.0000 g | PACK | Freq: Every day | ORAL | Status: DC | PRN
  Administered 2020-01-26: 17 g via ORAL
  Filled 2020-01-24: qty 1

## 2020-01-24 MED ORDER — ASPIRIN 81 MG PO TBEC: 81.0000 mg | DELAYED_RELEASE_TABLET | Freq: Every day | ORAL | Status: DC

## 2020-01-24 MED ORDER — ALBUTEROL SULFATE (2.5 MG/3ML) 0.083% IN NEBU: 3.0000 mL | INHALATION_SOLUTION | Freq: Four times a day (QID) | RESPIRATORY_TRACT | Status: DC | PRN

## 2020-01-24 MED ORDER — VITAMIN B-12 1000 MCG PO TABS
500.0000 ug | ORAL_TABLET | Freq: Two times a day (BID) | ORAL | Status: DC
  Administered 2020-01-24 – 2020-01-31 (×15): 500 ug via ORAL
  Filled 2020-01-24 (×16): qty 1

## 2020-01-24 MED ORDER — MAGNESIUM OXIDE 400 (241.3 MG) MG PO TABS
400.0000 mg | ORAL_TABLET | Freq: Every day | ORAL | Status: DC
  Administered 2020-01-24 – 2020-01-31 (×8): 400 mg via ORAL
  Filled 2020-01-24 (×9): qty 1

## 2020-01-24 MED ORDER — SODIUM CHLORIDE 0.9 % IV SOLN
2.0000 g | Freq: Once | INTRAVENOUS | Status: AC
  Administered 2020-01-24: 2 g via INTRAVENOUS
  Filled 2020-01-24: qty 2

## 2020-01-24 MED ORDER — TAMSULOSIN HCL 0.4 MG PO CAPS
0.8000 mg | ORAL_CAPSULE | Freq: Every day | ORAL | Status: DC
  Administered 2020-01-24 – 2020-01-31 (×8): 0.8 mg via ORAL
  Filled 2020-01-24 (×9): qty 2

## 2020-01-24 NOTE — Progress Notes (Signed)
Patient admitted to 2w22 at this time. Patient oriented to room. Bed alarm on and audible. Patient denise any needs at this time. Will continue to monitor.

## 2020-01-24 NOTE — ED Notes (Signed)
O2 titrated down to 3L SpO2 currently 99%. Patient resting comfortably.

## 2020-01-24 NOTE — Consult Note (Addendum)
Cardiology Consultation:   Patient ID: Robert Little MRN: [id number]; DOB: 09-01-1940  Admit date: 01/23/2020 Date of Consult: 01/24/2020  Primary Care Provider: Care, [name] [facility] Cardiologist: Sanda Klein, MD  [facility] Electrophysiologist:  None    Patient Profile:   Robert Little is a 79 y.o. male with a hx of CAD s/p prior CABG([facility] 02/2004), DM2, HTN, HLD, pulmonary fibrosis with chronic respiratory failure on home O2, Covid-19 PNA 11/2019, schizoaffective disorderin a group home, CKD IV, anemia of chronic disease, GERD, chronic diastolic CHF, lung nodule, extensive tobacco history, dysphagia, chronic appearing anemia who is being seen today for the evaluation of elevated troponin/CHF at the request of Dr. Reesa Chew.  History of Present Illness:    Mr. Breden was initially seen by our team in 09/2019 for dyspnea in the context of elevated Cr, BNP, and CT showing probable pulmonary fibrosis. His troponin was mildly elevated to 102 at that time, felt due to demand ischemia. He was felt to be a poor candidate for invasive procedures. He has been noted to be a poor historian. He has had numerous medical admissions. He was admitted 5/10-5/28/21 with acute hypoxic respiratory failure in the context of Covid 19 PNA, complicated by atrial fib RVR, hypotension, AKI on CKD, anemia requiring transfusion, and requirement of home O2 at discharge. He was not felt to be a good candidate for anticoagulation due to fall risk and anemia. He has been maintained on oral amiodarone.  He lives in [place] and developed worsening SOB with hypoxia on his regular 2-3L, therefore was brought to the ED. He denies any chest pain, pressure or palpitations. In the ED he did have one isolated temp of 100.6 but has otherwise been afebrile. Labs show hsTropnin [phone], BNP 836, lactic acid 2.1, TSH 5.133, Hgb 9.5-8.2-7.3 (previously 8.4 in 11/2019), Cr 2.51 (most recent range  variable (2-3), procalcitonin <0.10. CXR shows interval development of extensive mid and lower lung zone pulmonary infiltrates superimposed upon existing chronic interstitial changes, superimposed pulmonary edema appears most likely, pulmonary hypoinflation. He was started on IV Lasix 57m BID with first doses at 0254 and 1330. This afternoon he developed progressive hypoxia decreased to 75%on 6L requiring NRB to maintain 88% and above. 2D echo reveals EF 60-65%, grade 2 DD, mildly enlarged RV with normal systolic function, mild MR. BP low-normal at times, HR normal. He currently reports his SOB has improved. Denies any recent CP, edema, syncope or palpitations. He is down about 15lb from prior admission in May.  Past Medical History:  Diagnosis Date  . Anemia   . Chronic diastolic CHF (congestive heart failure) ([place]   . Chronic respiratory failure ([place]   . CKD (chronic kidney disease), stage IV ([place]   . Coronary artery disease    [facility] . COVID-19 11/2019   pneumonia  . Diabetes mellitus ([place]   . Dysphagia   . GERD (gastroesophageal reflux disease)   . Hypertension   . Lung nodule   . Pulmonary fibrosis ([place]   . Schizo affective schizophrenia ([place]   . Tobacco abuse     Past Surgical History:  Procedure Laterality Date  . CORONARY ARTERY BYPASS GRAFT       Home Medications:  Prior to Admission medications   Medication Sig Start Date End Date Taking? Authorizing Provider  acetaminophen (TYLENOL) 325 MG tablet Take 2 tablets (650 mg total) by mouth every 6 (six) hours as needed for mild pain (or Fever >/=  101). Patient taking differently: Take 650 mg by mouth every 6 (six) hours as needed for mild pain.  05/18/18  Yes Debbe Odea, MD  albuterol (VENTOLIN HFA) 108 (90 Base) MCG/ACT inhaler Inhale 2 puffs into the lungs every 6 (six) hours as needed for wheezing or shortness of breath. 09/21/19  Yes Mercy Riding, MD  amiodarone (PACERONE) 100 MG tablet Take 100 mg by  mouth daily.   Yes [provider]  aspirin EC 81 MG EC tablet Take 1 tablet (81 mg total) by mouth daily. 09/22/19  Yes Mercy Riding, MD  atorvastatin (LIPITOR) 20 MG tablet Take 20 mg by mouth at bedtime.   Yes [provider]  cefTRIAXone (ROCEPHIN) IVPB Inject 1 g into the vein daily.   Yes [provider]  clonazePAM (KLONOPIN) 0.5 MG tablet Take 1 tablet (0.5 mg total) by mouth 2 (two) times daily. 11/30/19  Yes Thurnell Lose, MD  doxycycline (VIBRAMYCIN) 100 MG capsule Take 100 mg by mouth 2 (two) times daily.   Yes [provider]  Emollient (EUCERIN SKIN CALMING) CREA Apply topically in the morning and at bedtime. To legs, arms and back   Yes [provider]  ferrous sulfate 325 (65 FE) MG tablet Take 1 tablet (325 mg total) by mouth 2 (two) times daily with a meal. 09/21/19  Yes Gonfa, Taye T, MD  fluticasone furoate-vilanterol (BREO ELLIPTA) 200-25 MCG/INH AEPB Inhale 1 puff into the lungs daily. 09/22/19  Yes Mercy Riding, MD  guaiFENesin (MUCINEX) 600 MG 12 hr tablet Take 600 mg by mouth 2 (two) times daily.   Yes [provider]  ipratropium (ATROVENT) 0.03 % nasal spray Place 1 spray into the nose daily as needed for congestion. 11/02/19  Yes [provider]  ipratropium-albuterol (DUONEB) 0.5-2.5 (3) MG/3ML SOLN Take 3 mLs by nebulization every 8 (eight) hours.   Yes [provider]  magnesium oxide (MAG-OX) 400 MG tablet Take 400 mg by mouth in the morning, at noon, and at bedtime.   Yes [provider]  Multiple Vitamin (MULTIVITAMIN WITH MINERALS) TABS tablet Take 1 tablet by mouth daily.   Yes [provider]  pantoprazole (PROTONIX) 40 MG tablet Take 1 tablet (40 mg total) by mouth daily. 11/17/19  Yes Thurnell Lose, MD  polyethylene glycol powder (MIRALAX) 17 GM/SCOOP powder Take 17 g by mouth 2 (two) times daily as needed for moderate constipation. 09/21/19  Yes Mercy Riding, MD    QUEtiapine (SEROQUEL) 25 MG tablet Take 1 tablet (25 mg total) by mouth at bedtime. 11/30/19  Yes Thurnell Lose, MD  tamsulosin (FLOMAX) 0.4 MG CAPS capsule Take 1 capsule (0.4 mg total) by mouth daily after supper. Patient taking differently: Take 0.8 mg by mouth daily after supper.  05/18/18  Yes Debbe Odea, MD  umeclidinium bromide (INCRUSE ELLIPTA) 62.5 MCG/INH AEPB Inhale 1 puff into the lungs daily. 09/22/19  Yes Mercy Riding, MD  vitamin B-12 (CYANOCOBALAMIN) 500 MCG tablet Take 500 mcg by mouth 2 (two) times daily.   Yes [provider]  amiodarone (PACERONE) 200 MG tablet Take 1 tablet (200 mg total) by mouth daily. Patient not taking: Reported on 01/23/2020 12/01/19   Thurnell Lose, MD  carvedilol (COREG) 3.125 MG tablet Take 1 tablet (3.125 mg total) by mouth 2 (two) times daily with a meal. Patient not taking: Reported on 01/18/2020 11/30/19   Thurnell Lose, MD  feeding supplement, ENSURE ENLIVE, (ENSURE ENLIVE)  LIQD Take 237 mLs by mouth 2 (two) times daily between meals. Patient not taking: Reported on 01/23/2020 11/30/19   Thurnell Lose, MD    Inpatient Medications: Scheduled Meds: . amiodarone  100 mg Oral Daily  . aspirin EC  81 mg Oral Daily  . atorvastatin  20 mg Oral QHS  . clonazePAM  0.5 mg Oral BID  . ferrous sulfate  325 mg Oral BID WC  . fluticasone furoate-vilanterol  1 puff Inhalation Daily  . furosemide  40 mg Intravenous BID  . guaiFENesin  600 mg Oral BID  . heparin  5,000 Units Subcutaneous Q8H  . ipratropium-albuterol  3 mL Nebulization Q8H  . magnesium oxide  400 mg Oral Daily  . multivitamin with minerals  1 tablet Oral Daily  . pantoprazole  40 mg Oral Daily  . QUEtiapine  25 mg Oral QHS  . senna-docusate  2 tablet Oral QHS  . tamsulosin  0.8 mg Oral QPC supper  . umeclidinium bromide  1 puff Inhalation Daily  . vitamin B-12  500 mcg Oral BID   Continuous Infusions:  PRN Meds: acetaminophen, albuterol, ondansetron **OR**  ondansetron (ZOFRAN) IV, polyethylene glycol  Allergies:   No Known Allergies  Social History:   Social History   Socioeconomic History  . Marital status: Unknown    Spouse name: Not on file  . Number of children: Not on file  . Years of education: Not on file  . Highest education level: Not on file  Occupational History  . Not on file  Tobacco Use  . Smoking status: Former Smoker    Types: Cigarettes  . Smokeless tobacco: Never Used  Vaping Use  . Vaping Use: Never used  Substance and Sexual Activity  . Alcohol use: Not Currently  . Drug use: Not Currently  . Sexual activity: Not Currently  Other Topics Concern  . Not on file  Social History Narrative  . Not on file   Social Determinants of Health   Financial Resource Strain:   . Difficulty of Paying Living Expenses:   Food Insecurity:   . Worried About Charity fundraiser in the Last Year:   . Arboriculturist in the Last Year:   Transportation Needs:   . Film/video editor (Medical):   Marland Kitchen Lack of Transportation (Non-Medical):   Physical Activity:   . Days of Exercise per Week:   . Minutes of Exercise per Session:   Stress:   . Feeling of Stress :   Social Connections:   . Frequency of Communication with Friends and Family:   . Frequency of Social Gatherings with Friends and Family:   . Attends Religious Services:   . Active Member of Clubs or Organizations:   . Attends Archivist Meetings:   Marland Kitchen Marital Status:   Intimate Partner Violence:   . Fear of Current or Ex-Partner:   . Emotionally Abused:   Marland Kitchen Physically Abused:   . Sexually Abused:     Family History:   Family History  Problem Relation Age of Onset  . Diabetes Mellitus II Mother   . Diabetes Mellitus II Maternal Grandmother   . Heart disease Maternal Grandmother      ROS:  Please see the history of present illness.  All other ROS reviewed and negative.     Physical Exam/Data:   Vitals:   01/24/20 0830 01/24/20 0915  01/24/20 1000 01/24/20 1014  BP: (!) 134/53 (!) 115/47 (!) 121/52   Pulse:  88 82 79   Resp: (!) 33 (!) 33 (!) 28   Temp:      TempSrc:      SpO2: 95% (!) 81% (!) 88%   Weight:    62.1 kg  Height:    5' 8"  (1.727 m)    Intake/Output Summary (Last 24 hours) at 01/24/2020 1500 Last data filed at 01/24/2020 1013 Gross per 24 hour  Intake 2500250 ml  Output 2350 ml  Net 2497900 ml   Last 3 Weights 01/24/2020 01/23/2020 01/09/2020  Weight (lbs) 137 lb 138 lb 0.1 oz 138 lb  Weight (kg) 62.143 kg 62.6 kg 62.596 kg     Body mass index is 20.83 kg/m.  General: Pale appearing WM in no acute distress, on NRB Head: Normocephalic, atraumatic, sclera non-icteric, no xanthomas, nares are without discharge. Neck: Negative for carotid bruits. JVP not elevated. Lungs: Diffusely diminished throughout without wheezes, rales, or rhonchi. Breathing is unlabored. Heart: RRR S1 S2 without murmurs, rubs, or gallops.  Abdomen: Soft, non-tender, non-distended with normoactive bowel sounds. No rebound/guarding. Extremities: No clubbing or cyanosis. No edema. Distal pedal pulses are 2+ and equal bilaterally. Neuro: Alert and oriented X 3. Moves all extremities spontaneously. Psych:  Responds to questions appropriately with a normal affect.  EKG:  The EKG was personally reviewed and demonstrates:  Sinus tach 100bpm, TWI I,a vL no acute change from prior  Telemetry:  Telemetry was personally reviewed and demonstrates:  NSR/sinus tach  Relevant CV Studies: 2D echo 01/24/20 1. Left ventricular ejection fraction, by estimation, is 60 to 65%. The  left ventricle has normal function. The left ventricle has no regional  wall motion abnormalities. There is mild left ventricular hypertrophy.  Left ventricular diastolic parameters  are consistent with Grade II diastolic dysfunction (pseudonormalization).  Elevated left atrial pressure.  2. Right ventricular systolic function is normal. The right ventricular  size  is mildly enlarged. Tricuspid regurgitation signal is inadequate for  assessing PA pressure.  3. The mitral valve is normal in structure. Mild mitral valve  regurgitation.  4. The aortic valve is tricuspid. Aortic valve regurgitation is not  visualized. Mild to moderate aortic valve sclerosis/calcification is  present, without any evidence of aortic stenosis.   Laboratory Data:  High Sensitivity Troponin:   Recent Labs  Lab 01/24/20 0049 01/24/20 0253 01/24/20 0619 01/24/20 0833  TROPONINIHS 956* 1,154* 1,831* 1,903*     Chemistry Recent Labs  Lab 01/23/20 2336 01/23/20 2343 01/24/20 0619  NA 140 139 140  K 4.4 4.5 4.8  CL  --  103 105  CO2  --  24 23  GLUCOSE  --  219* 121*  BUN  --  26* 25*  CREATININE  --  2.51* 2.47*  CALCIUM  --  9.0 8.4*  GFRNONAA  --  24* 24*  GFRAA  --  28* 28*  ANIONGAP  --  12 12    Recent Labs  Lab 01/23/20 2343 01/24/20 0619  PROT 7.7 6.0*  ALBUMIN 3.0* 2.5*  AST 14* 15  ALT 11 9  ALKPHOS 86 67  BILITOT 0.7 1.0   Hematology Recent Labs  Lab 01/23/20 2336 01/23/20 2343 01/24/20 0619  WBC  --  9.0 8.3  RBC  --  2.97* 2.65*  HGB 9.5* 8.2* 7.3*  HCT 28.0* 27.8* 25.2*  MCV  --  93.6 95.1  MCH  --  27.6 27.5  MCHC  --  29.5* 29.0*  RDW  --  17.8* 18.1*  PLT  --  198 177   BNP Recent Labs  Lab 01/23/20 2343  BNP 836.2*    DDimer No results for input(s): DDIMER in the last 168 hours.   Radiology/Studies:   TTE 01/24/2020   1. Left ventricular ejection fraction, by estimation, is 60 to 65%. The  left ventricle has normal function. The left ventricle has no regional  wall motion abnormalities. There is mild left ventricular hypertrophy.  Left ventricular diastolic parameters  are consistent with Grade II diastolic dysfunction (pseudonormalization).  Elevated left atrial pressure.  2. Right ventricular systolic function is normal. The right ventricular  size is mildly enlarged. Tricuspid regurgitation signal is  inadequate for  assessing PA pressure.  3. The mitral valve is normal in structure. Mild mitral valve  regurgitation.  4. The aortic valve is tricuspid. Aortic valve regurgitation is not  visualized. Mild to moderate aortic valve sclerosis/calcification is  present, without any evidence of aortic stenosis. [id number]}    TIMI Risk Score for Unstable Angina or Non-ST Elevation MI:   The patient's TIMI risk score is 5, which indicates a 26% risk of all cause mortality, new or recurrent myocardial infarction or need for urgent revascularization in the next 14 days.   New York Heart Association (NYHA) Functional Class NYHA Class IV  Assessment and Plan:   1. Acute on chronic hypoxic respiratory failure with multifactorial underlying pulmonary fibrosis, a/c diastolic CHF, h/o [id number] PNA in 11/2019 - continue plan for diuresis and supplemental O2 - d/w Dr. Reesa Chew, given h/o Covid and persistent hypoxia, question whether to pursue r/o PE. His renal function is prohibitive of CTA. Dr. Reesa Chew plans to follow response to diuretic and consider PE r/o tomorrow and will pursue LE venous duplex today - will also check respiratory viral panel to exclude other pathogens given his one isolated temp in the ED - consider pulmonary consultation if this does not improve - check ESR and CRP given amio use - will review with Dr. Meda Coffee  2. Acute on chronic diastolic CHF - LVEF preserved as above - agree with trial of Lasix - strict I/o's, daily weights - dry weight unclear as he is presently 15 lb below previous discharge weight  3. Elevated troponin in the context of known CAD s/p CABG 2005 - hsTroponins [phone], higher than would typically expect for demand ischemia so do suspect some degree of underlying residual disease - per notes, the team spoke with on-call cardiologist who recommended to hold off IV heparin which seems reasonable given declining Hgb - continue ASA, statin (LDL 41 in  09/2019) - hold off beta blocker given hypotension at times, was not taking carvedilol recently per pharmacy intake - no recent chest pain noted and he has normal LV function - in context of acute illness, CKD, and anemia he has previously been felt to be a poor invasive candidate which seems the case here as well. Will review with MD  4. CKD stage IV - baseline Cr hard to pinpoint, anywhere from 2-3 recently - monitor closely in context of the above  5. Acute on chronic anemia - baseline Hgb has been variable from 6-9 range, 7.3 today - hemoccult stool - monitor daily  6. Abnormal TSH  - will get fT4 and total T3 given ongoing amiodarone use  7. Paroxysmal atrial fibrillation, diagnosed 11/2019 in context of Covid - maintaining NSR on amiodarone - question whether this can be discontinued given his underlying pulmonary issues and only one known admission with afib - not on anticoagulation  due to anemia and fall risk  For questions or updates, please contact [place] Please consult [email or web address] for contact info under   Signed, Charlie Pitter, PA-C  01/24/2020 3:00 PM  The patient was seen, examined and discussed with Melina Copa, PA-C and I agree with the above.   79 y.o. male with a hx of CAD s/p prior CABG([facility] 02/2004), DM2, HTN, HLD, pulmonary fibrosis with chronic respiratory failure on home O2, Covid-19 PNA 11/2019, schizoaffective disorderin a group home, CKD IV, anemia of chronic disease, GERD, chronic diastolic CHF, lung nodule, extensive tobacco history, dysphagia, chronic appearing anemia who is being seen today for the evaluation of elevated troponin/CHF -Admitted in 09/2019 for dyspnea - elevated Cr, BNP, and CT showing pulmonary fibrosis. His troponin was mildly elevated to 102 at that time, felt due to demand ischemia. He was felt to be a poor candidate for invasive procedures. -Readmitted 5/10-5/28/21 with acute hypoxic respiratory failure in the context of Covid 19  PNA, complicated by atrial fib RVR, hypotension, AKI on CKD, anemia requiring transfusion, and requirement of home O2 at discharge. He was not felt to be a good candidate for anticoagulation due to fall risk and anemia. He has been maintained on oral amiodarone. -Readmitted on 7/21 - worsening SOB with hypoxia on his regular 2-3L, no chest pain.  In the ED temperature of 100.6 and chills on physical exam   Labs show hsTropnin [phone], BNP 836, lactic acid 2.1, TSH 5.133, Hgb 9.5-8.2-7.3 (previously 8.4 in 11/2019), Cr 2.51 (most recent range variable (2-3), CXR shows interval development of extensive mid and lower lung zone pulmonary infiltrates superimposed upon existing chronic interstitial changes, superimposed pulmonary edema -> started on IV Lasix 2m BID with first doses at 0254 and 1330 -> progressive hypoxia decreased to 75%on 6L requiring NRB to maintain 88% and above. 2D echo reveals EF 60-65%, grade 2 DD, mildly enlarged RV with normal systolic function, mild MR. BP low-normal at times, HR normal.  - weight down about 15lb from prior admission in May.  Assessment and Plan:  Acute on chronic respiratory failure with hypoxia Possible underlying pneumonia superimposed on pulmonary fibrosis, Acute on chronic diastolic CHF Demand ischemia Acute on chronic kidney failure Chronic anemia of chronic diseases Paroxysmal atrial fibrillation  Continue IV diuresis with Lasix 40 mg IV twice daily, monitor creatinine weights I's and O's closely, I would discontinue amiodarone in the settings of interstitial fibrosis and new worsening interstitial findings.  I would definitely recommend to involve pulmonary for further evaluation and recommendation.  TSH is just borderline.  Management of respiratory failure per primary team. Troponin elevation represents demand ischemia in the settings of acute heart failure and hypoxic respiratory failure, with the degree of elevation we should technically  start heparin for 48 hours however with worsening anemia I would hold off for now and continue cycling troponins, reconsider later depending on degree of troponin and hemoglobin.  Reassuring thing is that his LVEF on the echo today is 60 to 65% without any new wall motion abnormalities.  KEna Dawley MD 01/24/2020

## 2020-01-24 NOTE — Progress Notes (Signed)
PROGRESS NOTE    Mikaeel Petrow  KDT:[id number] DOB: 16-Dec-1940 DOA: 01/23/2020 PCP: [name]   Brief Narrative:  79 year old with history of pulmonary fibrosis, paroxysmal A. fib, diastolic CHF, CKD stage IV, anemia of chronic disease, COVID-19 pneumonia 2 months ago admitted for shortness of breath form SNF.  Prior to admission was treated with 5 days of empiric antibiotic for presumed pneumonia.  Upon admission he was quite hypoxic with concerns for CHF exacerbation versus pneumonia.   Assessment & Plan:   Principal Problem:   Acute respiratory failure with hypoxia (HCC) Active Problems:   Essential hypertension   Pulmonary fibrosis (HCC)   Schizo affective schizophrenia ([place])   Sepsis ([place])   CKD (chronic kidney disease), stage IV (HCC)   Acute on chronic diastolic CHF (congestive heart failure) (HCC)   CAP (community acquired pneumonia)   NSTEMI (non-ST elevated myocardial infarction) ([place])  Acute hypoxic respiratory failure Acute congestive heart failure with preserved ejection fraction, 65% Presumed bilateral lower lobe pneumonia at admission -Echocardiogram 11/2019-EF 60 to 99%, grade 2 diastolic dysfunction -Procalcitonin is negative, will discontinue antibiotics. -Bronchodilators -Lasix 40 mg IV X2 doses.  Reevaluate tomorrow for further need -Wean off oxygen as deemed appropriate.  Elevated troponin, suspect demand ischemia -Suspect demand ischemia.  Discussed by overnight provider with cardiology, did not recommend heparin drip advise monitoring and obtaining echocardiogram.  Atrial fibrillation, paroxysmal -Continue amiodarone.  Not on any home anticoagulation due to risk of fall  Chronic kidney disease stage IV -Around baseline creatinine of 2.4.  Continue to monitor.  Anemia of chronic disease -Hemoglobin appears to be stable without any evidence of bleeding -Continue aspirin, iron supplements.  GERD -PPI  History of COPD History of  pulmonary fibrosis with chronic hypoxia on 2 L nasal cannula -Bronchodilators, incentive spirometer and flutter valve as needed.  History of schizoaffective disorder -Seroquel  BPH -Flomax   DVT prophylaxis: heparin injection 5,000 Units Start: 01/24/20 0600 Code Status: Full Family Communication:    Status is: Inpatient  Remains inpatient appropriate because:IV treatments appropriate due to intensity of illness or inability to take PO   Dispo: The patient is from: Home              Anticipated d/c is to: Home              Anticipated d/c date is: 2 days              Patient currently is not medically stable to d/c.  Currently patient is getting IV diuretics, still significantly hypoxic requiring IV diuretics.       Body mass index is 20.98 kg/m.  Pressure Injury 11/27/19 Ear Left Stage 2 -  Partial thickness loss of dermis presenting as a shallow open injury with a red, pink wound bed without slough. Red/pink (Active)  11/27/19 1350  Location: Ear  Location Orientation: Left  Staging: Stage 2 -  Partial thickness loss of dermis presenting as a shallow open injury with a red, pink wound bed without slough.  Wound Description (Comments): Red/pink  Present on Admission: No     Subjective: Still has some exertional dyspnea but no other complaints at this time  Review of Systems Otherwise negative except as per HPI, including: General: Denies fever, chills, night sweats or unintended weight loss. Resp: Denies hemoptysis Cardiac: Denies chest pain, palpitations, orthopnea, paroxysmal nocturnal dyspnea. GI: Denies abdominal pain, nausea, vomiting, diarrhea or constipation GU: Denies dysuria, frequency, hesitancy or incontinence MS: Denies muscle aches,  joint pain or swelling Neuro: Denies headache, neurologic deficits (focal weakness, numbness, tingling), abnormal gait Psych: Denies anxiety, depression, SI/HI/AVH Skin: Denies new rashes or lesions ID: Denies sick  contacts, exotic exposures, travel  Examination:  General exam: Appears calm and comfortable, a liter high flow nasal cannula Respiratory system: Bibasilar rhonchi Cardiovascular system: S1 & S2 heard, RRR. No JVD, murmurs, rubs, gallops or clicks. No pedal edema. Gastrointestinal system: Abdomen is nondistended, soft and nontender. No organomegaly or masses felt. Normal bowel sounds heard. Central nervous system: Alert and oriented. No focal neurological deficits. Extremities: Symmetric 5 x 5 power. Skin: No rashes, lesions or ulcers Psychiatry: Judgement and insight appear normal. Mood & affect appropriate.     Objective: Vitals:   01/24/20 0330 01/24/20 0345 01/24/20 0530 01/24/20 0615  BP: (!) 100/48 (!) 117/59 94/64 (!) 112/54  Pulse: 63 68 (!) 58 62  Resp: (!) 27 (!) 26 (!) 28 (!) 21  Temp:      TempSrc:      SpO2: 100% 100% 96% 98%  Weight:      Height:        Intake/Output Summary (Last 24 hours) at 01/24/2020 0814 Last data filed at 01/24/2020 0612 Gross per 24 hour  Intake 250 ml  Output 800 ml  Net -550 ml   Filed Weights   01/23/20 2240  Weight: 62.6 kg     Data Reviewed:   CBC: Recent Labs  Lab 01/23/20 2336 01/23/20 2343 01/24/20 0619  WBC  --  9.0 8.3  NEUTROABS  --  7.3 5.1  HGB 9.5* 8.2* 7.3*  HCT 28.0* 27.8* 25.2*  MCV  --  93.6 95.1  PLT  --  198 481   Basic Metabolic Panel: Recent Labs  Lab 01/23/20 2336 01/23/20 2343 01/24/20 0619  NA 140 139 140  K 4.4 4.5 4.8  CL  --  103 105  CO2  --  24 23  GLUCOSE  --  219* 121*  BUN  --  26* 25*  CREATININE  --  2.51* 2.47*  CALCIUM  --  9.0 8.4*   GFR: Estimated Creatinine Clearance: 22.2 mL/min (A) (by C-G formula based on SCr of 2.47 mg/dL (H)). Liver Function Tests: Recent Labs  Lab 01/23/20 2343 01/24/20 0619  AST 14* 15  ALT 11 9  ALKPHOS 86 67  BILITOT 0.7 1.0  PROT 7.7 6.0*  ALBUMIN 3.0* 2.5*   No results for input(s): LIPASE, AMYLASE in the last 168 hours. No  results for input(s): AMMONIA in the last 168 hours. Coagulation Profile: Recent Labs  Lab 01/24/20 0049  INR 1.1   Cardiac Enzymes: Recent Labs  Lab 01/24/20 0619  CKTOTAL 129   BNP (last 3 results) No results for input(s): PROBNP in the last 8760 hours. HbA1C: No results for input(s): HGBA1C in the last 72 hours. CBG: No results for input(s): GLUCAP in the last 168 hours. Lipid Profile: No results for input(s): CHOL, HDL, LDLCALC, TRIG, CHOLHDL, LDLDIRECT in the last 72 hours. Thyroid Function Tests: Recent Labs    01/24/20 0619  TSH 5.133*   Anemia Panel: No results for input(s): VITAMINB12, FOLATE, FERRITIN, TIBC, IRON, RETICCTPCT in the last 72 hours. Sepsis Labs: Recent Labs  Lab 01/23/20 2343 01/24/20 8563  LATICACIDVEN 2.1* 1.8    No results found for this or any previous visit (from the past 240 hour(s)).       Radiology Studies: DG Chest Port 1 View  Result Date: 01/23/2020 CLINICAL DATA:  Dyspnea EXAM: PORTABLE CHEST 1 VIEW COMPARISON:  11/20/2019, CT 09/19/2019 FINDINGS: Lung volumes are small, but are symmetric. Moderate mid and lower lung zone interstitial pulmonary infiltrate has developed,, significantly progressed since prior examination. Given the findings on prior CT examination, this may represent progression of the patient's underlying interstitial lung disease, or more likely, superimposed progressive pulmonary edema or atypical infection. Cavitary lesion within the right mid lung zone represents a pneumatocele noted on a prior CT exam in this region. No pneumothorax or pleural effusion. Coronary artery bypass grafting has been performed. Cardiac size is within normal limits. No acute bone abnormality. IMPRESSION: Interval development of extensive mid and lower lung zone pulmonary infiltrates superimposed upon axis ting chronic interstitial changes. Of the various considerations listed above, superimposed pulmonary edema appears most likely.  Pulmonary hypoinflation. Electronically Signed   By: Fidela Salisbury MD   On: 01/23/2020 23:24        Scheduled Meds: . amiodarone  100 mg Oral Daily  . aspirin EC  81 mg Oral Daily  . atorvastatin  20 mg Oral QHS  . clonazePAM  0.5 mg Oral BID  . ferrous sulfate  325 mg Oral BID WC  . fluticasone furoate-vilanterol  1 puff Inhalation Daily  . guaiFENesin  600 mg Oral BID  . heparin  5,000 Units Subcutaneous Q8H  . ipratropium-albuterol  3 mL Nebulization Q8H  . magnesium oxide  400 mg Oral Daily  . multivitamin with minerals  1 tablet Oral Daily  . pantoprazole  40 mg Oral Daily  . QUEtiapine  25 mg Oral QHS  . tamsulosin  0.8 mg Oral QPC supper  . umeclidinium bromide  1 puff Inhalation Daily  . vitamin B-12  500 mcg Oral BID   Continuous Infusions: . [START ON 01/25/2020] ceFEPime (MAXIPIME) IV    . doxycycline (VIBRAMYCIN) IV 100 mg (01/24/20 0603)  . [START ON 01/25/2020] vancomycin       LOS: 0 days   Time spent= 35 mins    Kellen Hover Arsenio Loader, MD [facility]  If 7PM-7AM, please contact night-coverage  01/24/2020, 8:22 AM

## 2020-01-24 NOTE — ED Notes (Signed)
Breakfast Ordered--Robert Little  

## 2020-01-24 NOTE — H&P (Addendum)
History and Physical    Robert Little QMV:[id number] DOB: 05-22-1941 DOA: 01/23/2020  PCP: [name]  Patient coming from: Skilled nursing facility.  Chief Complaint: Shortness of breath.  HPI: Robert Little is a 79 y.o. male with history of pulmonary fibrosis, paroxysmal atrial fibrillation, diastolic CHF, chronic kidney disease stage IV, chronic anemia who was admitted 2 months ago for Covid pneumonia with sepsis was brought to the ER from the skilled nursing facility after patient was getting more short of breath.  Patient as per the report was short of breath for the last 4 to 5 days was started on empiric antibiotics for pneumonia.  Despite which patient was getting progressively short of breath on exertion.  No chest pain has been having some productive cough.  ED Course: In the ER patient was initially hypoxic requiring 15 L oxygen but soon was able to be weaned off to 6 L.  Was given Lasix 40 mg IV along with empiric antibiotics for pneumonia.  Labs significant for BNP of 836 high sensitive troponin increase from 900-11 100.  EKG was showing tachycardia.  With nonspecific changes.  Chest x-ray shows features concerning for pneumonia.  Patient was febrile on admission.  Lactic acid was 2.1.  Blood glucose 219.  Review of Systems: As per HPI, rest all negative.   Past Medical History:  Diagnosis Date  . Anemia   . Coronary artery disease   . GERD (gastroesophageal reflux disease)   . Hypertension   . Pulmonary fibrosis ([place])   . Schizo affective schizophrenia [facility])     Past Surgical History:  Procedure Laterality Date  . CORONARY ARTERY BYPASS GRAFT       reports that he has quit smoking. His smoking use included cigarettes. He has never used smokeless tobacco. He reports previous alcohol use. He reports previous drug use.  No Known Allergies  Family History  Problem Relation Age of Onset  . Diabetes Mellitus II Mother   . Diabetes Mellitus II  Maternal Grandmother   . Heart disease Maternal Grandmother     Prior to Admission medications   Medication Sig Start Date End Date Taking? Authorizing Provider  acetaminophen (TYLENOL) 325 MG tablet Take 2 tablets (650 mg total) by mouth every 6 (six) hours as needed for mild pain (or Fever >/= 101). Patient taking differently: Take 650 mg by mouth every 6 (six) hours as needed for mild pain.  05/18/18  Yes Debbe Odea, MD  albuterol (VENTOLIN HFA) 108 (90 Base) MCG/ACT inhaler Inhale 2 puffs into the lungs every 6 (six) hours as needed for wheezing or shortness of breath. 09/21/19  Yes Mercy Riding, MD  amiodarone (PACERONE) 100 MG tablet Take 100 mg by mouth daily.   Yes [provider]  aspirin EC 81 MG EC tablet Take 1 tablet (81 mg total) by mouth daily. 09/22/19  Yes Mercy Riding, MD  atorvastatin (LIPITOR) 20 MG tablet Take 20 mg by mouth at bedtime.   Yes [provider]  cefTRIAXone (ROCEPHIN) IVPB Inject 1 g into the vein daily.   Yes [provider]  clonazePAM (KLONOPIN) 0.5 MG tablet Take 1 tablet (0.5 mg total) by mouth 2 (two) times daily. 11/30/19  Yes Thurnell Lose, MD  doxycycline (VIBRAMYCIN) 100 MG capsule Take 100 mg by mouth 2 (two) times daily.   Yes [provider]  Emollient (EUCERIN SKIN CALMING) CREA Apply topically in the morning and at bedtime. To legs, arms and  back   Yes [provider]  ferrous sulfate 325 (65 FE) MG tablet Take 1 tablet (325 mg total) by mouth 2 (two) times daily with a meal. 09/21/19  Yes Gonfa, Taye T, MD  fluticasone furoate-vilanterol (BREO ELLIPTA) 200-25 MCG/INH AEPB Inhale 1 puff into the lungs daily. 09/22/19  Yes Mercy Riding, MD  guaiFENesin (MUCINEX) 600 MG 12 hr tablet Take 600 mg by mouth 2 (two) times daily.   Yes [provider]  ipratropium (ATROVENT) 0.03 % nasal spray Place 1 spray into the nose daily as needed for congestion. 11/02/19  Yes [provider]    ipratropium-albuterol (DUONEB) 0.5-2.5 (3) MG/3ML SOLN Take 3 mLs by nebulization every 8 (eight) hours.   Yes [provider]  magnesium oxide (MAG-OX) 400 MG tablet Take 400 mg by mouth in the morning, at noon, and at bedtime.   Yes [provider]  Multiple Vitamin (MULTIVITAMIN WITH MINERALS) TABS tablet Take 1 tablet by mouth daily.   Yes [provider]  pantoprazole (PROTONIX) 40 MG tablet Take 1 tablet (40 mg total) by mouth daily. 11/17/19  Yes Thurnell Lose, MD  polyethylene glycol powder (MIRALAX) 17 GM/SCOOP powder Take 17 g by mouth 2 (two) times daily as needed for moderate constipation. 09/21/19  Yes Mercy Riding, MD  QUEtiapine (SEROQUEL) 25 MG tablet Take 1 tablet (25 mg total) by mouth at bedtime. 11/30/19  Yes Thurnell Lose, MD  tamsulosin (FLOMAX) 0.4 MG CAPS capsule Take 1 capsule (0.4 mg total) by mouth daily after supper. Patient taking differently: Take 0.8 mg by mouth daily after supper.  05/18/18  Yes Debbe Odea, MD  umeclidinium bromide (INCRUSE ELLIPTA) 62.5 MCG/INH AEPB Inhale 1 puff into the lungs daily. 09/22/19  Yes Mercy Riding, MD  vitamin B-12 (CYANOCOBALAMIN) 500 MCG tablet Take 500 mcg by mouth 2 (two) times daily.   Yes [provider]  amiodarone (PACERONE) 200 MG tablet Take 1 tablet (200 mg total) by mouth daily. Patient not taking: Reported on 01/23/2020 12/01/19   Thurnell Lose, MD  carvedilol (COREG) 3.125 MG tablet Take 1 tablet (3.125 mg total) by mouth 2 (two) times daily with a meal. Patient not taking: Reported on 01/18/2020 11/30/19   Thurnell Lose, MD  feeding supplement, ENSURE ENLIVE, (ENSURE ENLIVE) LIQD Take 237 mLs by mouth 2 (two) times daily between meals. Patient not taking: Reported on 01/23/2020 11/30/19   Thurnell Lose, MD    Physical Exam: Constitutional: Moderately built and nourished. Vitals:   01/24/20 0243 01/24/20 0307 01/24/20 0330 01/24/20 0345  BP: (!) 110/50  (!) 100/48  (!) 117/59  Pulse: 65  63 68  Resp: (!) 23  (!) 27 (!) 26  Temp:  97.9 F (36.6 C)    TempSrc:  Oral    SpO2: 100%  100% 100%  Weight:      Height:       Eyes: Anicteric no pallor. ENMT: No discharge from the ears eyes nose or mouth. Neck: No mass felt.  No neck rigidity. Respiratory: No rhonchi or crepitations. Cardiovascular: S1-S2 heard. Abdomen: Soft nontender bowel sounds present. Musculoskeletal: No edema. Skin: No rash. Neurologic: Alert awake oriented to place and person moves all extremities. Psychiatric: Appears normal.   Labs on Admission: I have personally reviewed following labs and imaging studies  CBC: Recent Labs  Lab 01/23/20 2336 01/23/20 2343  WBC  --  9.0  NEUTROABS  --  7.3  HGB 9.5*  8.2*  HCT 28.0* 27.8*  MCV  --  93.6  PLT  --  782   Basic Metabolic Panel: Recent Labs  Lab 01/23/20 2336 01/23/20 2343  NA 140 139  K 4.4 4.5  CL  --  103  CO2  --  24  GLUCOSE  --  219*  BUN  --  26*  CREATININE  --  2.51*  CALCIUM  --  9.0   GFR: Estimated Creatinine Clearance: 21.8 mL/min (A) (by C-G formula based on SCr of 2.51 mg/dL (H)). Liver Function Tests: Recent Labs  Lab 01/23/20 2343  AST 14*  ALT 11  ALKPHOS 86  BILITOT 0.7  PROT 7.7  ALBUMIN 3.0*   No results for input(s): LIPASE, AMYLASE in the last 168 hours. No results for input(s): AMMONIA in the last 168 hours. Coagulation Profile: Recent Labs  Lab 01/24/20 0049  INR 1.1   Cardiac Enzymes: No results for input(s): CKTOTAL, CKMB, CKMBINDEX, TROPONINI in the last 168 hours. BNP (last 3 results) No results for input(s): PROBNP in the last 8760 hours. HbA1C: No results for input(s): HGBA1C in the last 72 hours. CBG: No results for input(s): GLUCAP in the last 168 hours. Lipid Profile: No results for input(s): CHOL, HDL, LDLCALC, TRIG, CHOLHDL, LDLDIRECT in the last 72 hours. Thyroid Function Tests: No results for input(s): TSH, T4TOTAL, FREET4, T3FREE, THYROIDAB in the  last 72 hours. Anemia Panel: No results for input(s): VITAMINB12, FOLATE, FERRITIN, TIBC, IRON, RETICCTPCT in the last 72 hours. Urine analysis:    Component Value Date/Time   COLORURINE YELLOW 01/24/2020 0306   APPEARANCEUR CLEAR 01/24/2020 0306   LABSPEC 1.015 01/24/2020 0306   PHURINE 5.0 01/24/2020 0306   GLUCOSEU NEGATIVE 01/24/2020 0306   HGBUR NEGATIVE 01/24/2020 0306   BILIRUBINUR NEGATIVE 01/24/2020 0306   KETONESUR NEGATIVE 01/24/2020 0306   PROTEINUR NEGATIVE 01/24/2020 0306   NITRITE NEGATIVE 01/24/2020 0306   LEUKOCYTESUR NEGATIVE 01/24/2020 0306   Sepsis Labs: @LABRCNTIP (procalcitonin:4,lacticidven:4) )No results found for this or any previous visit (from the past 240 hour(s)).   Radiological Exams on Admission: DG Chest Port 1 View  Result Date: 01/23/2020 CLINICAL DATA:  Dyspnea EXAM: PORTABLE CHEST 1 VIEW COMPARISON:  11/20/2019, CT 09/19/2019 FINDINGS: Lung volumes are small, but are symmetric. Moderate mid and lower lung zone interstitial pulmonary infiltrate has developed,, significantly progressed since prior examination. Given the findings on prior CT examination, this may represent progression of the patient's underlying interstitial lung disease, or more likely, superimposed progressive pulmonary edema or atypical infection. Cavitary lesion within the right mid lung zone represents a pneumatocele noted on a prior CT exam in this region. No pneumothorax or pleural effusion. Coronary artery bypass grafting has been performed. Cardiac size is within normal limits. No acute bone abnormality. IMPRESSION: Interval development of extensive mid and lower lung zone pulmonary infiltrates superimposed upon axis ting chronic interstitial changes. Of the various considerations listed above, superimposed pulmonary edema appears most likely. Pulmonary hypoinflation. Electronically Signed   By: Fidela Salisbury MD   On: 01/23/2020 23:24    EKG: Independently reviewed.  Sinus  tachycardia.  Assessment/Plan Principal Problem:   Acute respiratory failure with hypoxia (HCC) Active Problems:   Essential hypertension   Pulmonary fibrosis (HCC)   Schizo affective schizophrenia (HCC)   Sepsis ([place])   CKD (chronic kidney disease), stage IV (HCC)   Acute on chronic diastolic CHF (congestive heart failure) (HCC)   CAP (community acquired pneumonia)   NSTEMI (non-ST elevated myocardial infarction) ([place])  1. Acute respiratory failure hypoxia with possible developing sepsis likely combination of pneumonia and CHF.  Patient has received 1 dose of Lasix 40 mg IV and based on patient's response will order further doses.  Patient is also on empiric antibiotics check procalcitonin.  Follow intake output Daily weights and and metabolic panel.  Last EF measured was an May 2021 with 60 to 65%.  Follow lactic acid levels. 2. Elevated troponin/non-ST elevation MI with no chest pain likely precipitated by demand ischemia.  Discussed with on-call cardiologist at this time recommended no heparin.  Cycle cardiac markers.  Patient is on aspirin and statins and beta-blockers.  I have repeated a 2D echo since patient's troponin is increased more than what it was 2 months ago. 3. A. fib presently on amiodarone and Coreg which will be continued.  Not on anticoagulation since patient high risk of fall. 4. Chronic kidney disease stage IV creatinine appears to be at baseline. 5. Chronic anemia likely from renal disease follow CBC.  On iron supplements. 6. COPD not actively wheezing. 7. Schizoaffective disorder on Seroquel.  Also takes Klonopin. 8. History of pulmonary fibrosis on 2 L oxygen presently on 6 L. 9. Patient was admitted 2 months ago for Covid pneumonia with sepsis Covid test was not repeated.   Since patient has acute respiratory failure with hypoxia with possible non-ST relation MI will need close monitoring and inpatient status.   DVT prophylaxis: Heparin. Code Status: Full  code.  This is a reversal from previous.  I have confirmed with patient's legal guardian [name]. Family Communication: Discussed with patient's legal guardian [name] as listed in the contact section. Disposition Plan: Back to facility when stable. Consults called: Cardiology. Admission status: Inpatient.   Rise Patience MD [facility] Pager [phone].  If 7PM-7AM, please contact night-coverage [email or web address] Password [facility]  01/24/2020, 5:07 AM

## 2020-01-24 NOTE — Sepsis Progress Note (Signed)
Notified bedside nurse of need to draw repeat lactic acid. 

## 2020-01-24 NOTE — ED Notes (Signed)
Sats decreased to 75% on 6 liters, Places on NRB sats increased to 88%.

## 2020-01-24 NOTE — ED Provider Notes (Signed)
Attestation: Medical screening examination/treatment/procedure(s) were conducted as a shared visit with non-physician practitioner(s) and myself.  I personally evaluated the patient during the encounter.   Briefly, the patient is a 79 y.o. male with h/o HTN, CAD, GERD, pulmonary fibrosis, anemia, schizoaffective schizophrenia, chronic diastolic CHF, presenting to the ED with shortness of breath worsening over the last 2-3 days.  Vitals:   01/24/20 0003 01/24/20 0004  BP:  (!) 110/49  Pulse:  87  Resp:  (!) 30  Temp: (!) 100.6 F (38.1 C)   SpO2:  95%    CONSTITUTIONAL:  nontoxic-appearing, NAD NEURO:  Alert and oriented x 3, no focal deficits EYES:  pupils equal and reactive ENT/NECK:  trachea midline, no JVD CARDIO:  tachy rate, reg rhythm, well-perfused PULM:  labored breathing GI/GU:  Abdomen non-distended MSK/SPINE:  No gross deformities, no edema SKIN:  no rash, atraumatic PSYCH:  Appropriate speech and behavior   EKG Interpretation  Date/Time:  Wednesday January 23 2020 22:38:25 EDT Ventricular Rate:  100 PR Interval:    QRS Duration: 93 QT Interval:  345 QTC Calculation: 445 R Axis:   11 Text Interpretation: Sinus tachycardia Repol abnrm suggests ischemia, lateral leads Otherwise no significant change Confirmed by Addison Lank [phone]) on 01/23/2020 11:07:15 PM       Work up concerning for persistent PNA with superimposed CHF exacerbation.  Abx given. Admitted for further management.  .Critical Care Performed by: Fatima Blank, MD Authorized by: Fatima Blank, MD      CRITICAL CARE Performed by: Grayce Sessions Randell Detter Total critical care time: 30 minutes Critical care time was exclusive of separately billable procedures and treating other patients. Critical care was necessary to treat or prevent imminent or life-threatening deterioration. Critical care was time spent personally by me on the following activities: development of treatment plan  with patient and/or surrogate as well as nursing, discussions with consultants, evaluation of patient's response to treatment, examination of patient, obtaining history from patient or surrogate, ordering and performing treatments and interventions, ordering and review of laboratory studies, ordering and review of radiographic studies, pulse oximetry and re-evaluation of patient's condition.       Fatima Blank, MD 01/26/20 [phone]

## 2020-01-24 NOTE — Progress Notes (Signed)
°  Echocardiogram 2D Echocardiogram with definity has been performed.  Darlina Sicilian M 01/24/2020, 9:56 AM

## 2020-01-24 NOTE — ED Notes (Signed)
Patient was turned and positioned and noted with dyspnea with exertion.

## 2020-01-24 NOTE — Progress Notes (Signed)
Pharmacy Antibiotic Note  Robert Little is a 79 y.o. male admitted on 01/23/2020 with pneumonia.  Pharmacy has been consulted for vancomycin and cefepime dosing.  Plan: Vancomycin 1250mg  x1 then 750mg  IV every 24 hours.  Goal trough 15-20 mcg/mL.  Cefepime 2g IV every 24 hours.  Height: 5\' 8"  (172.7 cm) Weight: 62.6 kg (138 lb 0.1 oz) IBW/kg (Calculated) : 68.4  Temp (24hrs), Avg:98.7 F (37.1 C), Min:97.6 F (36.4 C), Max:100.6 F (38.1 C)  Recent Labs  Lab 01/23/20 2343  WBC 9.0  CREATININE 2.51*  LATICACIDVEN 2.1*    Estimated Creatinine Clearance: 21.8 mL/min (A) (by C-G formula based on SCr of 2.51 mg/dL (H)).    No Known Allergies   Thank you for allowing pharmacy to be a part of this patient's care.  Wynona Neat, PharmD, BCPS  01/24/2020 5:25 AM

## 2020-01-24 NOTE — ED Notes (Signed)
Lactic Acid to be collected after IV antibiotics

## 2020-01-25 ENCOUNTER — Inpatient Hospital Stay (HOSPITAL_COMMUNITY): Payer: Medicare Other

## 2020-01-25 DIAGNOSIS — R0602 Shortness of breath: Secondary | ICD-10-CM

## 2020-01-25 DIAGNOSIS — L8990 Pressure ulcer of unspecified site, unspecified stage: Secondary | ICD-10-CM | POA: Insufficient documentation

## 2020-01-25 DIAGNOSIS — E440 Moderate protein-calorie malnutrition: Secondary | ICD-10-CM | POA: Insufficient documentation

## 2020-01-25 DIAGNOSIS — M7989 Other specified soft tissue disorders: Secondary | ICD-10-CM

## 2020-01-25 DIAGNOSIS — N171 Acute kidney failure with acute cortical necrosis: Secondary | ICD-10-CM

## 2020-01-25 DIAGNOSIS — N184 Chronic kidney disease, stage 4 (severe): Secondary | ICD-10-CM

## 2020-01-25 LAB — CBC
HCT: 26.6 % — ABNORMAL LOW (ref 39.0–52.0)
Hemoglobin: 8 g/dL — ABNORMAL LOW (ref 13.0–17.0)
MCH: 27.1 pg (ref 26.0–34.0)
MCHC: 30.1 g/dL (ref 30.0–36.0)
MCV: 90.2 fL (ref 80.0–100.0)
Platelets: 211 10*3/uL (ref 150–400)
RBC: 2.95 MIL/uL — ABNORMAL LOW (ref 4.22–5.81)
RDW: 17.7 % — ABNORMAL HIGH (ref 11.5–15.5)
WBC: 8.5 10*3/uL (ref 4.0–10.5)
nRBC: 0 % (ref 0.0–0.2)

## 2020-01-25 LAB — BRAIN NATRIURETIC PEPTIDE: B Natriuretic Peptide: 549 pg/mL — ABNORMAL HIGH (ref 0.0–100.0)

## 2020-01-25 LAB — RESPIRATORY PANEL BY PCR

## 2020-01-25 LAB — BASIC METABOLIC PANEL
Anion gap: 11 (ref 5–15)
BUN: 32 mg/dL — ABNORMAL HIGH (ref 8–23)
CO2: 28 mmol/L (ref 22–32)
Calcium: 8.8 mg/dL — ABNORMAL LOW (ref 8.9–10.3)
Chloride: 99 mmol/L (ref 98–111)
Creatinine, Ser: 2.99 mg/dL — ABNORMAL HIGH (ref 0.61–1.24)
GFR calc Af Amer: 22 mL/min — ABNORMAL LOW (ref >60)
GFR calc non Af Amer: 19 mL/min — ABNORMAL LOW (ref >60)
Glucose, Bld: 135 mg/dL — ABNORMAL HIGH (ref 70–99)
Potassium: 4.4 mmol/L (ref 3.5–5.1)
Sodium: 138 mmol/L (ref 135–145)

## 2020-01-25 LAB — TROPONIN I (HIGH SENSITIVITY): Troponin I (High Sensitivity): 941 ng/L (ref <18)

## 2020-01-25 LAB — LEGIONELLA PNEUMOPHILA SEROGP 1 UR AG: L. pneumophila Serogp 1 Ur Ag: NEGATIVE

## 2020-01-25 LAB — D-DIMER, QUANTITATIVE: D-Dimer, Quant: 2.12 ug/mL-FEU — ABNORMAL HIGH (ref 0.00–0.50)

## 2020-01-25 LAB — MAGNESIUM: Magnesium: 1.8 mg/dL (ref 1.7–2.4)

## 2020-01-25 LAB — T3: T3, Total: 71 ng/dL (ref 71–180)

## 2020-01-25 MED ORDER — ENSURE ENLIVE PO LIQD
237.0000 mL | Freq: Two times a day (BID) | ORAL | Status: DC
  Administered 2020-01-25 – 2020-01-30 (×9): 237 mL via ORAL

## 2020-01-25 MED ORDER — TECHNETIUM TO 99M ALBUMIN AGGREGATED
3.8000 | Freq: Once | INTRAVENOUS | Status: AC | PRN
  Administered 2020-01-25: 3.8 via INTRAVENOUS

## 2020-01-25 MED ORDER — IPRATROPIUM-ALBUTEROL 0.5-2.5 (3) MG/3ML IN SOLN
3.0000 mL | Freq: Two times a day (BID) | RESPIRATORY_TRACT | Status: DC
  Administered 2020-01-25 – 2020-01-26 (×3): 3 mL via RESPIRATORY_TRACT
  Filled 2020-01-25 (×3): qty 3

## 2020-01-25 MED ORDER — IPRATROPIUM-ALBUTEROL 0.5-2.5 (3) MG/3ML IN SOLN
3.0000 mL | Freq: Three times a day (TID) | RESPIRATORY_TRACT | Status: DC
  Administered 2020-01-25: 3 mL via RESPIRATORY_TRACT
  Filled 2020-01-25: qty 3

## 2020-01-25 NOTE — Progress Notes (Signed)
PROGRESS NOTE    Robert Little  NWG:[id number] DOB: 1941/04/22 DOA: 01/23/2020 PCP: [name]   Brief Narrative:  79 year old with history of pulmonary fibrosis, paroxysmal A. fib, diastolic CHF, CKD stage IV, anemia of chronic disease, COVID-19 pneumonia 2 months ago admitted for shortness of breath form SNF.  Prior to admission was treated with 5 days of empiric antibiotic for presumed pneumonia.  Upon admission he was quite hypoxic with concerns for CHF exacerbation versus pneumonia.   Assessment & Plan:   Principal Problem:   Acute on chronic respiratory failure with hypoxia (HCC) Active Problems:   Essential hypertension   Pulmonary fibrosis (HCC)   Schizo affective schizophrenia (HCC)   Sepsis ([place])   CKD (chronic kidney disease), stage IV (HCC)   Acute on chronic diastolic CHF (congestive heart failure) (HCC)   HCAP (healthcare-associated pneumonia)   NSTEMI (non-ST elevated myocardial infarction) (HCC)   Pressure injury of skin  Acute hypoxic respiratory failure, worsening now on 10 L high flow Acute congestive heart failure with preserved ejection fraction, 65% Presumed bilateral lower lobe pneumonia at admission -Echocardiogram 11/2019-EF 60 to 57%, grade 2 diastolic dysfunction -Procalcitonin is negative, will discontinue antibiotics. -Bronchodilators -Continue diuretics per cardiology -Wean off oxygen as deemed appropriate. -Check respiratory panel -Unable to get CTA chest due to renal dysfunction. Will obtain lower extremity Dopplers to rule out DVT. -Chest x-ray 7/23-shows extensive airspace opacity chronic interstitial lung disease  Elevated troponin, suspect demand ischemia -Suspect demand ischemia. Echocardiogram shows EF 60 to 65%, grade 2 DD  Atrial fibrillation, paroxysmal -Continue amiodarone.  Not on any home anticoagulation due to risk of fall  Renal insufficiency chronic kidney disease stage IV -Around baseline creatinine of 2.4.   Continue to monitor. Creatinine 2.9  Anemia of chronic disease -Hemoglobin appears to be stable without any evidence of bleeding -Continue aspirin, iron supplements.  GERD -PPI  History of COPD History of pulmonary fibrosis with chronic hypoxia on 2 L nasal cannula -Aggressive bronchodilators, incentive spirometer and flutter valve  History of schizoaffective disorder -Seroquel  BPH -Flomax   DVT prophylaxis: heparin injection 5,000 Units Start: 01/24/20 0600 Code Status: Full Family Communication:    Status is: Inpatient  Remains inpatient appropriate because:IV treatments appropriate due to intensity of illness or inability to take PO   Dispo: The patient is from: Home              Anticipated d/c is to: Home              Anticipated d/c date is: 2 days              Patient currently is not medically stable to d/c. Maintain patient in progressive care as he is requiring significant amount of oxygen.     Body mass index is 20.83 kg/m.  Pressure Injury 11/27/19 Ear Left Stage 2 -  Partial thickness loss of dermis presenting as a shallow open injury with a red, pink wound bed without slough. Red/pink (Active)  11/27/19 1350  Location: Ear  Location Orientation: Left  Staging: Stage 2 -  Partial thickness loss of dermis presenting as a shallow open injury with a red, pink wound bed without slough.  Wound Description (Comments): Red/pink  Present on Admission: No     Pressure Injury 01/24/20 Buttocks Mid Stage 1 -  Intact skin with non-blanchable redness of a localized area usually over a bony prominence. (Active)  01/24/20 2300  Location: Buttocks  Location Orientation: Mid  Staging:  Stage 1 -  Intact skin with non-blanchable redness of a localized area usually over a bony prominence.  Wound Description (Comments):   Present on Admission: Yes     Subjective: Overnight patient's oxygen was increased to 15 L high flow, while I was in the room I was able to wean  it down to 7 L high flow nasal cannula. Patient did have some exertional shortness of breath but felt slightly better compared to yesterday  Review of Systems Otherwise negative except as per HPI, including: General: Denies fever, chills, night sweats or unintended weight loss. Resp: Denies hemoptysis Cardiac: Denies chest pain, palpitations, orthopnea, paroxysmal nocturnal dyspnea. GI: Denies abdominal pain, nausea, vomiting, diarrhea or constipation GU: Denies dysuria, frequency, hesitancy or incontinence MS: Denies muscle aches, joint pain or swelling Neuro: Denies headache, neurologic deficits (focal weakness, numbness, tingling), abnormal gait Psych: Denies anxiety, depression, SI/HI/AVH Skin: Denies new rashes or lesions ID: Denies sick contacts, exotic exposures, travel  Examination:  Constitutional: Not in acute distress, 7 L high flow nasal cannula Respiratory: Bibasilar crackles Cardiovascular: Normal sinus rhythm, no rubs Abdomen: Nontender nondistended good bowel sounds Musculoskeletal: No edema noted Skin: No rashes seen Neurologic: CN 2-12 grossly intact.  And nonfocal Psychiatric: Normal judgment and insight. Alert and oriented x 3. Normal mood.  Objective: Vitals:   01/24/20 2251 01/25/20 0744 01/25/20 0816 01/25/20 0848  BP: (!) 149/51 (!) 99/50    Pulse: 87 66    Resp: 22     Temp: 98.4 F (36.9 C) 97.9 F (36.6 C)    TempSrc: Oral Oral    SpO2: 100% 100% 100% 94%  Weight:      Height:        Intake/Output Summary (Last 24 hours) at 01/25/2020 1033 Last data filed at 01/25/2020 0848 Gross per 24 hour  Intake 120 ml  Output 2075 ml  Net -1955 ml   Filed Weights   01/23/20 2240 01/24/20 1014  Weight: 62.6 kg 62.1 kg     Data Reviewed:   CBC: Recent Labs  Lab 01/23/20 2336 01/23/20 2343 01/24/20 0619 01/25/20 0728  WBC  --  9.0 8.3 8.5  NEUTROABS  --  7.3 5.1  --   HGB 9.5* 8.2* 7.3* 8.0*  HCT 28.0* 27.8* 25.2* 26.6*  MCV  --  93.6 95.1  90.2  PLT  --  198 177 086   Basic Metabolic Panel: Recent Labs  Lab 01/23/20 2336 01/23/20 2343 01/24/20 0619 01/25/20 0728  NA 140 139 140 138  K 4.4 4.5 4.8 4.4  CL  --  103 105 99  CO2  --  24 23 28   GLUCOSE  --  219* 121* 135*  BUN  --  26* 25* 32*  CREATININE  --  2.51* 2.47* 2.99*  CALCIUM  --  9.0 8.4* 8.8*  MG  --   --   --  1.8   GFR: Estimated Creatinine Clearance: 18.2 mL/min (A) (by C-G formula based on SCr of 2.99 mg/dL (H)). Liver Function Tests: Recent Labs  Lab 01/23/20 2343 01/24/20 0619  AST 14* 15  ALT 11 9  ALKPHOS 86 67  BILITOT 0.7 1.0  PROT 7.7 6.0*  ALBUMIN 3.0* 2.5*   No results for input(s): LIPASE, AMYLASE in the last 168 hours. No results for input(s): AMMONIA in the last 168 hours. Coagulation Profile: Recent Labs  Lab 01/24/20 0049  INR 1.1   Cardiac Enzymes: Recent Labs  Lab 01/24/20 0619  CKTOTAL 129  BNP (last 3 results) No results for input(s): PROBNP in the last 8760 hours. HbA1C: No results for input(s): HGBA1C in the last 72 hours. CBG: No results for input(s): GLUCAP in the last 168 hours. Lipid Profile: No results for input(s): CHOL, HDL, LDLCALC, TRIG, CHOLHDL, LDLDIRECT in the last 72 hours. Thyroid Function Tests: Recent Labs    01/24/20 0619 01/24/20 1553  TSH 5.133*  --   FREET4  --  0.84   Anemia Panel: No results for input(s): VITAMINB12, FOLATE, FERRITIN, TIBC, IRON, RETICCTPCT in the last 72 hours. Sepsis Labs: Recent Labs  Lab 01/23/20 2343 01/24/20 0619 01/24/20 0833  PROCALCITON  --   --  <0.10  LATICACIDVEN 2.1* 1.8 2.0*    Recent Results (from the past 240 hour(s))  Culture, blood (routine x 2)     Status: None (Preliminary result)   Collection Time: 01/23/20 11:45 PM   Specimen: BLOOD  Result Value Ref Range Status   Specimen Description BLOOD RIGHT ANTECUBITAL  Final   Special Requests   Final    BOTTLES DRAWN AEROBIC AND ANAEROBIC Blood Culture adequate volume   Culture    Final    NO GROWTH 1 DAY Performed at [facility], [address], [place], [place] [address]    Report Status PENDING  Incomplete  Culture, blood (routine x 2)     Status: None (Preliminary result)   Collection Time: 01/23/20 11:47 PM   Specimen: BLOOD  Result Value Ref Range Status   Specimen Description BLOOD LEFT ANTECUBITAL  Final   Special Requests   Final    BOTTLES DRAWN AEROBIC AND ANAEROBIC Blood Culture adequate volume   Culture   Final    NO GROWTH 1 DAY Performed at [facility], [place] [address], [place], [place] [address]    Report Status PENDING  Incomplete  Urine culture     Status: None   Collection Time: 01/24/20  2:58 AM   Specimen: Urine, Random  Result Value Ref Range Status   Specimen Description URINE, RANDOM  Final   Special Requests NONE  Final   Culture   Final    NO GROWTH Performed at [facility], [place] [address], [place], [place] [address]    Report Status 01/24/2020 FINAL  Final         Radiology Studies: DG Chest Port 1 View  Result Date: 01/25/2020 CLINICAL DATA:  Dyspnea. EXAM: PORTABLE CHEST 1 VIEW COMPARISON:  Prior chest radiograph 01/23/2020 and earlier, chest CT 11/19/2019 FINDINGS: Prior median sternotomy and CABG. Redemonstrated fractures within multiple sternotomy wires. Heart size at the upper limits of normal, unchanged. Similar appearance of extensive bilateral airspace opacities superimposed upon a background of known chronic interstitial lung disease. No evidence of pneumothorax. Trace bilateral pleural effusions are difficult to exclude. No acute bony abnormality identified IMPRESSION: No significant change as compared to the chest radiograph 01/23/2020. Redemonstrated extensive airspace opacities superimposed upon a background of chronic interstitial lung disease. Trace bilateral pleural effusions are difficult to exclude. Electronically Signed   By: Kellie Simmering DO   On: 01/25/2020 08:01   DG Chest Port 1  View  Result Date: 01/23/2020 CLINICAL DATA:  Dyspnea EXAM: PORTABLE CHEST 1 VIEW COMPARISON:  11/20/2019, CT 09/19/2019 FINDINGS: Lung volumes are small, but are symmetric. Moderate mid and lower lung zone interstitial pulmonary infiltrate has developed,, significantly progressed since prior examination. Given the findings on prior CT examination, this may represent progression of the patient's underlying  interstitial lung disease, or more likely, superimposed progressive pulmonary edema or atypical infection. Cavitary lesion within the right mid lung zone represents a pneumatocele noted on a prior CT exam in this region. No pneumothorax or pleural effusion. Coronary artery bypass grafting has been performed. Cardiac size is within normal limits. No acute bone abnormality. IMPRESSION: Interval development of extensive mid and lower lung zone pulmonary infiltrates superimposed upon axis ting chronic interstitial changes. Of the various considerations listed above, superimposed pulmonary edema appears most likely. Pulmonary hypoinflation. Electronically Signed   By: Fidela Salisbury MD   On: 01/23/2020 23:24   ECHOCARDIOGRAM COMPLETE  Result Date: 01/24/2020    ECHOCARDIOGRAM REPORT   Patient Name:   Robert Little Date of Exam: 01/24/2020 Medical Rec #:  [id number]    Height:       68.0 in Accession #:    [id number]   Weight:       138.0 lb Date of Birth:  1941/04/22    BSA:          1.746 m Patient Age:    71 years     BP:           115/47 mmHg Patient Gender: M            HR:           79 bpm. Exam Location:  Inpatient Procedure: 2D Echo and Intracardiac Opacification Agent Indications:    Elevated Troponin  History:        Patient has prior history of Echocardiogram examinations, most                 recent 11/14/2019. CAD, Prior CABG; Risk Factors:Hypertension.                 Pulmonary fibrosis, paroxysmal atrial fibrillation, diastolic                 CHF, chronic kidney disease stage IV, chronic anemia. Covid                  pneumonia in may.  Sonographer:    Darlina Sicilian RDCS Referring Phys: [place]  1. Left ventricular ejection fraction, by estimation, is 60 to 65%. The left ventricle has normal function. The left ventricle has no regional wall motion abnormalities. There is mild left ventricular hypertrophy. Left ventricular diastolic parameters are consistent with Grade II diastolic dysfunction (pseudonormalization). Elevated left atrial pressure.  2. Right ventricular systolic function is normal. The right ventricular size is mildly enlarged. Tricuspid regurgitation signal is inadequate for assessing PA pressure.  3. The mitral valve is normal in structure. Mild mitral valve regurgitation.  4. The aortic valve is tricuspid. Aortic valve regurgitation is not visualized. Mild to moderate aortic valve sclerosis/calcification is present, without any evidence of aortic stenosis. FINDINGS  Left Ventricle: Left ventricular ejection fraction, by estimation, is 60 to 65%. The left ventricle has normal function. The left ventricle has no regional wall motion abnormalities. Definity contrast agent was given IV to delineate the left ventricular  endocardial borders. The left ventricular internal cavity size was normal in size. There is mild left ventricular hypertrophy. Left ventricular diastolic parameters are consistent with Grade II diastolic dysfunction (pseudonormalization). Elevated left atrial pressure. Right Ventricle: The right ventricular size is mildly enlarged. Right vetricular wall thickness was not assessed. Right ventricular systolic function is normal. Tricuspid regurgitation signal is inadequate for assessing PA pressure. Left Atrium: Left atrial size was normal in size. Right  Atrium: Right atrial size was not well visualized. Pericardium: Trivial pericardial effusion is present. Mitral Valve: The mitral valve is normal in structure. Mild mitral valve regurgitation. Tricuspid  Valve: The tricuspid valve is normal in structure. Tricuspid valve regurgitation is mild. Aortic Valve: The aortic valve is tricuspid. Aortic valve regurgitation is not visualized. Mild to moderate aortic valve sclerosis/calcification is present, without any evidence of aortic stenosis. Pulmonic Valve: The pulmonic valve was not well visualized. Pulmonic valve regurgitation is not visualized. Aorta: The aortic root is normal in size and structure. IAS/Shunts: The interatrial septum was not well visualized.  LEFT VENTRICLE PLAX 2D LVIDd:         4.00 cm     Diastology LVIDs:         2.90 cm     LV e' lateral:   6.53 cm/s LV PW:         1.10 cm     LV E/e' lateral: 15.9 LV IVS:        1.10 cm     LV e' medial:    5.77 cm/s LVOT diam:     1.60 cm     LV E/e' medial:  18.0 LV SV:         35 LV SV Index:   20 LVOT Area:     2.01 cm  LV Volumes (MOD) LV vol d, MOD A2C: 94.5 ml LV vol d, MOD A4C: 98.0 ml LV vol s, MOD A2C: 64.1 ml LV vol s, MOD A4C: 33.0 ml LV SV MOD A2C:     30.4 ml LV SV MOD A4C:     98.0 ml LV SV MOD BP:      54.2 ml RIGHT VENTRICLE RV S prime:     12.90 cm/s TAPSE (M-mode): 1.6 cm LEFT ATRIUM             Index LA diam:        4.40 cm 2.52 cm/m LA Vol (A2C):   45.0 ml 25.78 ml/m LA Vol (A4C):   56.8 ml 32.54 ml/m LA Biplane Vol: 54.6 ml 31.28 ml/m  AORTIC VALVE LVOT Vmax:   96.10 cm/s LVOT Vmean:  56.000 cm/s LVOT VTI:    0.173 m  AORTA Ao Root diam: 3.20 cm MITRAL VALVE MV Area (PHT): 4.49 cm     SHUNTS MV Decel Time: 169 msec     Systemic VTI:  0.17 m MV E velocity: 104.00 cm/s  Systemic Diam: 1.60 cm MV A velocity: 78.60 cm/s MV E/A ratio:  1.32 Oswaldo Milian MD Electronically signed by Oswaldo Milian MD Signature Date/Time: 01/24/2020/2:01:53 PM    Final         Scheduled Meds: . aspirin EC  81 mg Oral Daily  . atorvastatin  20 mg Oral QHS  . clonazePAM  0.5 mg Oral BID  . ferrous sulfate  325 mg Oral BID WC  . fluticasone furoate-vilanterol  1 puff Inhalation Daily  .  guaiFENesin  600 mg Oral BID  . heparin  5,000 Units Subcutaneous Q8H  . ipratropium-albuterol  3 mL Nebulization TID  . magnesium oxide  400 mg Oral Daily  . multivitamin with minerals  1 tablet Oral Daily  . pantoprazole  40 mg Oral Daily  . QUEtiapine  25 mg Oral QHS  . senna-docusate  2 tablet Oral QHS  . tamsulosin  0.8 mg Oral QPC supper  . umeclidinium bromide  1 puff Inhalation Daily  . vitamin B-12  500 mcg Oral BID  Continuous Infusions:    LOS: 1 day   Time spent= 35 mins    Lymon Kidney Arsenio Loader, MD [facility]  If 7PM-7AM, please contact night-coverage  01/25/2020, 10:33 AM

## 2020-01-25 NOTE — Progress Notes (Addendum)
Progress Note  Patient Name: Robert Little Date of Encounter: 01/25/2020  Primary Cardiologist: Sanda Klein, MD   Subjective   Pt resting this AM. No specific complaints   Inpatient Medications    Scheduled Meds: . aspirin EC  81 mg Oral Daily  . atorvastatin  20 mg Oral QHS  . clonazePAM  0.5 mg Oral BID  . ferrous sulfate  325 mg Oral BID WC  . fluticasone furoate-vilanterol  1 puff Inhalation Daily  . guaiFENesin  600 mg Oral BID  . heparin  5,000 Units Subcutaneous Q8H  . ipratropium-albuterol  3 mL Nebulization TID  . magnesium oxide  400 mg Oral Daily  . multivitamin with minerals  1 tablet Oral Daily  . pantoprazole  40 mg Oral Daily  . QUEtiapine  25 mg Oral QHS  . senna-docusate  2 tablet Oral QHS  . tamsulosin  0.8 mg Oral QPC supper  . umeclidinium bromide  1 puff Inhalation Daily  . vitamin B-12  500 mcg Oral BID   Continuous Infusions:  PRN Meds: acetaminophen, albuterol, ondansetron **OR** ondansetron (ZOFRAN) IV, polyethylene glycol   Vital Signs    Vitals:   01/24/20 2214 01/24/20 2251 01/25/20 0744 01/25/20 0816  BP: (!) 113/58 (!) 149/51 (!) 99/50   Pulse: 83 87 66   Resp: 22 22    Temp: 98.5 F (36.9 C) 98.4 F (36.9 C) 97.9 F (36.6 C)   TempSrc: Oral Oral Oral   SpO2: 100% 100% 100% 100%  Weight:      Height:        Intake/Output Summary (Last 24 hours) at 01/25/2020 1012 Last data filed at 01/25/2020 0000 Gross per 24 hour  Intake --  Output 2025 ml  Net -2025 ml   Filed Weights   01/23/20 2240 01/24/20 1014  Weight: 62.6 kg 62.1 kg    Physical Exam   General: Elderly, frail, NAD Neck: Negative for carotid bruits. No JVD Lungs: Diminished in bilateral upper lobes. Breathing is unlabored. Cardiovascular: RRR with S1 S2. No murmurs Abdomen: Soft, non-tender, non-distended. No obvious abdominal masses. Extremities: No edema. Radial pulses 2+ bilaterally Neuro: Alert and oriented. No focal deficits. No facial asymmetry.  MAE spontaneously. Psych: Responds to questions appropriately with normal affect.    Labs    Chemistry Recent Labs  Lab 01/23/20 2343 01/24/20 0619 01/25/20 0728  NA 139 140 138  K 4.5 4.8 4.4  CL 103 105 99  CO2 24 23 28   GLUCOSE 219* 121* 135*  BUN 26* 25* 32*  CREATININE 2.51* 2.47* 2.99*  CALCIUM 9.0 8.4* 8.8*  PROT 7.7 6.0*  --   ALBUMIN 3.0* 2.5*  --   AST 14* 15  --   ALT 11 9  --   ALKPHOS 86 67  --   BILITOT 0.7 1.0  --   GFRNONAA 24* 24* 19*  GFRAA 28* 28* 22*  ANIONGAP 12 12 11      Hematology Recent Labs  Lab 01/23/20 2343 01/24/20 0619 01/25/20 0728  WBC 9.0 8.3 8.5  RBC 2.97* 2.65* 2.95*  HGB 8.2* 7.3* 8.0*  HCT 27.8* 25.2* 26.6*  MCV 93.6 95.1 90.2  MCH 27.6 27.5 27.1  MCHC 29.5* 29.0* 30.1  RDW 17.8* 18.1* 17.7*  PLT 198 177 211    Cardiac EnzymesNo results for input(s): TROPONINI in the last 168 hours. No results for input(s): TROPIPOC in the last 168 hours.   BNP Recent Labs  Lab 01/23/20 2343 01/25/20 5176  BNP 836.2* 549.0*     DDimer  Recent Labs  Lab 01/25/20 0746  DDIMER 2.12*     Radiology    DG Chest Port 1 View  Result Date: 01/25/2020 CLINICAL DATA:  Dyspnea. EXAM: PORTABLE CHEST 1 VIEW COMPARISON:  Prior chest radiograph 01/23/2020 and earlier, chest CT 11/19/2019 FINDINGS: Prior median sternotomy and CABG. Redemonstrated fractures within multiple sternotomy wires. Heart size at the upper limits of normal, unchanged. Similar appearance of extensive bilateral airspace opacities superimposed upon a background of known chronic interstitial lung disease. No evidence of pneumothorax. Trace bilateral pleural effusions are difficult to exclude. No acute bony abnormality identified IMPRESSION: No significant change as compared to the chest radiograph 01/23/2020. Redemonstrated extensive airspace opacities superimposed upon a background of chronic interstitial lung disease. Trace bilateral pleural effusions are difficult to  exclude. Electronically Signed   By: Kellie Simmering DO   On: 01/25/2020 08:01   DG Chest Port 1 View  Result Date: 01/23/2020 CLINICAL DATA:  Dyspnea EXAM: PORTABLE CHEST 1 VIEW COMPARISON:  11/20/2019, CT 09/19/2019 FINDINGS: Lung volumes are small, but are symmetric. Moderate mid and lower lung zone interstitial pulmonary infiltrate has developed,, significantly progressed since prior examination. Given the findings on prior CT examination, this may represent progression of the patient's underlying interstitial lung disease, or more likely, superimposed progressive pulmonary edema or atypical infection. Cavitary lesion within the right mid lung zone represents a pneumatocele noted on a prior CT exam in this region. No pneumothorax or pleural effusion. Coronary artery bypass grafting has been performed. Cardiac size is within normal limits. No acute bone abnormality. IMPRESSION: Interval development of extensive mid and lower lung zone pulmonary infiltrates superimposed upon axis ting chronic interstitial changes. Of the various considerations listed above, superimposed pulmonary edema appears most likely. Pulmonary hypoinflation. Electronically Signed   By: Fidela Salisbury MD   On: 01/23/2020 23:24   ECHOCARDIOGRAM COMPLETE  Result Date: 01/24/2020    ECHOCARDIOGRAM REPORT   Patient Name:   Robert Little Date of Exam: 01/24/2020 Medical Rec #:  [id number]    Height:       68.0 in Accession #:    [id number]   Weight:       138.0 lb Date of Birth:  September 08, 1940    BSA:          1.746 m Patient Age:    79 years     BP:           115/47 mmHg Patient Gender: M            HR:           79 bpm. Exam Location:  Inpatient Procedure: 2D Echo and Intracardiac Opacification Agent Indications:    Elevated Troponin  History:        Patient has prior history of Echocardiogram examinations, most                 recent 11/14/2019. CAD, Prior CABG; Risk Factors:Hypertension.                 Pulmonary fibrosis, paroxysmal atrial  fibrillation, diastolic                 CHF, chronic kidney disease stage IV, chronic anemia. Covid                 pneumonia in may.  Sonographer:    Darlina Sicilian RDCS Referring Phys: [place]  1. Left ventricular ejection fraction, by estimation,  is 60 to 65%. The left ventricle has normal function. The left ventricle has no regional wall motion abnormalities. There is mild left ventricular hypertrophy. Left ventricular diastolic parameters are consistent with Grade II diastolic dysfunction (pseudonormalization). Elevated left atrial pressure.  2. Right ventricular systolic function is normal. The right ventricular size is mildly enlarged. Tricuspid regurgitation signal is inadequate for assessing PA pressure.  3. The mitral valve is normal in structure. Mild mitral valve regurgitation.  4. The aortic valve is tricuspid. Aortic valve regurgitation is not visualized. Mild to moderate aortic valve sclerosis/calcification is present, without any evidence of aortic stenosis. FINDINGS  Left Ventricle: Left ventricular ejection fraction, by estimation, is 60 to 65%. The left ventricle has normal function. The left ventricle has no regional wall motion abnormalities. Definity contrast agent was given IV to delineate the left ventricular  endocardial borders. The left ventricular internal cavity size was normal in size. There is mild left ventricular hypertrophy. Left ventricular diastolic parameters are consistent with Grade II diastolic dysfunction (pseudonormalization). Elevated left atrial pressure. Right Ventricle: The right ventricular size is mildly enlarged. Right vetricular wall thickness was not assessed. Right ventricular systolic function is normal. Tricuspid regurgitation signal is inadequate for assessing PA pressure. Left Atrium: Left atrial size was normal in size. Right Atrium: Right atrial size was not well visualized. Pericardium: Trivial pericardial effusion is present.  Mitral Valve: The mitral valve is normal in structure. Mild mitral valve regurgitation. Tricuspid Valve: The tricuspid valve is normal in structure. Tricuspid valve regurgitation is mild. Aortic Valve: The aortic valve is tricuspid. Aortic valve regurgitation is not visualized. Mild to moderate aortic valve sclerosis/calcification is present, without any evidence of aortic stenosis. Pulmonic Valve: The pulmonic valve was not well visualized. Pulmonic valve regurgitation is not visualized. Aorta: The aortic root is normal in size and structure. IAS/Shunts: The interatrial septum was not well visualized.  LEFT VENTRICLE PLAX 2D LVIDd:         4.00 cm     Diastology LVIDs:         2.90 cm     LV e' lateral:   6.53 cm/s LV PW:         1.10 cm     LV E/e' lateral: 15.9 LV IVS:        1.10 cm     LV e' medial:    5.77 cm/s LVOT diam:     1.60 cm     LV E/e' medial:  18.0 LV SV:         35 LV SV Index:   20 LVOT Area:     2.01 cm  LV Volumes (MOD) LV vol d, MOD A2C: 94.5 ml LV vol d, MOD A4C: 98.0 ml LV vol s, MOD A2C: 64.1 ml LV vol s, MOD A4C: 33.0 ml LV SV MOD A2C:     30.4 ml LV SV MOD A4C:     98.0 ml LV SV MOD BP:      54.2 ml RIGHT VENTRICLE RV S prime:     12.90 cm/s TAPSE (M-mode): 1.6 cm LEFT ATRIUM             Index LA diam:        4.40 cm 2.52 cm/m LA Vol (A2C):   45.0 ml 25.78 ml/m LA Vol (A4C):   56.8 ml 32.54 ml/m LA Biplane Vol: 54.6 ml 31.28 ml/m  AORTIC VALVE LVOT Vmax:   96.10 cm/s LVOT Vmean:  56.000 cm/s LVOT VTI:  0.173 m  AORTA Ao Root diam: 3.20 cm MITRAL VALVE MV Area (PHT): 4.49 cm     SHUNTS MV Decel Time: 169 msec     Systemic VTI:  0.17 m MV E velocity: 104.00 cm/s  Systemic Diam: 1.60 cm MV A velocity: 78.60 cm/s MV E/A ratio:  1.32 Oswaldo Milian MD Electronically signed by Oswaldo Milian MD Signature Date/Time: 01/24/2020/2:01:53 PM    Final    Telemetry    01/25/20 NSR HR 60-80's- Personally Reviewed  ECG    No new tracing as of 01/25/20 - Personally  Reviewed  Cardiac Studies   2D echo 01/24/20  1. Left ventricular ejection fraction, by estimation, is 60 to 65%. The  left ventricle has normal function. The left ventricle has no regional  wall motion abnormalities. There is mild left ventricular hypertrophy.  Left ventricular diastolic parameters  are consistent with Grade II diastolic dysfunction (pseudonormalization).  Elevated left atrial pressure.  2. Right ventricular systolic function is normal. The right ventricular  size is mildly enlarged. Tricuspid regurgitation signal is inadequate for  assessing PA pressure.  3. The mitral valve is normal in structure. Mild mitral valve  regurgitation.  4. The aortic valve is tricuspid. Aortic valve regurgitation is not  visualized. Mild to moderate aortic valve sclerosis/calcification is  present, without any evidence of aortic stenosis.   Patient Profile     79 y.o. male with a hx of CAD s/p prior CABG([facility] 02/2004),DM2,HTN, HLD, pulmonary fibrosis with chronic respiratory failure on home O2, Covid-19 PNA 11/2019, schizoaffective disorderin a group home, CKD IV, anemia of chronic disease, GERD, chronic diastolic CHF, lung nodule, extensive tobacco history, dysphagia, chronic appearing anemia who is being seen today for the evaluation of elevated troponin/CHF at the request of Dr. Reesa Chew.  Assessment & Plan    1. Acute on chronic hypoxic respiratory failure: -Pt has multifactorial underlying pulmonary fibrosis, a/c diastolic CHF, and h/o [id number] PNA in 11/2019 -Elevated D-dimer on labs today at 2.12>>>will let primary team know -Given poor/worseing renal status>>likely will need V/Q scan r/o PE    2. Acute on chronic diastolic CHF -LVEF preserved as above -BNP found to be 549 -I&O, net positive 2.4L  -Weight, 137lb   3. Elevated troponin in the context of known CAD s/p CABG 2005 -hsTroponins [phone]>>higher than expected for demand ischemia   -Heparin held in the setting of worsening anemia>>Hb today  -Reassuring echo  -Continue ASA, statin (LDL 41 in 09/2019)  4. CKD stage IV -Creatinine, 2.99 today  -Baseline 2-3 recently -Monitor closely in context of the above  5. Acute on chronic anemia -Baseline Hgb has been variable from 6-9 range, 7.3 yesterday and 8.0 today   6. Abnormal TSH  -Amio discontinued  -T4, 0.84  7. Paroxysmal atrial fibrillation, diagnosed 11/2019 in context of Covid -Maintaining NSR -Not on anticoagulation due to anemia and fall risk  Signed, Kathyrn Drown NP-C [facility] Pager: [phone] 01/25/2020, 10:12 AM     For questions or updates, please contact   Please consult [email or web address] for contact info under Cardiology/STEMI.  The patient was seen, examined and discussed with Kathyrn Drown, NP  and I agree with the above.   Acute on chronic respiratory failure with hypoxia Possible underlying pneumonia superimposed on pulmonary fibrosis, Acute on chronic diastolic CHF Demand ischemia Acute on chronic kidney failure Chronic anemia of chronic diseases Paroxysmal atrial fibrillation Elevated D Dimer  Hold diuretics, as he appears euvolemic and crea up 2.47->2.99, negative 2 L overnight, monitor creatinine  weights I's and O's closely, we discontinued amiodarone in the settings of interstitial fibrosis and new worsening interstitial findings. Elevated D-dimer on labs today at 2.12>>> order V/Q scan r/o PE   I would definitely recommend to involve pulmonary for further evaluation and recommendation.  TSH borderline.  Troponin elevation represents demand ischemia in the settings of acute heart failure and hypoxic respiratory failure, heparin held sec to anemia and flat troponin trend,  LVEF on the echo yesterday 60 to 65% without any new wall motion abnormalities.  Ena Dawley, MD 01/25/2020

## 2020-01-25 NOTE — Progress Notes (Signed)
Lower extremity venous bilateral study completed.   See Cv Proc for preliminary results.   Dianah Pruett  

## 2020-01-25 NOTE — Progress Notes (Signed)
Initial Nutrition Assessment  DOCUMENTATION CODES:   Non-severe (moderate) malnutrition in context of chronic illness  INTERVENTION:   - Continue MVI with minerals daily  - Ensure Enlive po BID, each supplement provides 350 kcal and 20 grams of protein  - Encourage adequate PO intake  NUTRITION DIAGNOSIS:   Moderate Malnutrition related to chronic illness (CHF, CKD stage IV) as evidenced by mild fat depletion, moderate fat depletion, mild muscle depletion, percent weight loss (10.5% weight loss in less than 3 months).  GOAL:   Patient will meet greater than or equal to 90% of their needs  MONITOR:   PO intake, Supplement acceptance, Labs, Weight trends, Skin, I & O's  REASON FOR ASSESSMENT:   Malnutrition Screening Tool    ASSESSMENT:   Robert Little is a 79 y.o. male with history of pulmonary fibrosis, paroxysmal atrial fibrillation, diastolic CHF, chronic kidney disease stage IV, chronic anemia who was admitted 2 months ago for Covid pneumonia with sepsis was brought to the ER from the skilled nursing facility after patient was getting more short of breath.  Spoke with pt at bedside. Pt requesting assistance in setting up lunch meal tray. RD assisted pt in repositioning himself in bed and cutting up meatloaf. Pt states that he is hungry but that earlier he didn't have much of an appetite. Overall, pt is a poor historian. He believes he has lost weight but is unsure of his UBW or how much weight he has lost. He states that he has been told, "don't lose any more weight." Pt is willing to consume an oral nutrition supplement during admission to aid in meeting kcal and protein needs.  Reviewed weight history in chart. Pt with a 7.3 kg weight loss since 11/13/19. This is a 10.5% weight loss in less than 3 months which is significant for timeframe.  Medications reviewed and include: ferrous sulfate, magnesium oxide, MVI with minerals, protonix, senna, vitamin B-12  Labs reviewed:  hemoglobin 8.0  UOP: 2375 ml x 24 hours  NUTRITION - FOCUSED PHYSICAL EXAM:    Most Recent Value  Orbital Region Mild depletion  Upper Arm Region Moderate depletion  Thoracic and Lumbar Region Mild depletion  Buccal Region No depletion  Temple Region Mild depletion  Clavicle Bone Region Mild depletion  Clavicle and Acromion Bone Region Mild depletion  Scapular Bone Region Unable to assess  Dorsal Hand Unable to assess  Patellar Region Unable to assess  Anterior Thigh Region Unable to assess  Posterior Calf Region Unable to assess  Edema (RD Assessment) Unable to assess  Hair Reviewed  Eyes Reviewed  Mouth Reviewed  Skin Reviewed  Nails Reviewed       Diet Order:   Diet Order            Diet Heart Room service appropriate? Yes; Fluid consistency: Thin; Fluid restriction: 1200 mL Fluid  Diet effective now                 EDUCATION NEEDS:   Not appropriate for education at this time  Skin:  Skin Assessment: Skin Integrity Issues: Stage I: buttocks  Last BM:  no documented BM  Height:   Ht Readings from Last 1 Encounters:  01/24/20 5\' 8"  (1.727 m)    Weight:   Wt Readings from Last 1 Encounters:  01/24/20 62.1 kg    Ideal Body Weight:  70 kg  BMI:  Body mass index is 20.83 kg/m.  Estimated Nutritional Needs:   Kcal:  2595-6387  Protein:  80-95 grams  Fluid:  1.5 L/day    Gaynell Face, MS, RD, LDN Inpatient Clinical Dietitian Please see AMiON for contact information.

## 2020-01-26 ENCOUNTER — Inpatient Hospital Stay (HOSPITAL_COMMUNITY): Payer: Medicare Other

## 2020-01-26 DIAGNOSIS — I480 Paroxysmal atrial fibrillation: Secondary | ICD-10-CM

## 2020-01-26 DIAGNOSIS — R778 Other specified abnormalities of plasma proteins: Secondary | ICD-10-CM

## 2020-01-26 DIAGNOSIS — N184 Chronic kidney disease, stage 4 (severe): Secondary | ICD-10-CM

## 2020-01-26 DIAGNOSIS — J9621 Acute and chronic respiratory failure with hypoxia: Principal | ICD-10-CM

## 2020-01-26 DIAGNOSIS — I5033 Acute on chronic diastolic (congestive) heart failure: Secondary | ICD-10-CM

## 2020-01-26 DIAGNOSIS — N171 Acute kidney failure with acute cortical necrosis: Secondary | ICD-10-CM

## 2020-01-26 LAB — CBC
HCT: 24.1 % — ABNORMAL LOW (ref 39.0–52.0)
Hemoglobin: 7.5 g/dL — ABNORMAL LOW (ref 13.0–17.0)
MCH: 27.8 pg (ref 26.0–34.0)
MCHC: 31.1 g/dL (ref 30.0–36.0)
MCV: 89.3 fL (ref 80.0–100.0)
Platelets: 223 10*3/uL (ref 150–400)
RBC: 2.7 MIL/uL — ABNORMAL LOW (ref 4.22–5.81)
RDW: 17.5 % — ABNORMAL HIGH (ref 11.5–15.5)
WBC: 8.2 10*3/uL (ref 4.0–10.5)
nRBC: 0 % (ref 0.0–0.2)

## 2020-01-26 LAB — BASIC METABOLIC PANEL
Anion gap: 12 (ref 5–15)
BUN: 38 mg/dL — ABNORMAL HIGH (ref 8–23)
CO2: 27 mmol/L (ref 22–32)
Calcium: 8.7 mg/dL — ABNORMAL LOW (ref 8.9–10.3)
Chloride: 99 mmol/L (ref 98–111)
Creatinine, Ser: 2.98 mg/dL — ABNORMAL HIGH (ref 0.61–1.24)
GFR calc Af Amer: 22 mL/min — ABNORMAL LOW (ref >60)
GFR calc non Af Amer: 19 mL/min — ABNORMAL LOW (ref >60)
Glucose, Bld: 121 mg/dL — ABNORMAL HIGH (ref 70–99)
Potassium: 4.2 mmol/L (ref 3.5–5.1)
Sodium: 138 mmol/L (ref 135–145)

## 2020-01-26 LAB — MAGNESIUM: Magnesium: 1.9 mg/dL (ref 1.7–2.4)

## 2020-01-26 MED ORDER — FUROSEMIDE 10 MG/ML IJ SOLN
40.0000 mg | Freq: Once | INTRAMUSCULAR | Status: AC
  Administered 2020-01-26: 40 mg via INTRAVENOUS
  Filled 2020-01-26: qty 4

## 2020-01-26 MED ORDER — METHYLPREDNISOLONE SODIUM SUCC 125 MG IJ SOLR
60.0000 mg | Freq: Two times a day (BID) | INTRAMUSCULAR | Status: DC
  Administered 2020-01-27 – 2020-01-28 (×3): 60 mg via INTRAVENOUS
  Filled 2020-01-26 (×3): qty 2

## 2020-01-26 MED ORDER — METHYLPREDNISOLONE SODIUM SUCC 40 MG IJ SOLR
40.0000 mg | Freq: Three times a day (TID) | INTRAMUSCULAR | Status: DC
  Administered 2020-01-26: 40 mg via INTRAVENOUS
  Filled 2020-01-26: qty 1

## 2020-01-26 NOTE — Progress Notes (Signed)
PROGRESS NOTE    Dontray Haberland  IRS:[id number] DOB: [birth year] DOA: 01/23/2020 PCP: [facility], [name]   Brief Narrative:  79 year old with history of pulmonary fibrosis, paroxysmal A. fib, diastolic CHF, CKD stage IV, anemia of chronic disease, COVID-19 pneumonia 2 months ago admitted for shortness of breath form SNF.  Prior to admission was treated with 5 days of empiric antibiotic for presumed pneumonia.  Upon admission he was quite hypoxic with concerns for CHF exacerbation versus pneumonia.   Assessment & Plan:   Principal Problem:   Acute on chronic respiratory failure with hypoxia (HCC) Active Problems:   Essential hypertension   Pulmonary fibrosis (HCC)   Schizo affective schizophrenia ([place])   CKD (chronic kidney disease), stage IV (HCC)   Acute on chronic diastolic CHF (congestive heart failure) (HCC)   HCAP (healthcare-associated pneumonia)   Pressure injury of skin   Acute renal failure with acute renal cortical necrosis superimposed on stage 4 chronic kidney disease (HCC)   Malnutrition of moderate degree   Elevated troponin   Paroxysmal atrial fibrillation (HCC)  Acute hypoxic respiratory failure,worsening on 15 L high flow Acute congestive heart failure with preserved ejection fraction, 65% Presumed bilateral lower lobe pneumonia at admission -Wondering progression of fibrosis, he is wheezing somewhat this morning.  Solu-Medrol started along with aggressive bronchodilators -If he tolerates will obtain CT chest without contrast -Consulted pulmonary for their input -1 dose of Lasix IV today. -Echocardiogram 11/2019-EF 60 to 00%, grade 2 diastolic dysfunction -Procalcitonin is negative, will discontinue antibiotics. -Appreciate input from cardiology -Wean off oxygen as deemed appropriate. -Respiratory viral panel-negative -Lower extremity Dopplers-negative.  VQ scan-some mismatch defect secondary to fibrosis but no evidence of filling defect concerning for  PE. -Chest x-ray 7/23-shows extensive airspace opacity chronic interstitial lung disease  Elevated troponin, suspect demand ischemia -Demand ischemia but can proceed with further work-up in the future once his respiratory status is improved. Echocardiogram shows EF 60 to 65%, grade 2 DD  Atrial fibrillation, paroxysmal -For now amiodarone has been discontinued -No anticoagulation due to risk of falls  Renal insufficiency chronic kidney disease stage IV -Around baseline creatinine of 2.4.  Continue to monitor. Creatinine 2.98  Anemia of chronic disease -Hemoglobin appears to be stable without any evidence of bleeding -Continue aspirin, iron supplements. Will transfuse as necessary  GERD -PPI  History of COPD History of pulmonary fibrosis with chronic hypoxia on 2 L nasal cannula -Aggressive bronchodilators, incentive spirometer and flutter valve.  History of schizoaffective disorder -Seroquel  BPH -Flomax  Goals of care, discussed with the Care giver. He understands patient is ill at this time. Wishes patient to be Full Code but he is open to further discussion depending on his progression.   DVT prophylaxis: heparin injection 5,000 Units Start: 01/24/20 0600 Code Status: Full Family Communication:  Spoke with [name] (Care giver, legal guardian).   Status is: Inpatient  Remains inpatient appropriate because:IV treatments appropriate due to intensity of illness or inability to take PO  Dispo: The patient is from: Home              Anticipated d/c is to: Home              Anticipated d/c date is: 2 days              Patient currently is not medically stable to d/c.  Still requiring significant amount of oxygen.  Body mass index is 24.47 kg/m.  Pressure Injury 11/27/19 Ear Left Stage 2 -  Partial thickness loss of dermis presenting as a shallow open injury with a red, pink wound bed without slough. Red/pink (Active)  11/27/19 1350  Location: Ear  Location  Orientation: Left  Staging: Stage 2 -  Partial thickness loss of dermis presenting as a shallow open injury with a red, pink wound bed without slough.  Wound Description (Comments): Red/pink  Present on Admission: No     Pressure Injury 01/24/20 Buttocks Mid Stage 1 -  Intact skin with non-blanchable redness of a localized area usually over a bony prominence. (Active)  01/24/20 2300  Location: Buttocks  Location Orientation: Mid  Staging: Stage 1 -  Intact skin with non-blanchable redness of a localized area usually over a bony prominence.  Wound Description (Comments):   Present on Admission: Yes   Subjective: Shortness of breath continues, still on 15L HD [place].  He keeps says he feels ok, but appears tachypenic.   Review of Systems Otherwise negative except as per HPI, including: General = no fevers, chills, dizziness,  fatigue HEENT/EYES = negative for loss of vision, double vision, blurred vision,  sore throa Cardiovascular= negative for chest pain, palpitation Respiratory/lungs= negative for  hemoptysis,  Gastrointestinal= negative for nausea, vomiting, abdominal pain Genitourinary= negative for Dysuria MSK = Negative for arthralgia, myalgias Neurology= Negative for headache, numbness, tingling  Psychiatry= Negative for suicidal and homocidal ideation Skin= Negative for Rash  Examination:  Constitutional: Mild resp distress Respiratory: bilaterally wheezing with diffuse coarse BS. Tachypnea.  Cardiovascular: Normal sinus rhythm, no rubs Abdomen: Nontender nondistended good bowel sounds Musculoskeletal: No edema noted Skin: No rashes seen Neurologic: CN 2-12 grossly intact.  And nonfocal Psychiatric: Poor judgment and insight. Alert and oriented x 3. Normal mood.   Objective: Vitals:   01/26/20 0623 01/26/20 0657 01/26/20 0734 01/26/20 0826  BP:    (!) 104/47  Pulse:    78  Resp:    18  Temp:    97.9 F (36.6 C)  TempSrc:      SpO2: 91%  92% 90%  Weight:  73 kg      Height:        Intake/Output Summary (Last 24 hours) at 01/26/2020 1024 Last data filed at 01/26/2020 0629 Gross per 24 hour  Intake 120 ml  Output 350 ml  Net -230 ml   Filed Weights   01/23/20 2240 01/24/20 1014 01/26/20 0657  Weight: 62.6 kg 62.1 kg 73 kg   Data Reviewed:   CBC: Recent Labs  Lab 01/23/20 2336 01/23/20 2343 01/24/20 0619 01/25/20 0728 01/26/20 0357  WBC  --  9.0 8.3 8.5 8.2  NEUTROABS  --  7.3 5.1  --   --   HGB 9.5* 8.2* 7.3* 8.0* 7.5*  HCT 28.0* 27.8* 25.2* 26.6* 24.1*  MCV  --  93.6 95.1 90.2 89.3  PLT  --  198 177 211 242   Basic Metabolic Panel: Recent Labs  Lab 01/23/20 2336 01/23/20 2343 01/24/20 0619 01/25/20 0728 01/26/20 0357  NA 140 139 140 138 138  K 4.4 4.5 4.8 4.4 4.2  CL  --  103 105 99 99  CO2  --  24 23 28 27   GLUCOSE  --  219* 121* 135* 121*  BUN  --  26* 25* 32* 38*  CREATININE  --  2.51* 2.47* 2.99* 2.98*  CALCIUM  --  9.0 8.4* 8.8* 8.7*  MG  --   --   --  1.8 1.9   GFR: Estimated Creatinine Clearance: 20.1 mL/min (A) (  by C-G formula based on SCr of 2.98 mg/dL (H)). Liver Function Tests: Recent Labs  Lab 01/23/20 2343 01/24/20 0619  AST 14* 15  ALT 11 9  ALKPHOS 86 67  BILITOT 0.7 1.0  PROT 7.7 6.0*  ALBUMIN 3.0* 2.5*   No results for input(s): LIPASE, AMYLASE in the last 168 hours. No results for input(s): AMMONIA in the last 168 hours. Coagulation Profile: Recent Labs  Lab 01/24/20 0049  INR 1.1   Cardiac Enzymes: Recent Labs  Lab 01/24/20 0619  CKTOTAL 129   BNP (last 3 results) No results for input(s): PROBNP in the last 8760 hours. HbA1C: No results for input(s): HGBA1C in the last 72 hours. CBG: No results for input(s): GLUCAP in the last 168 hours. Lipid Profile: No results for input(s): CHOL, HDL, LDLCALC, TRIG, CHOLHDL, LDLDIRECT in the last 72 hours. Thyroid Function Tests: Recent Labs    01/24/20 0619 01/24/20 1553  TSH 5.133*  --   FREET4  --  0.84   Anemia Panel: No  results for input(s): VITAMINB12, FOLATE, FERRITIN, TIBC, IRON, RETICCTPCT in the last 72 hours. Sepsis Labs: Recent Labs  Lab 01/23/20 2343 01/24/20 0619 01/24/20 0833  PROCALCITON  --   --  <0.10  LATICACIDVEN 2.1* 1.8 2.0*    Recent Results (from the past 240 hour(s))  Culture, blood (routine x 2)     Status: None (Preliminary result)   Collection Time: 01/23/20 11:45 PM   Specimen: BLOOD  Result Value Ref Range Status   Specimen Description BLOOD RIGHT ANTECUBITAL  Final   Special Requests   Final    BOTTLES DRAWN AEROBIC AND ANAEROBIC Blood Culture adequate volume   Culture   Final    NO GROWTH 2 DAYS Performed at [facility], [address] [place], [place] [address]    Report Status PENDING  Incomplete  Culture, blood (routine x 2)     Status: None (Preliminary result)   Collection Time: 01/23/20 11:47 PM   Specimen: BLOOD  Result Value Ref Range Status   Specimen Description BLOOD LEFT ANTECUBITAL  Final   Special Requests   Final    BOTTLES DRAWN AEROBIC AND ANAEROBIC Blood Culture adequate volume   Culture   Final    NO GROWTH 2 DAYS Performed at [facility], [place] [address] [place], [place] [address]    Report Status PENDING  Incomplete  Urine culture     Status: None   Collection Time: 01/24/20  2:58 AM   Specimen: Urine, Random  Result Value Ref Range Status   Specimen Description URINE, RANDOM  Final   Special Requests NONE  Final   Culture   Final    NO GROWTH Performed at [facility], [place] [address], [place],  [address]    Report Status 01/24/2020 FINAL  Final  Respiratory Panel by PCR     Status: None   Collection Time: 01/25/20 12:36 PM   Specimen: Nasopharyngeal Swab; Respiratory  Result Value Ref Range Status   Adenovirus NOT DETECTED NOT DETECTED Final   Coronavirus 229E NOT DETECTED NOT DETECTED Final    Comment: (NOTE) The Coronavirus on the Respiratory Panel, DOES NOT test for the novel  Coronavirus (2019  nCoV)    Coronavirus HKU1 NOT DETECTED NOT DETECTED Final   Coronavirus NL63 NOT DETECTED NOT DETECTED Final   Coronavirus OC43 NOT DETECTED NOT DETECTED Final   Metapneumovirus NOT DETECTED NOT DETECTED Final   Rhinovirus / Enterovirus NOT  DETECTED NOT DETECTED Final   Influenza A NOT DETECTED NOT DETECTED Final   Influenza B NOT DETECTED NOT DETECTED Final   Parainfluenza Virus 1 NOT DETECTED NOT DETECTED Final   Parainfluenza Virus 2 NOT DETECTED NOT DETECTED Final   Parainfluenza Virus 3 NOT DETECTED NOT DETECTED Final   Parainfluenza Virus 4 NOT DETECTED NOT DETECTED Final   Respiratory Syncytial Virus NOT DETECTED NOT DETECTED Final   Bordetella pertussis NOT DETECTED NOT DETECTED Final   Chlamydophila pneumoniae NOT DETECTED NOT DETECTED Final   Mycoplasma pneumoniae NOT DETECTED NOT DETECTED Final    Comment: Performed at [facility], [place] [address] [place], [place] [address]     Radiology Studies: NM Pulmonary Perfusion  Result Date: 01/25/2020 CLINICAL DATA:  Suspected pulmonary embolism, positive D-dimer, pulmonary fibrosis and COVID pneumonia 2 months prior now with shortness of breath EXAM: NUCLEAR MEDICINE PERFUSION LUNG SCAN TECHNIQUE: Perfusion images were obtained in multiple projections after intravenous injection of radiopharmaceutical. Ventilation scans intentionally deferred if perfusion scan and chest x-ray adequate for interpretation during COVID 19 epidemic. RADIOPHARMACEUTICALS:  3.7 mCi Tc-24m MAA IV COMPARISON:  Radiograph 01/25/2020, CT imaging 09/19/2019. FINDINGS: Nonsegmental perfusion defects in the left and right lung appear to correspond to the areas of more pronounced fibrotic interstitial change seen on the comparison CT which remains present on today's radiographic examination corresponding with a mismatch defect on perfusion only imaging. No other discernible perfusion defects. IMPRESSION: Per modified PIOPED II perfusion only criteria, is the  presence of the nonsegmental perfusion defects with CT/radiographic correlate should be interpreted as a normal exam to very low probability of pulmonary embolism. Electronically Signed   By: Lovena Le M.D.   On: 01/25/2020 18:12   DG Chest Port 1 View  Result Date: 01/25/2020 CLINICAL DATA:  Dyspnea. EXAM: PORTABLE CHEST 1 VIEW COMPARISON:  Prior chest radiograph 01/23/2020 and earlier, chest CT 11/19/2019 FINDINGS: Prior median sternotomy and CABG. Redemonstrated fractures within multiple sternotomy wires. Heart size at the upper limits of normal, unchanged. Similar appearance of extensive bilateral airspace opacities superimposed upon a background of known chronic interstitial lung disease. No evidence of pneumothorax. Trace bilateral pleural effusions are difficult to exclude. No acute bony abnormality identified IMPRESSION: No significant change as compared to the chest radiograph 01/23/2020. Redemonstrated extensive airspace opacities superimposed upon a background of chronic interstitial lung disease. Trace bilateral pleural effusions are difficult to exclude. Electronically Signed   By: Kellie Simmering DO   On: 01/25/2020 08:01   VAS [place] LOWER EXTREMITY VENOUS (DVT)  Result Date: 01/25/2020  Lower Venous DVTStudy Indications: SOB, and Swelling.  Comparison Study: 09-17-2019 LEV Bilateral- no DVT Performing Technologist: Darlin Coco  Examination Guidelines: A complete evaluation includes B-mode imaging, spectral Doppler, color Doppler, and power Doppler as needed of all accessible portions of each vessel. Bilateral testing is considered an integral part of a complete examination. Limited examinations for reoccurring indications may be performed as noted. The reflux portion of the exam is performed with the patient in reverse Trendelenburg.  +---------+---------------+---------+-----------+----------+--------------+ RIGHT    CompressibilityPhasicitySpontaneityPropertiesThrombus Aging  +---------+---------------+---------+-----------+----------+--------------+ CFV      Full           Yes      Yes                                 +---------+---------------+---------+-----------+----------+--------------+ SFJ      Full                                                        +---------+---------------+---------+-----------+----------+--------------+  FV Prox  Full                                                        +---------+---------------+---------+-----------+----------+--------------+ FV Mid   Full                                                        +---------+---------------+---------+-----------+----------+--------------+ FV DistalFull                                                        +---------+---------------+---------+-----------+----------+--------------+ PFV      Full                                                        +---------+---------------+---------+-----------+----------+--------------+ POP      Full           Yes      Yes                                 +---------+---------------+---------+-----------+----------+--------------+ PTV      Full                                                        +---------+---------------+---------+-----------+----------+--------------+ PERO     Full                                                        +---------+---------------+---------+-----------+----------+--------------+   +---------+---------------+---------+-----------+----------+--------------+ LEFT     CompressibilityPhasicitySpontaneityPropertiesThrombus Aging +---------+---------------+---------+-----------+----------+--------------+ CFV      Full           Yes      Yes                                 +---------+---------------+---------+-----------+----------+--------------+ SFJ      Full                                                         +---------+---------------+---------+-----------+----------+--------------+ FV Prox  Full                                                        +---------+---------------+---------+-----------+----------+--------------+  FV Mid   Full                                                        +---------+---------------+---------+-----------+----------+--------------+ FV DistalFull                                                        +---------+---------------+---------+-----------+----------+--------------+ PFV      Full                                                        +---------+---------------+---------+-----------+----------+--------------+ POP      Full           Yes      Yes                                 +---------+---------------+---------+-----------+----------+--------------+ PTV      Full                                                        +---------+---------------+---------+-----------+----------+--------------+ PERO     Full                                                        +---------+---------------+---------+-----------+----------+--------------+     Summary: RIGHT: - There is no evidence of deep vein thrombosis in the lower extremity.  - No cystic structure found in the popliteal fossa.  LEFT: - There is no evidence of deep vein thrombosis in the lower extremity.  - No cystic structure found in the popliteal fossa.  *See table(s) above for measurements and observations.    Preliminary         Scheduled Meds: . aspirin EC  81 mg Oral Daily  . atorvastatin  20 mg Oral QHS  . clonazePAM  0.5 mg Oral BID  . feeding supplement (ENSURE ENLIVE)  237 mL Oral BID BM  . ferrous sulfate  325 mg Oral BID WC  . fluticasone furoate-vilanterol  1 puff Inhalation Daily  . furosemide  40 mg Intravenous Once  . guaiFENesin  600 mg Oral BID  . heparin  5,000 Units Subcutaneous Q8H  . ipratropium-albuterol  3 mL Nebulization BID  . magnesium  oxide  400 mg Oral Daily  . methylPREDNISolone (SOLU-MEDROL) injection  40 mg Intravenous Q8H  . multivitamin with minerals  1 tablet Oral Daily  . pantoprazole  40 mg Oral Daily  . QUEtiapine  25 mg Oral QHS  . senna-docusate  2 tablet Oral QHS  . tamsulosin  0.8 mg Oral QPC supper  . umeclidinium bromide  1 puff Inhalation Daily  .  vitamin B-12  500 mcg Oral BID   Continuous Infusions:    LOS: 2 days   Time spent= 35 mins  Reymond Maynez Arsenio Loader, MD [facility]  If 7PM-7AM, please contact night-coverage  01/26/2020, 10:24 AM

## 2020-01-26 NOTE — Consult Note (Signed)
NAME:  Robert Little, MRN:  [id number], DOB:  [birth year], LOS: 2 ADMISSION DATE:  01/23/2020, CONSULTATION DATE:  7/24 REFERRING MD:  Reesa Chew, CHIEF COMPLAINT:  Dyspnea   Brief History   79 year old male admitted on January 23, 2020 in the setting of acute on chronic respiratory failure with hypoxemia, has baseline history of pulmonary fibrosis, extensive smoking history, head COVID in 2020.  Pulmonary and critical care medicine consulted for evaluation of worsening hypoxemia and infiltrates.  History of present illness   This is a pleasant 79 year old male with an extensive past medical history including fibrotic lung disease and chronic respiratory failure with hypoxemia who is admitted to this facility on January 23, 2020 in the setting of worsening shortness of breath and hypoxemia.   He was first seen by pulmonary medicine in March 2021 when he was admitted for shortness of breath and was noted to be hypoxemic requiring 2 to 4 L nasal cannula of oxygen.  Chest imaging at that time revealed honeycombing and traction bronchiectasis in the bases of his lungs and a read by radiology was felt to be consistent with probable UIP.  There is also some groundglass seen as well as pulmonary nodules.  During that time he had a work-up which included a modified barium swallow which was suggestive of laryngeal penetration from certain foods that he was started on a dysphagia diet.  Pulmonary was consulted and he was post to follow-up in clinic but he was unable to due to repeated hospitalizations.  He was hospitalized in April 2021 in the setting of hypoglycemia.  He was hospitalized in May 2021 in the setting of COVID-19 pneumonia, during that time a speech therapy evaluation showed silent aspiration.  He was treated with standard Covid treatment and at the time of hospital discharge she was back down to 2 to 3 L of oxygen which was his baseline in 2021 since his diagnosis of pulmonary fibrosis. He has a past medical  history significant for atrial fibrillation which was diagnosed in May 2021 while hospitalized for COVID-19 pneumonia.  He was started on amiodarone at that time. He was admitted on January 23, 2020 in the setting of increasing chest congestion, shortness of breath and worsening oxygen needs.  He has been treated with diuresis and antibiotics.  Pulmonary and critical care medicine was consulted for persistent hypoxemia after several days of treatment.  Here he has had one weight recorded on July 22 at 10 AM of 62 kg.  A weight on 7/23 was documented at 73 kg.  He has received 4 doses of furosemide but ins and outs have not been very well-documented. He denies leg swelling.  He is still requiring 10 L of oxygen nasal cannula He has a 200-pack-year smoking history, quit in 2010.  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  Past Medical History  Schizoaffective schizophrenia Hypertension GERD Diabetes mellitus Coronary artery disease COVID-19 pneumonia and May 2021 CKD stage IV Chronic respiratory failure with hypoxemia on 2 L nasal cannula since March 2021 Atrial fibrillation diagnosed while hospitalized in May 2021 for COVID-19 Dysphagia with silent aspiration seen in May 2021 modified barium swallow  Significant Hospital Events   July 21 admission July 24 pulmonary critical care medicine consult  Consults:  PCCM  Procedures:    Significant Diagnostic Tests:  2019 chest x-ray images independently reviewed showing basilar fibrosis March 2021 CT chest high-resolution images independently reviewed showing emphysema in the upper lobes, some traction bronchiolectasis and  probable peripheral honeycombing in the bases, right upper lobe bronchiectatic changes with surrounding fibrotic changes centrally and peripherally.  Pulmonary nodules noted May 2021 modified barium swallow showed silent aspiration January 23, 2020 CT angiogram chest images independently reviewed showing  increased groundglass opacifications bilaterally in addition to extensive fibrotic disease as detailed above.  Micro Data:  July 23 respiratory viral panel negative July 21 blood culture negative  Antimicrobials:  7/21 cefepime x1 7/21 doxycycline x1 7/21 vancomycin x1  Interim history/subjective:  As above  Objective   Blood pressure (!) 128/61, pulse 82, temperature 97.9 F (36.6 C), resp. rate 18, height 5\' 8"  (1.727 m), weight 73 kg, SpO2 100 %.        Intake/Output Summary (Last 24 hours) at 01/26/2020 1752 Last data filed at 01/26/2020 1100 Gross per 24 hour  Intake 360 ml  Output 550 ml  Net -190 ml   Filed Weights   01/23/20 2240 01/24/20 1014 01/26/20 0657  Weight: 62.6 kg 62.1 kg 73 kg    Examination:  Gen: well appearing, no acute distress HENT: NCAT, poor dentition, OP clear, neck supple without masses Eyes: PERRL, EOMi Lymph: no cervical lymphadenopathy PULM: Coarse crackles bases CV: RRR, no mgr, no JVD GI: BS+, soft, nontender, no hsm Derm: no rash or skin breakdown MSK: normal bulk and tone Neuro: A&Ox4, CN II-XII intact, strength 5/5 in all 4 extremities Psyche: normal mood and affect  July 22 EKG reviewed showing sinus rhythm   Resolved Hospital Problem list     Assessment & Plan:  Acute on chronic respiratory failure with hypoxemia in the setting of baseline diffuse parenchymal lung disease, diagnosis of COVID pneumonia in May 2021, and amiodarone used for atrial fibrillation since May 2021. Differential diagnosis here is broad, I agree with management so far with diuresis and with initial empiric antibiotic use.  However at this point with lack of fever and productive cough healthcare associated pneumonia seems unlikely.  I am worried about amiodarone induced lung toxicity in addition to his baseline chronic respiratory failure with hypoxemia.  A flare of his underlying ILD (uncertain etiology, fibrotic NSIP, aspiration changes, or UIP/IPF all  probable) is also possible.  I doubt there is underlying pulmonary edema based on the lasix given, his labs, and physical exam (weights unhelpful). A bronchoscopy may be of some use if he could tolerate it but I do not think that that would be safe right now given his high oxygen needs.  It would be nice to get a BAL to perform a cell count and differential. For now we will treat as if this is amiodarone induced lung toxicity by stopping amiodarone and starting steroids. > stop amiodarone > start solumedrol 2mg /kg IV daily here in hospital then decrease to prednisone 50mg  daily after 3-5 days if improvement > check daily weights > wean off O2 for O2 saturation > 88% > stay mobile > needs outpatient ILD work up  Dysphagia/silent aspiration > continue dysphagia 3 diet, aspiration precautions    Emphysema > continue Breo, Anoro > needs outpatient PFT  Atrial fibrillation: resolved? > tele > stop amiodarone  Best practice:   Per [facility]  Labs   CBC: Recent Labs  Lab 01/23/20 2336 01/23/20 2343 01/24/20 0619 01/25/20 0728 01/26/20 0357  WBC  --  9.0 8.3 8.5 8.2  NEUTROABS  --  7.3 5.1  --   --   HGB 9.5* 8.2* 7.3* 8.0* 7.5*  HCT 28.0* 27.8* 25.2* 26.6* 24.1*  MCV  --  93.6 95.1 90.2 89.3  PLT  --  198 177 211 209    Basic Metabolic Panel: Recent Labs  Lab 01/23/20 2336 01/23/20 2343 01/24/20 0619 01/25/20 0728 01/26/20 0357  NA 140 139 140 138 138  K 4.4 4.5 4.8 4.4 4.2  CL  --  103 105 99 99  CO2  --  24 23 28 27   GLUCOSE  --  219* 121* 135* 121*  BUN  --  26* 25* 32* 38*  CREATININE  --  2.51* 2.47* 2.99* 2.98*  CALCIUM  --  9.0 8.4* 8.8* 8.7*  MG  --   --   --  1.8 1.9   GFR: Estimated Creatinine Clearance: 20.1 mL/min (A) (by C-G formula based on SCr of 2.98 mg/dL (H)). Recent Labs  Lab 01/23/20 2343 01/24/20 0619 01/24/20 0833 01/25/20 0728 01/26/20 0357  PROCALCITON  --   --  <0.10  --   --   WBC 9.0 8.3  --  8.5 8.2  LATICACIDVEN 2.1* 1.8 2.0*  --    --     Liver Function Tests: Recent Labs  Lab 01/23/20 2343 01/24/20 0619  AST 14* 15  ALT 11 9  ALKPHOS 86 67  BILITOT 0.7 1.0  PROT 7.7 6.0*  ALBUMIN 3.0* 2.5*   No results for input(s): LIPASE, AMYLASE in the last 168 hours. No results for input(s): AMMONIA in the last 168 hours.  ABG    Component Value Date/Time   PHART 7.428 01/23/2020 2336   PCO2ART 37.2 01/23/2020 2336   PO2ART 86 01/23/2020 2336   HCO3 24.7 01/23/2020 2336   TCO2 26 01/23/2020 2336   ACIDBASEDEF 5.0 (H) 11/12/2019 1939   O2SAT 97.0 01/23/2020 2336     Coagulation Profile: Recent Labs  Lab 01/24/20 0049  INR 1.1    Cardiac Enzymes: Recent Labs  Lab 01/24/20 0619  CKTOTAL 129    HbA1C: Hgb A1c MFr Bld  Date/Time Value Ref Range Status  09/20/2019 02:38 AM 7.0 (H) 4.8 - 5.6 % Final    Comment:    (NOTE) Pre diabetes:          5.7%-6.4% Diabetes:              >6.4% Glycemic control for   <7.0% adults with diabetes     CBG: No results for input(s): GLUCAP in the last 168 hours.  Review of Systems:   Gen: Denies fever, chills, weight change, fatigue, night sweats HEENT: Denies blurred vision, double vision, hearing loss, tinnitus, sinus congestion, rhinorrhea, sore throat, neck stiffness, dysphagia PULM: per HPI CV: Denies chest pain, edema, orthopnea, paroxysmal nocturnal dyspnea, palpitations GI: Denies abdominal pain, nausea, vomiting, diarrhea, hematochezia, melena, constipation, change in bowel habits GU: Denies dysuria, hematuria, polyuria, oliguria, urethral discharge Endocrine: Denies hot or cold intolerance, polyuria, polyphagia or appetite change Derm: Denies rash, dry skin, scaling or peeling skin change Heme: Denies easy bruising, bleeding, bleeding gums Neuro: Denies headache, numbness, weakness, slurred speech, loss of memory or consciousness   Past Medical History  He,  has a past medical history of Anemia, Chronic diastolic CHF (congestive heart failure)  (HCC), Chronic respiratory failure ([place]), CKD (chronic kidney disease), stage IV ([place]), Coronary artery disease, COVID-19 (11/2019), Diabetes mellitus ([place]), Dysphagia, GERD (gastroesophageal reflux disease), Hypertension, Lung nodule, Pulmonary fibrosis ([place]), Schizo affective schizophrenia ([place]), and Tobacco abuse.   Surgical History    Past Surgical History:  Procedure Laterality Date  . CORONARY ARTERY BYPASS GRAFT  Social History   reports that he has quit smoking. His smoking use included cigarettes. He has never used smokeless tobacco. He reports previous alcohol use. He reports previous drug use.   Family History   His family history includes Diabetes Mellitus II in his maternal grandmother and mother; Heart disease in his maternal grandmother.   Allergies No Known Allergies   Home Medications  Prior to Admission medications   Medication Sig Start Date End Date Taking? Authorizing Provider  acetaminophen (TYLENOL) 325 MG tablet Take 2 tablets (650 mg total) by mouth every 6 (six) hours as needed for mild pain (or Fever >/= 101). Patient taking differently: Take 650 mg by mouth every 6 (six) hours as needed for mild pain.  05/18/18  Yes Debbe Odea, MD  albuterol (VENTOLIN HFA) 108 (90 Base) MCG/ACT inhaler Inhale 2 puffs into the lungs every 6 (six) hours as needed for wheezing or shortness of breath. 09/21/19  Yes Mercy Riding, MD  amiodarone (PACERONE) 100 MG tablet Take 100 mg by mouth daily.   Yes [provider]  aspirin EC 81 MG EC tablet Take 1 tablet (81 mg total) by mouth daily. 09/22/19  Yes Mercy Riding, MD  atorvastatin (LIPITOR) 20 MG tablet Take 20 mg by mouth at bedtime.   Yes [provider]  cefTRIAXone (ROCEPHIN) IVPB Inject 1 g into the vein daily.   Yes [provider]  clonazePAM (KLONOPIN) 0.5 MG tablet Take 1 tablet (0.5 mg total) by mouth 2 (two) times daily. 11/30/19  Yes Thurnell Lose, MD  doxycycline (VIBRAMYCIN) 100  MG capsule Take 100 mg by mouth 2 (two) times daily.   Yes [provider]  Emollient (EUCERIN SKIN CALMING) CREA Apply topically in the morning and at bedtime. To legs, arms and back   Yes [provider]  ferrous sulfate 325 (65 FE) MG tablet Take 1 tablet (325 mg total) by mouth 2 (two) times daily with a meal. 09/21/19  Yes Gonfa, Taye T, MD  fluticasone furoate-vilanterol (BREO ELLIPTA) 200-25 MCG/INH AEPB Inhale 1 puff into the lungs daily. 09/22/19  Yes Mercy Riding, MD  guaiFENesin (MUCINEX) 600 MG 12 hr tablet Take 600 mg by mouth 2 (two) times daily.   Yes [provider]  ipratropium (ATROVENT) 0.03 % nasal spray Place 1 spray into the nose daily as needed for congestion. 11/02/19  Yes [provider]  ipratropium-albuterol (DUONEB) 0.5-2.5 (3) MG/3ML SOLN Take 3 mLs by nebulization every 8 (eight) hours.   Yes [provider]  magnesium oxide (MAG-OX) 400 MG tablet Take 400 mg by mouth in the morning, at noon, and at bedtime.   Yes [provider]  Multiple Vitamin (MULTIVITAMIN WITH MINERALS) TABS tablet Take 1 tablet by mouth daily.   Yes [provider]  pantoprazole (PROTONIX) 40 MG tablet Take 1 tablet (40 mg total) by mouth daily. 11/17/19  Yes Thurnell Lose, MD  polyethylene glycol powder (MIRALAX) 17 GM/SCOOP powder Take 17 g by mouth 2 (two) times daily as needed for moderate constipation. 09/21/19  Yes Mercy Riding, MD  QUEtiapine (SEROQUEL) 25 MG tablet Take 1 tablet (25 mg total) by mouth at bedtime. 11/30/19  Yes Thurnell Lose, MD  tamsulosin (FLOMAX) 0.4 MG CAPS capsule Take 1 capsule (0.4 mg total) by mouth daily after supper. Patient taking differently: Take 0.8 mg by mouth daily after supper.  05/18/18  Yes Debbe Odea, MD  umeclidinium bromide (INCRUSE ELLIPTA) 62.5 MCG/INH  AEPB Inhale 1 puff into the lungs daily. 09/22/19  Yes Mercy Riding, MD  vitamin B-12 (CYANOCOBALAMIN) 500 MCG tablet Take 500 mcg  by mouth 2 (two) times daily.   Yes [provider]  amiodarone (PACERONE) 200 MG tablet Take 1 tablet (200 mg total) by mouth daily. Patient not taking: Reported on 01/23/2020 12/01/19   Thurnell Lose, MD  carvedilol (COREG) 3.125 MG tablet Take 1 tablet (3.125 mg total) by mouth 2 (two) times daily with a meal. Patient not taking: Reported on 01/18/2020 11/30/19   Thurnell Lose, MD  feeding supplement, ENSURE ENLIVE, (ENSURE ENLIVE) LIQD Take 237 mLs by mouth 2 (two) times daily between meals. Patient not taking: Reported on 01/23/2020 11/30/19   Thurnell Lose, MD     Critical care time: n/a    Roselie Awkward, MD [place] [facility] Pager: [phone] Cell: [phone] If no response, call [phone]

## 2020-01-26 NOTE — Progress Notes (Signed)
Progress Note  Patient Name: Robert Little Date of Encounter: 01/26/2020  [facility] Cardiologist: Sanda Klein, MD   Subjective   Coughing frequently. Reports that he slept well. Breathing unchanged, though able to lie flat in bed. No chest pain.  Inpatient Medications    Scheduled Meds: . aspirin EC  81 mg Oral Daily  . atorvastatin  20 mg Oral QHS  . clonazePAM  0.5 mg Oral BID  . feeding supplement (ENSURE ENLIVE)  237 mL Oral BID BM  . ferrous sulfate  325 mg Oral BID WC  . fluticasone furoate-vilanterol  1 puff Inhalation Daily  . furosemide  40 mg Intravenous Once  . guaiFENesin  600 mg Oral BID  . heparin  5,000 Units Subcutaneous Q8H  . ipratropium-albuterol  3 mL Nebulization BID  . magnesium oxide  400 mg Oral Daily  . methylPREDNISolone (SOLU-MEDROL) injection  40 mg Intravenous Q8H  . multivitamin with minerals  1 tablet Oral Daily  . pantoprazole  40 mg Oral Daily  . QUEtiapine  25 mg Oral QHS  . senna-docusate  2 tablet Oral QHS  . tamsulosin  0.8 mg Oral QPC supper  . umeclidinium bromide  1 puff Inhalation Daily  . vitamin B-12  500 mcg Oral BID   Continuous Infusions:  PRN Meds: acetaminophen, albuterol, ondansetron **OR** ondansetron (ZOFRAN) IV, polyethylene glycol   Vital Signs    Vitals:   01/26/20 0623 01/26/20 0657 01/26/20 0734 01/26/20 0826  BP:    (!) 104/47  Pulse:    78  Resp:    18  Temp:    97.9 F (36.6 C)  TempSrc:      SpO2: 91%  92% 90%  Weight:  73 kg    Height:        Intake/Output Summary (Last 24 hours) at 01/26/2020 0940 Last data filed at 01/26/2020 0629 Gross per 24 hour  Intake 120 ml  Output 350 ml  Net -230 ml   Last 3 Weights 01/26/2020 01/24/2020 01/23/2020  Weight (lbs) 160 lb 15 oz 137 lb 138 lb 0.1 oz  Weight (kg) 73 kg 62.143 kg 62.6 kg      Telemetry    NSR - Personally Reviewed  ECG    No new since 01/24/20 - Personally Reviewed  Physical Exam   GEN: No acute distress.  Frail, hard of  hearing Neck: No JVD lying nearly flat in bed Cardiac: RRR, no murmurs, rubs, or gallops appreciated but may be obscured due to breath sounds Respiratory: Diffusely coarse throughout anterior and lateral fields. Rhonchi, fine wheezing heard GI: Soft, nontender, non-distended  MS: No edema; No deformity. Neuro:  Nonfocal  Psych: Normal affect   Labs    High Sensitivity Troponin:   Recent Labs  Lab 01/24/20 0619 01/24/20 0833 01/24/20 1553 01/24/20 1752 01/24/20 2245  TROPONINIHS 1,831* 1,903* 785* 844* 941*      Chemistry Recent Labs  Lab 01/23/20 2343 01/23/20 2343 01/24/20 0619 01/25/20 0728 01/26/20 0357  NA 139   < > 140 138 138  K 4.5   < > 4.8 4.4 4.2  CL 103   < > 105 99 99  CO2 24   < > 23 28 27   GLUCOSE 219*   < > 121* 135* 121*  BUN 26*   < > 25* 32* 38*  CREATININE 2.51*   < > 2.47* 2.99* 2.98*  CALCIUM 9.0   < > 8.4* 8.8* 8.7*  PROT 7.7  --  6.0*  --   --  ALBUMIN 3.0*  --  2.5*  --   --   AST 14*  --  15  --   --   ALT 11  --  9  --   --   ALKPHOS 86  --  67  --   --   BILITOT 0.7  --  1.0  --   --   GFRNONAA 24*   < > 24* 19* 19*  GFRAA 28*   < > 28* 22* 22*  ANIONGAP 12   < > 12 11 12    < > = values in this interval not displayed.     Hematology Recent Labs  Lab 01/24/20 [id number] 01/25/20 0728 01/26/20 0357  WBC 8.3 8.5 8.2  RBC 2.65* 2.95* 2.70*  HGB 7.3* 8.0* 7.5*  HCT 25.2* 26.6* 24.1*  MCV 95.1 90.2 89.3  MCH 27.5 27.1 27.8  MCHC 29.0* 30.1 31.1  RDW 18.1* 17.7* 17.5*  PLT 177 211 223    BNP Recent Labs  Lab 01/23/20 2343 01/25/20 0733  BNP 836.2* 549.0*     DDimer  Recent Labs  Lab 01/25/20 0746  DDIMER 2.12*     Radiology    NM Pulmonary Perfusion  Result Date: 01/25/2020 CLINICAL DATA:  Suspected pulmonary embolism, positive D-dimer, pulmonary fibrosis and COVID pneumonia 2 months prior now with shortness of breath EXAM: NUCLEAR MEDICINE PERFUSION LUNG SCAN TECHNIQUE: Perfusion images were obtained in multiple  projections after intravenous injection of radiopharmaceutical. Ventilation scans intentionally deferred if perfusion scan and chest x-ray adequate for interpretation during COVID 19 epidemic. RADIOPHARMACEUTICALS:  3.7 mCi Tc-66m MAA IV COMPARISON:  Radiograph 01/25/2020, CT imaging 09/19/2019. FINDINGS: Nonsegmental perfusion defects in the left and right lung appear to correspond to the areas of more pronounced fibrotic interstitial change seen on the comparison CT which remains present on today's radiographic examination corresponding with a mismatch defect on perfusion only imaging. No other discernible perfusion defects. IMPRESSION: Per modified PIOPED II perfusion only criteria, is the presence of the nonsegmental perfusion defects with CT/radiographic correlate should be interpreted as a normal exam to very low probability of pulmonary embolism. Electronically Signed   By: Lovena Le M.D.   On: 01/25/2020 18:12   DG Chest Port 1 View  Result Date: 01/25/2020 CLINICAL DATA:  Dyspnea. EXAM: PORTABLE CHEST 1 VIEW COMPARISON:  Prior chest radiograph 01/23/2020 and earlier, chest CT 11/19/2019 FINDINGS: Prior median sternotomy and CABG. Redemonstrated fractures within multiple sternotomy wires. Heart size at the upper limits of normal, unchanged. Similar appearance of extensive bilateral airspace opacities superimposed upon a background of known chronic interstitial lung disease. No evidence of pneumothorax. Trace bilateral pleural effusions are difficult to exclude. No acute bony abnormality identified IMPRESSION: No significant change as compared to the chest radiograph 01/23/2020. Redemonstrated extensive airspace opacities superimposed upon a background of chronic interstitial lung disease. Trace bilateral pleural effusions are difficult to exclude. Electronically Signed   By: Kellie Simmering DO   On: 01/25/2020 08:01   ECHOCARDIOGRAM COMPLETE  Result Date: 01/24/2020    ECHOCARDIOGRAM REPORT   Patient  Name:   ZACARY BAUER Date of Exam: 01/24/2020 Medical Rec #:  [id number]    Height:       68.0 in Accession #:    [id number]   Weight:       138.0 lb Date of Birth:  23-Aug-1940    BSA:          1.746 m Patient Age:    49 years  BP:           115/47 mmHg Patient Gender: M            HR:           79 bpm. Exam Location:  Inpatient Procedure: 2D Echo and Intracardiac Opacification Agent Indications:    Elevated Troponin  History:        Patient has prior history of Echocardiogram examinations, most                 recent 11/14/2019. CAD, Prior CABG; Risk Factors:Hypertension.                 Pulmonary fibrosis, paroxysmal atrial fibrillation, diastolic                 CHF, chronic kidney disease stage IV, chronic anemia. Covid                 pneumonia in may.  Sonographer:    Darlina Sicilian RDCS Referring Phys: [place]  1. Left ventricular ejection fraction, by estimation, is 60 to 65%. The left ventricle has normal function. The left ventricle has no regional wall motion abnormalities. There is mild left ventricular hypertrophy. Left ventricular diastolic parameters are consistent with Grade II diastolic dysfunction (pseudonormalization). Elevated left atrial pressure.  2. Right ventricular systolic function is normal. The right ventricular size is mildly enlarged. Tricuspid regurgitation signal is inadequate for assessing PA pressure.  3. The mitral valve is normal in structure. Mild mitral valve regurgitation.  4. The aortic valve is tricuspid. Aortic valve regurgitation is not visualized. Mild to moderate aortic valve sclerosis/calcification is present, without any evidence of aortic stenosis. FINDINGS  Left Ventricle: Left ventricular ejection fraction, by estimation, is 60 to 65%. The left ventricle has normal function. The left ventricle has no regional wall motion abnormalities. Definity contrast agent was given IV to delineate the left ventricular  endocardial borders. The left  ventricular internal cavity size was normal in size. There is mild left ventricular hypertrophy. Left ventricular diastolic parameters are consistent with Grade II diastolic dysfunction (pseudonormalization). Elevated left atrial pressure. Right Ventricle: The right ventricular size is mildly enlarged. Right vetricular wall thickness was not assessed. Right ventricular systolic function is normal. Tricuspid regurgitation signal is inadequate for assessing PA pressure. Left Atrium: Left atrial size was normal in size. Right Atrium: Right atrial size was not well visualized. Pericardium: Trivial pericardial effusion is present. Mitral Valve: The mitral valve is normal in structure. Mild mitral valve regurgitation. Tricuspid Valve: The tricuspid valve is normal in structure. Tricuspid valve regurgitation is mild. Aortic Valve: The aortic valve is tricuspid. Aortic valve regurgitation is not visualized. Mild to moderate aortic valve sclerosis/calcification is present, without any evidence of aortic stenosis. Pulmonic Valve: The pulmonic valve was not well visualized. Pulmonic valve regurgitation is not visualized. Aorta: The aortic root is normal in size and structure. IAS/Shunts: The interatrial septum was not well visualized.  LEFT VENTRICLE PLAX 2D LVIDd:         4.00 cm     Diastology LVIDs:         2.90 cm     LV e' lateral:   6.53 cm/s LV PW:         1.10 cm     LV E/e' lateral: 15.9 LV IVS:        1.10 cm     LV e' medial:    5.77 cm/s LVOT diam:  1.60 cm     LV E/e' medial:  18.0 LV SV:         35 LV SV Index:   20 LVOT Area:     2.01 cm  LV Volumes (MOD) LV vol d, MOD A2C: 94.5 ml LV vol d, MOD A4C: 98.0 ml LV vol s, MOD A2C: 64.1 ml LV vol s, MOD A4C: 33.0 ml LV SV MOD A2C:     30.4 ml LV SV MOD A4C:     98.0 ml LV SV MOD BP:      54.2 ml RIGHT VENTRICLE RV S prime:     12.90 cm/s TAPSE (M-mode): 1.6 cm LEFT ATRIUM             Index LA diam:        4.40 cm 2.52 cm/m LA Vol (A2C):   45.0 ml 25.78 ml/m LA  Vol (A4C):   56.8 ml 32.54 ml/m LA Biplane Vol: 54.6 ml 31.28 ml/m  AORTIC VALVE LVOT Vmax:   96.10 cm/s LVOT Vmean:  56.000 cm/s LVOT VTI:    0.173 m  AORTA Ao Root diam: 3.20 cm MITRAL VALVE MV Area (PHT): 4.49 cm     SHUNTS MV Decel Time: 169 msec     Systemic VTI:  0.17 m MV E velocity: 104.00 cm/s  Systemic Diam: 1.60 cm MV A velocity: 78.60 cm/s MV E/A ratio:  1.32 Oswaldo Milian MD Electronically signed by Oswaldo Milian MD Signature Date/Time: 01/24/2020/2:01:53 PM    Final    VAS [place] LOWER EXTREMITY VENOUS (DVT)  Result Date: 01/25/2020  Lower Venous DVTStudy Indications: SOB, and Swelling.  Comparison Study: 09-17-2019 LEV Bilateral- no DVT Performing Technologist: Darlin Coco  Examination Guidelines: A complete evaluation includes B-mode imaging, spectral Doppler, color Doppler, and power Doppler as needed of all accessible portions of each vessel. Bilateral testing is considered an integral part of a complete examination. Limited examinations for reoccurring indications may be performed as noted. The reflux portion of the exam is performed with the patient in reverse Trendelenburg.  +---------+---------------+---------+-----------+----------+--------------+ RIGHT    CompressibilityPhasicitySpontaneityPropertiesThrombus Aging +---------+---------------+---------+-----------+----------+--------------+ CFV      Full           Yes      Yes                                 +---------+---------------+---------+-----------+----------+--------------+ SFJ      Full                                                        +---------+---------------+---------+-----------+----------+--------------+ FV Prox  Full                                                        +---------+---------------+---------+-----------+----------+--------------+ FV Mid   Full                                                         +---------+---------------+---------+-----------+----------+--------------+  FV DistalFull                                                        +---------+---------------+---------+-----------+----------+--------------+ PFV      Full                                                        +---------+---------------+---------+-----------+----------+--------------+ POP      Full           Yes      Yes                                 +---------+---------------+---------+-----------+----------+--------------+ PTV      Full                                                        +---------+---------------+---------+-----------+----------+--------------+ PERO     Full                                                        +---------+---------------+---------+-----------+----------+--------------+   +---------+---------------+---------+-----------+----------+--------------+ LEFT     CompressibilityPhasicitySpontaneityPropertiesThrombus Aging +---------+---------------+---------+-----------+----------+--------------+ CFV      Full           Yes      Yes                                 +---------+---------------+---------+-----------+----------+--------------+ SFJ      Full                                                        +---------+---------------+---------+-----------+----------+--------------+ FV Prox  Full                                                        +---------+---------------+---------+-----------+----------+--------------+ FV Mid   Full                                                        +---------+---------------+---------+-----------+----------+--------------+ FV DistalFull                                                        +---------+---------------+---------+-----------+----------+--------------+  PFV      Full                                                         +---------+---------------+---------+-----------+----------+--------------+ POP      Full           Yes      Yes                                 +---------+---------------+---------+-----------+----------+--------------+ PTV      Full                                                        +---------+---------------+---------+-----------+----------+--------------+ PERO     Full                                                        +---------+---------------+---------+-----------+----------+--------------+     Summary: RIGHT: - There is no evidence of deep vein thrombosis in the lower extremity.  - No cystic structure found in the popliteal fossa.  LEFT: - There is no evidence of deep vein thrombosis in the lower extremity.  - No cystic structure found in the popliteal fossa.  *See table(s) above for measurements and observations.    Preliminary     Cardiac Studies   2D echo 01/24/20  1. Left ventricular ejection fraction, by estimation, is 60 to 65%. The  left ventricle has normal function. The left ventricle has no regional  wall motion abnormalities. There is mild left ventricular hypertrophy.  Left ventricular diastolic parameters  are consistent with Grade II diastolic dysfunction (pseudonormalization).  Elevated left atrial pressure.  2. Right ventricular systolic function is normal. The right ventricular  size is mildly enlarged. Tricuspid regurgitation signal is inadequate for  assessing PA pressure.  3. The mitral valve is normal in structure. Mild mitral valve  regurgitation.  4. The aortic valve is tricuspid. Aortic valve regurgitation is not  visualized. Mild to moderate aortic valve sclerosis/calcification is  present, without any evidence of aortic stenosis.   Patient Profile     79 y.o. male with a hx of CAD s/p prior CABG([facility] 02/2004),DM2,HTN, HLD, pulmonary fibrosiswith chronic respiratory failure onhome O2,Covid-19 PNA 11/2019,schizoaffective  disorderin a group home, CKD IV, anemia of chronic disease,GERD, chronic diastolic CHF, lung nodule, extensive tobacco history, dysphagia, chronic appearing anemiawho is being followed for the evaluation of elevated troponin/heart failureat the request of Dr. Reesa Chew.  Assessment & Plan    Acute on chronic hypoxic respiratory failure: -Pt has multifactorial underlying pulmonary fibrosis, acute on chronic diastolic heart failure, and h/o Covid-19 PNA in 11/2019 -Elevated D-dimer, VQ scan low probability -has had increased O2 requirements. We are in a difficult position. Renal function worsened with diuretics, but breathing still requiring high flow nasal cannula. His exam suggests he is not volume overloaded from right heart failure, but it is difficult to determine how much of his respiratory failure is  driven by his heart vs. His lungs. Has been ordered for a dose of lasix today by primary team, would monitor output and renal function carefully with this. May need to involve pulmonary team if he does not improve. -was previously treated for pneumonia, procalcitonin negative, antibiotics stopped  Acute on chronic diastolic heart failure -LVEF preserved as above -BNP found to be 549 -no output documented -admission weight 62.1 kg, weight today 73 kg. If accurate, this is concerning. Would attempt to at least get daily weights if we cannot accurately chart output, but would be very helpful to know what his urine output is (especially while receiving diuretics).  Elevated troponin in the context of known CAD s/p CABG 2005 -hsTroponins [phone] -Reassuring echo  -Continue ASA, statin (LDL 41 in 09/2019) -defer to primary team, but could consider transfusion given elevated troponin, chronic kidney disease, elevated O2 requirements. Need to consider risk of volume overload with transfusion, would give lasix. He has remained over Hgb 7, so not urgent, and this is also his chronic  baseline.   CKD stage IV -Creatinine 2.98 today  -diuretics on hold given rising Cr  Acute on chronic anemia -Baseline Hgb range 6-9 -Hgb 7.5 today, stable  Abnormal TSH  -Amiodarone discontinued  -will need monitored by PCP  Paroxysmal atrial fibrillation, diagnosed 11/2019 in context of Covid -in NSR currently -CHA2DS2/VAS Stroke Risk Points=5, but not on anticoagulation due to anemia and risk of falls per prior discussion -amiodarone stopped this admission given worsening interstitial lung findings  For questions or updates, please contact [facility] Please consult [email or web address] for contact info under    Signed, Buford Dresser, MD  01/26/2020, 9:40 AM

## 2020-01-27 LAB — BASIC METABOLIC PANEL
Anion gap: 13 (ref 5–15)
BUN: 51 mg/dL — ABNORMAL HIGH (ref 8–23)
CO2: 25 mmol/L (ref 22–32)
Calcium: 9.1 mg/dL (ref 8.9–10.3)
Chloride: 97 mmol/L — ABNORMAL LOW (ref 98–111)
Creatinine, Ser: 3.06 mg/dL — ABNORMAL HIGH (ref 0.61–1.24)
GFR calc Af Amer: 22 mL/min — ABNORMAL LOW (ref >60)
GFR calc non Af Amer: 19 mL/min — ABNORMAL LOW (ref >60)
Glucose, Bld: 206 mg/dL — ABNORMAL HIGH (ref 70–99)
Potassium: 5 mmol/L (ref 3.5–5.1)
Sodium: 135 mmol/L (ref 135–145)

## 2020-01-27 LAB — CBC
HCT: 26 % — ABNORMAL LOW (ref 39.0–52.0)
Hemoglobin: 8 g/dL — ABNORMAL LOW (ref 13.0–17.0)
MCH: 27 pg (ref 26.0–34.0)
MCHC: 30.8 g/dL (ref 30.0–36.0)
MCV: 87.8 fL (ref 80.0–100.0)
Platelets: 250 10*3/uL (ref 150–400)
RBC: 2.96 MIL/uL — ABNORMAL LOW (ref 4.22–5.81)
RDW: 16.8 % — ABNORMAL HIGH (ref 11.5–15.5)
WBC: 10.6 10*3/uL — ABNORMAL HIGH (ref 4.0–10.5)
nRBC: 0 % (ref 0.0–0.2)

## 2020-01-27 LAB — MAGNESIUM: Magnesium: 2.1 mg/dL (ref 1.7–2.4)

## 2020-01-27 MED ORDER — IPRATROPIUM-ALBUTEROL 0.5-2.5 (3) MG/3ML IN SOLN: 3.0000 mL | RESPIRATORY_TRACT | Status: DC | PRN

## 2020-01-27 NOTE — Progress Notes (Signed)
Progress Note  Patient Name: Robert Little Date of Encounter: 01/27/2020  [facility] Cardiologist: Sanda Klein, MD   Subjective   Did not sleep well, but otherwise no complaints today. No chest pain. Can't tell if breathing is better.  Inpatient Medications    Scheduled Meds: . aspirin EC  81 mg Oral Daily  . atorvastatin  20 mg Oral QHS  . clonazePAM  0.5 mg Oral BID  . feeding supplement (ENSURE ENLIVE)  237 mL Oral BID BM  . ferrous sulfate  325 mg Oral BID WC  . fluticasone furoate-vilanterol  1 puff Inhalation Daily  . guaiFENesin  600 mg Oral BID  . heparin  5,000 Units Subcutaneous Q8H  . magnesium oxide  400 mg Oral Daily  . methylPREDNISolone (SOLU-MEDROL) injection  60 mg Intravenous Q12H  . multivitamin with minerals  1 tablet Oral Daily  . pantoprazole  40 mg Oral Daily  . QUEtiapine  25 mg Oral QHS  . senna-docusate  2 tablet Oral QHS  . tamsulosin  0.8 mg Oral QPC supper  . umeclidinium bromide  1 puff Inhalation Daily  . vitamin B-12  500 mcg Oral BID   Continuous Infusions:  PRN Meds: acetaminophen, albuterol, ipratropium-albuterol, ondansetron **OR** ondansetron (ZOFRAN) IV, polyethylene glycol   Vital Signs    Vitals:   01/27/20 0328 01/27/20 0626 01/27/20 0727 01/27/20 0744  BP: (!) 112/46   (!) 106/41  Pulse: 64   65  Resp:    20  Temp: 97.6 F (36.4 C)   98.7 F (37.1 C)  TempSrc: Oral     SpO2: 90%  91% 94%  Weight:  66.2 kg    Height:        Intake/Output Summary (Last 24 hours) at 01/27/2020 0918 Last data filed at 01/27/2020 0744 Gross per 24 hour  Intake --  Output 600 ml  Net -600 ml   Last 3 Weights 01/27/2020 01/26/2020 01/24/2020  Weight (lbs) 145 lb 15.1 oz 160 lb 15 oz 137 lb  Weight (kg) 66.2 kg 73 kg 62.143 kg      Telemetry    NSR - Personally Reviewed  ECG    No new since 01/24/20 - Personally Reviewed  Physical Exam   GEN: Frail, hard of hearing NECK: No JVD sitting upright in bed CARDIAC: regular  rhythm, normal S1 and S2, no rubs or gallops. No murmur nappreciated. VASCULAR: Radial pulses 2+ bilaterally.  RESPIRATORY:  Fine, diffuse coarseness but improved from yesterday, much less wheezing ABDOMEN: Soft, non-tender, non-distended MUSCULOSKELETAL:  Moves all 4 limbs independently SKIN: Warm and dry, no edema NEUROLOGIC:  No focal neuro deficits noted. PSYCHIATRIC:  Normal affect   Labs    High Sensitivity Troponin:   Recent Labs  Lab 01/24/20 0619 01/24/20 0833 01/24/20 1553 01/24/20 1752 01/24/20 2245  TROPONINIHS 1,831* 1,903* 785* 844* 941*      Chemistry Recent Labs  Lab 01/23/20 2343 01/23/20 2343 01/24/20 1610 01/24/20 0619 01/25/20 0728 01/26/20 0357 01/27/20 0338  NA 139   < > 140   < > 138 138 135  K 4.5   < > 4.8   < > 4.4 4.2 5.0  CL 103   < > 105   < > 99 99 97*  CO2 24   < > 23   < > 28 27 25   GLUCOSE 219*   < > 121*   < > 135* 121* 206*  BUN 26*   < > 25*   < >  32* 38* 51*  CREATININE 2.51*   < > 2.47*   < > 2.99* 2.98* 3.06*  CALCIUM 9.0   < > 8.4*   < > 8.8* 8.7* 9.1  PROT 7.7  --  6.0*  --   --   --   --   ALBUMIN 3.0*  --  2.5*  --   --   --   --   AST 14*  --  15  --   --   --   --   ALT 11  --  9  --   --   --   --   ALKPHOS 86  --  67  --   --   --   --   BILITOT 0.7  --  1.0  --   --   --   --   GFRNONAA 24*   < > 24*   < > 19* 19* 19*  GFRAA 28*   < > 28*   < > 22* 22* 22*  ANIONGAP 12   < > 12   < > 11 12 13    < > = values in this interval not displayed.     Hematology Recent Labs  Lab 01/25/20 0728 01/26/20 0357 01/27/20 0338  WBC 8.5 8.2 10.6*  RBC 2.95* 2.70* 2.96*  HGB 8.0* 7.5* 8.0*  HCT 26.6* 24.1* 26.0*  MCV 90.2 89.3 87.8  MCH 27.1 27.8 27.0  MCHC 30.1 31.1 30.8  RDW 17.7* 17.5* 16.8*  PLT 211 223 250    BNP Recent Labs  Lab 01/23/20 2343 01/25/20 0733  BNP 836.2* 549.0*     DDimer  Recent Labs  Lab 01/25/20 0746  DDIMER 2.12*     Radiology    CT CHEST WO CONTRAST  Result Date:  01/26/2020 CLINICAL DATA:  79 year old male with history of extreme shortness of breath and cough. Evaluate for interstitial lung disease. EXAM: CT CHEST WITHOUT CONTRAST TECHNIQUE: Multidetector CT imaging of the chest was performed following the standard protocol without IV contrast. COMPARISON:  Chest CT 09/19/2019. FINDINGS: Cardiovascular: Heart size is normal. There is no significant pericardial fluid, thickening or pericardial calcification. There is aortic atherosclerosis, as well as atherosclerosis of the great vessels of the mediastinum and the coronary arteries, including calcified atherosclerotic plaque in the left main, left anterior descending, left circumflex and right coronary arteries. Calcifications of the aortic valve. Mediastinum/Nodes: No pathologically enlarged mediastinal or hilar lymph nodes. Multiple prominent borderline enlarged mediastinal and hilar lymph nodes are noted, similar to the prior study, presumably chronic and reactive. Esophagus is unremarkable in appearance. No axillary lymphadenopathy. Lungs/Pleura: High-resolution imaging was not performed. With these limitations in mind there is again widespread ground-glass attenuation, septal thickening, thickening of the peribronchovascular interstitium, traction bronchiectasis and some areas of honeycombing in the lungs bilaterally. These findings have a craniocaudal gradient and appear minimally progressive compared to the prior study. No new acute consolidative airspace disease. Trace amount of left pleural fluid and/or thickening, similar to the prior study. Upper Abdomen: Aortic atherosclerosis. Musculoskeletal: Median sternotomy wires. There are no aggressive appearing lytic or blastic lesions noted in the visualized portions of the skeleton. IMPRESSION: 1. The appearance of the lungs is compatible with interstitial lung disease, with a spectrum of findings considered diagnostic of usual interstitial pneumonia (UIP) per current  ATS guidelines. Minimal progression compared to the prior study. 2. Aortic atherosclerosis, in addition to left main and 3 vessel coronary artery disease. Assessment for potential risk factor  modification, dietary therapy or pharmacologic therapy may be warranted, if clinically indicated. 3. There are calcifications of the aortic valve. Echocardiographic correlation for evaluation of potential valvular dysfunction may be warranted if clinically indicated. 4. Additional incidental findings, as above. Aortic Atherosclerosis (ICD10-I70.0). Electronically Signed   By: Vinnie Langton M.D.   On: 01/26/2020 11:17   NM Pulmonary Perfusion  Result Date: 01/25/2020 CLINICAL DATA:  Suspected pulmonary embolism, positive D-dimer, pulmonary fibrosis and COVID pneumonia 2 months prior now with shortness of breath EXAM: NUCLEAR MEDICINE PERFUSION LUNG SCAN TECHNIQUE: Perfusion images were obtained in multiple projections after intravenous injection of radiopharmaceutical. Ventilation scans intentionally deferred if perfusion scan and chest x-ray adequate for interpretation during COVID 19 epidemic. RADIOPHARMACEUTICALS:  3.7 mCi Tc-13m MAA IV COMPARISON:  Radiograph 01/25/2020, CT imaging 09/19/2019. FINDINGS: Nonsegmental perfusion defects in the left and right lung appear to correspond to the areas of more pronounced fibrotic interstitial change seen on the comparison CT which remains present on today's radiographic examination corresponding with a mismatch defect on perfusion only imaging. No other discernible perfusion defects. IMPRESSION: Per modified PIOPED II perfusion only criteria, is the presence of the nonsegmental perfusion defects with CT/radiographic correlate should be interpreted as a normal exam to very low probability of pulmonary embolism. Electronically Signed   By: Lovena Le M.D.   On: 01/25/2020 18:12   VAS [place] LOWER EXTREMITY VENOUS (DVT)  Result Date: 01/26/2020  Lower Venous DVTStudy  Indications: SOB, and Swelling.  Comparison Study: 09-17-2019 LEV Bilateral- no DVT Performing Technologist: Darlin Coco  Examination Guidelines: A complete evaluation includes B-mode imaging, spectral Doppler, color Doppler, and power Doppler as needed of all accessible portions of each vessel. Bilateral testing is considered an integral part of a complete examination. Limited examinations for reoccurring indications may be performed as noted. The reflux portion of the exam is performed with the patient in reverse Trendelenburg.  +---------+---------------+---------+-----------+----------+--------------+ RIGHT    CompressibilityPhasicitySpontaneityPropertiesThrombus Aging +---------+---------------+---------+-----------+----------+--------------+ CFV      Full           Yes      Yes                                 +---------+---------------+---------+-----------+----------+--------------+ SFJ      Full                                                        +---------+---------------+---------+-----------+----------+--------------+ FV Prox  Full                                                        +---------+---------------+---------+-----------+----------+--------------+ FV Mid   Full                                                        +---------+---------------+---------+-----------+----------+--------------+ FV DistalFull                                                        +---------+---------------+---------+-----------+----------+--------------+  PFV      Full                                                        +---------+---------------+---------+-----------+----------+--------------+ POP      Full           Yes      Yes                                 +---------+---------------+---------+-----------+----------+--------------+ PTV      Full                                                         +---------+---------------+---------+-----------+----------+--------------+ PERO     Full                                                        +---------+---------------+---------+-----------+----------+--------------+   +---------+---------------+---------+-----------+----------+--------------+ LEFT     CompressibilityPhasicitySpontaneityPropertiesThrombus Aging +---------+---------------+---------+-----------+----------+--------------+ CFV      Full           Yes      Yes                                 +---------+---------------+---------+-----------+----------+--------------+ SFJ      Full                                                        +---------+---------------+---------+-----------+----------+--------------+ FV Prox  Full                                                        +---------+---------------+---------+-----------+----------+--------------+ FV Mid   Full                                                        +---------+---------------+---------+-----------+----------+--------------+ FV DistalFull                                                        +---------+---------------+---------+-----------+----------+--------------+ PFV      Full                                                        +---------+---------------+---------+-----------+----------+--------------+  POP      Full           Yes      Yes                                 +---------+---------------+---------+-----------+----------+--------------+ PTV      Full                                                        +---------+---------------+---------+-----------+----------+--------------+ PERO     Full                                                        +---------+---------------+---------+-----------+----------+--------------+     Summary: RIGHT: - There is no evidence of deep vein thrombosis in the lower extremity.  - No cystic structure found in  the popliteal fossa.  LEFT: - There is no evidence of deep vein thrombosis in the lower extremity.  - No cystic structure found in the popliteal fossa.  *See table(s) above for measurements and observations. Electronically signed by Monica Martinez MD on 01/26/2020 at 11:54:49 AM.    Final     Cardiac Studies   2D echo 01/24/20  1. Left ventricular ejection fraction, by estimation, is 60 to 65%. The  left ventricle has normal function. The left ventricle has no regional  wall motion abnormalities. There is mild left ventricular hypertrophy.  Left ventricular diastolic parameters  are consistent with Grade II diastolic dysfunction (pseudonormalization).  Elevated left atrial pressure.  2. Right ventricular systolic function is normal. The right ventricular  size is mildly enlarged. Tricuspid regurgitation signal is inadequate for  assessing PA pressure.  3. The mitral valve is normal in structure. Mild mitral valve  regurgitation.  4. The aortic valve is tricuspid. Aortic valve regurgitation is not  visualized. Mild to moderate aortic valve sclerosis/calcification is  present, without any evidence of aortic stenosis.   Patient Profile     79 y.o. male with a hx of CAD s/p prior CABG([facility] 02/2004),DM2,HTN, HLD, pulmonary fibrosiswith chronic respiratory failure onhome O2,Covid-19 PNA 11/2019,schizoaffective disorderin a group home, CKD IV, anemia of chronic disease,GERD, chronic diastolic CHF, lung nodule, extensive tobacco history, dysphagia, chronic appearing anemiawho is being followed for the evaluation of elevated troponin/heart failureat the request of Dr. Reesa Chew.  Assessment & Plan    Acute on chronic hypoxic respiratory failure: -Pt has multifactorial underlying pulmonary fibrosis, acute on chronic diastolic heart failure, and h/o Covid-19 PNA in 11/2019 -Elevated D-dimer, VQ scan low probability -suspect this is not primarily volume driven, see below, given that he has  had minimal improvement with diuresis and his renal function continues to worsen -was previously treated for pneumonia, procalcitonin negative, antibiotics stopped -appreciate pulmonology input, started treatment for possible amiodarone induced lung toxicity with steroids 01/26/20. Lung exam improved today.  Acute on chronic diastolic heart failure -LVEF preserved as above -BNP 549 on admission -admission weight 62.1 kg, weight today 66.2 kg (but listed as 73 kg yesterday). Suspect inaccurate. No output charted since admission. -creatinine continues to worsen without clinic improvement, BUN also rising. Hold diuretics.  Elevated troponin in the context of known CAD s/p CABG 2005 -hsTroponins [phone] -Reassuring echo  -Continue ASA, statin (LDL 41 in 09/2019)  CKD stage IV -Creatinine 3.06 today  -hold diuretics  Acute on chronic anemia -Baseline Hgb range 6-9 -Hgb 8.0 today, stable  Abnormal TSH  -Amiodarone discontinued  -will need monitored by PCP  Paroxysmal atrial fibrillation, diagnosed 11/2019 in context of Covid -in NSR currently -CHA2DS2/VAS Stroke Risk Points=5, but not on anticoagulation due to anemia and risk of falls per prior discussion -amiodarone stopped this admission given worsening interstitial lung findings  For questions or updates, please contact [facility] Please consult [email or web address] for contact info under    Signed, Buford Dresser, MD  01/27/2020, 9:18 AM

## 2020-01-27 NOTE — Progress Notes (Signed)
PROGRESS NOTE    Robert Little  TKZ:[id number] DOB: [birth year] DOA: 01/23/2020 PCP: [name]   Brief Narrative:  79 year old with history of pulmonary fibrosis, paroxysmal A. fib, diastolic CHF, CKD stage IV, anemia of chronic disease, COVID-19 pneumonia 2 months ago admitted for shortness of breath form SNF.  Prior to admission was treated with 5 days of empiric antibiotic for presumed pneumonia.  Upon admission he was quite hypoxic with concerns for CHF exacerbation versus pneumonia.  Initially had mild volume overload but eventually working diagnosis was UIP flare versus amiodarone toxicity.   Assessment & Plan:   Principal Problem:   Acute on chronic respiratory failure with hypoxia (HCC) Active Problems:   Essential hypertension   Pulmonary fibrosis (HCC)   Schizo affective schizophrenia ([place])   CKD (chronic kidney disease), stage IV (HCC)   Acute on chronic diastolic CHF (congestive heart failure) (HCC)   HCAP (healthcare-associated pneumonia)   Pressure injury of skin   Acute renal failure with acute renal cortical necrosis superimposed on stage 4 chronic kidney disease (HCC)   Malnutrition of moderate degree   Elevated troponin   Paroxysmal atrial fibrillation (HCC)  Acute hypoxic respiratory failure,worsening on 15 L high flow Chronic congestive heart failure with preserved ejection fraction, 65% Presumed bilateral lower lobe pneumonia at admission Interstitial pulmonary fibrosis, amiodarone toxicity -Continue IV Solu-Medrol, aggressive bronchodilators -CT chest shows extensive fibrocystic disease, DGC. -Pulmonary following.  Eventually may need bronchoscopy -Echocardiogram 11/2019-EF 60 to 57%, grade 2 diastolic dysfunction -Cardiology following -Pro-Cal negative-antibiotics stopped -Wean off oxygen as deemed appropriate. -Respiratory viral panel-negative -Lower extremity Dopplers-negative.  VQ scan-some mismatch defect secondary to fibrosis but  no evidence of filling defect concerning for PE. -Chest x-ray 7/23-shows extensive airspace opacity chronic interstitial lung disease  Elevated troponin, suspect demand ischemia -Demand ischemia but should get further evaluation once medically stable. Echocardiogram shows EF 60 to 65%, grade 2 DD  Atrial fibrillation, paroxysmal -For now amiodarone has been discontinued -No anticoagulation due to risk of falls  Renal insufficiency chronic kidney disease stage IV -Around baseline creatinine of 2.4.  Continue to monitor. Creatinine 3.06  Anemia of chronic disease, hemoglobin 8.0 -Hemoglobin appears to be stable without any evidence of bleeding -Continue aspirin, iron supplements. Will transfuse as necessary  GERD -PPI  History of COPD History of pulmonary fibrosis with chronic hypoxia on 2 L nasal cannula -Aggressive bronchodilators, incentive spirometer and flutter valve.  History of schizoaffective disorder -Seroquel  BPH -Flomax  Caregiver understands his critical illness.  Currently wishes to be full code but open to further discussion in the future  DVT prophylaxis: heparin injection 5,000 Units Start: 01/24/20 0600 Code Status: Full Family Communication:  Spoke with [name] (Care giver, legal guardian) on 7/24.   Status is: Inpatient  Remains inpatient appropriate because:IV treatments appropriate due to intensity of illness or inability to take PO  Dispo: The patient is from: Home              Anticipated d/c is to: Home              Anticipated d/c date is: Greater than 3 days              Patient currently is not medically stable to d/c.  Still requiring significant amount of oxygen.  Body mass index is 22.19 kg/m.  Pressure Injury 11/27/19 Ear Left Stage 2 -  Partial thickness loss of dermis presenting as a shallow open injury with a red, pink  wound bed without slough. Red/pink (Active)  11/27/19 1350  Location: Ear  Location Orientation: Left   Staging: Stage 2 -  Partial thickness loss of dermis presenting as a shallow open injury with a red, pink wound bed without slough.  Wound Description (Comments): Red/pink  Present on Admission: No     Pressure Injury 01/24/20 Buttocks Mid Stage 1 -  Intact skin with non-blanchable redness of a localized area usually over a bony prominence. (Active)  01/24/20 2300  Location: Buttocks  Location Orientation: Mid  Staging: Stage 1 -  Intact skin with non-blanchable redness of a localized area usually over a bony prominence.  Wound Description (Comments):   Present on Admission: Yes   Subjective: Seems a little better to me this morning, shortness of breath slightly improved.  I was able to wean down his 10 L high flow nasal cannula to 7 L high flow nasal cannula whilst in the room.  His oxygen saturation maintain greater than 90%.  Review of Systems General: Denies fever, chills, night sweats or unintended weight loss. Resp: Denies hemoptysis Cardiac: Denies chest pain, palpitations, orthopnea, paroxysmal nocturnal dyspnea. GI: Denies abdominal pain, nausea, vomiting, diarrhea or constipation GU: Denies dysuria, frequency, hesitancy or incontinence MS: Denies muscle aches, joint pain or swelling Neuro: Denies headache, neurologic deficits (focal weakness, numbness, tingling), abnormal gait Psych: Denies anxiety, depression, SI/HI/AVH Skin: Denies new rashes or lesions ID: Denies sick contacts, exotic exposures, travel  Examination: Constitutional: Not in acute distress, 7 L high flow nasal cannula, chronically appears ill Respiratory: Bilateral coarse breath sounds but not wheezing anymore. Cardiovascular: Normal sinus rhythm, no rubs Abdomen: Nontender nondistended good bowel sounds Musculoskeletal: No edema noted Skin: No rashes seen Neurologic: CN 2-12 grossly intact.  And nonfocal Psychiatric: Normal judgment and insight. Alert and oriented x 3. Normal  mood.   Objective: Vitals:   01/26/20 2042 01/26/20 2318 01/27/20 0328 01/27/20 0626  BP:  (!) 118/51 (!) 112/46   Pulse:  65 64   Resp:      Temp:  98.3 F (36.8 C) 97.6 F (36.4 C)   TempSrc:  Oral Oral   SpO2: 95% 100% 90%   Weight:    66.2 kg  Height:        Intake/Output Summary (Last 24 hours) at 01/27/2020 0724 Last data filed at 01/26/2020 1100 Gross per 24 hour  Intake 120 ml  Output 200 ml  Net -80 ml   Filed Weights   01/24/20 1014 01/26/20 0657 01/27/20 0626  Weight: 62.1 kg 73 kg 66.2 kg   Data Reviewed:   CBC: Recent Labs  Lab 01/23/20 2343 01/24/20 0619 01/25/20 0728 01/26/20 0357 01/27/20 0338  WBC 9.0 8.3 8.5 8.2 10.6*  NEUTROABS 7.3 5.1  --   --   --   HGB 8.2* 7.3* 8.0* 7.5* 8.0*  HCT 27.8* 25.2* 26.6* 24.1* 26.0*  MCV 93.6 95.1 90.2 89.3 87.8  PLT 198 177 211 223 408   Basic Metabolic Panel: Recent Labs  Lab 01/23/20 2343 01/24/20 0619 01/25/20 0728 01/26/20 0357 01/27/20 0338  NA 139 140 138 138 135  K 4.5 4.8 4.4 4.2 5.0  CL 103 105 99 99 97*  CO2 24 23 28 27 25   GLUCOSE 219* 121* 135* 121* 206*  BUN 26* 25* 32* 38* 51*  CREATININE 2.51* 2.47* 2.99* 2.98* 3.06*  CALCIUM 9.0 8.4* 8.8* 8.7* 9.1  MG  --   --  1.8 1.9 2.1   GFR: Estimated Creatinine Clearance: 18.9  mL/min (A) (by C-G formula based on SCr of 3.06 mg/dL (H)). Liver Function Tests: Recent Labs  Lab 01/23/20 2343 01/24/20 0619  AST 14* 15  ALT 11 9  ALKPHOS 86 67  BILITOT 0.7 1.0  PROT 7.7 6.0*  ALBUMIN 3.0* 2.5*   No results for input(s): LIPASE, AMYLASE in the last 168 hours. No results for input(s): AMMONIA in the last 168 hours. Coagulation Profile: Recent Labs  Lab 01/24/20 0049  INR 1.1   Cardiac Enzymes: Recent Labs  Lab 01/24/20 0619  CKTOTAL 129   BNP (last 3 results) No results for input(s): PROBNP in the last 8760 hours. HbA1C: No results for input(s): HGBA1C in the last 72 hours. CBG: No results for input(s): GLUCAP in the last  168 hours. Lipid Profile: No results for input(s): CHOL, HDL, LDLCALC, TRIG, CHOLHDL, LDLDIRECT in the last 72 hours. Thyroid Function Tests: Recent Labs    01/24/20 1553  FREET4 0.84   Anemia Panel: No results for input(s): VITAMINB12, FOLATE, FERRITIN, TIBC, IRON, RETICCTPCT in the last 72 hours. Sepsis Labs: Recent Labs  Lab 01/23/20 2343 01/24/20 0619 01/24/20 0833  PROCALCITON  --   --  <0.10  LATICACIDVEN 2.1* 1.8 2.0*    Recent Results (from the past 240 hour(s))  Culture, blood (routine x 2)     Status: None (Preliminary result)   Collection Time: 01/23/20 11:45 PM   Specimen: BLOOD  Result Value Ref Range Status   Specimen Description BLOOD RIGHT ANTECUBITAL  Final   Special Requests   Final    BOTTLES DRAWN AEROBIC AND ANAEROBIC Blood Culture adequate volume   Culture   Final    NO GROWTH 3 DAYS Performed at [facility], [address] [place], [place] [address]    Report Status PENDING  Incomplete  Culture, blood (routine x 2)     Status: None (Preliminary result)   Collection Time: 01/23/20 11:47 PM   Specimen: BLOOD  Result Value Ref Range Status   Specimen Description BLOOD LEFT ANTECUBITAL  Final   Special Requests   Final    BOTTLES DRAWN AEROBIC AND ANAEROBIC Blood Culture adequate volume   Culture   Final    NO GROWTH 3 DAYS Performed at [facility], [place] [address], [place], [place] [address]    Report Status PENDING  Incomplete  Urine culture     Status: None   Collection Time: 01/24/20  2:58 AM   Specimen: Urine, Random  Result Value Ref Range Status   Specimen Description URINE, RANDOM  Final   Special Requests NONE  Final   Culture   Final    NO GROWTH Performed at [facility], [place] [address], [place], [place] [address]    Report Status 01/24/2020 FINAL  Final  Respiratory Panel by PCR     Status: None   Collection Time: 01/25/20 12:36 PM   Specimen: Nasopharyngeal Swab; Respiratory  Result Value Ref Range Status    Adenovirus NOT DETECTED NOT DETECTED Final   Coronavirus 229E NOT DETECTED NOT DETECTED Final    Comment: (NOTE) The Coronavirus on the Respiratory Panel, DOES NOT test for the novel  Coronavirus (2019 nCoV)    Coronavirus HKU1 NOT DETECTED NOT DETECTED Final   Coronavirus NL63 NOT DETECTED NOT DETECTED Final   Coronavirus OC43 NOT DETECTED NOT DETECTED Final   Metapneumovirus NOT DETECTED NOT DETECTED Final   Rhinovirus / Enterovirus NOT DETECTED NOT DETECTED Final   Influenza A NOT  DETECTED NOT DETECTED Final   Influenza B NOT DETECTED NOT DETECTED Final   Parainfluenza Virus 1 NOT DETECTED NOT DETECTED Final   Parainfluenza Virus 2 NOT DETECTED NOT DETECTED Final   Parainfluenza Virus 3 NOT DETECTED NOT DETECTED Final   Parainfluenza Virus 4 NOT DETECTED NOT DETECTED Final   Respiratory Syncytial Virus NOT DETECTED NOT DETECTED Final   Bordetella pertussis NOT DETECTED NOT DETECTED Final   Chlamydophila pneumoniae NOT DETECTED NOT DETECTED Final   Mycoplasma pneumoniae NOT DETECTED NOT DETECTED Final    Comment: Performed at [facility], [place] [address], [place], [place] [address]     Radiology Studies: CT CHEST WO CONTRAST  Result Date: 01/26/2020 CLINICAL DATA:  79 year old male with history of extreme shortness of breath and cough. Evaluate for interstitial lung disease. EXAM: CT CHEST WITHOUT CONTRAST TECHNIQUE: Multidetector CT imaging of the chest was performed following the standard protocol without IV contrast. COMPARISON:  Chest CT 09/19/2019. FINDINGS: Cardiovascular: Heart size is normal. There is no significant pericardial fluid, thickening or pericardial calcification. There is aortic atherosclerosis, as well as atherosclerosis of the great vessels of the mediastinum and the coronary arteries, including calcified atherosclerotic plaque in the left main, left anterior descending, left circumflex and right coronary arteries. Calcifications of the aortic valve.  Mediastinum/Nodes: No pathologically enlarged mediastinal or hilar lymph nodes. Multiple prominent borderline enlarged mediastinal and hilar lymph nodes are noted, similar to the prior study, presumably chronic and reactive. Esophagus is unremarkable in appearance. No axillary lymphadenopathy. Lungs/Pleura: High-resolution imaging was not performed. With these limitations in mind there is again widespread ground-glass attenuation, septal thickening, thickening of the peribronchovascular interstitium, traction bronchiectasis and some areas of honeycombing in the lungs bilaterally. These findings have a craniocaudal gradient and appear minimally progressive compared to the prior study. No new acute consolidative airspace disease. Trace amount of left pleural fluid and/or thickening, similar to the prior study. Upper Abdomen: Aortic atherosclerosis. Musculoskeletal: Median sternotomy wires. There are no aggressive appearing lytic or blastic lesions noted in the visualized portions of the skeleton. IMPRESSION: 1. The appearance of the lungs is compatible with interstitial lung disease, with a spectrum of findings considered diagnostic of usual interstitial pneumonia (UIP) per current ATS guidelines. Minimal progression compared to the prior study. 2. Aortic atherosclerosis, in addition to left main and 3 vessel coronary artery disease. Assessment for potential risk factor modification, dietary therapy or pharmacologic therapy may be warranted, if clinically indicated. 3. There are calcifications of the aortic valve. Echocardiographic correlation for evaluation of potential valvular dysfunction may be warranted if clinically indicated. 4. Additional incidental findings, as above. Aortic Atherosclerosis (ICD10-I70.0). Electronically Signed   By: Vinnie Langton M.D.   On: 01/26/2020 11:17   NM Pulmonary Perfusion  Result Date: 01/25/2020 CLINICAL DATA:  Suspected pulmonary embolism, positive D-dimer, pulmonary  fibrosis and COVID pneumonia 2 months prior now with shortness of breath EXAM: NUCLEAR MEDICINE PERFUSION LUNG SCAN TECHNIQUE: Perfusion images were obtained in multiple projections after intravenous injection of radiopharmaceutical. Ventilation scans intentionally deferred if perfusion scan and chest x-ray adequate for interpretation during COVID 19 epidemic. RADIOPHARMACEUTICALS:  3.7 mCi Tc-31m MAA IV COMPARISON:  Radiograph 01/25/2020, CT imaging 09/19/2019. FINDINGS: Nonsegmental perfusion defects in the left and right lung appear to correspond to the areas of more pronounced fibrotic interstitial change seen on the comparison CT which remains present on today's radiographic examination corresponding with a mismatch defect on perfusion only imaging. No other discernible perfusion defects. IMPRESSION: Per modified PIOPED  II perfusion only criteria, is the presence of the nonsegmental perfusion defects with CT/radiographic correlate should be interpreted as a normal exam to very low probability of pulmonary embolism. Electronically Signed   By: Lovena Le M.D.   On: 01/25/2020 18:12   DG Chest Port 1 View  Result Date: 01/25/2020 CLINICAL DATA:  Dyspnea. EXAM: PORTABLE CHEST 1 VIEW COMPARISON:  Prior chest radiograph 01/23/2020 and earlier, chest CT 11/19/2019 FINDINGS: Prior median sternotomy and CABG. Redemonstrated fractures within multiple sternotomy wires. Heart size at the upper limits of normal, unchanged. Similar appearance of extensive bilateral airspace opacities superimposed upon a background of known chronic interstitial lung disease. No evidence of pneumothorax. Trace bilateral pleural effusions are difficult to exclude. No acute bony abnormality identified IMPRESSION: No significant change as compared to the chest radiograph 01/23/2020. Redemonstrated extensive airspace opacities superimposed upon a background of chronic interstitial lung disease. Trace bilateral pleural effusions are  difficult to exclude. Electronically Signed   By: Kellie Simmering DO   On: 01/25/2020 08:01   VAS [place] LOWER EXTREMITY VENOUS (DVT)  Result Date: 01/26/2020  Lower Venous DVTStudy Indications: SOB, and Swelling.  Comparison Study: 09-17-2019 LEV Bilateral- no DVT Performing Technologist: Darlin Coco  Examination Guidelines: A complete evaluation includes B-mode imaging, spectral Doppler, color Doppler, and power Doppler as needed of all accessible portions of each vessel. Bilateral testing is considered an integral part of a complete examination. Limited examinations for reoccurring indications may be performed as noted. The reflux portion of the exam is performed with the patient in reverse Trendelenburg.  +---------+---------------+---------+-----------+----------+--------------+ RIGHT    CompressibilityPhasicitySpontaneityPropertiesThrombus Aging +---------+---------------+---------+-----------+----------+--------------+ CFV      Full           Yes      Yes                                 +---------+---------------+---------+-----------+----------+--------------+ SFJ      Full                                                        +---------+---------------+---------+-----------+----------+--------------+ FV Prox  Full                                                        +---------+---------------+---------+-----------+----------+--------------+ FV Mid   Full                                                        +---------+---------------+---------+-----------+----------+--------------+ FV DistalFull                                                        +---------+---------------+---------+-----------+----------+--------------+ PFV      Full                                                        +---------+---------------+---------+-----------+----------+--------------+  POP      Full           Yes      Yes                                  +---------+---------------+---------+-----------+----------+--------------+ PTV      Full                                                        +---------+---------------+---------+-----------+----------+--------------+ PERO     Full                                                        +---------+---------------+---------+-----------+----------+--------------+   +---------+---------------+---------+-----------+----------+--------------+ LEFT     CompressibilityPhasicitySpontaneityPropertiesThrombus Aging +---------+---------------+---------+-----------+----------+--------------+ CFV      Full           Yes      Yes                                 +---------+---------------+---------+-----------+----------+--------------+ SFJ      Full                                                        +---------+---------------+---------+-----------+----------+--------------+ FV Prox  Full                                                        +---------+---------------+---------+-----------+----------+--------------+ FV Mid   Full                                                        +---------+---------------+---------+-----------+----------+--------------+ FV DistalFull                                                        +---------+---------------+---------+-----------+----------+--------------+ PFV      Full                                                        +---------+---------------+---------+-----------+----------+--------------+ POP      Full           Yes      Yes                                 +---------+---------------+---------+-----------+----------+--------------+  PTV      Full                                                        +---------+---------------+---------+-----------+----------+--------------+ PERO     Full                                                         +---------+---------------+---------+-----------+----------+--------------+     Summary: RIGHT: - There is no evidence of deep vein thrombosis in the lower extremity.  - No cystic structure found in the popliteal fossa.  LEFT: - There is no evidence of deep vein thrombosis in the lower extremity.  - No cystic structure found in the popliteal fossa.  *See table(s) above for measurements and observations. Electronically signed by Monica Martinez MD on 01/26/2020 at 11:54:49 AM.    Final         Scheduled Meds: . aspirin EC  81 mg Oral Daily  . atorvastatin  20 mg Oral QHS  . clonazePAM  0.5 mg Oral BID  . feeding supplement (ENSURE ENLIVE)  237 mL Oral BID BM  . ferrous sulfate  325 mg Oral BID WC  . fluticasone furoate-vilanterol  1 puff Inhalation Daily  . guaiFENesin  600 mg Oral BID  . heparin  5,000 Units Subcutaneous Q8H  . magnesium oxide  400 mg Oral Daily  . methylPREDNISolone (SOLU-MEDROL) injection  60 mg Intravenous Q12H  . multivitamin with minerals  1 tablet Oral Daily  . pantoprazole  40 mg Oral Daily  . QUEtiapine  25 mg Oral QHS  . senna-docusate  2 tablet Oral QHS  . tamsulosin  0.8 mg Oral QPC supper  . umeclidinium bromide  1 puff Inhalation Daily  . vitamin B-12  500 mcg Oral BID   Continuous Infusions:    LOS: 3 days   Time spent= 35 mins  Neola Worrall Arsenio Loader, MD [facility]  If 7PM-7AM, please contact night-coverage  01/27/2020, 7:24 AM

## 2020-01-28 DIAGNOSIS — I219 Acute myocardial infarction, unspecified: Secondary | ICD-10-CM

## 2020-01-28 DIAGNOSIS — J8410 Pulmonary fibrosis, unspecified: Secondary | ICD-10-CM

## 2020-01-28 DIAGNOSIS — F259 Schizoaffective disorder, unspecified: Secondary | ICD-10-CM

## 2020-01-28 LAB — CBC
HCT: 24.4 % — ABNORMAL LOW (ref 39.0–52.0)
Hemoglobin: 7.7 g/dL — ABNORMAL LOW (ref 13.0–17.0)
MCH: 27.8 pg (ref 26.0–34.0)
MCHC: 31.6 g/dL (ref 30.0–36.0)
MCV: 88.1 fL (ref 80.0–100.0)
Platelets: 253 10*3/uL (ref 150–400)
RBC: 2.77 MIL/uL — ABNORMAL LOW (ref 4.22–5.81)
RDW: 16.8 % — ABNORMAL HIGH (ref 11.5–15.5)
WBC: 13.8 10*3/uL — ABNORMAL HIGH (ref 4.0–10.5)
nRBC: 0 % (ref 0.0–0.2)

## 2020-01-28 LAB — BASIC METABOLIC PANEL
Anion gap: 11 (ref 5–15)
BUN: 66 mg/dL — ABNORMAL HIGH (ref 8–23)
CO2: 25 mmol/L (ref 22–32)
Calcium: 9.1 mg/dL (ref 8.9–10.3)
Chloride: 95 mmol/L — ABNORMAL LOW (ref 98–111)
Creatinine, Ser: 2.99 mg/dL — ABNORMAL HIGH (ref 0.61–1.24)
GFR calc Af Amer: 22 mL/min — ABNORMAL LOW (ref >60)
GFR calc non Af Amer: 19 mL/min — ABNORMAL LOW (ref >60)
Glucose, Bld: 305 mg/dL — ABNORMAL HIGH (ref 70–99)
Potassium: 4.9 mmol/L (ref 3.5–5.1)
Sodium: 131 mmol/L — ABNORMAL LOW (ref 135–145)

## 2020-01-28 LAB — GLUCOSE, CAPILLARY
Glucose-Capillary: 453 mg/dL — ABNORMAL HIGH (ref 70–99)
Glucose-Capillary: 353 mg/dL — ABNORMAL HIGH (ref 70–99)
Glucose-Capillary: 233 mg/dL — ABNORMAL HIGH (ref 70–99)

## 2020-01-28 LAB — MAGNESIUM: Magnesium: 1.9 mg/dL (ref 1.7–2.4)

## 2020-01-28 MED ORDER — INSULIN ASPART 100 UNIT/ML ~~LOC~~ SOLN
0.0000 [IU] | Freq: Three times a day (TID) | SUBCUTANEOUS | Status: DC
  Administered 2020-01-28: 5 [IU] via SUBCUTANEOUS
  Administered 2020-01-28: 10 [IU] via SUBCUTANEOUS
  Administered 2020-01-29: 5 [IU] via SUBCUTANEOUS
  Administered 2020-01-29: 3 [IU] via SUBCUTANEOUS
  Administered 2020-01-29: 2 [IU] via SUBCUTANEOUS
  Administered 2020-01-30: 5 [IU] via SUBCUTANEOUS

## 2020-01-28 MED ORDER — PREDNISONE 20 MG PO TABS
40.0000 mg | ORAL_TABLET | Freq: Every day | ORAL | Status: DC
  Administered 2020-01-29 – 2020-01-31 (×3): 40 mg via ORAL
  Filled 2020-01-28 (×4): qty 2

## 2020-01-28 MED ORDER — PANTOPRAZOLE SODIUM 40 MG PO TBEC
40.0000 mg | DELAYED_RELEASE_TABLET | Freq: Two times a day (BID) | ORAL | Status: DC
  Administered 2020-01-28 – 2020-01-31 (×7): 40 mg via ORAL
  Filled 2020-01-28 (×7): qty 1

## 2020-01-28 MED ORDER — METHYLPREDNISOLONE SODIUM SUCC 40 MG IJ SOLR
40.0000 mg | Freq: Two times a day (BID) | INTRAMUSCULAR | Status: DC
  Administered 2020-01-28: 40 mg via INTRAVENOUS
  Filled 2020-01-28: qty 1

## 2020-01-28 MED ORDER — INSULIN ASPART 100 UNIT/ML ~~LOC~~ SOLN
0.0000 [IU] | Freq: Every day | SUBCUTANEOUS | Status: DC
  Administered 2020-01-28: 2 [IU] via SUBCUTANEOUS
  Administered 2020-01-29 – 2020-01-30 (×2): 4 [IU] via SUBCUTANEOUS

## 2020-01-28 NOTE — Progress Notes (Signed)
NAME:  Robert Little, MRN:  [id number], DOB:  July 28, 1940, LOS: 4 ADMISSION DATE:  01/23/2020, CONSULTATION DATE:  7/24 REFERRING MD:  Reesa Chew, CHIEF COMPLAINT:  Dyspnea   Brief History   79 yo male former smoker presented with progressive dyspnea and hypoxia.  Has hx of pulmonary fibrosis with UIP complicated by COVID 19 infection in May 2021.  Also has aspiration pneumonitis.  PCCM asked to assist with respiratory management.  Past Medical History  Schizoaffective schizophrenia Hypertension GERD Diabetes mellitus Coronary artery disease COVID-19 pneumonia and May 2021 CKD stage IV Chronic respiratory failure with hypoxemia on 2 L nasal cannula since March 2021 Atrial fibrillation diagnosed while hospitalized in May 2021 for COVID-19 Dysphagia with silent aspiration seen in May 2021 modified barium swallow  Significant Hospital Events   7/21 Admit 7/24 start steroids  Consults:    Procedures:    Significant Diagnostic Tests:   Echo 7/22 >> EF 60 to 65%, grade 2 DD, mild MR  V/Q scan 7/23 >> very low probability for PE  CT chest 7/24 >> atherosclerosis, widespread GGO/septal thickening/traction BTX, some areas of honeycombing b/l  Micro Data:  Blood 7/21 >>  RVP 7/23 >> negative  Antimicrobials:  7/21 cefepime x1 7/21 doxycycline x1 7/21 vancomycin x1  Interim history/subjective:  O2 needs improved.  Wants to know if his blood sugar is up because of steroids.  Objective   Blood pressure (!) 126/42, pulse 57, temperature (!) 97.5 F (36.4 C), resp. rate 17, height 5\' 8"  (1.727 m), weight 66.2 kg, SpO2 99 %.        Intake/Output Summary (Last 24 hours) at 01/28/2020 1456 Last data filed at 01/28/2020 1300 Gross per 24 hour  Intake 360 ml  Output 1150 ml  Net -790 ml   Filed Weights   01/24/20 1014 01/26/20 0657 01/27/20 0626  Weight: 62.1 kg 73 kg 66.2 kg    Examination:  General - alert Eyes - pupils reactive ENT - no sinus tenderness, no stridor, poor  dentition Cardiac - regular rate/rhythm, no murmur Chest - basilar crackles Abdomen - soft, non tender, + bowel sounds Extremities - no cyanosis, clubbing, or edema Skin - no rashes Neuro - normal strength, moves extremities, follows commands Psych - normal mood and behavior  Resolved Hospital Problem list     Assessment & Plan:   Acute on chronic hypoxic respiratory failure. - has ILD/UIP, COVID PNA in May 2021 and more recently has been on amiodarone since May 2021 for A fib - concern that acute worsening might be related to amiodarone lung toxicity - amiodarone d/c'ed on 7/24 and pt started on solumedrol; since then has improved O2 needs - change to prednisone 40 mg daily  - goal SpO2 90 to 95% - f/u CXR intermittently  Dysphagia with silent aspiration. - D3 diet  Emphysema. - continue breo, incruse  Steroid induced hyperglycemia. - SSI per primary team  Acute on chronic diastolic CHF. Hx of CAD s/p CABG. Hx of a fib. - per primary team and cardiology  Labs    CMP Latest Ref Rng & Units 01/28/2020 01/27/2020 01/26/2020  Glucose 70 - 99 mg/dL 305(H) 206(H) 121(H)  BUN 8 - 23 mg/dL 66(H) 51(H) 38(H)  Creatinine 0.61 - 1.24 mg/dL 2.99(H) 3.06(H) 2.98(H)  Sodium 135 - 145 mmol/L 131(L) 135 138  Potassium 3.5 - 5.1 mmol/L 4.9 5.0 4.2  Chloride 98 - 111 mmol/L 95(L) 97(L) 99  CO2 22 - 32 mmol/L 25 25 27   Calcium 8.9 -  10.3 mg/dL 9.1 9.1 8.7(L)  Total Protein 6.5 - 8.1 g/dL - - -  Total Bilirubin 0.3 - 1.2 mg/dL - - -  Alkaline Phos 38 - 126 U/L - - -  AST 15 - 41 U/L - - -  ALT 0 - 44 U/L - - -    CBC Latest Ref Rng & Units 01/28/2020 01/27/2020 01/26/2020  WBC 4.0 - 10.5 K/uL 13.8(H) 10.6(H) 8.2  Hemoglobin 13.0 - 17.0 g/dL 7.7(L) 8.0(L) 7.5(L)  Hematocrit 39 - 52 % 24.4(L) 26.0(L) 24.1(L)  Platelets 150 - 400 K/uL 253 250 223    CBG (last 3)  Recent Labs    01/28/20 1302  GLUCAP 453*    Signature:  Chesley Mires, MD [place] Pager -  [phone] 01/28/2020, 4:03 PM

## 2020-01-28 NOTE — NC FL2 (Signed)
[place] LEVEL OF CARE SCREENING TOOL     IDENTIFICATION  Patient Name: Robert Little Birthdate: 1941-04-19 Sex: male Admission Date (Current Location): 01/23/2020  [facility] and [place] Number:  Herbalist and Address:  [facility], [place] [address] [place], [place] [address]      Provider Number: [id number]  Attending Physician Name and Address:  Damita Lack, MD  Relative Name and Phone Number:       Current Level of Care: Hospital Recommended Level of Care: [place] Prior Approval Number:    Date Approved/Denied:   PASRR Number: pending  Discharge Plan: SNF    Current Diagnoses: Patient Active Problem List   Diagnosis Date Noted  . Elevated troponin 01/26/2020  . Paroxysmal atrial fibrillation ([place]) 01/26/2020  . Pressure injury of skin 01/25/2020  . Malnutrition of moderate degree 01/25/2020  . Acute renal failure with acute renal cortical necrosis superimposed on stage 4 chronic kidney disease ([place])   . HCAP (healthcare-associated pneumonia) 01/24/2020  . Sepsis due to COVID-19 ([place]) 11/12/2019  . Hypoglycemia 10/28/2019  . Hyponatremia 10/28/2019  . Acute on chronic respiratory failure with hypoxia ([place]) 09/17/2019  . Positive D dimer 09/17/2019  . Acute on chronic diastolic CHF (congestive heart failure) ([place]) 09/17/2019  . Chronic diastolic CHF (congestive heart failure), NYHA class 1 ([place]) 09/17/2019  . Acute hypoxemic respiratory failure ([place]) 09/17/2019  . Syncope 09/05/2019  . Hypocalcemia 09/05/2019  . Hypomagnesemia 09/05/2019  . CKD (chronic kidney disease), stage IV ([place]) 09/05/2019  . Acute urinary retention 05/18/2018  . UTI (urinary tract infection) 05/15/2018  . Hypokalemia 05/15/2018  . AKI (acute kidney injury) ([place]) 05/15/2018  . HLD (hyperlipidemia) 05/15/2018  . Coronary artery disease involving native coronary artery of native heart without angina pectoris   .  Essential hypertension   . Pulmonary fibrosis ([place])   . Schizo affective schizophrenia ([place])   . Normocytic anemia     Orientation RESPIRATION BLADDER Height & Weight     Self, Time, Situation, Place  O2 (see d/c summary for oxygen requirements) Continent Weight: 66.2 kg Height:  5\' 8"  (172.7 cm)  BEHAVIORAL SYMPTOMS/MOOD NEUROLOGICAL BOWEL NUTRITION STATUS        Diet (dysphagia 3 with thin liquids)  AMBULATORY STATUS COMMUNICATION OF NEEDS Skin   Limited Assist Verbally Bruising, Skin abrasions, PU Stage and Appropriate Care (abrasions to bilateral legs/ bruising to bilat arms) PU Stage 1 Dressing:  (foam dressing to buttocks)                     Personal Care Assistance Level of Assistance  Bathing, Feeding, Dressing Bathing Assistance: Limited assistance Feeding assistance: Independent Dressing Assistance: Limited assistance     Functional Limitations Info  Sight, Hearing, Speech Sight Info: Adequate Hearing Info: Impaired Speech Info: Impaired    SPECIAL CARE FACTORS FREQUENCY  PT (By licensed PT), OT (By licensed OT), Speech therapy     PT Frequency: 5x/wk OT Frequency: 5x/wk     Speech Therapy Frequency: 5x/wk      Contractures Contractures Info: Not present    Additional Factors Info  Code Status, Allergies, Psychotropic, Insulin Sliding Scale Code Status Info: Full Allergies Info: NKA Psychotropic Info: Klonopin 0.5 mg BID/ Seroquel 25 mg at bedtime Insulin Sliding Scale Info: Novolog 0-6 units SQ three times a day/ Novolog 0-5 units SQ at bedtime       Current Medications (01/28/2020):  This is the current  hospital active medication list Current Facility-Administered Medications  Medication Dose Route Frequency Provider Last Rate Last Admin  . acetaminophen (TYLENOL) tablet 650 mg  650 mg Oral Q6H PRN Amin, Ankit Chirag, MD      . albuterol (PROVENTIL) (2.5 MG/3ML) 0.083% nebulizer solution 3 mL  3 mL Inhalation Q6H PRN Rise Patience, MD       . aspirin EC tablet 81 mg  81 mg Oral Daily Rise Patience, MD   81 mg at 01/28/20 0939  . atorvastatin (LIPITOR) tablet 20 mg  20 mg Oral QHS Rise Patience, MD   20 mg at 01/27/20 2250  . clonazePAM (KLONOPIN) tablet 0.5 mg  0.5 mg Oral BID Rise Patience, MD   0.5 mg at 01/28/20 0940  . feeding supplement (ENSURE ENLIVE) (ENSURE ENLIVE) liquid 237 mL  237 mL Oral BID BM Amin, Ankit Chirag, MD   237 mL at 01/28/20 1309  . ferrous sulfate tablet 325 mg  325 mg Oral BID WC Rise Patience, MD   325 mg at 01/28/20 0940  . fluticasone furoate-vilanterol (BREO ELLIPTA) 200-25 MCG/INH 1 puff  1 puff Inhalation Daily Rise Patience, MD   1 puff at 01/28/20 0748  . guaiFENesin (MUCINEX) 12 hr tablet 600 mg  600 mg Oral BID Rise Patience, MD   600 mg at 01/28/20 0940  . heparin injection 5,000 Units  5,000 Units Subcutaneous Q8H Rise Patience, MD   5,000 Units at 01/28/20 1308  . insulin aspart (novoLOG) injection 0-5 Units  0-5 Units Subcutaneous QHS Amin, Ankit Chirag, MD      . insulin aspart (novoLOG) injection 0-6 Units  0-6 Units Subcutaneous TID WC Amin, Ankit Chirag, MD   6 Units at 01/28/20 1304  . ipratropium-albuterol (DUONEB) 0.5-2.5 (3) MG/3ML nebulizer solution 3 mL  3 mL Nebulization Q4H PRN Amin, Ankit Chirag, MD      . magnesium oxide (MAG-OX) tablet 400 mg  400 mg Oral Daily Rise Patience, MD   400 mg at 01/28/20 0939  . methylPREDNISolone sodium succinate (SOLU-MEDROL) 40 mg/mL injection 40 mg  40 mg Intravenous Q12H Amin, Ankit Chirag, MD   40 mg at 01/28/20 1309  . multivitamin with minerals tablet 1 tablet  1 tablet Oral Daily Rise Patience, MD   1 tablet at 01/28/20 [phone]  . ondansetron (ZOFRAN) tablet 4 mg  4 mg Oral Q6H PRN Rise Patience, MD       Or  . ondansetron [facility]) injection 4 mg  4 mg Intravenous Q6H PRN Rise Patience, MD      . pantoprazole (PROTONIX) EC tablet 40 mg  40 mg Oral BID AC Amin, Ankit  Chirag, MD      . polyethylene glycol (MIRALAX / GLYCOLAX) packet 17 g  17 g Oral Daily PRN Damita Lack, MD   17 g at 01/26/20 0616  . QUEtiapine (SEROQUEL) tablet 25 mg  25 mg Oral QHS Rise Patience, MD   25 mg at 01/27/20 2252  . senna-docusate (Senokot-S) tablet 2 tablet  2 tablet Oral QHS Amin, Jeanella Flattery, MD   2 tablet at 01/27/20 2250  . tamsulosin (FLOMAX) capsule 0.8 mg  0.8 mg Oral QPC supper Rise Patience, MD   0.8 mg at 01/27/20 1709  . umeclidinium bromide (INCRUSE ELLIPTA) 62.5 MCG/INH 1 puff  1 puff Inhalation Daily Rise Patience, MD   1 puff at 01/28/20 0748  . vitamin B-12 (CYANOCOBALAMIN)  tablet 500 mcg  500 mcg Oral BID Rise Patience, MD   500 mcg at 01/28/20 0122     Discharge Medications: Please see discharge summary for a list of discharge medications.  Relevant Imaging Results:  Relevant Lab Results:   Additional Information SSN: [id number]  Pollie Friar, RN

## 2020-01-28 NOTE — Progress Notes (Signed)
PT Cancellation Note  Patient Details Name: Robert Little MRN: [id number] DOB: [birth year]   Cancelled Treatment:    Reason Eval/Treat Not Completed: Other (comment) patient eating lunch which had arrived late, requests that PT return later. Will attempt to return if time/schedule allow.     Windell Norfolk, DPT, PN1   Supplemental Physical Therapist [facility]    Pager [phone] Acute Rehab Office [phone]

## 2020-01-28 NOTE — Progress Notes (Signed)
PROGRESS NOTE    Robert Little  YNW:[id number] DOB: September 06, 1940 DOA: 01/23/2020 PCP: [name]   Brief Narrative:  79 year old with history of pulmonary fibrosis, paroxysmal A. fib, diastolic CHF, CKD stage IV, anemia of chronic disease, COVID-19 pneumonia 2 months ago admitted for shortness of breath form SNF.  Prior to admission was treated with 5 days of empiric antibiotic for presumed pneumonia.  Upon admission he was quite hypoxic with concerns for CHF exacerbation versus pneumonia.  Initially had mild volume overload but eventually working diagnosis was UIP flare versus amiodarone toxicity.  Further work-up including CT confirmed this and patient was started on steroids   Assessment & Plan:   Principal Problem:   Acute on chronic respiratory failure with hypoxia (HCC) Active Problems:   Essential hypertension   Pulmonary fibrosis (HCC)   Schizo affective schizophrenia ([place])   CKD (chronic kidney disease), stage IV (HCC)   Acute on chronic diastolic CHF (congestive heart failure) (HCC)   HCAP (healthcare-associated pneumonia)   Pressure injury of skin   Acute renal failure with acute renal cortical necrosis superimposed on stage 4 chronic kidney disease (HCC)   Malnutrition of moderate degree   Elevated troponin   Paroxysmal atrial fibrillation (HCC)  Acute hypoxic respiratory failure, slow improvement still on 7 L high flow Chronic congestive heart failure with preserved ejection fraction, 65% Presumed bilateral lower lobe pneumonia at admission Interstitial pulmonary fibrosis, amiodarone toxicity -Reduce IV Solu-Medrol to 40 mg twice daily l, aggressive bronchodilators -CT chest shows extensive fibrocystic disease, [place]. -Pulmonary following.  Eventually may need bronchoscopy -Echocardiogram 11/2019-EF 60 to 65%, grade 2 diastolic dysfunction -Cardiology following -Pro-Cal negative-antibiotics stopped -Wean off oxygen as deemed appropriate. -Respiratory  viral panel-negative -Lower extremity Dopplers-negative.  VQ scan-some mismatch defect secondary to fibrosis but no evidence of filling defect concerning for PE. -Chest x-ray 7/23-shows extensive airspace opacity chronic interstitial lung disease  Mild leukocytosis -Secondary to steroids.  No obvious evidence of infection but will continue to monitor  Elevated troponin, suspect demand ischemia -Demand ischemia but should get further evaluation once medically stable. Echocardiogram shows EF 60 to 65%, grade 2 DD  Atrial fibrillation, paroxysmal -Amiodarone stopped -No anticoagulation due to risk of falls  Renal insufficiency chronic kidney disease stage IV -Around baseline creatinine of 2.4.  Continue to monitor. Creatinine 2.99  Anemia of chronic disease, hemoglobin 7.7 -Hemoglobin appears to be stable without any evidence of bleeding -Continue aspirin, iron supplements. -No obvious evidence of bleeding.  Monitor stress and on steroids, I will increase his PPI to twice daily  GERD -PPI twice daily AC  History of COPD History of pulmonary fibrosis with chronic hypoxia on 2 L nasal cannula -Aggressive bronchodilators, incentive spirometer and flutter valve.  History of schizoaffective disorder -Seroquel  BPH -Flomax  Caregiver understands his critical illness.  Currently wishes to be full code but open to further discussion in the future  DVT prophylaxis: heparin injection 5,000 Units Start: 01/24/20 0600 Code Status: Full Family Communication:  Spoke with [name] (Care giver, legal guardian) again today.     Status is: Inpatient  Remains inpatient appropriate because:IV treatments appropriate due to intensity of illness or inability to take PO  Dispo: The patient is from: Home              Anticipated d/c is to: Home              Anticipated d/c date is: Greater than 3 days  Patient currently is not medically stable to d/c.  Still requiring  significant amount of oxygen.  Body mass index is 22.19 kg/m.  Pressure Injury 11/27/19 Ear Left Stage 2 -  Partial thickness loss of dermis presenting as a shallow open injury with a red, pink wound bed without slough. Red/pink (Active)  11/27/19 1350  Location: Ear  Location Orientation: Left  Staging: Stage 2 -  Partial thickness loss of dermis presenting as a shallow open injury with a red, pink wound bed without slough.  Wound Description (Comments): Red/pink  Present on Admission: No     Pressure Injury 01/24/20 Buttocks Mid Stage 1 -  Intact skin with non-blanchable redness of a localized area usually over a bony prominence. (Active)  01/24/20 2300  Location: Buttocks  Location Orientation: Mid  Staging: Stage 1 -  Intact skin with non-blanchable redness of a localized area usually over a bony prominence.  Wound Description (Comments):   Present on Admission: Yes   Subjective: Still requiring 7 L high flow nasal cannula and dyspneic with minimal movement.  Nonproductive coughing  Review of Systems General: Denies fever, chills, night sweats or unintended weight loss. Resp: Denies hemoptysis Cardiac: Denies chest pain, palpitations, orthopnea, paroxysmal nocturnal dyspnea. GI: Denies abdominal pain, nausea, vomiting, diarrhea or constipation GU: Denies dysuria, frequency, hesitancy or incontinence MS: Denies muscle aches, joint pain or swelling Neuro: Denies headache, neurologic deficits (focal weakness, numbness, tingling), abnormal gait Psych: Denies anxiety, depression, SI/HI/AVH Skin: Denies new rashes or lesions ID: Denies sick contacts, exotic exposures, travel  Examination: Constitutional: Not in acute distress, 7 L nasal cannula Respiratory: Bilateral rhonchi at the bases, overall improved Cardiovascular: Normal sinus rhythm, no rubs Abdomen: Nontender nondistended good bowel sounds Musculoskeletal: No edema noted Skin: No rashes seen Neurologic: CN 2-12  grossly intact.  And nonfocal Psychiatric: Normal judgment and insight. Alert and oriented x 3. Normal mood.  Objective: Vitals:   01/27/20 1528 01/27/20 1933 01/27/20 2011 01/27/20 2320  BP: (!) 110/49 (!) 115/50  (!) 124/51  Pulse: 74 76  64  Resp:  18  20  Temp: 97.7 F (36.5 C) 97.9 F (36.6 C)  98.1 F (36.7 C)  TempSrc:      SpO2: 100% 98% 100% 100%  Weight:      Height:        Intake/Output Summary (Last 24 hours) at 01/28/2020 0732 Last data filed at 01/28/2020 4782 Gross per 24 hour  Intake --  Output 1350 ml  Net -1350 ml   Filed Weights   01/24/20 1014 01/26/20 0657 01/27/20 0626  Weight: 62.1 kg 73 kg 66.2 kg   Data Reviewed:   CBC: Recent Labs  Lab 01/23/20 2343 01/23/20 2343 01/24/20 0619 01/25/20 0728 01/26/20 0357 01/27/20 0338 01/28/20 0157  WBC 9.0   < > 8.3 8.5 8.2 10.6* 13.8*  NEUTROABS 7.3  --  5.1  --   --   --   --   HGB 8.2*   < > 7.3* 8.0* 7.5* 8.0* 7.7*  HCT 27.8*   < > 25.2* 26.6* 24.1* 26.0* 24.4*  MCV 93.6   < > 95.1 90.2 89.3 87.8 88.1  PLT 198   < > 177 211 223 250 253   < > = values in this interval not displayed.   Basic Metabolic Panel: Recent Labs  Lab 01/24/20 0619 01/25/20 0728 01/26/20 0357 01/27/20 0338 01/28/20 0157  NA 140 138 138 135 131*  K 4.8 4.4 4.2 5.0 4.9  CL  105 99 99 97* 95*  CO2 23 28 27 25 25   GLUCOSE 121* 135* 121* 206* 305*  BUN 25* 32* 38* 51* 66*  CREATININE 2.47* 2.99* 2.98* 3.06* 2.99*  CALCIUM 8.4* 8.8* 8.7* 9.1 9.1  MG  --  1.8 1.9 2.1 1.9   GFR: Estimated Creatinine Clearance: 19.4 mL/min (A) (by C-G formula based on SCr of 2.99 mg/dL (H)). Liver Function Tests: Recent Labs  Lab 01/23/20 2343 01/24/20 0619  AST 14* 15  ALT 11 9  ALKPHOS 86 67  BILITOT 0.7 1.0  PROT 7.7 6.0*  ALBUMIN 3.0* 2.5*   No results for input(s): LIPASE, AMYLASE in the last 168 hours. No results for input(s): AMMONIA in the last 168 hours. Coagulation Profile: Recent Labs  Lab 01/24/20 0049  INR 1.1    Cardiac Enzymes: Recent Labs  Lab 01/24/20 0619  CKTOTAL 129   BNP (last 3 results) No results for input(s): PROBNP in the last 8760 hours. HbA1C: No results for input(s): HGBA1C in the last 72 hours. CBG: No results for input(s): GLUCAP in the last 168 hours. Lipid Profile: No results for input(s): CHOL, HDL, LDLCALC, TRIG, CHOLHDL, LDLDIRECT in the last 72 hours. Thyroid Function Tests: No results for input(s): TSH, T4TOTAL, FREET4, T3FREE, THYROIDAB in the last 72 hours. Anemia Panel: No results for input(s): VITAMINB12, FOLATE, FERRITIN, TIBC, IRON, RETICCTPCT in the last 72 hours. Sepsis Labs: Recent Labs  Lab 01/23/20 2343 01/24/20 0619 01/24/20 0833  PROCALCITON  --   --  <0.10  LATICACIDVEN 2.1* 1.8 2.0*    Recent Results (from the past 240 hour(s))  Culture, blood (routine x 2)     Status: None (Preliminary result)   Collection Time: 01/23/20 11:45 PM   Specimen: BLOOD  Result Value Ref Range Status   Specimen Description BLOOD RIGHT ANTECUBITAL  Final   Special Requests   Final    BOTTLES DRAWN AEROBIC AND ANAEROBIC Blood Culture adequate volume   Culture   Final    NO GROWTH 3 DAYS Performed at [facility], [address], [place], [place] [address]    Report Status PENDING  Incomplete  Culture, blood (routine x 2)     Status: None (Preliminary result)   Collection Time: 01/23/20 11:47 PM   Specimen: BLOOD  Result Value Ref Range Status   Specimen Description BLOOD LEFT ANTECUBITAL  Final   Special Requests   Final    BOTTLES DRAWN AEROBIC AND ANAEROBIC Blood Culture adequate volume   Culture   Final    NO GROWTH 3 DAYS Performed at [facility], [place] [address] [place], [place] [address]    Report Status PENDING  Incomplete  Urine culture     Status: None   Collection Time: 01/24/20  2:58 AM   Specimen: Urine, Random  Result Value Ref Range Status   Specimen Description URINE, RANDOM  Final   Special Requests NONE  Final    Culture   Final    NO GROWTH Performed at [facility], [place] [address] [place], [place] [address]    Report Status 01/24/2020 FINAL  Final  Respiratory Panel by PCR     Status: None   Collection Time: 01/25/20 12:36 PM   Specimen: Nasopharyngeal Swab; Respiratory  Result Value Ref Range Status   Adenovirus NOT DETECTED NOT DETECTED Final   Coronavirus 229E NOT DETECTED NOT DETECTED Final    Comment: (NOTE) The Coronavirus on the Respiratory Panel, DOES NOT test  for the novel  Coronavirus (2019 nCoV)    Coronavirus HKU1 NOT DETECTED NOT DETECTED Final   Coronavirus NL63 NOT DETECTED NOT DETECTED Final   Coronavirus OC43 NOT DETECTED NOT DETECTED Final   Metapneumovirus NOT DETECTED NOT DETECTED Final   Rhinovirus / Enterovirus NOT DETECTED NOT DETECTED Final   Influenza A NOT DETECTED NOT DETECTED Final   Influenza B NOT DETECTED NOT DETECTED Final   Parainfluenza Virus 1 NOT DETECTED NOT DETECTED Final   Parainfluenza Virus 2 NOT DETECTED NOT DETECTED Final   Parainfluenza Virus 3 NOT DETECTED NOT DETECTED Final   Parainfluenza Virus 4 NOT DETECTED NOT DETECTED Final   Respiratory Syncytial Virus NOT DETECTED NOT DETECTED Final   Bordetella pertussis NOT DETECTED NOT DETECTED Final   Chlamydophila pneumoniae NOT DETECTED NOT DETECTED Final   Mycoplasma pneumoniae NOT DETECTED NOT DETECTED Final    Comment: Performed at [facility], [place] [address] [place], [place] [address]     Radiology Studies: CT CHEST WO CONTRAST  Result Date: 01/26/2020 CLINICAL DATA:  79 year old male with history of extreme shortness of breath and cough. Evaluate for interstitial lung disease. EXAM: CT CHEST WITHOUT CONTRAST TECHNIQUE: Multidetector CT imaging of the chest was performed following the standard protocol without IV contrast. COMPARISON:  Chest CT 09/19/2019. FINDINGS: Cardiovascular: Heart size is normal. There is no significant pericardial fluid, thickening or pericardial  calcification. There is aortic atherosclerosis, as well as atherosclerosis of the great vessels of the mediastinum and the coronary arteries, including calcified atherosclerotic plaque in the left main, left anterior descending, left circumflex and right coronary arteries. Calcifications of the aortic valve. Mediastinum/Nodes: No pathologically enlarged mediastinal or hilar lymph nodes. Multiple prominent borderline enlarged mediastinal and hilar lymph nodes are noted, similar to the prior study, presumably chronic and reactive. Esophagus is unremarkable in appearance. No axillary lymphadenopathy. Lungs/Pleura: High-resolution imaging was not performed. With these limitations in mind there is again widespread ground-glass attenuation, septal thickening, thickening of the peribronchovascular interstitium, traction bronchiectasis and some areas of honeycombing in the lungs bilaterally. These findings have a craniocaudal gradient and appear minimally progressive compared to the prior study. No new acute consolidative airspace disease. Trace amount of left pleural fluid and/or thickening, similar to the prior study. Upper Abdomen: Aortic atherosclerosis. Musculoskeletal: Median sternotomy wires. There are no aggressive appearing lytic or blastic lesions noted in the visualized portions of the skeleton. IMPRESSION: 1. The appearance of the lungs is compatible with interstitial lung disease, with a spectrum of findings considered diagnostic of usual interstitial pneumonia (UIP) per current ATS guidelines. Minimal progression compared to the prior study. 2. Aortic atherosclerosis, in addition to left main and 3 vessel coronary artery disease. Assessment for potential risk factor modification, dietary therapy or pharmacologic therapy may be warranted, if clinically indicated. 3. There are calcifications of the aortic valve. Echocardiographic correlation for evaluation of potential valvular dysfunction may be warranted if  clinically indicated. 4. Additional incidental findings, as above. Aortic Atherosclerosis (ICD10-I70.0). Electronically Signed   By: Vinnie Langton M.D.   On: 01/26/2020 11:17        Scheduled Meds: . aspirin EC  81 mg Oral Daily  . atorvastatin  20 mg Oral QHS  . clonazePAM  0.5 mg Oral BID  . feeding supplement (ENSURE ENLIVE)  237 mL Oral BID BM  . ferrous sulfate  325 mg Oral BID WC  . fluticasone furoate-vilanterol  1 puff Inhalation Daily  . guaiFENesin  600 mg Oral BID  .  heparin  5,000 Units Subcutaneous Q8H  . magnesium oxide  400 mg Oral Daily  . methylPREDNISolone (SOLU-MEDROL) injection  60 mg Intravenous Q12H  . multivitamin with minerals  1 tablet Oral Daily  . pantoprazole  40 mg Oral Daily  . QUEtiapine  25 mg Oral QHS  . senna-docusate  2 tablet Oral QHS  . tamsulosin  0.8 mg Oral QPC supper  . umeclidinium bromide  1 puff Inhalation Daily  . vitamin B-12  500 mcg Oral BID   Continuous Infusions:    LOS: 4 days   Time spent= 35 mins  Haiven Nardone Arsenio Loader, MD [facility]  If 7PM-7AM, please contact night-coverage  01/28/2020, 7:32 AM

## 2020-01-28 NOTE — Progress Notes (Signed)
Progress Note  Patient Name: Robert Little Date of Encounter: 01/28/2020  [facility] Cardiologist: Sanda Klein, MD   Subjective   Has some chest pains "cause I ate too fast". Tachypneic, but believes his breathing is better. O2 sat 90% on 5L O2. Creatinine stabilized at 3.0 - this is probably his baseline, I think. In/ out recording is useless after an input of 2.5 million ml IV was recorded on 7/22 night shift. Assuming that was actually a 250 ml piggyback, the patient is net negative 5 liters since admission and holding steady last 24 h. No weight yet today.  Inpatient Medications    Scheduled Meds: . aspirin EC  81 mg Oral Daily  . atorvastatin  20 mg Oral QHS  . clonazePAM  0.5 mg Oral BID  . feeding supplement (ENSURE ENLIVE)  237 mL Oral BID BM  . ferrous sulfate  325 mg Oral BID WC  . fluticasone furoate-vilanterol  1 puff Inhalation Daily  . guaiFENesin  600 mg Oral BID  . heparin  5,000 Units Subcutaneous Q8H  . insulin aspart  0-5 Units Subcutaneous QHS  . insulin aspart  0-6 Units Subcutaneous TID WC  . magnesium oxide  400 mg Oral Daily  . methylPREDNISolone (SOLU-MEDROL) injection  60 mg Intravenous Q12H  . multivitamin with minerals  1 tablet Oral Daily  . pantoprazole  40 mg Oral BID AC  . QUEtiapine  25 mg Oral QHS  . senna-docusate  2 tablet Oral QHS  . tamsulosin  0.8 mg Oral QPC supper  . umeclidinium bromide  1 puff Inhalation Daily  . vitamin B-12  500 mcg Oral BID   Continuous Infusions:  PRN Meds: acetaminophen, albuterol, ipratropium-albuterol, ondansetron **OR** ondansetron (ZOFRAN) IV, polyethylene glycol   Vital Signs    Vitals:   01/27/20 2320 01/28/20 0748 01/28/20 0821 01/28/20 0944  BP: (!) 124/51  (!) 126/42   Pulse: 64  57   Resp: 20  17   Temp: 98.1 F (36.7 C)  (!) 97.5 F (36.4 C)   TempSrc:      SpO2: 100% 93% 100% 97%  Weight:      Height:        Intake/Output Summary (Last 24 hours) at 01/28/2020 1021 Last  data filed at 01/28/2020 0900 Gross per 24 hour  Intake --  Output 1300 ml  Net -1300 ml   Last 3 Weights 01/27/2020 01/26/2020 01/24/2020  Weight (lbs) 145 lb 15.1 oz 160 lb 15 oz 137 lb  Weight (kg) 66.2 kg 73 kg 62.143 kg      Telemetry    NSR - Personally Reviewed  ECG    No new tracing - Personally Reviewed  Physical Exam  Tachypneic, but able to hold a conversation and does not appear uncomfortable GEN: No acute distress.   Neck: No JVD Cardiac: RRR, no murmurs, rubs, or gallops.  Respiratory: dry crackles in both bases. GI: Soft, nontender, non-distended  MS: No edema; No deformity. Neuro:  Nonfocal  Psych: Normal affect   Labs    High Sensitivity Troponin:   Recent Labs  Lab 01/24/20 0619 01/24/20 0833 01/24/20 1553 01/24/20 1752 01/24/20 2245  TROPONINIHS 1,831* 1,903* 785* 844* 941*      Chemistry Recent Labs  Lab 01/23/20 2343 01/23/20 2343 01/24/20 0619 01/25/20 0728 01/26/20 0357 01/27/20 0338 01/28/20 0157  NA 139   < > 140   < > 138 135 131*  K 4.5   < > 4.8   < >  4.2 5.0 4.9  CL 103   < > 105   < > 99 97* 95*  CO2 24   < > 23   < > 27 25 25   GLUCOSE 219*   < > 121*   < > 121* 206* 305*  BUN 26*   < > 25*   < > 38* 51* 66*  CREATININE 2.51*   < > 2.47*   < > 2.98* 3.06* 2.99*  CALCIUM 9.0   < > 8.4*   < > 8.7* 9.1 9.1  PROT 7.7  --  6.0*  --   --   --   --   ALBUMIN 3.0*  --  2.5*  --   --   --   --   AST 14*  --  15  --   --   --   --   ALT 11  --  9  --   --   --   --   ALKPHOS 86  --  67  --   --   --   --   BILITOT 0.7  --  1.0  --   --   --   --   GFRNONAA 24*   < > 24*   < > 19* 19* 19*  GFRAA 28*   < > 28*   < > 22* 22* 22*  ANIONGAP 12   < > 12   < > 12 13 11    < > = values in this interval not displayed.     Hematology Recent Labs  Lab 01/26/20 0357 01/27/20 0338 01/28/20 0157  WBC 8.2 10.6* 13.8*  RBC 2.70* 2.96* 2.77*  HGB 7.5* 8.0* 7.7*  HCT 24.1* 26.0* 24.4*  MCV 89.3 87.8 88.1  MCH 27.8 27.0 27.8  MCHC 31.1  30.8 31.6  RDW 17.5* 16.8* 16.8*  PLT 223 250 253    BNP Recent Labs  Lab 01/23/20 2343 01/25/20 0733  BNP 836.2* 549.0*     DDimer  Recent Labs  Lab 01/25/20 0746  DDIMER 2.12*     Radiology    No results found.  Cardiac Studies   2D echo 01/24/20  1. Left ventricular ejection fraction, by estimation, is 60 to 65%. The  left ventricle has normal function. The left ventricle has no regional  wall motion abnormalities. There is mild left ventricular hypertrophy.  Left ventricular diastolic parameters  are consistent with Grade II diastolic dysfunction (pseudonormalization).  Elevated left atrial pressure.  2. Right ventricular systolic function is normal. The right ventricular  size is mildly enlarged. Tricuspid regurgitation signal is inadequate for  assessing PA pressure.  3. The mitral valve is normal in structure. Mild mitral valve  regurgitation.  4. The aortic valve is tricuspid. Aortic valve regurgitation is not  visualized. Mild to moderate aortic valve sclerosis/calcification is  present, without any evidence of aortic stenosis.   Patient Profile     79 y.o. male with a hx of CAD s/p prior CABG([facility] 02/2004),DM2,HTN, HLD, pulmonary fibrosiswith chronic respiratory failure onhome O2,Covid-19 PNA 11/2019,schizoaffective disorderin a group home, CKD IV, anemia of chronic disease,GERD, chronic diastolic CHF, lung nodule, extensive tobacco history, dysphagia, chronic appearing anemiawho is being followed for the evaluation of elevated troponin/heart failureat the request of Dr. Reesa Chew.  Assessment & Plan    1. Acute on chronic hypoxic respiratory failure: -Pt hasmultifactorial hypoxia: mostly due to underlying pulmonary fibrosis (precedes COVID-19 infection and use of amiodarone), acute on chronic diastolic heart failure,  andh/o Covid-19 PNA in 11/2019 -Elevated D-dimer, VQ scan low probability -suspect he is now euvolemic and current hypoxia is  related to lung fibrosis -was previously treated for pneumonia, procalcitonin negative, antibiotics stopped -appreciate pulmonology input, started treatment for possible amiodarone induced lung toxicity with steroids 01/26/20. Lung exam improved today.  2. Acute on chronic diastolic heart failure -LVEF normal, echo did suggest increased filling pressures on admission, now net negative 5 liters. Probably euvolemic. -BNP 549 on admission, baseline around 260 (during past illness was >2000). -weights have been inaccurate and none recorded today, but previously estimated  To have a "dry weight" of 151 lb (68 kg). Yesterday's weight was lower than that.  3. Elevated troponin in the context of known CAD s/p CABG 2005 -hsTroponins [phone] -Normal LVEF and wall motion on echo. Mild troponin spill is not unexpected with acute illness or severe hypoxemia. No plan for invasive evaluation unless clear acute STEMI/unstable NSTEMI, due to advanced kidney disease. -Continue ASA, statin (LDL 41 in 09/2019)  4. CKD stage IV - Creatinine 2.99, likely his baseline  - hold diuretics  5. Acute on chronic anemia - Baseline Hgb range 6-9 - Hgb 7.7 today, stable throughout this admission. There is a component of Fe deficiency, likely mostly Epo deficiency.  6. Paroxysmal atrial fibrillation - during acute COVID 19 pneumonia, currently in NSR. - amiodarone stopped. - not a candidate for anticoagulation  7. Abnormal TSH  -Amiodarone discontinued  -normal T3 and T4      For questions or updates, please contact [place] Please consult [email or web address] for contact info under        Signed, Sanda Klein, MD  01/28/2020, 10:21 AM

## 2020-01-28 NOTE — TOC Initial Note (Signed)
Transition of Care [facility]) - Initial/Assessment Note    Patient Details  Name: Robert Little MRN: [id number] Date of Birth: May 10, 1941  Transition of Care [facility]) CM/SW Contact:    Pollie Friar, RN Phone Number: 01/28/2020, 1:23 PM  Clinical Narrative:                 Pt admitted from [facility]. Robert Little states he was there for rehab. CM has asked for PT/OT evals. CM met with the patient and he is agreeable to returning to [facility].  FL2 completed and faxed to [facility]. PASAR expired in [place] must. Attempted to re submit and states pending from another facility. Miquel Dunn is looking into this.  TOC following.  Expected Discharge Plan: Skilled Nursing Facility Barriers to Discharge: Continued Medical Work up   Patient Goals and CMS Choice   CMS Medicare.gov Compare Post Acute Care list provided to:: Patient Choice offered to / list presented to : Patient  Expected Discharge Plan and Services Expected Discharge Plan: [place] In-house Referral: Clinical Social Work Discharge Planning Services: CM Consult Post Acute Care Choice: [place] Living arrangements for the past 2 months:                                       Prior Living Arrangements/Services Living arrangements for the past 2 months: [place] Lives with:: Facility Resident (in [place] place for rehab) Patient language and need for interpreter reviewed:: Yes Do you feel safe going back to the place where you live?: Yes      Need for Family Participation in Patient Care: Yes (Comment) Care giver support system in place?: No (comment)   Criminal Activity/Legal Involvement Pertinent to Current Situation/Hospitalization: No - Comment as needed  Activities of Daily Living      Permission Sought/Granted                  Emotional Assessment Appearance:: Appears stated age Attitude/Demeanor/Rapport: Engaged Affect  (typically observed): Accepting Orientation: : Oriented to Self, Oriented to Place, Oriented to Situation   Psych Involvement: No (comment)  Admission diagnosis:  Acute respiratory failure with hypoxia ([place]) [J96.01] HCAP (healthcare-associated pneumonia) [J18.9] Patient Active Problem List   Diagnosis Date Noted  . Elevated troponin 01/26/2020  . Paroxysmal atrial fibrillation ([place]) 01/26/2020  . Pressure injury of skin 01/25/2020  . Malnutrition of moderate degree 01/25/2020  . Acute renal failure with acute renal cortical necrosis superimposed on stage 4 chronic kidney disease ([place])   . HCAP (healthcare-associated pneumonia) 01/24/2020  . Sepsis due to COVID-19 ([place]) 11/12/2019  . Hypoglycemia 10/28/2019  . Hyponatremia 10/28/2019  . Acute on chronic respiratory failure with hypoxia ([place]) 09/17/2019  . Positive D dimer 09/17/2019  . Acute on chronic diastolic CHF (congestive heart failure) ([place]) 09/17/2019  . Chronic diastolic CHF (congestive heart failure), NYHA class 1 ([place]) 09/17/2019  . Acute hypoxemic respiratory failure ([place]) 09/17/2019  . Syncope 09/05/2019  . Hypocalcemia 09/05/2019  . Hypomagnesemia 09/05/2019  . CKD (chronic kidney disease), stage IV ([place]) 09/05/2019  . Acute urinary retention 05/18/2018  . UTI (urinary tract infection) 05/15/2018  . Hypokalemia 05/15/2018  . AKI (acute kidney injury) ([place]) 05/15/2018  . HLD (hyperlipidemia) 05/15/2018  . Coronary artery disease involving native coronary artery of native heart without angina pectoris   . Essential hypertension   . Pulmonary fibrosis ([place])   .  Schizo affective schizophrenia ([place])   . Normocytic anemia    PCP:  Care, [name] Total Access Pharmacy:  No Pharmacies Listed    Social Determinants of Health (SDOH) Interventions    Readmission Risk Interventions Readmission Risk Prevention Plan 11/23/2019 09/20/2019  Transportation Screening Complete Complete  PCP or Specialist Appt within 3-5  Days - Complete  HRI or [place] - Complete  Social Work Consult for [place] Planning/Counseling - Complete  Palliative Care Screening - Not Applicable  Medication Review Press photographer) Complete Complete  PCP or Specialist appointment within 3-5 days of discharge Complete -  [place] or Home Care Consult Complete -  SW Recovery Care/Counseling Consult Complete -  Palliative Care Screening Not Applicable -  [place] Complete -

## 2020-01-28 NOTE — Plan of Care (Signed)

## 2020-01-29 LAB — BASIC METABOLIC PANEL
Anion gap: 10 (ref 5–15)
BUN: 76 mg/dL — ABNORMAL HIGH (ref 8–23)
CO2: 26 mmol/L (ref 22–32)
Calcium: 9.2 mg/dL (ref 8.9–10.3)
Chloride: 98 mmol/L (ref 98–111)
Creatinine, Ser: 2.65 mg/dL — ABNORMAL HIGH (ref 0.61–1.24)
GFR calc Af Amer: 26 mL/min — ABNORMAL LOW (ref >60)
GFR calc non Af Amer: 22 mL/min — ABNORMAL LOW (ref >60)
Glucose, Bld: 270 mg/dL — ABNORMAL HIGH (ref 70–99)
Potassium: 4.8 mmol/L (ref 3.5–5.1)
Sodium: 134 mmol/L — ABNORMAL LOW (ref 135–145)

## 2020-01-29 LAB — FERRITIN: Ferritin: 1101 ng/mL — ABNORMAL HIGH (ref 24–336)

## 2020-01-29 LAB — IRON AND TIBC
Iron: 62 ug/dL (ref 45–182)
Saturation Ratios: 27 % (ref 17.9–39.5)
TIBC: 227 ug/dL — ABNORMAL LOW (ref 250–450)
UIBC: 165 ug/dL

## 2020-01-29 LAB — CBC
HCT: 25.8 % — ABNORMAL LOW (ref 39.0–52.0)
Hemoglobin: 8 g/dL — ABNORMAL LOW (ref 13.0–17.0)
MCH: 26.9 pg (ref 26.0–34.0)
MCHC: 31 g/dL (ref 30.0–36.0)
MCV: 86.9 fL (ref 80.0–100.0)
Platelets: 301 10*3/uL (ref 150–400)
RBC: 2.97 MIL/uL — ABNORMAL LOW (ref 4.22–5.81)
RDW: 16.8 % — ABNORMAL HIGH (ref 11.5–15.5)
WBC: 15 10*3/uL — ABNORMAL HIGH (ref 4.0–10.5)
nRBC: 0 % (ref 0.0–0.2)

## 2020-01-29 LAB — CULTURE, BLOOD (ROUTINE X 2)
Culture: NO GROWTH
Culture: NO GROWTH
Special Requests: ADEQUATE
Special Requests: ADEQUATE

## 2020-01-29 LAB — VITAMIN B12: Vitamin B-12: 1337 pg/mL — ABNORMAL HIGH (ref 180–914)

## 2020-01-29 LAB — GLUCOSE, CAPILLARY
Glucose-Capillary: 219 mg/dL — ABNORMAL HIGH (ref 70–99)
Glucose-Capillary: 352 mg/dL — ABNORMAL HIGH (ref 70–99)
Glucose-Capillary: 286 mg/dL — ABNORMAL HIGH (ref 70–99)
Glucose-Capillary: 319 mg/dL — ABNORMAL HIGH (ref 70–99)

## 2020-01-29 LAB — MAGNESIUM: Magnesium: 1.9 mg/dL (ref 1.7–2.4)

## 2020-01-29 MED ORDER — INSULIN GLARGINE 100 UNIT/ML ~~LOC~~ SOLN
10.0000 [IU] | Freq: Every day | SUBCUTANEOUS | Status: DC
  Administered 2020-01-29 – 2020-01-30 (×2): 10 [IU] via SUBCUTANEOUS
  Filled 2020-01-29 (×3): qty 0.1

## 2020-01-29 NOTE — Evaluation (Signed)
Occupational Therapy Evaluation Patient Details Name: Robert Little MRN: [id number] DOB: 15-Feb-1941 Today's Date: 01/29/2020    History of Present Illness 79 y.o. male with medical history significant for chronic anemia, pulmonary fibrosis, hypertension, coronary artery disease, chronic diastolic CHF, and schizoaffective disorder, presenting to the emergency department with progressive shortness of breath long with severe orthopnea starting 1 to 2 days prior to hospital visit, in the hospital work-up suggestive of CHF and acute hypoxic respiratory failure and he was admitted 09/16/19   Clinical Impression   Pt re-admitted to hospital from SNF, where he was receiving short term rehab services. Pt presents with diagnoses above and deficits in cardiopulmonary tolerance, balance, and safety awareness. Pt requires up to Mod A for LB ADLs due to deficits. Pt requires Min A + 2 for transfers and short distance mobility with RW and frequent cues for energy conservation strategies. Pt received on 3 L O2, but desats to high 70s sitting EOB during tasks with frequent conversing with therapists. Increased pt to 6 L O2 for mobility with continued desats to high 70s recovering within one minute of seated rest break and pursed lip breathing. Recommend DC back to SNF for continued rehab.      Follow Up Recommendations  SNF    Equipment Recommendations  Other (comment) (TBD)    Recommendations for Other Services       Precautions / Restrictions Precautions Precautions: Fall Precaution Comments: monitor O2 Restrictions Weight Bearing Restrictions: No      Mobility Bed Mobility Overal bed mobility: Needs Assistance Bed Mobility: Supine to Sit     Supine to sit: Min guard     General bed mobility comments: min guard for safety  Transfers Overall transfer level: Needs assistance Equipment used: Rolling walker (2 wheeled) Transfers: Sit to/from [facility] Sit to Stand: Min  assist Stand pivot transfers: Min assist       General transfer comment: Min A for power up and steadying in standing at bedside, cues for safe hand placement. Pt Min A for pivot to recliner to maintain balance and cues for RW manuevering    Balance Overall balance assessment: Needs assistance Sitting-balance support: Feet supported Sitting balance-Leahy Scale: Fair Sitting balance - Comments: can maintain static sitting balance but LOB to L side when attempting to don socks EOB   Standing balance support: Bilateral upper extremity supported;During functional activity Standing balance-Leahy Scale: Poor Standing balance comment: Reliant on B UE support during mobility                           ADL either performed or assessed with clinical judgement   ADL Overall ADL's : Needs assistance/impaired Eating/Feeding: Independent;Sitting   Grooming: Set up;Sitting   Upper Body Bathing: Supervision/ safety;Sitting   Lower Body Bathing: Moderate assistance;Sit to/from stand   Upper Body Dressing : Supervision/safety;Sitting   Lower Body Dressing: Minimal assistance;Sit to/from stand Lower Body Dressing Details (indicate cue type and reason): MIn A to min guard to maintain sitting balance for donning socks as pt with tendency to lean over, but able to successfully don new socks Toilet Transfer: Minimal assistance;Stand-pivot;BSC;RW Toilet Transfer Details (indicate cue type and reason): simulated to [place] and Hygiene: Sit to/from stand;Moderate assistance       Functional mobility during ADLs: Minimal assistance;Rolling walker;Cueing for safety;Cueing for sequencing;+2 for safety/equipment General ADL Comments: Pt with decreased cardiopulmonary tolerance, balance and decreased safety awareness impacting ability to safely complete  ADLs/mobility without assistance     Vision Baseline Vision/History: No visual deficits (pt able to locate  and read O2 vitals on monitor) Patient Visual Report: No change from baseline Vision Assessment?: No apparent visual deficits     Perception     Praxis      Pertinent Vitals/Pain Pain Assessment: No/denies pain     Hand Dominance Right   Extremity/Trunk Assessment Upper Extremity Assessment Upper Extremity Assessment: Overall WFL for tasks assessed   Lower Extremity Assessment Lower Extremity Assessment: Defer to PT evaluation   Cervical / Trunk Assessment Cervical / Trunk Assessment: Kyphotic   Communication Communication Communication: HOH;Expressive difficulties (mostly incoherent speech)   Cognition Arousal/Alertness: Awake/alert Behavior During Therapy: Restless Overall Cognitive Status: History of cognitive impairments - at baseline                                 General Comments: Pt answered all orientation questions appropriately but noted to be tangential with incoherent speech (often talking about money in different states). Pt requires frequent cues to perform pursed lip breathing rather than continuing to talk   General Comments  Pt received on 3 L O2 with desats to 79% sitting EOB conversing with therapists. Increased to 6 L O2 for activity with desats to 79% with short distance mobility just passed sink and back to recliner chair. pt requires frequent reminders for pursed lip breathing but able to recover within one minute. Slowly lowered pt down to 3 L O2 sitting in chair with SpO2 >96%. Pt demonstrated ability to use flutter valve/IS (up to 1220mL) with min verbal cues. Pt also noted to be completing IS exercises on therapist exit    Exercises     Shoulder Instructions      Home Living Family/patient expects to be discharged to:: Group home                                 Additional Comments: Poor historian      Prior Functioning/Environment Level of Independence: Needs assistance  Gait / Transfers Assistance Needed: per  pt report he uses a RW and 2 L O2 [place] at baseline.  He gets assistance for ADLs and gait.  ADL's / Homemaking Assistance Needed: Assist from caregivers   Comments: Pt with varied answers but reports using RW for ambulation in hallway at [facility]. Pt reports at least 2 falls at home and 1 at [place]         OT Problem List: Decreased strength;Decreased activity tolerance;Impaired balance (sitting and/or standing);Decreased cognition;Decreased safety awareness;Decreased knowledge of use of DME or AE;Cardiopulmonary status limiting activity      OT Treatment/Interventions: Self-care/ADL training;Therapeutic exercise;DME and/or AE instruction;Energy conservation;Therapeutic activities;Patient/family education    OT Goals(Current goals can be found in the care plan section) Acute Rehab OT Goals Patient Stated Goal: go back to [facility] for rehab OT Goal Formulation: With patient Time For Goal Achievement: 02/12/20 Potential to Achieve Goals: Good ADL Goals Pt Will Perform Grooming: with supervision;standing Pt Will Perform Lower Body Bathing: with min guard assist;sit to/from stand Pt Will Transfer to Toilet: with min guard assist;ambulating;bedside commode Pt Will Perform Toileting - Clothing Manipulation and hygiene: with min guard assist;sit to/from stand  OT Frequency: Min 2X/week   Barriers to D/C:            Co-evaluation  AM-PAC OT "6 Clicks" Daily Activity     Outcome Measure Help from another person eating meals?: A Little Help from another person taking care of personal grooming?: A Little Help from another person toileting, which includes using toliet, bedpan, or urinal?: A Lot Help from another person bathing (including washing, rinsing, drying)?: A Lot Help from another person to put on and taking off regular upper body clothing?: A Little Help from another person to put on and taking off regular lower body clothing?: A Little 6 Click Score: 16   End of  Session Equipment Utilized During Treatment: Gait belt;Rolling walker;Oxygen Nurse Communication: Mobility status;Other (comment) (O2)  Activity Tolerance: Patient tolerated treatment well;Treatment limited secondary to medical complications (Comment) (limited by desats`) Patient left: in chair;with call bell/phone within reach;with chair alarm set  OT Visit Diagnosis: Unsteadiness on feet (R26.81);Other abnormalities of gait and mobility (R26.89);Muscle weakness (generalized) (M62.81);History of falling (Z91.81);Other (comment) (decreased cardiopulmonary tolerance)                Time: 8833-7445 OT Time Calculation (min): 32 min Charges:  OT General Charges $OT Visit: 1 Visit OT Evaluation $OT Eval Moderate Complexity: 1 Mod  Layla Maw, OTR/L  Layla Maw 01/29/2020, 12:43 PM

## 2020-01-29 NOTE — TOC Progression Note (Signed)
Transition of Care [facility]) - Progression Note    Patient Details  Name: Robert Little MRN: [id number] Date of Birth: Oct 20, 1940  Transition of Care [facility]) CM/SW Contact  Carles Collet, RN Phone Number: 01/29/2020, 3:12 PM  Clinical Narrative:    PT and OT recs for SNF. Patient would like to return to [facility]. Spoke w admissions at [facility]. Patient was there form 5/28 to 7/21. Linus Orn states that he was discharged and the group home did not come to get him. He has an outstanding balance, she is going to check with her admission coordinator ans get back to [place] tomorrow to see if they can offer him a bed, sounds doubtful.  Patient is from [place]. Contact [name] [phone].     Expected Discharge Plan: Skilled Nursing Facility Barriers to Discharge: Continued Medical Work up  Expected Discharge Plan and Services Expected Discharge Plan: [place] In-house Referral: Clinical Social Work Discharge Planning Services: CM Consult Post Acute Care Choice: [place] Day arrangements for the past 2 months: [place]                                       Social Determinants of Health (SDOH) Interventions    Readmission Risk Interventions Readmission Risk Prevention Plan 11/23/2019 09/20/2019  Transportation Screening Complete Complete  PCP or Specialist Appt within 3-5 Days - Complete  HRI or [place] - Complete  Social Work Consult for [place] Planning/Counseling - Complete  Palliative Care Screening - Not Applicable  Medication Review Press photographer) Complete Complete  PCP or Specialist appointment within 3-5 days of discharge Complete -  [place] or Home Care Consult Complete -  SW Recovery Care/Counseling Consult Complete -  Palliative Care Screening Not Applicable -  [place] Complete -

## 2020-01-29 NOTE — Progress Notes (Signed)
PROGRESS NOTE    Robert Little  JKK:[id number] DOB: [birth year] DOA: 01/23/2020 PCP: [name]   Brief Narrative:  79 year old with history of pulmonary fibrosis, paroxysmal A. fib, diastolic CHF, CKD stage IV, anemia of chronic disease, COVID-19 pneumonia 2 months ago admitted for shortness of breath form SNF.  Prior to admission was treated with 5 days of empiric antibiotic for presumed pneumonia.  Upon admission he was quite hypoxic with concerns for CHF exacerbation versus pneumonia.  Initially had mild volume overload but eventually working diagnosis was UIP flare versus amiodarone toxicity.  Further work-up including CT confirmed this and patient was started on steroids which has been improving his symptoms.   Assessment & Plan:   Principal Problem:   Acute on chronic respiratory failure with hypoxia (HCC) Active Problems:   Essential hypertension   Pulmonary fibrosis (HCC)   Schizo affective schizophrenia ([place])   CKD (chronic kidney disease), stage IV (HCC)   Acute on chronic diastolic CHF (congestive heart failure) (HCC)   HCAP (healthcare-associated pneumonia)   Pressure injury of skin   Acute renal failure with acute renal cortical necrosis superimposed on stage 4 chronic kidney disease (HCC)   Malnutrition of moderate degree   Elevated troponin   Paroxysmal atrial fibrillation (HCC)  Acute hypoxic respiratory failure, slow improvement still on 4 L high flow Chronic congestive heart failure with preserved ejection fraction, 65% Presumed bilateral lower lobe pneumonia at admission Interstitial pulmonary fibrosis, amiodarone toxicity -Now on prednisone 40 mg p.o. daily, aggressive bronchodilators -CT chest shows extensive fibrocystic disease, [place]. -Echocardiogram 11/2019-EF 60 to 71%, grade 2 diastolic dysfunction -Appreciate cardiology and pulmonary recommendations -Pro-Cal negative-antibiotics stopped -Wean off oxygen as deemed  appropriate. -Respiratory viral panel-negative -Lower extremity Dopplers-negative.  VQ scan-some mismatch defect secondary to fibrosis but no evidence of filling defect concerning for PE. -Chest x-ray 7/23-shows extensive airspace opacity chronic interstitial lung disease  Mild leukocytosis -Secondary to steroids no evidence of infection  Steroid-induced hyperglycemia -Sliding scale and Accu-Chek -Add 10 units daily Lantus  Elevated troponin, suspect demand ischemia -Demand ischemia but should get further evaluation once medically stable. Echocardiogram shows EF 60 to 65%, grade 2 DD  Atrial fibrillation, paroxysmal -Amiodarone stopped -No anticoagulation due to risk of falls  Renal insufficiency chronic kidney disease stage IV -Around baseline creatinine of 2.4.  Continue to monitor. Creatinine 2.65  Anemia of chronic disease, hemoglobin 7.7 -Hemoglobin appears to be stable without any evidence of bleeding -Continue aspirin, iron supplements. -No obvious evidence of bleeding.  Monitor stress and on steroids, I will increase his PPI to twice daily  GERD -PPI twice daily AC  History of COPD History of pulmonary fibrosis with chronic hypoxia on 2 L nasal cannula -Aggressive bronchodilators, incentive spirometer and flutter valve.  History of schizoaffective disorder -Seroquel  BPH -Flomax  Had PT/OT now that his breathing symptoms are improving  DVT prophylaxis: heparin injection 5,000 Units Start: 01/24/20 0600 Code Status: Full Family Communication:  Spoke with [name] (Care giver, legal guardian) again yesterday.  We will continue to intermittently update him  Status is: Inpatient  Remains inpatient appropriate because:IV treatments appropriate due to intensity of illness or inability to take PO  Dispo: The patient is from: SNF              Anticipated d/c is to: SNF              Anticipated d/c date is: 2-3 days  Patient currently is not  medically stable to d/c.  Still hypoxic requiring 4 L high flow nasal cannula.  Body mass index is 22.12 kg/m.  Pressure Injury 11/27/19 Ear Left Stage 2 -  Partial thickness loss of dermis presenting as a shallow open injury with a red, pink wound bed without slough. Red/pink (Active)  11/27/19 1350  Location: Ear  Location Orientation: Left  Staging: Stage 2 -  Partial thickness loss of dermis presenting as a shallow open injury with a red, pink wound bed without slough.  Wound Description (Comments): Red/pink  Present on Admission: No     Pressure Injury 01/24/20 Buttocks Mid Stage 1 -  Intact skin with non-blanchable redness of a localized area usually over a bony prominence. (Active)  01/24/20 2300  Location: Buttocks  Location Orientation: Mid  Staging: Stage 1 -  Intact skin with non-blanchable redness of a localized area usually over a bony prominence.  Wound Description (Comments):   Present on Admission: Yes   Subjective: Patient looks much better today, was able to turn down his oxygen to 4 L nasal cannula while is in the room and his saturations are maintaining greater than 90%.  Review of Systems General: Denies fever, chills, night sweats or unintended weight loss. Resp: Denies hemoptysis Cardiac: Denies chest pain, palpitations, orthopnea, paroxysmal nocturnal dyspnea. GI: Denies abdominal pain, nausea, vomiting, diarrhea or constipation GU: Denies dysuria, frequency, hesitancy or incontinence MS: Denies muscle aches, joint pain or swelling Neuro: Denies headache, neurologic deficits (focal weakness, numbness, tingling), abnormal gait Psych: Denies anxiety, depression, SI/HI/AVH Skin: Denies new rashes or lesions ID: Denies sick contacts, exotic exposures, travel  Examination: Constitutional: Not in acute distress, 4 L nasal cannula Respiratory: Mild coarse breath sounds but greatly improved compared to yesterday no longer wheezing Cardiovascular: Normal sinus  rhythm, no rubs Abdomen: Nontender nondistended good bowel sounds Musculoskeletal: No edema noted Skin: No rashes seen Neurologic: CN 2-12 grossly intact.  And nonfocal Psychiatric: Normal judgment and insight. Alert and oriented x 3. Normal mood. Objective: Vitals:   01/28/20 1921 01/28/20 2233 01/29/20 0312 01/29/20 0328  BP: (!) 128/57 (!) 133/54 (!) 149/62   Pulse: 68 61 50   Resp: 20 16 20    Temp: 98.2 F (36.8 C) (!) 97 F (36.1 C) 98.1 F (36.7 C)   TempSrc:      SpO2: 90% 93% 100%   Weight:    66 kg  Height:        Intake/Output Summary (Last 24 hours) at 01/29/2020 0733 Last data filed at 01/28/2020 1800 Gross per 24 hour  Intake 1140 ml  Output 600 ml  Net 540 ml   Filed Weights   01/26/20 0657 01/27/20 0626 01/29/20 0328  Weight: 73 kg 66.2 kg 66 kg   Data Reviewed:   CBC: Recent Labs  Lab 01/23/20 2343 01/23/20 2343 01/24/20 0619 01/24/20 0619 01/25/20 0728 01/26/20 0357 01/27/20 0338 01/28/20 0157 01/29/20 0158  WBC 9.0   < > 8.3   < > 8.5 8.2 10.6* 13.8* 15.0*  NEUTROABS 7.3  --  5.1  --   --   --   --   --   --   HGB 8.2*   < > 7.3*   < > 8.0* 7.5* 8.0* 7.7* 8.0*  HCT 27.8*   < > 25.2*   < > 26.6* 24.1* 26.0* 24.4* 25.8*  MCV 93.6   < > 95.1   < > 90.2 89.3 87.8 88.1 86.9  PLT 198   < >  177   < > 211 223 250 253 301   < > = values in this interval not displayed.   Basic Metabolic Panel: Recent Labs  Lab 01/25/20 0728 01/26/20 0357 01/27/20 0338 01/28/20 0157 01/29/20 0158  NA 138 138 135 131* 134*  K 4.4 4.2 5.0 4.9 4.8  CL 99 99 97* 95* 98  CO2 28 27 25 25 26   GLUCOSE 135* 121* 206* 305* 270*  BUN 32* 38* 51* 66* 76*  CREATININE 2.99* 2.98* 3.06* 2.99* 2.65*  CALCIUM 8.8* 8.7* 9.1 9.1 9.2  MG 1.8 1.9 2.1 1.9 1.9   GFR: Estimated Creatinine Clearance: 21.8 mL/min (A) (by C-G formula based on SCr of 2.65 mg/dL (H)). Liver Function Tests: Recent Labs  Lab 01/23/20 2343 01/24/20 0619  AST 14* 15  ALT 11 9  ALKPHOS 86 67   BILITOT 0.7 1.0  PROT 7.7 6.0*  ALBUMIN 3.0* 2.5*   No results for input(s): LIPASE, AMYLASE in the last 168 hours. No results for input(s): AMMONIA in the last 168 hours. Coagulation Profile: Recent Labs  Lab 01/24/20 0049  INR 1.1   Cardiac Enzymes: Recent Labs  Lab 01/24/20 0619  CKTOTAL 129   BNP (last 3 results) No results for input(s): PROBNP in the last 8760 hours. HbA1C: No results for input(s): HGBA1C in the last 72 hours. CBG: Recent Labs  Lab 01/28/20 1302 01/28/20 1656 01/28/20 2057  GLUCAP 453* 353* 233*   Lipid Profile: No results for input(s): CHOL, HDL, LDLCALC, TRIG, CHOLHDL, LDLDIRECT in the last 72 hours. Thyroid Function Tests: No results for input(s): TSH, T4TOTAL, FREET4, T3FREE, THYROIDAB in the last 72 hours. Anemia Panel: Recent Labs    01/29/20 0158  VITAMINB12 1,337*  FERRITIN 1,101*  TIBC 227*  IRON 62   Sepsis Labs: Recent Labs  Lab 01/23/20 2343 01/24/20 0619 01/24/20 0833  PROCALCITON  --   --  <0.10  LATICACIDVEN 2.1* 1.8 2.0*    Recent Results (from the past 240 hour(s))  Culture, blood (routine x 2)     Status: None (Preliminary result)   Collection Time: 01/23/20 11:45 PM   Specimen: BLOOD  Result Value Ref Range Status   Specimen Description BLOOD RIGHT ANTECUBITAL  Final   Special Requests   Final    BOTTLES DRAWN AEROBIC AND ANAEROBIC Blood Culture adequate volume   Culture   Final    NO GROWTH 4 DAYS Performed at [facility], [place] [address] [place], [place] [address]    Report Status PENDING  Incomplete  Culture, blood (routine x 2)     Status: None (Preliminary result)   Collection Time: 01/23/20 11:47 PM   Specimen: BLOOD  Result Value Ref Range Status   Specimen Description BLOOD LEFT ANTECUBITAL  Final   Special Requests   Final    BOTTLES DRAWN AEROBIC AND ANAEROBIC Blood Culture adequate volume   Culture   Final    NO GROWTH 4 DAYS Performed at [facility], [place] [address],  [place], [place] [address]    Report Status PENDING  Incomplete  Urine culture     Status: None   Collection Time: 01/24/20  2:58 AM   Specimen: Urine, Random  Result Value Ref Range Status   Specimen Description URINE, RANDOM  Final   Special Requests NONE  Final   Culture   Final    NO GROWTH Performed at [facility], [place] [address], [place], [place] [address]  Report Status 01/24/2020 FINAL  Final  Respiratory Panel by PCR     Status: None   Collection Time: 01/25/20 12:36 PM   Specimen: Nasopharyngeal Swab; Respiratory  Result Value Ref Range Status   Adenovirus NOT DETECTED NOT DETECTED Final   Coronavirus 229E NOT DETECTED NOT DETECTED Final    Comment: (NOTE) The Coronavirus on the Respiratory Panel, DOES NOT test for the novel  Coronavirus (2019 nCoV)    Coronavirus HKU1 NOT DETECTED NOT DETECTED Final   Coronavirus NL63 NOT DETECTED NOT DETECTED Final   Coronavirus OC43 NOT DETECTED NOT DETECTED Final   Metapneumovirus NOT DETECTED NOT DETECTED Final   Rhinovirus / Enterovirus NOT DETECTED NOT DETECTED Final   Influenza A NOT DETECTED NOT DETECTED Final   Influenza B NOT DETECTED NOT DETECTED Final   Parainfluenza Virus 1 NOT DETECTED NOT DETECTED Final   Parainfluenza Virus 2 NOT DETECTED NOT DETECTED Final   Parainfluenza Virus 3 NOT DETECTED NOT DETECTED Final   Parainfluenza Virus 4 NOT DETECTED NOT DETECTED Final   Respiratory Syncytial Virus NOT DETECTED NOT DETECTED Final   Bordetella pertussis NOT DETECTED NOT DETECTED Final   Chlamydophila pneumoniae NOT DETECTED NOT DETECTED Final   Mycoplasma pneumoniae NOT DETECTED NOT DETECTED Final    Comment: Performed at [facility], [place]. [address] [place], [place] [address]     Radiology Studies: No results found.      Scheduled Meds: . aspirin EC  81 mg Oral Daily  . atorvastatin  20 mg Oral QHS  . clonazePAM  0.5 mg Oral BID  . feeding supplement (ENSURE ENLIVE)  237 mL Oral BID BM  .  ferrous sulfate  325 mg Oral BID WC  . fluticasone furoate-vilanterol  1 puff Inhalation Daily  . guaiFENesin  600 mg Oral BID  . heparin  5,000 Units Subcutaneous Q8H  . insulin aspart  0-5 Units Subcutaneous QHS  . insulin aspart  0-6 Units Subcutaneous TID WC  . insulin glargine  10 Units Subcutaneous Daily  . magnesium oxide  400 mg Oral Daily  . multivitamin with minerals  1 tablet Oral Daily  . pantoprazole  40 mg Oral BID AC  . predniSONE  40 mg Oral Q breakfast  . QUEtiapine  25 mg Oral QHS  . senna-docusate  2 tablet Oral QHS  . tamsulosin  0.8 mg Oral QPC supper  . umeclidinium bromide  1 puff Inhalation Daily  . vitamin B-12  500 mcg Oral BID   Continuous Infusions:    LOS: 5 days   Time spent= 35 mins  Jahzion Brogden Arsenio Loader, MD [facility]  If 7PM-7AM, please contact night-coverage  01/29/2020, 7:33 AM

## 2020-01-29 NOTE — Progress Notes (Signed)
NAME:  Robert Little, MRN:  [id number], DOB:  05-06-1941, LOS: 5 ADMISSION DATE:  01/23/2020, CONSULTATION DATE:  7/24 REFERRING MD:  Reesa Chew, CHIEF COMPLAINT:  Dyspnea   Brief History   79 yo male former smoker presented with progressive dyspnea and hypoxia.  Has hx of pulmonary fibrosis with UIP complicated by COVID 19 infection in May 2021.  Also has aspiration pneumonitis.  PCCM asked to assist with respiratory management.  Past Medical History  Schizoaffective schizophrenia Hypertension GERD Diabetes mellitus Coronary artery disease COVID-19 pneumonia and May 2021 CKD stage IV Chronic respiratory failure with hypoxemia on 2 L nasal cannula since March 2021 Atrial fibrillation diagnosed while hospitalized in May 2021 for COVID-19 Dysphagia with silent aspiration seen in May 2021 modified barium swallow  Significant Hospital Events   7/21 Admit 7/24 start steroids  Consults:    Procedures:    Significant Diagnostic Tests:   Echo 7/22 >> EF 60 to 65%, grade 2 DD, mild MR  V/Q scan 7/23 >> very low probability for PE  CT chest 7/24 >> atherosclerosis, widespread GGO/septal thickening/traction BTX, some areas of honeycombing b/l  Micro Data:  Blood 7/21 >>  RVP 7/23 >> negative  Antimicrobials:  7/21 cefepime x1 7/21 doxycycline x1 7/21 vancomycin x1  Interim history/subjective:  Breathing better.  Was able to walk in hallway some.  Objective   Blood pressure (!) 110/45, pulse 59, temperature 97.9 F (36.6 C), temperature source Oral, resp. rate 18, height 5\' 8"  (1.727 m), weight 66 kg, SpO2 100 %.        Intake/Output Summary (Last 24 hours) at 01/29/2020 1501 Last data filed at 01/28/2020 1800 Gross per 24 hour  Intake 440 ml  Output --  Net 440 ml   Filed Weights   01/26/20 0657 01/27/20 0626 01/29/20 0328  Weight: 73 kg 66.2 kg 66 kg    Examination:  General - alert Eyes - pupils reactive ENT - no sinus tenderness, no stridor, poor  dentition Cardiac - regular rate/rhythm, no murmur Chest - basilar crackles Abdomen - soft, non tender, + bowel sounds Extremities - no cyanosis, clubbing, or edema Skin - no rashes Neuro - normal strength, moves extremities, follows commands Psych - normal mood and behavior    Resolved Hospital Problem list     Assessment & Plan:   Acute on chronic hypoxic respiratory failure. - has ILD/UIP, COVID PNA in May 2021 and more recently has been on amiodarone since May 2021 for A fib - concern that acute worsening might be related to amiodarone lung toxicity - amiodarone d/c'ed on 7/24 and pt started on solumedrol; since then has improved O2 needs - continue prednisone 40 mg daily for now - f/u CXR 7/28 - goal SpO2 90 to 95%  Dysphagia with silent aspiration. - D3 diet  Emphysema. - continue breo, incruse  Steroid induced hyperglycemia. - SSI per primary team  Acute on chronic diastolic CHF. Hx of CAD s/p CABG. Hx of a fib. - per primary team and cardiology  Labs    CMP Latest Ref Rng & Units 01/29/2020 01/28/2020 01/27/2020  Glucose 70 - 99 mg/dL 270(H) 305(H) 206(H)  BUN 8 - 23 mg/dL 76(H) 66(H) 51(H)  Creatinine 0.61 - 1.24 mg/dL 2.65(H) 2.99(H) 3.06(H)  Sodium 135 - 145 mmol/L 134(L) 131(L) 135  Potassium 3.5 - 5.1 mmol/L 4.8 4.9 5.0  Chloride 98 - 111 mmol/L 98 95(L) 97(L)  CO2 22 - 32 mmol/L 26 25 25   Calcium 8.9 - 10.3 mg/dL  9.2 9.1 9.1  Total Protein 6.5 - 8.1 g/dL - - -  Total Bilirubin 0.3 - 1.2 mg/dL - - -  Alkaline Phos 38 - 126 U/L - - -  AST 15 - 41 U/L - - -  ALT 0 - 44 U/L - - -    CBC Latest Ref Rng & Units 01/29/2020 01/28/2020 01/27/2020  WBC 4.0 - 10.5 K/uL 15.0(H) 13.8(H) 10.6(H)  Hemoglobin 13.0 - 17.0 g/dL 8.0(L) 7.7(L) 8.0(L)  Hematocrit 39 - 52 % 25.8(L) 24.4(L) 26.0(L)  Platelets 150 - 400 K/uL 301 253 250    CBG (last 3)  Recent Labs    01/28/20 2057 01/29/20 0818 01/29/20 1230  GLUCAP 233* 219* 352*    Signature:  Chesley Mires,  MD [place] Pager - [phone] 01/29/2020, 3:01 PM

## 2020-01-29 NOTE — Progress Notes (Signed)
Roughly even in/out and unchanged weight approx 146 lb. Weight at March DC was 151 lb; 138 lb at 01/09/2020 office visit, but felt to be dry. Creatinine better at 2.6 (baseline 2.0-2.5). Keep off diuretics for now. Reevaluate daily.

## 2020-01-29 NOTE — Evaluation (Signed)
Physical Therapy Evaluation Patient Details Name: Robert Little MRN: [id number] DOB: May 18, 1941 Today's Date: 01/29/2020   History of Present Illness  79 y.o. male with medical history significant for chronic anemia, pulmonary fibrosis, hypertension, coronary artery disease, chronic diastolic CHF, and schizoaffective disorder, presenting to the emergency department on 7/22 with progressive shortness of breath long with severe orthopnea starting 1 to 2 days prior to hospital visit. work-up suggestive of CHF, PNA with acute hypoxic respiratory failure.  Clinical Impression   Pt presents with generalized weakness, impaired sitting and standing balance with history of falls, difficulty performing mobility tasks, dyspnea on exertion with accompanying O2 desaturation requiring up to 6LO2 during mobility, and decreased activity tolerance. Pt to benefit from acute PT to address deficits. Pt ambulated short room distance requiring min +2 overall for mobility on eval, PT and OT saw pt together given pt's deficits and suspected debility but likely can transition to +1 next visit. PT recommending return to SNF post-acutely to progress mobility and monitor O2 with mobility. PT to progress mobility as tolerated, and will continue to follow acutely.      Follow Up Recommendations SNF;Supervision/Assistance - 24 hour    Equipment Recommendations  None recommended by PT    Recommendations for Other Services       Precautions / Restrictions Precautions Precautions: Fall Precaution Comments: monitor O2 Restrictions Weight Bearing Restrictions: No      Mobility  Bed Mobility Overal bed mobility: Needs Assistance Bed Mobility: Supine to Sit     Supine to sit: Min assist     General bed mobility comments: Min assist for scooting LEs to EOB, pt able to pull to sit with use of bedrails and increased time.  Transfers Overall transfer level: Needs assistance Equipment used: Rolling walker (2  wheeled) Transfers: Sit to/from Stand Sit to Stand: Min assist;+2 safety/equipment;+2 physical assistance Stand pivot transfers: Min assist       General transfer comment: Min +2 for power up, steadying, and VCs for hand placement when rising. Repeated cuing needed, secondary to [facility] vs cognitive involvement.  Ambulation/Gait Ambulation/Gait assistance: Min assist Gait Distance (Feet): 10 Feet Assistive device: Rolling walker (2 wheeled) Gait Pattern/deviations: Step-through pattern;Decreased stride length;Trunk flexed;Drifts right/left Gait velocity: decr   General Gait Details: min assist to steady, guide pt and RW. Verbal cuing for upright posture, placement in RW, room navigation.  Stairs            Wheelchair Mobility    Modified Rankin (Stroke Patients Only)       Balance Overall balance assessment: Needs assistance Sitting-balance support: Feet supported Sitting balance-Leahy Scale: Fair Sitting balance - Comments: can maintain static sitting balance but LOB posteriorly and towards L when attempting to don socks EOB   Standing balance support: Bilateral upper extremity supported;During functional activity Standing balance-Leahy Scale: Poor Standing balance comment: Reliant on B UE support during mobility                             Pertinent Vitals/Pain Pain Assessment: No/denies pain    Home Living Family/patient expects to be discharged to:: Group home                 Additional Comments: Poor historian    Prior Function Level of Independence: Needs assistance   Gait / Transfers Assistance Needed: per pt report he uses a RW and 2 L O2 [place] at baseline.  He gets assistance for ADLs and  gait.   ADL's / Homemaking Assistance Needed: Assist from caregivers  Comments: Pt with varied answers but reports using RW for ambulation in hallway at [facility]. Pt reports at least 2 falls at home and 1 at [place] [facility]:  Right    Extremity/Trunk Assessment   Upper Extremity Assessment Upper Extremity Assessment: Defer to OT evaluation    Lower Extremity Assessment Lower Extremity Assessment: Generalized weakness    Cervical / Trunk Assessment Cervical / Trunk Assessment: Kyphotic  Communication   Communication: HOH;Expressive difficulties (mostly incoherent speech, very loud)  Cognition Arousal/Alertness: Awake/alert Behavior During Therapy: Restless Overall Cognitive Status: History of cognitive impairments - at baseline                                 General Comments: Pt answered all orientation questions appropriately but noted to be tangential with incoherent speech (often talking about money in different states). Pt requires frequent cues to perform pursed lip breathing rather than continuing to talk. Poor safety awareness, requires cues throughout mobility.      General Comments General comments (skin integrity, edema, etc.): 3LO2 upon PT arrival to room, drops to 79% when moving to EOB and talking to PT/OT. PT increased SpO2 to 4, then 6LO2 to recover sats to 90% during mobility. Pt with period 78-82% during gait on 6LO2, quickly recovered to >88% with seated rest and cuing for breathing technique.    Exercises General Exercises - Lower Extremity Quad Sets: AROM;Both;5 reps;Seated Hip ABduction/ADduction: AROM;Both;10 reps;Seated   Assessment/Plan    PT Assessment Patient needs continued PT services  PT Problem List Decreased strength;Decreased mobility;Decreased safety awareness;Cardiopulmonary status limiting activity;Decreased activity tolerance;Decreased balance;Decreased knowledge of use of DME;Decreased cognition;Decreased knowledge of precautions       PT Treatment Interventions DME instruction;Therapeutic activities;Gait training;Therapeutic exercise;Patient/family education;Balance training;Functional mobility training;Neuromuscular re-education    PT Goals  (Current goals can be found in the Care Plan section)  Acute Rehab PT Goals Patient Stated Goal: go [facility] to [facility] PT Goal Formulation: With patient Time For Goal Achievement: 02/12/20 Potential to Achieve Goals: Good    Frequency Min 2X/week   Barriers to discharge        Co-evaluation PT/OT/SLP Co-Evaluation/Treatment: Yes Reason for Co-Treatment: Necessary to address cognition/behavior during functional activity;For patient/therapist safety;To address functional/ADL transfers PT goals addressed during session: Mobility/safety with mobility         AM-PAC PT "6 Clicks" Mobility  Outcome Measure Help needed turning from your back to your side while in a flat bed without using bedrails?: A Little Help needed moving from lying on your back to sitting on the side of a flat bed without using bedrails?: A Little Help needed moving to and from a bed to a chair (including a wheelchair)?: A Little Help needed standing up from a chair using your arms (e.g., wheelchair or bedside chair)?: A Little Help needed to walk in hospital room?: A Little Help needed climbing 3-5 steps with a railing? : A Lot 6 Click Score: 17    End of Session Equipment Utilized During Treatment: Gait belt Activity Tolerance: Patient limited by fatigue;Patient tolerated treatment well Patient left: in chair;with chair alarm set;with call bell/phone within reach Nurse Communication: Mobility status PT Visit Diagnosis: Other abnormalities of gait and mobility (R26.89);Muscle weakness (generalized) (M62.81)    Time: 2119-4174 PT Time Calculation (min) (ACUTE ONLY): 30  min   Charges:   PT Evaluation $PT Eval Low Complexity: 1 Low         Vitaliy Eisenhour E, PT Acute Rehabilitation Services Pager [phone]  Office [phone]   Roxine Caddy D Elonda Husky 01/29/2020, 2:12 PM

## 2020-01-30 ENCOUNTER — Telehealth: Payer: Self-pay | Admitting: *Deleted

## 2020-01-30 ENCOUNTER — Inpatient Hospital Stay (HOSPITAL_COMMUNITY): Payer: Medicare Other

## 2020-01-30 DIAGNOSIS — I25810 Atherosclerosis of coronary artery bypass graft(s) without angina pectoris: Secondary | ICD-10-CM

## 2020-01-30 DIAGNOSIS — I5032 Chronic diastolic (congestive) heart failure: Secondary | ICD-10-CM

## 2020-01-30 LAB — GLUCOSE, CAPILLARY
Glucose-Capillary: 142 mg/dL — ABNORMAL HIGH (ref 70–99)
Glucose-Capillary: 148 mg/dL — ABNORMAL HIGH (ref 70–99)
Glucose-Capillary: 353 mg/dL — ABNORMAL HIGH (ref 70–99)
Glucose-Capillary: 441 mg/dL — ABNORMAL HIGH (ref 70–99)
Glucose-Capillary: 308 mg/dL — ABNORMAL HIGH (ref 70–99)

## 2020-01-30 LAB — CBC
HCT: 28 % — ABNORMAL LOW (ref 39.0–52.0)
Hemoglobin: 8.6 g/dL — ABNORMAL LOW (ref 13.0–17.0)
MCH: 26.7 pg (ref 26.0–34.0)
MCHC: 30.7 g/dL (ref 30.0–36.0)
MCV: 87 fL (ref 80.0–100.0)
Platelets: 301 10*3/uL (ref 150–400)
RBC: 3.22 MIL/uL — ABNORMAL LOW (ref 4.22–5.81)
RDW: 16.6 % — ABNORMAL HIGH (ref 11.5–15.5)
WBC: 12.4 10*3/uL — ABNORMAL HIGH (ref 4.0–10.5)
nRBC: 0 % (ref 0.0–0.2)

## 2020-01-30 LAB — BASIC METABOLIC PANEL
Anion gap: 9 (ref 5–15)
BUN: 73 mg/dL — ABNORMAL HIGH (ref 8–23)
CO2: 26 mmol/L (ref 22–32)
Calcium: 9.5 mg/dL (ref 8.9–10.3)
Chloride: 97 mmol/L — ABNORMAL LOW (ref 98–111)
Creatinine, Ser: 2.34 mg/dL — ABNORMAL HIGH (ref 0.61–1.24)
GFR calc Af Amer: 30 mL/min — ABNORMAL LOW (ref >60)
GFR calc non Af Amer: 26 mL/min — ABNORMAL LOW (ref >60)
Glucose, Bld: 228 mg/dL — ABNORMAL HIGH (ref 70–99)
Potassium: 4.8 mmol/L (ref 3.5–5.1)
Sodium: 132 mmol/L — ABNORMAL LOW (ref 135–145)

## 2020-01-30 LAB — FOLATE RBC
Folate, Hemolysate: 393 ng/mL
Folate, RBC: 1500 ng/mL (ref >498)
Hematocrit: 26.2 % — ABNORMAL LOW (ref 37.5–51.0)

## 2020-01-30 LAB — MAGNESIUM: Magnesium: 2 mg/dL (ref 1.7–2.4)

## 2020-01-30 MED ORDER — INSULIN ASPART 100 UNIT/ML ~~LOC~~ SOLN
20.0000 [IU] | Freq: Once | SUBCUTANEOUS | Status: AC
  Administered 2020-01-30: 20 [IU] via SUBCUTANEOUS

## 2020-01-30 NOTE — Telephone Encounter (Signed)
-----   Message from Rigoberto Noel, MD sent at 01/30/2020  4:40 PM EDT ----- Please make follow-up appointment with APP/VS/ILD clinic -post hospital follow-up in 4 weeks  RA

## 2020-01-30 NOTE — Progress Notes (Signed)
Inpatient Diabetes Program Recommendations  AACE/ADA: New Consensus Statement on Inpatient Glycemic Control (2015)  Target Ranges:  Prepandial:   less than 140 mg/dL      Peak postprandial:   less than 180 mg/dL (1-2 hours)      Critically ill patients:  140 - 180 mg/dL   Results for Robert Little, Robert Little (MRN [id number]) as of 01/30/2020 12:05  Ref. Range 01/30/2020 08:03 01/30/2020 11:33  Glucose-Capillary Latest Ref Range: 70 - 99 mg/dL 148 (H) 353 (H)    Current Orders: Lantus 10 units Daily      Novolog 0-6 units TID ac/hs     MD- Note that Lantus 10 units Daily started yesterday AM.  Getting Prednisone 40 mg Daily.  Post-meal CBGs elevated likely due to the effects of the Prednisone.  Please consider adding Novolog Meal Coverage to current inpatient insulin regimen:  Novolog 4 units TID with meals    --Will follow patient during hospitalization--  Wyn Quaker RN, MSN, CDE Diabetes Coordinator Inpatient Glycemic Control Team Team Pager: [phone] (8a-5p)

## 2020-01-30 NOTE — TOC Progression Note (Signed)
Transition of Care [facility]) - Progression Note    Patient Details  Name: Robert Little MRN: [id number] Date of Birth: Mar 27, 1941  Transition of Care [facility]) CM/SW Contact  Carles Collet, RN Phone Number: 01/30/2020, 12:25 PM  Clinical Narrative:    Verner Mould at [place], she has not heard back from her leadership as to if they are able to offer a bed. She is awaiting a return to an email she sent yesterday. Patient has outstanding balance at [facility]. Spoke w [name], guardian, [phone]. He states he is on a payment plan with [employer]. He states that he would be able to pay the amount in full when [place]'s check comes in between Aug 1-3.  I requested Legrand Como to call [employer] today and notify them of his intention. He did, but had to leave a message with the Chief Strategy Officer. TOC will continue to follow.      Expected Discharge Plan: Skilled Nursing Facility Barriers to Discharge: Continued Medical Work up  Expected Discharge Plan and Services Expected Discharge Plan: [place] In-house Referral: Clinical Social Work Discharge Planning Services: CM Consult Post Acute Care Choice: [place] arrangements for the past 2 months: [place]                                       Social Determinants of Health (SDOH) Interventions    Readmission Risk Interventions Readmission Risk Prevention Plan 11/23/2019 09/20/2019  Transportation Screening Complete Complete  PCP or Specialist Appt within 3-5 Days - Complete  HRI or [place] - Complete  Social Work Consult for [place] Planning/Counseling - Complete  Palliative Care Screening - Not Applicable  Medication Review Press photographer) Complete Complete  PCP or Specialist appointment within 3-5 days of discharge Complete -  [place] or Home Care Consult Complete -  SW Recovery Care/Counseling Consult Complete -  Palliative Care Screening Not Applicable -   [place] Complete -

## 2020-01-30 NOTE — Progress Notes (Signed)
Dr. Sallyanne Kuster recommended follow-up for this patient in about a month. He originally had follow-up on 8/5 which Dr. Loletha Grayer felt was too close to be necessary so we r/s for 9/2. Appt info placed on AVS. Janessa Mickle PA-C

## 2020-01-30 NOTE — Progress Notes (Signed)
PROGRESS NOTE        PATIENT DETAILS Name: Robert Little Age: 79 y.o. Sex: male Date of Birth: 09/05/1940 Admit Date: 01/23/2020 Admitting Physician Rise Patience, MD SUP:JSRP, [name] Total Access  Brief Narrative: Patient is a 79 y.o. male with history of pulmonary fibrosis, PAF, chronic diastolic heart failure, CKD stage IV, anemia of chronic disease-presented with shortness of breath-found to have acute hypoxic respiratory failure-thought to be from amiodarone induced lung toxicity.  See below for further details.  Significant events: 7/21>> admit for hypoxia-thought to be from amiodarone induced lung toxicity.  Significant studies: 7/24>> CT chest: Compatible with interstitial lung disease 7/22>> Echo: EF 59-45%, grade 2 diastolic dysfunction.  Antimicrobial therapy: None  Microbiology data: 7/21>> blood culture: No growth 7/22>> urine culture: No growth  Procedures : None  Consults: Cardiology, PCCM  DVT Prophylaxis : heparin injection 5,000 Units Start: 01/24/20 0600   Subjective: Lying comfortably in bed-no major issues overnight.  Assessment/Plan: Acute on chronic hypoxic respiratory failure: Thought to be secondary to amiodarone induced lung toxicity-continue steroids-continue attempts to titrate down FiO2.  Amiodarone remains on hold.    Minimally elevated troponins: Demand ischemia-not consistent with ACS-echo with stable findings.  No further work-up planned.  Chronic diastolic heart failure: Euvolemic-no signs of volume overload.  PAF: Rate controlled-not on anticoagulation due to fall risk.  DM-2 (A1c 7.0 on 09/20/2019): CBG stable-continue Lantus and SSI.  Follow and adjust.  Recent Labs    01/30/20 0634 01/30/20 0803 01/30/20 1133  GLUCAP 142* 148* 353*    CKD stage IV: Creatinine at baseline-follow periodically  Normocytic anemia: Hemoglobin stable-no indication for transfusion-no obvious GI bleeding  apparent.  GERD: Continue PPI twice daily  History of COPD/pulmonary fibrosis with chronic hypoxia on 2 L of oxygen at all times: Continue bronchodilators/incentive spirometry/flutter valve-on steroids for amiodarone induced lung toxicity.  History of schizoaffective disorder: Continue Seroquel  BPH: Continue Flomax  Nutrition Problem: Nutrition Problem: Moderate Malnutrition Etiology: chronic illness (CHF, CKD stage IV) Signs/Symptoms: mild fat depletion, moderate fat depletion, mild muscle depletion, percent weight loss (10.5% weight loss in less than 3 months) Percent weight loss: 10.5 % (less than 3 months) Interventions: Ensure Enlive (each supplement provides 350kcal and 20 grams of protein), MVI   Diet: Diet Order            DIET DYS 3 Room service appropriate? Yes; Fluid consistency: Thin  Diet effective now                  Code Status: Full code  Family Communication: None at bedside  Disposition Plan: Status is: Inpatient  Remains inpatient appropriate because:Inpatient level of care appropriate due to severity of illness   Dispo: The patient is from: SNF              Anticipated d/c is to: SNF              Anticipated d/c date is: 2 days              Patient currently is not medically stable to d/c.   Barriers to Discharge: Hypoxia requiring O2 supplementation due to amiodarone induced lung fibrosis.  Antimicrobial agents: Anti-infectives (From admission, onward)   Start     Dose/Rate Route Frequency Ordered Stop   01/25/20 0000  ceFEPIme (MAXIPIME) 2 g in sodium chloride 0.9 %  100 mL IVPB  Status:  Discontinued        2 g 200 mL/hr over 30 Minutes Intravenous Every 24 hours 01/24/20 0525 01/24/20 1330   01/25/20 0000  vancomycin (VANCOREADY) IVPB 750 mg/150 mL  Status:  Discontinued        750 mg 150 mL/hr over 60 Minutes Intravenous Every 24 hours 01/24/20 0525 01/24/20 1330   01/24/20 0600  doxycycline (VIBRAMYCIN) 100 mg in sodium chloride 0.9  % 250 mL IVPB  Status:  Discontinued        100 mg 125 mL/hr over 120 Minutes Intravenous Every 12 hours 01/24/20 0509 01/24/20 1330   01/24/20 0100  vancomycin (VANCOREADY) IVPB 1250 mg/250 mL        1,250 mg 166.7 mL/hr over 90 Minutes Intravenous  Once 01/24/20 0053 01/24/20 0352   01/24/20 0100  ceFEPIme (MAXIPIME) 2 g in sodium chloride 0.9 % 100 mL IVPB        2 g 200 mL/hr over 30 Minutes Intravenous  Once 01/24/20 0053 01/24/20 0252       Time spent: 25- minutes-Greater than 50% of this time was spent in counseling, explanation of diagnosis, planning of further management, and coordination of care.  MEDICATIONS: Scheduled Meds: . aspirin EC  81 mg Oral Daily  . atorvastatin  20 mg Oral QHS  . clonazePAM  0.5 mg Oral BID  . feeding supplement (ENSURE ENLIVE)  237 mL Oral BID BM  . ferrous sulfate  325 mg Oral BID WC  . fluticasone furoate-vilanterol  1 puff Inhalation Daily  . guaiFENesin  600 mg Oral BID  . heparin  5,000 Units Subcutaneous Q8H  . insulin aspart  0-5 Units Subcutaneous QHS  . insulin aspart  0-6 Units Subcutaneous TID WC  . insulin glargine  10 Units Subcutaneous Daily  . magnesium oxide  400 mg Oral Daily  . multivitamin with minerals  1 tablet Oral Daily  . pantoprazole  40 mg Oral BID AC  . predniSONE  40 mg Oral Q breakfast  . QUEtiapine  25 mg Oral QHS  . senna-docusate  2 tablet Oral QHS  . tamsulosin  0.8 mg Oral QPC supper  . umeclidinium bromide  1 puff Inhalation Daily  . vitamin B-12  500 mcg Oral BID   Continuous Infusions: PRN Meds:.acetaminophen, albuterol, ipratropium-albuterol, ondansetron **OR** ondansetron (ZOFRAN) IV, polyethylene glycol   PHYSICAL EXAM: Vital signs: Vitals:   01/29/20 2031 01/30/20 0400 01/30/20 0804 01/30/20 0827  BP: (!) 130/72 (!) 138/56 (!) 145/55   Pulse: 72 57 56   Resp: 20 18 18    Temp: 98.2 F (36.8 C) 97.9 F (36.6 C) 98.6 F (37 C)   TempSrc:  Oral    SpO2: 91% 100% 100% 92%  Weight:        Height:       Filed Weights   01/26/20 0657 01/27/20 0626 01/29/20 0328  Weight: 73 kg 66.2 kg 66 kg   Body mass index is 22.12 kg/m.   Gen Exam:Alert awake-not in any distress HEENT:atraumatic, normocephalic Chest: B/L clear to auscultation anteriorly CVS:S1S2 regular Abdomen:soft non tender, non distended Extremities:no edema Neurology: Non focal Skin: no rash  I have personally reviewed following labs and imaging studies  LABORATORY DATA: CBC: Recent Labs  Lab 01/23/20 2343 01/23/20 2343 01/24/20 8338 01/25/20 0728 01/26/20 0357 01/27/20 0338 01/28/20 0157 01/29/20 0158 01/30/20 0157  WBC 9.0   < > 8.3   < > 8.2 10.6* 13.8* 15.0* 12.4*  NEUTROABS 7.3  --  5.1  --   --   --   --   --   --   HGB 8.2*   < > 7.3*   < > 7.5* 8.0* 7.7* 8.0* 8.6*  HCT 27.8*   < > 25.2*   < > 24.1* 26.0* 24.4* 25.8* 28.0*  MCV 93.6   < > 95.1   < > 89.3 87.8 88.1 86.9 87.0  PLT 198   < > 177   < > 223 250 253 301 301   < > = values in this interval not displayed.    Basic Metabolic Panel: Recent Labs  Lab 01/26/20 0357 01/27/20 0338 01/28/20 0157 01/29/20 0158 01/30/20 0157  NA 138 135 131* 134* 132*  K 4.2 5.0 4.9 4.8 4.8  CL 99 97* 95* 98 97*  CO2 27 25 25 26 26   GLUCOSE 121* 206* 305* 270* 228*  BUN 38* 51* 66* 76* 73*  CREATININE 2.98* 3.06* 2.99* 2.65* 2.34*  CALCIUM 8.7* 9.1 9.1 9.2 9.5  MG 1.9 2.1 1.9 1.9 2.0    GFR: Estimated Creatinine Clearance: 24.7 mL/min (A) (by C-G formula based on SCr of 2.34 mg/dL (H)).  Liver Function Tests: Recent Labs  Lab 01/23/20 2343 01/24/20 0619  AST 14* 15  ALT 11 9  ALKPHOS 86 67  BILITOT 0.7 1.0  PROT 7.7 6.0*  ALBUMIN 3.0* 2.5*   No results for input(s): LIPASE, AMYLASE in the last 168 hours. No results for input(s): AMMONIA in the last 168 hours.  Coagulation Profile: Recent Labs  Lab 01/24/20 0049  INR 1.1    Cardiac Enzymes: Recent Labs  Lab 01/24/20 0619  CKTOTAL 129    BNP (last 3 results) No  results for input(s): PROBNP in the last 8760 hours.  Lipid Profile: No results for input(s): CHOL, HDL, LDLCALC, TRIG, CHOLHDL, LDLDIRECT in the last 72 hours.  Thyroid Function Tests: No results for input(s): TSH, T4TOTAL, FREET4, T3FREE, THYROIDAB in the last 72 hours.  Anemia Panel: Recent Labs    01/29/20 0158  VITAMINB12 1,337*  FERRITIN 1,101*  TIBC 227*  IRON 62    Urine analysis:    Component Value Date/Time   COLORURINE YELLOW 01/24/2020 0306   APPEARANCEUR CLEAR 01/24/2020 0306   LABSPEC 1.015 01/24/2020 0306   PHURINE 5.0 01/24/2020 0306   GLUCOSEU NEGATIVE 01/24/2020 0306   HGBUR NEGATIVE 01/24/2020 0306   BILIRUBINUR NEGATIVE 01/24/2020 0306   KETONESUR NEGATIVE 01/24/2020 0306   PROTEINUR NEGATIVE 01/24/2020 0306   NITRITE NEGATIVE 01/24/2020 0306   LEUKOCYTESUR NEGATIVE 01/24/2020 0306    Sepsis Labs: Lactic Acid, Venous    Component Value Date/Time   LATICACIDVEN 2.0 ([place]) 01/24/2020 0833    MICROBIOLOGY: Recent Results (from the past 240 hour(s))  Culture, blood (routine x 2)     Status: None   Collection Time: 01/23/20 11:45 PM   Specimen: BLOOD  Result Value Ref Range Status   Specimen Description BLOOD RIGHT ANTECUBITAL  Final   Special Requests   Final    BOTTLES DRAWN AEROBIC AND ANAEROBIC Blood Culture adequate volume   Culture   Final    NO GROWTH 5 DAYS Performed at [facility], [place] [address], [place], [place] [address]    Report Status 01/29/2020 FINAL  Final  Culture, blood (routine x 2)     Status: None   Collection Time: 01/23/20 11:47 PM   Specimen: BLOOD  Result Value Ref Range Status   Specimen  Description BLOOD LEFT ANTECUBITAL  Final   Special Requests   Final    BOTTLES DRAWN AEROBIC AND ANAEROBIC Blood Culture adequate volume   Culture   Final    NO GROWTH 5 DAYS Performed at [facility], [address], [place], [place] [address]    Report Status 01/29/2020 FINAL  Final  Urine culture     Status:  None   Collection Time: 01/24/20  2:58 AM   Specimen: Urine, Random  Result Value Ref Range Status   Specimen Description URINE, RANDOM  Final   Special Requests NONE  Final   Culture   Final    NO GROWTH Performed at [facility], [place] [address], [place], [place] [address]    Report Status 01/24/2020 FINAL  Final  Respiratory Panel by PCR     Status: None   Collection Time: 01/25/20 12:36 PM   Specimen: Nasopharyngeal Swab; Respiratory  Result Value Ref Range Status   Adenovirus NOT DETECTED NOT DETECTED Final   Coronavirus 229E NOT DETECTED NOT DETECTED Final    Comment: (NOTE) The Coronavirus on the Respiratory Panel, DOES NOT test for the novel  Coronavirus (2019 nCoV)    Coronavirus HKU1 NOT DETECTED NOT DETECTED Final   Coronavirus NL63 NOT DETECTED NOT DETECTED Final   Coronavirus OC43 NOT DETECTED NOT DETECTED Final   Metapneumovirus NOT DETECTED NOT DETECTED Final   Rhinovirus / Enterovirus NOT DETECTED NOT DETECTED Final   Influenza A NOT DETECTED NOT DETECTED Final   Influenza B NOT DETECTED NOT DETECTED Final   Parainfluenza Virus 1 NOT DETECTED NOT DETECTED Final   Parainfluenza Virus 2 NOT DETECTED NOT DETECTED Final   Parainfluenza Virus 3 NOT DETECTED NOT DETECTED Final   Parainfluenza Virus 4 NOT DETECTED NOT DETECTED Final   Respiratory Syncytial Virus NOT DETECTED NOT DETECTED Final   Bordetella pertussis NOT DETECTED NOT DETECTED Final   Chlamydophila pneumoniae NOT DETECTED NOT DETECTED Final   Mycoplasma pneumoniae NOT DETECTED NOT DETECTED Final    Comment: Performed at [facility], [place]. [address], [place], [place] [address]    RADIOLOGY STUDIES/RESULTS: DG Chest 2 View  Result Date: 01/30/2020 CLINICAL DATA:  Cough. EXAM: CHEST - 2 VIEW COMPARISON:  January 25, 2020. FINDINGS: Stable cardiomediastinal silhouette. No pneumothorax or pleural effusion is noted. Sternotomy wires are noted diffuse lung opacities are noted most consistent with  chronic interstitial lung disease, although superimposed edema or inflammation cannot be excluded. Bony thorax is unremarkable. IMPRESSION: Chronic interstitial lung disease, although superimposed edema or inflammation cannot be excluded. Electronically Signed   By: Marijo Conception M.D.   On: 01/30/2020 08:24     LOS: 6 days   Oren Binet, MD  [facility]    To contact the attending provider between 7A-7P or the covering provider during after hours 7P-7A, please log into the web site [email or web address] and access using universal [place] password for that web site. If you do not have the password, please call the hospital operator.  01/30/2020, 11:44 AM

## 2020-01-30 NOTE — Progress Notes (Signed)
NAME:  Robert Little, MRN:  [id number], DOB:  [birth year], LOS: 6 ADMISSION DATE:  01/23/2020, CONSULTATION DATE:  7/24 REFERRING MD:  Reesa Chew, CHIEF COMPLAINT:  Dyspnea   Brief History   79 yo male former smoker presented with progressive dyspnea and hypoxia.  Has hx of pulmonary fibrosis with UIP complicated by COVID 19 infection in May 2021.  Also has aspiration pneumonitis.  PCCM asked to assist with respiratory management.  Past Medical History  Schizoaffective schizophrenia Hypertension GERD Diabetes mellitus Coronary artery disease COVID-19 pneumonia and May 2021 CKD stage IV Chronic respiratory failure with hypoxemia on 2 L nasal cannula since March 2021 Atrial fibrillation diagnosed while hospitalized in May 2021 for COVID-19 Dysphagia with silent aspiration seen in May 2021 modified barium swallow  Significant Hospital Events   7/21 Admit 7/24 start steroids  Consults:    Procedures:    Significant Diagnostic Tests:   Echo 7/22 >> EF 60 to 65%, grade 2 DD, mild MR  V/Q scan 7/23 >> very low probability for PE  CT chest 7/24 >> atherosclerosis, widespread GGO/septal thickening/traction BTX, some areas of honeycombing b/l  Micro Data:  Blood 7/21 >>  RVP 7/23 >> negative  Antimicrobials:  7/21 cefepime x1 7/21 doxycycline x1 7/21 vancomycin x1  Interim history/subjective:   Denies chest pain or dyspnea. Sitting in a chair, saturation 98% on 3 L nasal cannula  Objective   Blood pressure (!) 145/55, pulse 56, temperature 98.6 F (37 C), resp. rate 18, height 5\' 8"  (1.727 m), weight 66 kg, SpO2 92 %.        Intake/Output Summary (Last 24 hours) at 01/30/2020 1641 Last data filed at 01/30/2020 0630 Gross per 24 hour  Intake --  Output 550 ml  Net -550 ml   Filed Weights   01/26/20 0657 01/27/20 0626 01/29/20 0328  Weight: 73 kg 66.2 kg 66 kg    Examination:  General - alert Eyes - pupils reactive ENT - no sinus tenderness, no stridor, poor  dentition Cardiac - regular rate/rhythm, no murmur Chest -bibasal dry  Velcro crackles , no accessory muscle use Abdomen - soft, non tender, + bowel sounds Extremities - no cyanosis, clubbing, or edema Skin - no rashes Neuro - normal strength, moves extremities, follows commands Psych - normal mood and behavior , answers appropriately to simple questions but wanders off topic  Chest x-ray 7/20 personally reviewed which shows chronic ILD  Resolved Hospital Problem list     Assessment & Plan:   Acute on chronic hypoxic respiratory failure. -UIP/IPF -  COVID PNA in May 2021 and more recently has been on amiodarone since May 2021 for A fib - concern that acute worsening might be related to amiodarone lung toxicity - amiodarone d/c'ed on 7/24 and pt started on solumedrol; since then has improved O2 needs - continue prednisone 40 mg daily , taper gradually by 10 mg every 5 days to off -We will arrange for outpatient follow-up in 4 to 6 weeks in pulmonary clinic  -Not a great candidate for antifibrotic therapy  Dysphagia with silent aspiration. - D3 diet  Emphysema. - continue breo, incruse  Acute on chronic diastolic CHF. Hx of CAD s/p CABG. Hx of a fib. - per primary team and cardiology  Labs    CMP Latest Ref Rng & Units 01/30/2020 01/29/2020 01/28/2020  Glucose 70 - 99 mg/dL 228(H) 270(H) 305(H)  BUN 8 - 23 mg/dL 73(H) 76(H) 66(H)  Creatinine 0.61 - 1.24 mg/dL 2.34(H) 2.65(H) 2.99(H)  Sodium 135 - 145 mmol/L 132(L) 134(L) 131(L)  Potassium 3.5 - 5.1 mmol/L 4.8 4.8 4.9  Chloride 98 - 111 mmol/L 97(L) 98 95(L)  CO2 22 - 32 mmol/L 26 26 25   Calcium 8.9 - 10.3 mg/dL 9.5 9.2 9.1  Total Protein 6.5 - 8.1 g/dL - - -  Total Bilirubin 0.3 - 1.2 mg/dL - - -  Alkaline Phos 38 - 126 U/L - - -  AST 15 - 41 U/L - - -  ALT 0 - 44 U/L - - -    CBC Latest Ref Rng & Units 01/30/2020 01/29/2020 01/29/2020  WBC 4.0 - 10.5 K/uL 12.4(H) 15.0(H) -  Hemoglobin 13.0 - 17.0 g/dL 8.6(L) 8.0(L) -    Hematocrit 39 - 52 % 28.0(L) 25.8(L) 26.2(L)  Platelets 150 - 400 K/uL 301 301 -    CBG (last 3)  Recent Labs    01/30/20 0634 01/30/20 0803 01/30/20 1133  GLUCAP 142* 148* 353*    PCCM available as needed  Signature:  Kara Mead MD. [place]. [facility]  If no response to pager , please call [phone]   01/30/2020

## 2020-01-30 NOTE — Progress Notes (Signed)
Progress Note  Patient Name: Robert Little Date of Encounter: 01/30/2020  [facility] Cardiologist: Sanda Klein, MD   Subjective   Breathing well. Weight stable at 145 lb. In/out net neutral last 48h.  Inpatient Medications    Scheduled Meds: . aspirin EC  81 mg Oral Daily  . atorvastatin  20 mg Oral QHS  . clonazePAM  0.5 mg Oral BID  . feeding supplement (ENSURE ENLIVE)  237 mL Oral BID BM  . ferrous sulfate  325 mg Oral BID WC  . fluticasone furoate-vilanterol  1 puff Inhalation Daily  . guaiFENesin  600 mg Oral BID  . heparin  5,000 Units Subcutaneous Q8H  . insulin aspart  0-5 Units Subcutaneous QHS  . insulin aspart  0-6 Units Subcutaneous TID WC  . insulin glargine  10 Units Subcutaneous Daily  . magnesium oxide  400 mg Oral Daily  . multivitamin with minerals  1 tablet Oral Daily  . pantoprazole  40 mg Oral BID AC  . predniSONE  40 mg Oral Q breakfast  . QUEtiapine  25 mg Oral QHS  . senna-docusate  2 tablet Oral QHS  . tamsulosin  0.8 mg Oral QPC supper  . umeclidinium bromide  1 puff Inhalation Daily  . vitamin B-12  500 mcg Oral BID   Continuous Infusions:  PRN Meds: acetaminophen, albuterol, ipratropium-albuterol, ondansetron **OR** ondansetron (ZOFRAN) IV, polyethylene glycol   Vital Signs    Vitals:   01/29/20 2031 01/30/20 0400 01/30/20 0804 01/30/20 0827  BP: (!) 130/72 (!) 138/56 (!) 145/55   Pulse: 72 57 56   Resp: 20 18 18    Temp: 98.2 F (36.8 C) 97.9 F (36.6 C) 98.6 F (37 C)   TempSrc:  Oral    SpO2: 91% 100% 100% 92%  Weight:      Height:        Intake/Output Summary (Last 24 hours) at 01/30/2020 0958 Last data filed at 01/30/2020 0630 Gross per 24 hour  Intake --  Output 550 ml  Net -550 ml   Last 3 Weights 01/29/2020 01/27/2020 01/26/2020  Weight (lbs) 145 lb 8.1 oz 145 lb 15.1 oz 160 lb 15 oz  Weight (kg) 66 kg 66.2 kg 73 kg      Telemetry    NSR - Personally Reviewed  ECG    No new tracing - Personally  Reviewed  Physical Exam  Alert, comfortable GEN: No acute distress.   Neck: No JVD Cardiac: RRR, no murmurs, rubs, or gallops.  Respiratory: Clear to auscultation bilaterally. GI: Soft, nontender, non-distended  MS: No edema; No deformity. Neuro:  Nonfocal  Psych: Normal affect   Labs    High Sensitivity Troponin:   Recent Labs  Lab 01/24/20 0619 01/24/20 0833 01/24/20 1553 01/24/20 1752 01/24/20 2245  TROPONINIHS 1,831* 1,903* 785* 844* 941*      Chemistry Recent Labs  Lab 01/23/20 2343 01/23/20 2343 01/24/20 0619 01/25/20 0728 01/28/20 0157 01/29/20 0158 01/30/20 0157  NA 139   < > 140   < > 131* 134* 132*  K 4.5   < > 4.8   < > 4.9 4.8 4.8  CL 103   < > 105   < > 95* 98 97*  CO2 24   < > 23   < > 25 26 26   GLUCOSE 219*   < > 121*   < > 305* 270* 228*  BUN 26*   < > 25*   < > 66* 76* 73*  CREATININE  2.51*   < > 2.47*   < > 2.99* 2.65* 2.34*  CALCIUM 9.0   < > 8.4*   < > 9.1 9.2 9.5  PROT 7.7  --  6.0*  --   --   --   --   ALBUMIN 3.0*  --  2.5*  --   --   --   --   AST 14*  --  15  --   --   --   --   ALT 11  --  9  --   --   --   --   ALKPHOS 86  --  67  --   --   --   --   BILITOT 0.7  --  1.0  --   --   --   --   GFRNONAA 24*   < > 24*   < > 19* 22* 26*  GFRAA 28*   < > 28*   < > 22* 26* 30*  ANIONGAP 12   < > 12   < > 11 10 9    < > = values in this interval not displayed.     Hematology Recent Labs  Lab 01/28/20 0157 01/29/20 0158 01/30/20 0157  WBC 13.8* 15.0* 12.4*  RBC 2.77* 2.97* 3.22*  HGB 7.7* 8.0* 8.6*  HCT 24.4* 25.8* 28.0*  MCV 88.1 86.9 87.0  MCH 27.8 26.9 26.7  MCHC 31.6 31.0 30.7  RDW 16.8* 16.8* 16.6*  PLT 253 301 301    BNP Recent Labs  Lab 01/23/20 2343 01/25/20 0733  BNP 836.2* 549.0*     DDimer  Recent Labs  Lab 01/25/20 0746  DDIMER 2.12*     Radiology    DG Chest 2 View  Result Date: 01/30/2020 CLINICAL DATA:  Cough. EXAM: CHEST - 2 VIEW COMPARISON:  January 25, 2020. FINDINGS: Stable cardiomediastinal  silhouette. No pneumothorax or pleural effusion is noted. Sternotomy wires are noted diffuse lung opacities are noted most consistent with chronic interstitial lung disease, although superimposed edema or inflammation cannot be excluded. Bony thorax is unremarkable. IMPRESSION: Chronic interstitial lung disease, although superimposed edema or inflammation cannot be excluded. Electronically Signed   By: Marijo Conception M.D.   On: 01/30/2020 08:24    Cardiac Studies   2D echo 01/24/20  1. Left ventricular ejection fraction, by estimation, is 60 to 65%. The  left ventricle has normal function. The left ventricle has no regional  wall motion abnormalities. There is mild left ventricular hypertrophy.  Left ventricular diastolic parameters  are consistent with Grade II diastolic dysfunction (pseudonormalization).  Elevated left atrial pressure.  2. Right ventricular systolic function is normal. The right ventricular  size is mildly enlarged. Tricuspid regurgitation signal is inadequate for  assessing PA pressure.  3. The mitral valve is normal in structure. Mild mitral valve  regurgitation.  4. The aortic valve is tricuspid. Aortic valve regurgitation is not  visualized. Mild to moderate aortic valve sclerosis/calcification is  present, without any evidence of aortic stenosis.   Patient Profile     79 y.o. male with a hx of CAD s/p prior CABG([facility] 02/2004),DM2,HTN, HLD, pulmonary fibrosiswith chronic respiratory failure onhome O2,Covid-19 PNA 11/2019,schizoaffective disorderin a group home, CKD IV, anemia of chronic disease,GERD, chronic diastolic CHF, lung nodule, extensive tobacco history, dysphagia, chronic anemiahad mild demand myocardial injury and acute exacerbation of diastolic HF during pneumonia with acute on chronic respiratory failure  Assessment & Plan     [facility] will  sign off.   Medication Recommendations:  Amiodarone stopped (AFib occurred during COVID-19  pneumonia; antiarrhythmic risk exceeds benefits) Continue ASA and atorvastatin.  Not on chronic anticoagulation due to fall risk. Not on beta blocker due to lung disease. Does not appear to need daily diuretic. Other recommendations (labs, testing, etc):  Low sodium diet. Monitor for fluid gain by daily weights after DC. Call office if weight > 150 lb Follow up as an outpatient:  Will schedule in 1 month  For questions or updates, please contact [place] Please consult [email or web address] for contact info under        Signed, Sanda Klein, MD  01/30/2020, 9:58 AM

## 2020-01-30 NOTE — Progress Notes (Signed)
Pt dinner CGB was 441 MD S. Ghimire was notified new orders were given to give 20 unit.

## 2020-01-30 NOTE — Progress Notes (Signed)
Nutrition Follow Up  DOCUMENTATION CODES:   Non-severe (moderate) malnutrition in context of chronic illness  INTERVENTION:    Continue MVI with minerals daily  Continue Ensure Enlive po BID, each supplement provides 350 kcal and 20 grams of protein  NUTRITION DIAGNOSIS:   Moderate Malnutrition related to chronic illness (CHF, CKD stage IV) as evidenced by mild fat depletion, moderate fat depletion, mild muscle depletion, percent weight loss (10.5% weight loss in less than 3 months).  Ongoing  GOAL:   Patient will meet greater than or equal to 90% of their needs   Progressing  MONITOR:   PO intake, Supplement acceptance, Labs, Weight trends, Skin, I & O's  REASON FOR ASSESSMENT:   Malnutrition Screening Tool    ASSESSMENT:   Robert Little is a 79 y.o. male with history of pulmonary fibrosis, paroxysmal atrial fibrillation, diastolic CHF, chronic kidney disease stage IV, chronic anemia who was admitted 2 months ago for Covid pneumonia with sepsis was brought to the ER from the skilled nursing facility after patient was getting more short of breath.   Pt endorses appetite is progressing. Last three meal completions charted as 50-75%. Taking Ensure twice daily. Does not wish to have other supplements. Continue interventions.   Awaiting SNF placement.   Admission weight: 62.6 kg  Current weight: 66 kg  Medications: SS novolog, lantus, mag-ox, MVI with minerals, prednisone, senokot, Vit B12 Labs: Na 132 (L) CBG 142-352  Diet Order:   Diet Order            DIET DYS 3 Room service appropriate? Yes; Fluid consistency: Thin  Diet effective now                 EDUCATION NEEDS:   Not appropriate for education at this time  Skin:  Skin Assessment: Skin Integrity Issues: Stage I: buttocks  Last BM:  7/26  Height:   Ht Readings from Last 1 Encounters:  01/24/20 5\' 8"  (1.727 m)    Weight:   Wt Readings from Last 1 Encounters:  01/29/20 66 kg    Ideal  Body Weight:  70 kg  BMI:  Body mass index is 22.12 kg/m.  Estimated Nutritional Needs:   Kcal:  0211-1552  Protein:  80-95 grams  Fluid:  1.5 L/day  Mariana Single RD, LDN Clinical Nutrition Pager listed in [place]

## 2020-01-31 DIAGNOSIS — R778 Other specified abnormalities of plasma proteins: Secondary | ICD-10-CM | POA: Diagnosis not present

## 2020-01-31 DIAGNOSIS — I5033 Acute on chronic diastolic (congestive) heart failure: Secondary | ICD-10-CM | POA: Diagnosis not present

## 2020-01-31 DIAGNOSIS — N184 Chronic kidney disease, stage 4 (severe): Secondary | ICD-10-CM | POA: Diagnosis not present

## 2020-01-31 DIAGNOSIS — J9621 Acute and chronic respiratory failure with hypoxia: Secondary | ICD-10-CM | POA: Diagnosis not present

## 2020-01-31 LAB — CBC
HCT: 26.3 % — ABNORMAL LOW (ref 39.0–52.0)
Hemoglobin: 8.1 g/dL — ABNORMAL LOW (ref 13.0–17.0)
MCH: 26.8 pg (ref 26.0–34.0)
MCHC: 30.8 g/dL (ref 30.0–36.0)
MCV: 87.1 fL (ref 80.0–100.0)
Platelets: 298 10*3/uL (ref 150–400)
RBC: 3.02 MIL/uL — ABNORMAL LOW (ref 4.22–5.81)
RDW: 16.9 % — ABNORMAL HIGH (ref 11.5–15.5)
WBC: 15.3 10*3/uL — ABNORMAL HIGH (ref 4.0–10.5)
nRBC: 0.1 % (ref 0.0–0.2)

## 2020-01-31 LAB — BASIC METABOLIC PANEL
Anion gap: 9 (ref 5–15)
BUN: 70 mg/dL — ABNORMAL HIGH (ref 8–23)
CO2: 26 mmol/L (ref 22–32)
Calcium: 9.4 mg/dL (ref 8.9–10.3)
Chloride: 100 mmol/L (ref 98–111)
Creatinine, Ser: 2.58 mg/dL — ABNORMAL HIGH (ref 0.61–1.24)
GFR calc Af Amer: 27 mL/min — ABNORMAL LOW (ref >60)
GFR calc non Af Amer: 23 mL/min — ABNORMAL LOW (ref >60)
Glucose, Bld: 156 mg/dL — ABNORMAL HIGH (ref 70–99)
Potassium: 4.8 mmol/L (ref 3.5–5.1)
Sodium: 135 mmol/L (ref 135–145)

## 2020-01-31 LAB — GLUCOSE, CAPILLARY
Glucose-Capillary: 164 mg/dL — ABNORMAL HIGH (ref 70–99)
Glucose-Capillary: 310 mg/dL — ABNORMAL HIGH (ref 70–99)
Glucose-Capillary: 278 mg/dL — ABNORMAL HIGH (ref 70–99)

## 2020-01-31 LAB — MAGNESIUM: Magnesium: 1.8 mg/dL (ref 1.7–2.4)

## 2020-01-31 LAB — HEMOGLOBIN A1C
Hgb A1c MFr Bld: 7 % — ABNORMAL HIGH (ref 4.8–5.6)
Mean Plasma Glucose: 154.2 mg/dL

## 2020-01-31 LAB — SARS CORONAVIRUS 2 BY RT PCR (HOSPITAL ORDER, PERFORMED IN ~~LOC~~ HOSPITAL LAB): SARS Coronavirus 2: NEGATIVE

## 2020-01-31 MED ORDER — CLONAZEPAM 0.5 MG PO TABS: 0.5000 mg | ORAL_TABLET | Freq: Two times a day (BID) | ORAL | 0 refills | Status: DC

## 2020-01-31 MED ORDER — PREDNISONE 20 MG PO TABS: 40.0000 mg | ORAL_TABLET | Freq: Every day | ORAL | Status: DC

## 2020-01-31 MED ORDER — INSULIN ASPART 100 UNIT/ML ~~LOC~~ SOLN: SUBCUTANEOUS | 11 refills | Status: AC

## 2020-01-31 MED ORDER — INSULIN GLARGINE 100 UNIT/ML ~~LOC~~ SOLN: 16.0000 [IU] | Freq: Every day | SUBCUTANEOUS | 11 refills | Status: DC

## 2020-01-31 MED ORDER — INSULIN ASPART 100 UNIT/ML ~~LOC~~ SOLN
0.0000 [IU] | Freq: Three times a day (TID) | SUBCUTANEOUS | Status: DC
  Administered 2020-01-31: 11 [IU] via SUBCUTANEOUS
  Administered 2020-01-31: 8 [IU] via SUBCUTANEOUS

## 2020-01-31 MED ORDER — INSULIN GLARGINE 100 UNIT/ML ~~LOC~~ SOLN
16.0000 [IU] | Freq: Every day | SUBCUTANEOUS | Status: DC
  Administered 2020-01-31: 16 [IU] via SUBCUTANEOUS
  Filled 2020-01-31: qty 0.16

## 2020-01-31 MED ORDER — INSULIN ASPART 100 UNIT/ML ~~LOC~~ SOLN
4.0000 [IU] | Freq: Three times a day (TID) | SUBCUTANEOUS | Status: DC
  Administered 2020-01-31 (×2): 4 [IU] via SUBCUTANEOUS

## 2020-01-31 NOTE — TOC Progression Note (Signed)
Transition of Care [facility]) - Progression Note    Patient Details  Name: Robert Little MRN: [id number] Date of Birth: 12-15-1940  Transition of Care [facility]) CM/SW [place], RN Phone Number: 01/31/2020, 11:15 AM  Clinical Narrative  Spoke to Mr. Robert Little about acceptances to SNF he chose [facility]. [facility] in admission, they have a bed and have accepted in [place]. Dr Lolita Patella do his discharge and will get a bed at [facility].    Expected Discharge Plan: Skilled Nursing Facility Barriers to Discharge: Continued Medical Work up  Expected Discharge Plan and Services Expected Discharge Plan: [place] In-house Referral: Clinical Social Work Discharge Planning Services: CM Consult Post Acute Care Choice: [place] arrangements for the past 2 months: [place]                                       Social Determinants of Health (SDOH) Interventions    Readmission Risk Interventions Readmission Risk Prevention Plan 11/23/2019 09/20/2019  Transportation Screening Complete Complete  PCP or Specialist Appt within 3-5 Days - Complete  HRI or [place] - Complete  Social Work Consult for [place] Planning/Counseling - Complete  Palliative Care Screening - Not Applicable  Medication Review Press photographer) Complete Complete  PCP or Specialist appointment within 3-5 days of discharge Complete -  [place] or Home Care Consult Complete -  SW Recovery Care/Counseling Consult Complete -  Palliative Care Screening Not Applicable -  [place] Complete -

## 2020-01-31 NOTE — Discharge Instructions (Signed)
Community-Acquired Pneumonia, Adult Pneumonia is an infection of the lungs. It causes swelling in the airways of the lungs. Mucus and fluid may also build up inside the airways. One type of pneumonia can happen while a person is in a hospital. A different type can happen when a person is not in a hospital (community-acquired pneumonia).  What are the causes?  This condition is caused by germs (viruses, bacteria, or fungi). Some types of germs can be passed from one person to another. This can happen when you breathe in droplets from the cough or sneeze of an infected person. What increases the risk? You are more likely to develop this condition if you:  Have a long-term (chronic) disease, such as: ? Chronic obstructive pulmonary disease (COPD). ? Asthma. ? Cystic fibrosis. ? Congestive heart failure. ? Diabetes. ? Kidney disease.  Have HIV.  Have sickle cell disease.  Have had your spleen removed.  Do not take good care of your teeth and mouth (poor dental hygiene).  Have a medical condition that increases the risk of breathing in droplets from your own mouth and nose.  Have a weakened body defense system (immune system).  Are a smoker.  Travel to areas where the germs that cause this illness are common.  Are around certain animals or the places they live. What are the signs or symptoms?  A dry cough.  A wet (productive) cough.  Fever.  Sweating.  Chest pain. This often happens when breathing deeply or coughing.  Fast breathing or trouble breathing.  Shortness of breath.  Shaking chills.  Feeling tired (fatigue).  Muscle aches. How is this treated? Treatment for this condition depends on many things. Most adults can be treated at home. In some cases, treatment must happen in a hospital. Treatment may include:  Medicines given by mouth or through an IV tube.  Being given extra oxygen.  Respiratory therapy. In rare cases, treatment for very bad pneumonia  may include:  Using a machine to help you breathe.  Having a procedure to remove fluid from around your lungs. Follow these instructions at home: Medicines  Take over-the-counter and prescription medicines only as told by your doctor. ? Only take cough medicine if you are losing sleep.  If you were prescribed an antibiotic medicine, take it as told by your doctor. Do not stop taking the antibiotic even if you start to feel better. General instructions   Sleep with your head and neck raised (elevated). You can do this by sleeping in a recliner or by putting a few pillows under your head.  Rest as needed. Get at least 8 hours of sleep each night.  Drink enough water to keep your pee (urine) pale yellow.  Eat a healthy diet that includes plenty of vegetables, fruits, whole grains, low-fat dairy products, and lean protein.  Do not use any products that contain nicotine or tobacco. These include cigarettes, e-cigarettes, and chewing tobacco. If you need help quitting, ask your doctor.  Keep all follow-up visits as told by your doctor. This is important. How is this prevented? A shot (vaccine) can help prevent pneumonia. Shots are often suggested for:  People older than 78 years of age.  People older than 78 years of age who: ? Are having cancer treatment. ? Have long-term (chronic) lung disease. ? Have problems with their body's defense system. You may also prevent pneumonia if you take these actions:  Get the flu (influenza) shot every year.  Go to the dentist as   often as told.  Wash your hands often. If you cannot use soap and water, use hand sanitizer. Contact a doctor if:  You have a fever.  You lose sleep because your cough medicine does not help. Get help right away if:  You are short of breath and it gets worse.  You have more chest pain.  Your sickness gets worse. This is very serious if: ? You are an older adult. ? Your body's defense system is weak.  You  cough up blood. Summary  Pneumonia is an infection of the lungs.  Most adults can be treated at home. Some will need treatment in a hospital.  Drink enough water to keep your pee pale yellow.  Get at least 8 hours of sleep each night. This information is not intended to replace advice given to you by your health care provider. Make sure you discuss any questions you have with your health care provider. Document Revised: 10/11/2018 Document Reviewed: 02/16/2018 Elsevier Patient Education  2020 [employer]  

## 2020-01-31 NOTE — TOC Transition Note (Signed)
Transition of Care [facility]) - CM/SW Discharge Note   Patient Details  Name: Robert Little MRN: [id number] Date of Birth: [birth year]  [facility] CM/SW Contact:  Verdell Carmine, RN Phone Number: 01/31/2020, 9:48 AM   Clinical Narrative:     [facility] patient at this time. They are declining in the HUB due to [place] 2 PASSR and cannot provide long tern care.    Barriers to Discharge: Continued Medical Work up   Patient Goals and CMS Choice   CMS Medicare.gov Compare Post Acute Care list provided to:: Patient Choice offered to / list presented to : Patient  Discharge Placement                       Discharge Plan and Services In-house Referral: Clinical Social Work Discharge Planning Services: CM Consult Post Acute Care Choice: Skilled Nursing Facility                               Social Determinants of Health (SDOH) Interventions     Readmission Risk Interventions Readmission Risk Prevention Plan 11/23/2019 09/20/2019  Transportation Screening Complete Complete  PCP or Specialist Appt within 3-5 Days - Complete  HRI or [place] - Complete  Social Work Consult for [place] Planning/Counseling - Complete  Palliative Care Screening - Not Applicable  Medication Review Press photographer) Complete Complete  PCP or Specialist appointment within 3-5 days of discharge Complete -  [place] or Home Care Consult Complete -  SW Recovery Care/Counseling Consult Complete -  Palliative Care Screening Not Applicable -  [place] Complete -

## 2020-01-31 NOTE — Discharge Summary (Signed)
PATIENT DETAILS Name: Robert Little Age: 79 y.o. Sex: male Date of Birth: Feb 05, 1941 MRN: [id number]. Admitting Physician: Rise Patience, MD DDU:[facility], [name]  Admit Date: 01/23/2020 Discharge date: 01/31/2020  Recommendations for Outpatient Follow-up:  1. Follow up with PCP in 1-2 weeks 2. Please obtain CMP/CBC in one week 3. Please ensure follow-up with pulmonology, cardiology. 4. Continue to optimize insulin dosing as prednisone is slowly tapered down. 5. Taper prednisone gradually by 10 mg every 5 days until discontinued.  Admitted From:  SNF  Disposition: SNF   Home Health: No  Equipment/Devices: None  Discharge Condition: Stable  CODE STATUS: FULL CODE  Diet recommendation:  Diet Order            Diet - low sodium heart healthy           DIET DYS 3 Room service appropriate? Yes; Fluid consistency: Thin  Diet effective now                  Brief Narrative: Patient is a 79 y.o. male with history of pulmonary fibrosis, PAF, chronic diastolic heart failure, CKD stage IV, anemia of chronic disease-presented with shortness of breath-found to have acute hypoxic respiratory failure-thought to be from amiodarone induced lung toxicity.  See below for further details.  Significant events: 7/21>> admit for hypoxia-thought to be from amiodarone induced lung toxicity.  Significant studies: 7/24>> CT chest:atherosclerosis, widespread GGO/septal thickening/traction BTX, some areas of honeycombing b/l 7/23>> VQ scan: Low probability 7/23>> lower extremity Doppler: Negative for DVT 7/22>> Echo: EF 42-70%, grade 2 diastolic dysfunction.  Antimicrobial therapy: None  Microbiology data: 7/21>> blood culture: No growth 7/22>> urine culture: No growth  Procedures : None  Consults: Cardiology, PCCM  Brief Hospital Course: Acute on chronic hypoxic respiratory failure: Thought to be secondary to amiodarone induced lung toxicity-.   With steroids-amiodarone on hold-stable on 2-3 L of oxygen currently.  Recommendations by pulmonary is to taper prednisone gradually by 10 mg every 5 days until discontinued.  Minimally elevated troponins: Demand ischemia-not consistent with ACS-echo with stable findings.  No further work-up planned.  Chronic diastolic heart failure: Euvolemic-no signs of volume overload.  PAF: Rate controlled-not on anticoagulation due to fall risk.  DM-2 (A1c 7.0 on 09/20/2019): CBG stable-continue Lantus and SSI.    Continue to optimize dosing as steroids are tapered down.  CKD stage IV: Creatinine at baseline-follow periodically  Normocytic anemia: Hemoglobin stable-no indication for transfusion-no obvious GI bleeding apparent.  GERD: Continue PPI   History of COPD/pulmonary fibrosis with chronic hypoxia on 2 L of oxygen at all times: Continue bronchodilators/incentive spirometry/flutter valve-on steroids for amiodarone induced lung toxicity.  History of schizoaffective disorder: Continue Seroquel  BPH: Continue Flomax  Nutrition Problem: Nutrition Problem: Moderate Malnutrition Etiology: chronic illness (CHF, CKD stage IV) Signs/Symptoms: mild fat depletion, moderate fat depletion, mild muscle depletion, percent weight loss (10.5% weight loss in less than 3 months) Percent weight loss: 10.5 % (less than 3 months) Interventions: Ensure Enlive (each supplement provides 350kcal and 20 grams of protein), MVI   RN pressure injury documentation: Pressure Injury 11/27/19 Ear Left Stage 2 -  Partial thickness loss of dermis presenting as a shallow open injury with a red, pink wound bed without slough. Red/pink (Active)  11/27/19 1350  Location: Ear  Location Orientation: Left  Staging: Stage 2 -  Partial thickness loss of dermis presenting as a shallow open injury with a red, pink wound bed without slough.  Wound Description (Comments):  Red/pink  Present on Admission: No     Pressure  Injury 01/24/20 Buttocks Mid Stage 1 -  Intact skin with non-blanchable redness of a localized area usually over a bony prominence. (Active)  01/24/20 2300  Location: Buttocks  Location Orientation: Mid  Staging: Stage 1 -  Intact skin with non-blanchable redness of a localized area usually over a bony prominence.  Wound Description (Comments):   Present on Admission: Yes     Discharge Diagnoses:  Principal Problem:   Acute on chronic respiratory failure with hypoxia (HCC) Active Problems:   Essential hypertension   Pulmonary fibrosis (HCC)   Schizo affective schizophrenia ([place])   CKD (chronic kidney disease), stage IV (HCC)   Acute on chronic diastolic CHF (congestive heart failure) (HCC)   HCAP (healthcare-associated pneumonia)   Pressure injury of skin   Acute renal failure with acute renal cortical necrosis superimposed on stage 4 chronic kidney disease (HCC)   Malnutrition of moderate degree   Elevated troponin   Paroxysmal atrial fibrillation [facility])   Discharge Instructions:  Activity:  As tolerated with Full fall precautions use walker/cane & assistance as needed  Discharge Instructions    Call MD for:  difficulty breathing, headache or visual disturbances   Complete by: As directed    Diet - low sodium heart healthy   Complete by: As directed    Discharge instructions   Complete by: As directed    Follow with Primary MD  Care, [facility] in 1-2 weeks  Please get a complete blood count and chemistry panel checked by your Primary MD at your next visit, and again as instructed by your Primary MD.  Get Medicines reviewed and adjusted: Please take all your medications with you for your next visit with your Primary MD  Laboratory/radiological data: Please request your Primary MD to go over all hospital tests and procedure/radiological results at the follow up, please ask your Primary MD to get all Hospital records sent to his/her office.  In some  cases, they will be blood work, cultures and biopsy results pending at the time of your discharge. Please request that your primary care M.D. follows up on these results.  Also Note the following: If you experience worsening of your admission symptoms, develop shortness of breath, life threatening emergency, suicidal or homicidal thoughts you must seek medical attention immediately by calling 911 or calling your MD immediately  if symptoms less severe.  You must read complete instructions/literature along with all the possible adverse reactions/side effects for all the Medicines you take and that have been prescribed to you. Take any new Medicines after you have completely understood and accpet all the possible adverse reactions/side effects.   Do not drive when taking Pain medications or sleeping medications (Benzodaizepines)  Do not take more than prescribed Pain, Sleep and Anxiety Medications. It is not advisable to combine anxiety,sleep and pain medications without talking with your primary care practitioner  Special Instructions: If you have smoked or chewed Tobacco  in the last 2 yrs please stop smoking, stop any regular Alcohol  and or any Recreational drug use.  Wear Seat belts while driving.  Please note: You were cared for by a hospitalist during your hospital stay. Once you are discharged, your primary care physician will handle any further medical issues. Please note that NO REFILLS for any discharge medications will be authorized once you are discharged, as it is imperative that you return to your primary care physician (or establish a relationship  with a primary care physician if you do not have one) for your post hospital discharge needs so that they can reassess your need for medications and monitor your lab values.   Increase activity slowly   Complete by: As directed    No wound care   Complete by: As directed      Allergies as of 01/31/2020   No Known Allergies       Medication List    STOP taking these medications   amiodarone 100 MG tablet Commonly known as: PACERONE   amiodarone 200 MG tablet Commonly known as: PACERONE   carvedilol 3.125 MG tablet Commonly known as: COREG   cefTRIAXone  IVPB Commonly known as: ROCEPHIN   doxycycline 100 MG capsule Commonly known as: VIBRAMYCIN     TAKE these medications   acetaminophen 325 MG tablet Commonly known as: TYLENOL Take 2 tablets (650 mg total) by mouth every 6 (six) hours as needed for mild pain (or Fever >/= 101). What changed: reasons to take this   albuterol 108 (90 Base) MCG/ACT inhaler Commonly known as: VENTOLIN HFA Inhale 2 puffs into the lungs every 6 (six) hours as needed for wheezing or shortness of breath.   aspirin 81 MG EC tablet Take 1 tablet (81 mg total) by mouth daily.   atorvastatin 20 MG tablet Commonly known as: LIPITOR Take 20 mg by mouth at bedtime.   clonazePAM 0.5 MG tablet Commonly known as: KLONOPIN Take 1 tablet (0.5 mg total) by mouth 2 (two) times daily.   Eucerin Skin Calming Crea Apply topically in the morning and at bedtime. To legs, arms and back   feeding supplement (ENSURE ENLIVE) Liqd Take 237 mLs by mouth 2 (two) times daily between meals.   ferrous sulfate 325 (65 FE) MG tablet Take 1 tablet (325 mg total) by mouth 2 (two) times daily with a meal.   fluticasone furoate-vilanterol 200-25 MCG/INH Aepb Commonly known as: BREO ELLIPTA Inhale 1 puff into the lungs daily.   guaiFENesin 600 MG 12 hr tablet Commonly known as: MUCINEX Take 600 mg by mouth 2 (two) times daily.   insulin aspart 100 UNIT/ML injection Commonly known as: novoLOG 0-15 Units, Subcutaneous, 3 times daily with meals Correction coverage: Moderate (average weight, post-op) CBG < 70: Implement Hypoglycemia measures CBG 70 - 120: 0 units CBG 121 - 150: 2 units CBG 151 - 200: 3 units CBG 201 - 250: 5 units CBG 251 - 300: 8 units CBG 301 - 350: 11 units CBG 351 -  400: 15 units CBG > 400: call MD   insulin glargine 100 UNIT/ML injection Commonly known as: LANTUS Inject 0.16 mLs (16 Units total) into the skin daily. Start taking on: February 01, 2020   ipratropium 0.03 % nasal spray Commonly known as: ATROVENT Place 1 spray into the nose daily as needed for congestion.   ipratropium-albuterol 0.5-2.5 (3) MG/3ML Soln Commonly known as: DUONEB Take 3 mLs by nebulization every 8 (eight) hours.   magnesium oxide 400 MG tablet Commonly known as: MAG-OX Take 400 mg by mouth in the morning, at noon, and at bedtime.   multivitamin with minerals Tabs tablet Take 1 tablet by mouth daily.   pantoprazole 40 MG tablet Commonly known as: PROTONIX Take 1 tablet (40 mg total) by mouth daily.   polyethylene glycol powder 17 GM/SCOOP powder Commonly known as: MiraLax Take 17 g by mouth 2 (two) times daily as needed for moderate constipation.   predniSONE 20 MG tablet Commonly  known as: DELTASONE Take 2 tablets (40 mg total) by mouth daily with breakfast. Start taking on: February 01, 2020   QUEtiapine 25 MG tablet Commonly known as: SEROQUEL Take 1 tablet (25 mg total) by mouth at bedtime.   tamsulosin 0.4 MG Caps capsule Commonly known as: FLOMAX Take 1 capsule (0.4 mg total) by mouth daily after supper. What changed: how much to take   umeclidinium bromide 62.5 MCG/INH Aepb Commonly known as: INCRUSE ELLIPTA Inhale 1 puff into the lungs daily.   vitamin B-12 500 MCG tablet Commonly known as: CYANOCOBALAMIN Take 500 mcg by mouth 2 (two) times daily.       Follow-up Information    Marilynn Rail Jossie Ng, NP Follow up.   Specialty: Cardiology Why: [place] location - your follow-up appointment has been rescheduled from August to September 2nd, 2021 at 3:45 PM (Arrive by 3:30 PM). Denyse Amass is one of the nurse practitioners that works with our cardiology team. Contact information: [address] [place]  [address] [phone]        Care, [name]. Schedule an appointment as soon as possible for a visit in 1 week(s).   Specialty: Family Medicine Contact information: [address] [place] [address] [phone]        Sanda Klein, MD .   Specialty: Cardiology Contact information: [address] [place] [address] [phone]        Rigoberto Noel, MD. Schedule an appointment as soon as possible for a visit in 2 week(s).   Specialty: Pulmonary Disease Contact information: [place] [address] [phone]              No Known Allergies  Other Procedures/Studies: DG Chest 2 View  Result Date: 01/30/2020 CLINICAL DATA:  Cough. EXAM: CHEST - 2 VIEW COMPARISON:  January 25, 2020. FINDINGS: Stable cardiomediastinal silhouette. No pneumothorax or pleural effusion is noted. Sternotomy wires are noted diffuse lung opacities are noted most consistent with chronic interstitial lung disease, although superimposed edema or inflammation cannot be excluded. Bony thorax is unremarkable. IMPRESSION: Chronic interstitial lung disease, although superimposed edema or inflammation cannot be excluded. Electronically Signed   By: Marijo Conception M.D.   On: 01/30/2020 08:24   CT CHEST WO CONTRAST  Result Date: 01/26/2020 CLINICAL DATA:  79 year old male with history of extreme shortness of breath and cough. Evaluate for interstitial lung disease. EXAM: CT CHEST WITHOUT CONTRAST TECHNIQUE: Multidetector CT imaging of the chest was performed following the standard protocol without IV contrast. COMPARISON:  Chest CT 09/19/2019. FINDINGS: Cardiovascular: Heart size is normal. There is no significant pericardial fluid, thickening or pericardial calcification. There is aortic atherosclerosis, as well as atherosclerosis of the great vessels of the mediastinum and the coronary arteries, including calcified  atherosclerotic plaque in the left main, left anterior descending, left circumflex and right coronary arteries. Calcifications of the aortic valve. Mediastinum/Nodes: No pathologically enlarged mediastinal or hilar lymph nodes. Multiple prominent borderline enlarged mediastinal and hilar lymph nodes are noted, similar to the prior study, presumably chronic and reactive. Esophagus is unremarkable in appearance. No axillary lymphadenopathy. Lungs/Pleura: High-resolution imaging was not performed. With these limitations in mind there is again widespread ground-glass attenuation, septal thickening, thickening of the peribronchovascular interstitium, traction bronchiectasis and some areas of honeycombing in the lungs bilaterally. These findings have a craniocaudal gradient and appear minimally progressive compared to the prior study. No new  acute consolidative airspace disease. Trace amount of left pleural fluid and/or thickening, similar to the prior study. Upper Abdomen: Aortic atherosclerosis. Musculoskeletal: Median sternotomy wires. There are no aggressive appearing lytic or blastic lesions noted in the visualized portions of the skeleton. IMPRESSION: 1. The appearance of the lungs is compatible with interstitial lung disease, with a spectrum of findings considered diagnostic of usual interstitial pneumonia (UIP) per current ATS guidelines. Minimal progression compared to the prior study. 2. Aortic atherosclerosis, in addition to left main and 3 vessel coronary artery disease. Assessment for potential risk factor modification, dietary therapy or pharmacologic therapy may be warranted, if clinically indicated. 3. There are calcifications of the aortic valve. Echocardiographic correlation for evaluation of potential valvular dysfunction may be warranted if clinically indicated. 4. Additional incidental findings, as above. Aortic Atherosclerosis (ICD10-I70.0). Electronically Signed   By: Vinnie Langton M.D.   On:  01/26/2020 11:17   NM Pulmonary Perfusion  Result Date: 01/25/2020 CLINICAL DATA:  Suspected pulmonary embolism, positive D-dimer, pulmonary fibrosis and COVID pneumonia 2 months prior now with shortness of breath EXAM: NUCLEAR MEDICINE PERFUSION LUNG SCAN TECHNIQUE: Perfusion images were obtained in multiple projections after intravenous injection of radiopharmaceutical. Ventilation scans intentionally deferred if perfusion scan and chest x-ray adequate for interpretation during COVID 19 epidemic. RADIOPHARMACEUTICALS:  3.7 mCi Tc-43m MAA IV COMPARISON:  Radiograph 01/25/2020, CT imaging 09/19/2019. FINDINGS: Nonsegmental perfusion defects in the left and right lung appear to correspond to the areas of more pronounced fibrotic interstitial change seen on the comparison CT which remains present on today's radiographic examination corresponding with a mismatch defect on perfusion only imaging. No other discernible perfusion defects. IMPRESSION: Per modified PIOPED II perfusion only criteria, is the presence of the nonsegmental perfusion defects with CT/radiographic correlate should be interpreted as a normal exam to very low probability of pulmonary embolism. Electronically Signed   By: Lovena Le M.D.   On: 01/25/2020 18:12   DG Chest Port 1 View  Result Date: 01/25/2020 CLINICAL DATA:  Dyspnea. EXAM: PORTABLE CHEST 1 VIEW COMPARISON:  Prior chest radiograph 01/23/2020 and earlier, chest CT 11/19/2019 FINDINGS: Prior median sternotomy and CABG. Redemonstrated fractures within multiple sternotomy wires. Heart size at the upper limits of normal, unchanged. Similar appearance of extensive bilateral airspace opacities superimposed upon a background of known chronic interstitial lung disease. No evidence of pneumothorax. Trace bilateral pleural effusions are difficult to exclude. No acute bony abnormality identified IMPRESSION: No significant change as compared to the chest radiograph 01/23/2020. Redemonstrated  extensive airspace opacities superimposed upon a background of chronic interstitial lung disease. Trace bilateral pleural effusions are difficult to exclude. Electronically Signed   By: Kellie Simmering DO   On: 01/25/2020 08:01   DG Chest Port 1 View  Result Date: 01/23/2020 CLINICAL DATA:  Dyspnea EXAM: PORTABLE CHEST 1 VIEW COMPARISON:  11/20/2019, CT 09/19/2019 FINDINGS: Lung volumes are small, but are symmetric. Moderate mid and lower lung zone interstitial pulmonary infiltrate has developed,, significantly progressed since prior examination. Given the findings on prior CT examination, this may represent progression of the patient's underlying interstitial lung disease, or more likely, superimposed progressive pulmonary edema or atypical infection. Cavitary lesion within the right mid lung zone represents a pneumatocele noted on a prior CT exam in this region. No pneumothorax or pleural effusion. Coronary artery bypass grafting has been performed. Cardiac size is within normal limits. No acute bone abnormality. IMPRESSION: Interval development of extensive mid and lower lung zone pulmonary infiltrates superimposed upon axis ting chronic interstitial  changes. Of the various considerations listed above, superimposed pulmonary edema appears most likely. Pulmonary hypoinflation. Electronically Signed   By: Fidela Salisbury MD   On: 01/23/2020 23:24   ECHOCARDIOGRAM COMPLETE  Result Date: 01/24/2020    ECHOCARDIOGRAM REPORT   Patient Name:   Robert Little Date of Exam: 01/24/2020 Medical Rec #:  [id number]    Height:       68.0 in Accession #:    [id number]   Weight:       138.0 lb Date of Birth:  May 07, 1941    BSA:          1.746 m Patient Age:    59 years     BP:           115/47 mmHg Patient Gender: M            HR:           79 bpm. Exam Location:  Inpatient Procedure: 2D Echo and Intracardiac Opacification Agent Indications:    Elevated Troponin  History:        Patient has prior history of Echocardiogram  examinations, most                 recent 11/14/2019. CAD, Prior CABG; Risk Factors:Hypertension.                 Pulmonary fibrosis, paroxysmal atrial fibrillation, diastolic                 CHF, chronic kidney disease stage IV, chronic anemia. Covid                 pneumonia in may.  Sonographer:    Darlina Sicilian RDCS Referring Phys: [place]  1. Left ventricular ejection fraction, by estimation, is 60 to 65%. The left ventricle has normal function. The left ventricle has no regional wall motion abnormalities. There is mild left ventricular hypertrophy. Left ventricular diastolic parameters are consistent with Grade II diastolic dysfunction (pseudonormalization). Elevated left atrial pressure.  2. Right ventricular systolic function is normal. The right ventricular size is mildly enlarged. Tricuspid regurgitation signal is inadequate for assessing PA pressure.  3. The mitral valve is normal in structure. Mild mitral valve regurgitation.  4. The aortic valve is tricuspid. Aortic valve regurgitation is not visualized. Mild to moderate aortic valve sclerosis/calcification is present, without any evidence of aortic stenosis. FINDINGS  Left Ventricle: Left ventricular ejection fraction, by estimation, is 60 to 65%. The left ventricle has normal function. The left ventricle has no regional wall motion abnormalities. Definity contrast agent was given IV to delineate the left ventricular  endocardial borders. The left ventricular internal cavity size was normal in size. There is mild left ventricular hypertrophy. Left ventricular diastolic parameters are consistent with Grade II diastolic dysfunction (pseudonormalization). Elevated left atrial pressure. Right Ventricle: The right ventricular size is mildly enlarged. Right vetricular wall thickness was not assessed. Right ventricular systolic function is normal. Tricuspid regurgitation signal is inadequate for assessing PA pressure. Left Atrium:  Left atrial size was normal in size. Right Atrium: Right atrial size was not well visualized. Pericardium: Trivial pericardial effusion is present. Mitral Valve: The mitral valve is normal in structure. Mild mitral valve regurgitation. Tricuspid Valve: The tricuspid valve is normal in structure. Tricuspid valve regurgitation is mild. Aortic Valve: The aortic valve is tricuspid. Aortic valve regurgitation is not visualized. Mild to moderate aortic valve sclerosis/calcification is present, without any evidence of aortic stenosis. Pulmonic Valve: The pulmonic  valve was not well visualized. Pulmonic valve regurgitation is not visualized. Aorta: The aortic root is normal in size and structure. IAS/Shunts: The interatrial septum was not well visualized.  LEFT VENTRICLE PLAX 2D LVIDd:         4.00 cm     Diastology LVIDs:         2.90 cm     LV e' lateral:   6.53 cm/s LV PW:         1.10 cm     LV E/e' lateral: 15.9 LV IVS:        1.10 cm     LV e' medial:    5.77 cm/s LVOT diam:     1.60 cm     LV E/e' medial:  18.0 LV SV:         35 LV SV Index:   20 LVOT Area:     2.01 cm  LV Volumes (MOD) LV vol d, MOD A2C: 94.5 ml LV vol d, MOD A4C: 98.0 ml LV vol s, MOD A2C: 64.1 ml LV vol s, MOD A4C: 33.0 ml LV SV MOD A2C:     30.4 ml LV SV MOD A4C:     98.0 ml LV SV MOD BP:      54.2 ml RIGHT VENTRICLE RV S prime:     12.90 cm/s TAPSE (M-mode): 1.6 cm LEFT ATRIUM             Index LA diam:        4.40 cm 2.52 cm/m LA Vol (A2C):   45.0 ml 25.78 ml/m LA Vol (A4C):   56.8 ml 32.54 ml/m LA Biplane Vol: 54.6 ml 31.28 ml/m  AORTIC VALVE LVOT Vmax:   96.10 cm/s LVOT Vmean:  56.000 cm/s LVOT VTI:    0.173 m  AORTA Ao Root diam: 3.20 cm MITRAL VALVE MV Area (PHT): 4.49 cm     SHUNTS MV Decel Time: 169 msec     Systemic VTI:  0.17 m MV E velocity: 104.00 cm/s  Systemic Diam: 1.60 cm MV A velocity: 78.60 cm/s MV E/A ratio:  1.32 Oswaldo Milian MD Electronically signed by Oswaldo Milian MD Signature Date/Time:  01/24/2020/2:01:53 PM    Final    VAS [place] LOWER EXTREMITY VENOUS (DVT)  Result Date: 01/26/2020  Lower Venous DVTStudy Indications: SOB, and Swelling.  Comparison Study: 09-17-2019 LEV Bilateral- no DVT Performing Technologist: Darlin Coco  Examination Guidelines: A complete evaluation includes B-mode imaging, spectral Doppler, color Doppler, and power Doppler as needed of all accessible portions of each vessel. Bilateral testing is considered an integral part of a complete examination. Limited examinations for reoccurring indications may be performed as noted. The reflux portion of the exam is performed with the patient in reverse Trendelenburg.  +---------+---------------+---------+-----------+----------+--------------+ RIGHT    CompressibilityPhasicitySpontaneityPropertiesThrombus Aging +---------+---------------+---------+-----------+----------+--------------+ CFV      Full           Yes      Yes                                 +---------+---------------+---------+-----------+----------+--------------+ SFJ      Full                                                        +---------+---------------+---------+-----------+----------+--------------+  FV Prox  Full                                                        +---------+---------------+---------+-----------+----------+--------------+ FV Mid   Full                                                        +---------+---------------+---------+-----------+----------+--------------+ FV DistalFull                                                        +---------+---------------+---------+-----------+----------+--------------+ PFV      Full                                                        +---------+---------------+---------+-----------+----------+--------------+ POP      Full           Yes      Yes                                  +---------+---------------+---------+-----------+----------+--------------+ PTV      Full                                                        +---------+---------------+---------+-----------+----------+--------------+ PERO     Full                                                        +---------+---------------+---------+-----------+----------+--------------+   +---------+---------------+---------+-----------+----------+--------------+ LEFT     CompressibilityPhasicitySpontaneityPropertiesThrombus Aging +---------+---------------+---------+-----------+----------+--------------+ CFV      Full           Yes      Yes                                 +---------+---------------+---------+-----------+----------+--------------+ SFJ      Full                                                        +---------+---------------+---------+-----------+----------+--------------+ FV Prox  Full                                                        +---------+---------------+---------+-----------+----------+--------------+  FV Mid   Full                                                        +---------+---------------+---------+-----------+----------+--------------+ FV DistalFull                                                        +---------+---------------+---------+-----------+----------+--------------+ PFV      Full                                                        +---------+---------------+---------+-----------+----------+--------------+ POP      Full           Yes      Yes                                 +---------+---------------+---------+-----------+----------+--------------+ PTV      Full                                                        +---------+---------------+---------+-----------+----------+--------------+ PERO     Full                                                         +---------+---------------+---------+-----------+----------+--------------+     Summary: RIGHT: - There is no evidence of deep vein thrombosis in the lower extremity.  - No cystic structure found in the popliteal fossa.  LEFT: - There is no evidence of deep vein thrombosis in the lower extremity.  - No cystic structure found in the popliteal fossa.  *See table(s) above for measurements and observations. Electronically signed by Monica Martinez MD on 01/26/2020 at 11:54:49 AM.    Final      TODAY-DAY OF DISCHARGE:  Subjective:   [name] today has no headache,no chest abdominal pain,no new weakness tingling or numbness, feels much better wants to go home today.   Objective:   Blood pressure (!) 133/59, pulse 61, temperature 97.6 F (36.4 C), resp. rate 14, height 5\' 8"  (1.727 m), weight 66 kg, SpO2 95 %.  Intake/Output Summary (Last 24 hours) at 01/31/2020 1141 Last data filed at 01/31/2020 0603 Gross per 24 hour  Intake 720 ml  Output 1050 ml  Net -330 ml   Filed Weights   01/26/20 0657 01/27/20 0626 01/29/20 0328  Weight: 73 kg 66.2 kg 66 kg    Exam: Awake Alert, Oriented *3, No new F.N deficits, Normal affect [place].AT,PERRAL Supple Neck,No JVD, No cervical lymphadenopathy appriciated.  Symmetrical Chest wall movement, Good air movement bilaterally, CTAB RRR,No Gallops,Rubs or new Murmurs, No Parasternal Heave +ve B.Sounds, Abd  Soft, Non tender, No organomegaly appriciated, No rebound -guarding or rigidity. No Cyanosis, Clubbing or edema, No new Rash or bruise   PERTINENT RADIOLOGIC STUDIES: DG Chest 2 View  Result Date: 01/30/2020 CLINICAL DATA:  Cough. EXAM: CHEST - 2 VIEW COMPARISON:  January 25, 2020. FINDINGS: Stable cardiomediastinal silhouette. No pneumothorax or pleural effusion is noted. Sternotomy wires are noted diffuse lung opacities are noted most consistent with chronic interstitial lung disease, although superimposed edema or inflammation cannot be excluded. Bony  thorax is unremarkable. IMPRESSION: Chronic interstitial lung disease, although superimposed edema or inflammation cannot be excluded. Electronically Signed   By: Marijo Conception M.D.   On: 01/30/2020 08:24     PERTINENT LAB RESULTS: CBC: Recent Labs    01/30/20 0157 01/31/20 0126  WBC 12.4* 15.3*  HGB 8.6* 8.1*  HCT 28.0* 26.3*  PLT 301 298   CMET CMP     Component Value Date/Time   NA 135 01/31/2020 0126   NA 138 01/09/2020 1023   K 4.8 01/31/2020 0126   CL 100 01/31/2020 0126   CO2 26 01/31/2020 0126   GLUCOSE 156 (H) 01/31/2020 0126   BUN 70 (H) 01/31/2020 0126   BUN 19 01/09/2020 1023   CREATININE 2.58 (H) 01/31/2020 0126   CALCIUM 9.4 01/31/2020 0126   CALCIUM 5.1 09/05/2019 2156   PROT 6.0 (L) 01/24/2020 0619   ALBUMIN 2.5 (L) 01/24/2020 0619   AST 15 01/24/2020 0619   ALT 9 01/24/2020 0619   ALKPHOS 67 01/24/2020 0619   BILITOT 1.0 01/24/2020 0619   GFRNONAA 23 (L) 01/31/2020 0126   GFRAA 27 (L) 01/31/2020 0126    GFR Estimated Creatinine Clearance: 22.4 mL/min (A) (by C-G formula based on SCr of 2.58 mg/dL (H)). No results for input(s): LIPASE, AMYLASE in the last 72 hours. No results for input(s): CKTOTAL, CKMB, CKMBINDEX, TROPONINI in the last 72 hours. Invalid input(s): POCBNP No results for input(s): DDIMER in the last 72 hours. Recent Labs    01/31/20 0126  HGBA1C 7.0*   No results for input(s): CHOL, HDL, LDLCALC, TRIG, CHOLHDL, LDLDIRECT in the last 72 hours. No results for input(s): TSH, T4TOTAL, T3FREE, THYROIDAB in the last 72 hours.  Invalid input(s): FREET3 Recent Labs    01/29/20 0158  VITAMINB12 1,337*  FERRITIN 1,101*  TIBC 227*  IRON 62   Coags: No results for input(s): INR in the last 72 hours.  Invalid input(s): PT Microbiology: Recent Results (from the past 240 hour(s))  Culture, blood (routine x 2)     Status: None   Collection Time: 01/23/20 11:45 PM   Specimen: BLOOD  Result Value Ref Range Status   Specimen  Description BLOOD RIGHT ANTECUBITAL  Final   Special Requests   Final    BOTTLES DRAWN AEROBIC AND ANAEROBIC Blood Culture adequate volume   Culture   Final    NO GROWTH 5 DAYS Performed at [facility], [address], [place], [place] [address]    Report Status 01/29/2020 FINAL  Final  Culture, blood (routine x 2)     Status: None   Collection Time: 01/23/20 11:47 PM   Specimen: BLOOD  Result Value Ref Range Status   Specimen Description BLOOD LEFT ANTECUBITAL  Final   Special Requests   Final    BOTTLES DRAWN AEROBIC AND ANAEROBIC Blood Culture adequate volume   Culture   Final    NO GROWTH 5 DAYS Performed at [facility], [place],  [place] [address]    Report Status 01/29/2020 FINAL  Final  Urine culture     Status: None   Collection Time: 01/24/20  2:58 AM   Specimen: Urine, Random  Result Value Ref Range Status   Specimen Description URINE, RANDOM  Final   Special Requests NONE  Final   Culture   Final    NO GROWTH Performed at [facility], [place] [address] [place], [place] [address]    Report Status 01/24/2020 FINAL  Final  Respiratory Panel by PCR     Status: None   Collection Time: 01/25/20 12:36 PM   Specimen: Nasopharyngeal Swab; Respiratory  Result Value Ref Range Status   Adenovirus NOT DETECTED NOT DETECTED Final   Coronavirus 229E NOT DETECTED NOT DETECTED Final    Comment: (NOTE) The Coronavirus on the Respiratory Panel, DOES NOT test for the novel  Coronavirus (2019 nCoV)    Coronavirus HKU1 NOT DETECTED NOT DETECTED Final   Coronavirus NL63 NOT DETECTED NOT DETECTED Final   Coronavirus OC43 NOT DETECTED NOT DETECTED Final   Metapneumovirus NOT DETECTED NOT DETECTED Final   Rhinovirus / Enterovirus NOT DETECTED NOT DETECTED Final   Influenza A NOT DETECTED NOT DETECTED Final   Influenza B NOT DETECTED NOT DETECTED Final   Parainfluenza Virus 1 NOT DETECTED NOT DETECTED Final   Parainfluenza Virus 2 NOT DETECTED NOT  DETECTED Final   Parainfluenza Virus 3 NOT DETECTED NOT DETECTED Final   Parainfluenza Virus 4 NOT DETECTED NOT DETECTED Final   Respiratory Syncytial Virus NOT DETECTED NOT DETECTED Final   Bordetella pertussis NOT DETECTED NOT DETECTED Final   Chlamydophila pneumoniae NOT DETECTED NOT DETECTED Final   Mycoplasma pneumoniae NOT DETECTED NOT DETECTED Final    Comment: Performed at [facility], [place], [place], [place] [address]    FURTHER DISCHARGE INSTRUCTIONS:  Get Medicines reviewed and adjusted: Please take all your medications with you for your next visit with your Primary MD  Laboratory/radiological data: Please request your Primary MD to go over all hospital tests and procedure/radiological results at the follow up, please ask your Primary MD to get all Hospital records sent to his/her office.  In some cases, they will be blood work, cultures and biopsy results pending at the time of your discharge. Please request that your primary care M.D. goes through all the records of your hospital data and follows up on these results.  Also Note the following: If you experience worsening of your admission symptoms, develop shortness of breath, life threatening emergency, suicidal or homicidal thoughts you must seek medical attention immediately by calling 911 or calling your MD immediately  if symptoms less severe.  You must read complete instructions/literature along with all the possible adverse reactions/side effects for all the Medicines you take and that have been prescribed to you. Take any new Medicines after you have completely understood and accpet all the possible adverse reactions/side effects.   Do not drive when taking Pain medications or sleeping medications (Benzodaizepines)  Do not take more than prescribed Pain, Sleep and Anxiety Medications. It is not advisable to combine anxiety,sleep and pain medications without talking with your primary care  practitioner  Special Instructions: If you have smoked or chewed Tobacco  in the last 2 yrs please stop smoking, stop any regular Alcohol  and or any Recreational drug use.  Wear Seat belts while driving.  Please note: You were cared for by a hospitalist during your hospital stay. Once you  are discharged, your primary care physician will handle any further medical issues. Please note that NO REFILLS for any discharge medications will be authorized once you are discharged, as it is imperative that you return to your primary care physician (or establish a relationship with a primary care physician if you do not have one) for your post hospital discharge needs so that they can reassess your need for medications and monitor your lab values.  Total Time spent coordinating discharge including counseling, education and face to face time equals35 minutes.  SignedOren Binet 01/31/2020 11:41 AM

## 2020-01-31 NOTE — Care Management (Signed)
[facility] called  

## 2020-01-31 NOTE — Progress Notes (Signed)
Call [facility] at [phone], gave report to [facility]

## 2020-01-31 NOTE — TOC Transition Note (Signed)
Transition of Care [facility]) - CM/SW Discharge Note   Patient Details  Name: Robert Little MRN: [id number] Date of Birth: 10-06-1940  [facility] CM/SW Contact:  Verdell Carmine, RN Phone Number: 01/31/2020, 2:54 PM   Clinical Narrative:    Robert Little to Robert Little has accepted the patient. Right now this is a short term rehab, but they will continue to assess him.  They have the PASSR and all the paperwork needed was faxed over. Robert Little   Is relieved that he is being taken care of in a higher level of care as this is needed. He is still the point of contact for decision making.    Final next level of care: Skilled Nursing Facility Barriers to Discharge: No Barriers Identified   Patient Goals and CMS Choice   CMS Medicare.gov Compare Post Acute Care list provided to:: Patient Choice offered to / list presented to : [place] / [place]  Discharge Placement              Patient chooses bed at: [facility] Patient to be transferred to facility by: [facility] Name of family member notified: Robert Little Patient and family notified of of transfer: 01/31/20  Discharge Plan and Services In-house Referral: Clinical Social Work Discharge Planning Services: CM Consult Post Acute Care Choice: [place]                               Social Determinants of Health (SDOH) Interventions     Readmission Risk Interventions Readmission Risk Prevention Plan 11/23/2019 09/20/2019  Transportation Screening Complete Complete  PCP or Specialist Appt within 3-5 Days - Complete  HRI or [place] - Complete  Social Work Consult for [place] Planning/Counseling - Complete  Palliative Care Screening - Not Applicable  Medication Review Press photographer) Complete Complete  PCP or Specialist appointment within 3-5 days of discharge Complete -  [place] or Home Care Consult Complete -  SW Recovery Care/Counseling Consult Complete -   Palliative Care Screening Not Applicable -  [place] Complete -

## 2020-02-07 ENCOUNTER — Ambulatory Visit: Payer: Medicare Other | Admitting: Cardiovascular Disease

## 2020-02-07 NOTE — Telephone Encounter (Signed)
Called and spoke to patient's caregiver - pt is in a rehab facility and will call us to schedule an appointment once he is released from rehab-pr

## 2020-02-22 ENCOUNTER — Inpatient Hospital Stay: Payer: Medicare Other | Admitting: Primary Care

## 2020-03-03 NOTE — Progress Notes (Deleted)
Cardiology Clinic Note   Patient Name: Robert Little Date of Encounter: 03/03/2020  Primary Care Provider:  Care, [name] [facility] Primary Cardiologist:  Sanda Klein, MD  Patient Profile    Robert Little 79 year old male presents to the clinic today for follow-up evaluation of his chronic diastolic CHF, and PAF.  Past Medical History    Past Medical History:  Diagnosis Date  . Anemia   . Chronic diastolic CHF (congestive heart failure) ([place])   . Chronic respiratory failure ([place])   . CKD (chronic kidney disease), stage IV ([place])   . Coronary artery disease    CABG 2005 at [facility]  . COVID-19 11/2019   pneumonia  . Diabetes mellitus ([place])   . Dysphagia   . GERD (gastroesophageal reflux disease)   . Hypertension   . Lung nodule   . Pulmonary fibrosis ([place])   . Schizo affective schizophrenia ([place])   . Tobacco abuse    Past Surgical History:  Procedure Laterality Date  . CORONARY ARTERY BYPASS GRAFT      Allergies  No Known Allergies  History of Present Illness    Mr. Unrein has a PMH of coronary artery disease status post CABG ([facility]), diabetes mellitus type 2, hypertension, hyperlipidemia, pulmonary fibrosis with chronic respiratory failure on home O2, COVID-19 pneumonia 5/21, schizoaffective disorder and group home, CKD stage IV, anemia of chronic disease, GERD, chronic diastolic CHF, lung nodule, extensive tobacco history, and dysphagia.  He was noted to have mild demand myocardial injury and acute exacerbation of diastolic CHF during pneumonia with acute on chronic respiratory failure.  Admitted 01/23/2020-01/31/2020.  His hypoxia was felt to be related to toxicity from his amiodarone.  Lower extremity Dopplers negative for DVT.  VQ scan showed low probability.  Blood cultures/urine culture showed no growth.  He was continued on 2-3 L of oxygen.  He was given a steroid taper and his amiodarone was placed on hold well on steroids.   It was felt that his minimal troponin elevations were related to demand ischemia and not ACS.  Echocardiogram showed stable findings.  He presents to the clinic today for follow-up evaluation and states***  *** denies chest pain, shortness of breath, lower extremity edema, fatigue, palpitations, melena, hematuria, hemoptysis, diaphoresis, weakness, presyncope, syncope, orthopnea, and PND.   Home Medications    Prior to Admission medications   Medication Sig Start Date End Date Taking? Authorizing Provider  acetaminophen (TYLENOL) 325 MG tablet Take 2 tablets (650 mg total) by mouth every 6 (six) hours as needed for mild pain (or Fever >/= 101). Patient taking differently: Take 650 mg by mouth every 6 (six) hours as needed for mild pain.  05/18/18   Debbe Odea, MD  albuterol (VENTOLIN HFA) 108 (90 Base) MCG/ACT inhaler Inhale 2 puffs into the lungs every 6 (six) hours as needed for wheezing or shortness of breath. 09/21/19   Mercy Riding, MD  aspirin EC 81 MG EC tablet Take 1 tablet (81 mg total) by mouth daily. 09/22/19   Mercy Riding, MD  atorvastatin (LIPITOR) 20 MG tablet Take 20 mg by mouth at bedtime.    [provider]  clonazePAM (KLONOPIN) 0.5 MG tablet Take 1 tablet (0.5 mg total) by mouth 2 (two) times daily. 01/31/20   Ghimire, Henreitta Leber, MD  Emollient (EUCERIN SKIN CALMING) CREA Apply topically in the morning and at bedtime. To legs, arms and back    [provider]  feeding supplement,  ENSURE ENLIVE, (ENSURE ENLIVE) LIQD Take 237 mLs by mouth 2 (two) times daily between meals. Patient not taking: Reported on 01/23/2020 11/30/19   Thurnell Lose, MD  ferrous sulfate 325 (65 FE) MG tablet Take 1 tablet (325 mg total) by mouth 2 (two) times daily with a meal. 09/21/19   Gonfa, Charlesetta Ivory, MD  fluticasone furoate-vilanterol (BREO ELLIPTA) 200-25 MCG/INH AEPB Inhale 1 puff into the lungs daily. 09/22/19   Mercy Riding, MD  guaiFENesin (MUCINEX) 600 MG 12 hr tablet Take  600 mg by mouth 2 (two) times daily.    [provider]  insulin aspart (NOVOLOG) 100 UNIT/ML injection 0-15 Units, Subcutaneous, 3 times daily with meals Correction coverage: Moderate (average weight, post-op) CBG < 70: Implement Hypoglycemia measures CBG 70 - 120: 0 units CBG 121 - 150: 2 units CBG 151 - 200: 3 units CBG 201 - 250: 5 units CBG 251 - 300: 8 units CBG 301 - 350: 11 units CBG 351 - 400: 15 units CBG > 400: call MD 01/31/20   Jonetta Osgood, MD  insulin glargine (LANTUS) 100 UNIT/ML injection Inject 0.16 mLs (16 Units total) into the skin daily. 02/01/20   Ghimire, Henreitta Leber, MD  ipratropium (ATROVENT) 0.03 % nasal spray Place 1 spray into the nose daily as needed for congestion. 11/02/19   [provider]  ipratropium-albuterol (DUONEB) 0.5-2.5 (3) MG/3ML SOLN Take 3 mLs by nebulization every 8 (eight) hours.    [provider]  magnesium oxide (MAG-OX) 400 MG tablet Take 400 mg by mouth in the morning, at noon, and at bedtime.    [provider]  Multiple Vitamin (MULTIVITAMIN WITH MINERALS) TABS tablet Take 1 tablet by mouth daily.    [provider]  pantoprazole (PROTONIX) 40 MG tablet Take 1 tablet (40 mg total) by mouth daily. 11/17/19   Thurnell Lose, MD  polyethylene glycol powder (MIRALAX) 17 GM/SCOOP powder Take 17 g by mouth 2 (two) times daily as needed for moderate constipation. 09/21/19   Mercy Riding, MD  predniSONE (DELTASONE) 20 MG tablet Take 2 tablets (40 mg total) by mouth daily with breakfast. 02/01/20   Ghimire, Henreitta Leber, MD  QUEtiapine (SEROQUEL) 25 MG tablet Take 1 tablet (25 mg total) by mouth at bedtime. 11/30/19   Thurnell Lose, MD  tamsulosin (FLOMAX) 0.4 MG CAPS capsule Take 1 capsule (0.4 mg total) by mouth daily after supper. Patient taking differently: Take 0.8 mg by mouth daily after supper.  05/18/18   Debbe Odea, MD  umeclidinium bromide (INCRUSE ELLIPTA) 62.5 MCG/INH AEPB Inhale 1 puff  into the lungs daily. 09/22/19   Mercy Riding, MD  vitamin B-12 (CYANOCOBALAMIN) 500 MCG tablet Take 500 mcg by mouth 2 (two) times daily.    [provider]    Family History    Family History  Problem Relation Age of Onset  . Diabetes Mellitus II Mother   . Diabetes Mellitus II Maternal Grandmother   . Heart disease Maternal Grandmother    He indicated that the status of his mother is unknown. He indicated that the status of his maternal grandmother is unknown.  Social History    Social History   Socioeconomic History  . Marital status: Unknown    Spouse name: Not on file  . Number of children: Not on file  . Years of education: Not on file  . Highest education level: Not on file  Occupational History  . Not on file  Tobacco Use  . Smoking status: Former Smoker    Types: Cigarettes  . Smokeless tobacco: Never Used  Vaping Use  . Vaping Use: Never used  Substance and Sexual Activity  . Alcohol use: Not Currently  . Drug use: Not Currently  . Sexual activity: Not Currently  Other Topics Concern  . Not on file  Social History Narrative  . Not on file   Social Determinants of Health   Financial Resource Strain:   . Difficulty of Paying Living Expenses: Not on file  Food Insecurity:   . Worried About Charity fundraiser in the Last Year: Not on file  . Ran Out of Food in the Last Year: Not on file  Transportation Needs:   . Lack of Transportation (Medical): Not on file  . Lack of Transportation (Non-Medical): Not on file  Physical Activity:   . Days of Exercise per Week: Not on file  . Minutes of Exercise per Session: Not on file  Stress:   . Feeling of Stress : Not on file  Social Connections:   . Frequency of Communication with Friends and Family: Not on file  . Frequency of Social Gatherings with Friends and Family: Not on file  . Attends Religious Services: Not on file  . Active Member of Clubs or Organizations: Not on file  . Attends Theatre manager Meetings: Not on file  . Marital Status: Not on file  Intimate Partner Violence:   . Fear of Current or Ex-Partner: Not on file  . Emotionally Abused: Not on file  . Physically Abused: Not on file  . Sexually Abused: Not on file     Review of Systems    General:  No chills, fever, night sweats or weight changes.  Cardiovascular:  No chest pain, dyspnea on exertion, edema, orthopnea, palpitations, paroxysmal nocturnal dyspnea. Dermatological: No rash, lesions/masses Respiratory: No cough, dyspnea Urologic: No hematuria, dysuria Abdominal:   No nausea, vomiting, diarrhea, bright red blood per rectum, melena, or hematemesis Neurologic:  No visual changes, wkns, changes in mental status. All other systems reviewed and are otherwise negative except as noted above.  Physical Exam    VS:  There were no vitals taken for this visit. , BMI There is no height or weight on file to calculate BMI. GEN: Well nourished, well developed, in no acute distress. HEENT: normal. Neck: Supple, no JVD, carotid bruits, or masses. Cardiac: RRR, no murmurs, rubs, or gallops. No clubbing, cyanosis, edema.  Radials/DP/PT 2+ and equal bilaterally.  Respiratory:  Respirations regular and unlabored, clear to auscultation bilaterally. GI: Soft, nontender, nondistended, BS + x 4. MS: no deformity or atrophy. Skin: warm and dry, no rash. Neuro:  Strength and sensation are intact. Psych: Normal affect.  Accessory Clinical Findings    Recent Labs: 01/24/2020: ALT 9; TSH 5.133 01/25/2020: B Natriuretic Peptide 549.0 01/31/2020: BUN 70; Creatinine, Ser 2.58; Hemoglobin 8.1; Magnesium 1.8; Platelets 298; Potassium 4.8; Sodium 135   Recent Lipid Panel    Component Value Date/Time   CHOL 85 09/21/2019 0200   TRIG 56 09/21/2019 0200   HDL 33 (L) 09/21/2019 0200   CHOLHDL 2.6 09/21/2019 0200   VLDL 11 09/21/2019 0200   LDLCALC 41 09/21/2019 0200    ECG personally reviewed by me today- *** - No acute  changes  Echocardiogram 01/24/2020   Left ventricular ejection fraction, by estimation, is 60 to 65%. The  left ventricle has normal function. The left ventricle has no regional  wall  motion abnormalities. There is mild left ventricular hypertrophy.  Left ventricular diastolic parameters  are consistent with Grade II diastolic dysfunction (pseudonormalization).  Elevated left atrial pressure.  2. Right ventricular systolic function is normal. The right ventricular  size is mildly enlarged. Tricuspid regurgitation signal is inadequate for  assessing PA pressure.  3. The mitral valve is normal in structure. Mild mitral valve  regurgitation.  4. The aortic valve is tricuspid. Aortic valve regurgitation is not  visualized. Mild to moderate aortic valve sclerosis/calcification is  present, without any evidence of aortic stenosis.   Assessment & Plan   1.  Acute on chronic diastolic heart failure-no increased activity intolerance or DOE today.  Echocardiogram 01/24/2020 showed LVEF of 60-65%, G2 DD, mildly enlarged right ventricle, mild mitral valve regurgitation, mild-moderate aortic valve sclerosis. Continue aspirin, atorvastatin, Heart healthy low-sodium diet-salty 6 given Increase physical activity as tolerated Daily weights Lower extremity support stockings Elevate lower extremities when not active Contact office with weight increase of 3 pounds overnight or 5 pounds in 1 week (call office with weight of 150 pounds)  Coronary artery disease-no chest pain today.  Status post CABG [facility] 8/05. Continue aspirin, atorvastatin, insulin Heart healthy low-sodium diet-salty 6 given Increase physical activity as tolerated  Essential hypertension- BP today***.  Well-controlled at home Heart healthy low-sodium diet-salty 6 given Increase physical activity as tolerated  Hyperlipidemia -09/21/2019: Cholesterol 85; HDL 33; LDL Cholesterol 41; Triglycerides 56; VLDL 11 Continue  atorvastatin Heart healthy low-sodium high-fiber diet Avoid caffeine, chocolate, EtOH etc.  Paroxysmal atrial fibrillation-heart rate today***.  Not a candidate for anticoagulation due to fall risk.  Rate controlled. Continue aspirin Heart healthy low-sodium diet-salty 6 given Increase physical activity as tolerated    Disposition: Follow-up with Dr. Sallyanne Kuster in 3 months.   Jossie Ng. Deadra Diggins NP-C    03/03/2020, 2:06 PM [address] Office [phone] Fax [phone]  Notice: This dictation was prepared with Dragon dictation along with smaller phrase technology. Any transcriptional errors that result from this process are unintentional and may not be corrected upon review.

## 2020-03-06 ENCOUNTER — Ambulatory Visit: Payer: Medicare Other | Admitting: General Practice

## 2020-04-16 ENCOUNTER — Ambulatory Visit: Payer: Medicare Other | Admitting: Cardiovascular Disease

## 2020-07-20 IMAGING — US US RENAL
1 series · 14 of 25 positions shown · non-contrast
Comparison: None.

CLINICAL DATA: Initial evaluation for acute renal injury.

EXAM:
RENAL / URINARY TRACT ULTRASOUND COMPLETE

[Series 1: us renal · 0.25mm/px · 14 of 38 slices shown]
[im 1/38]
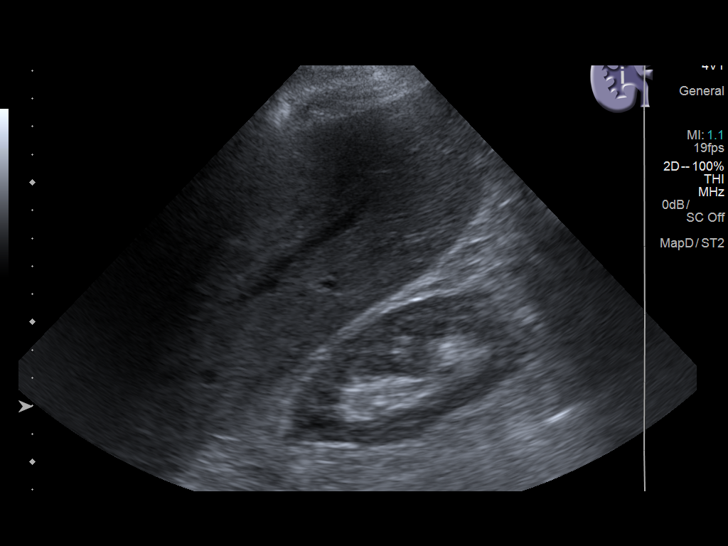
[im 4/38]
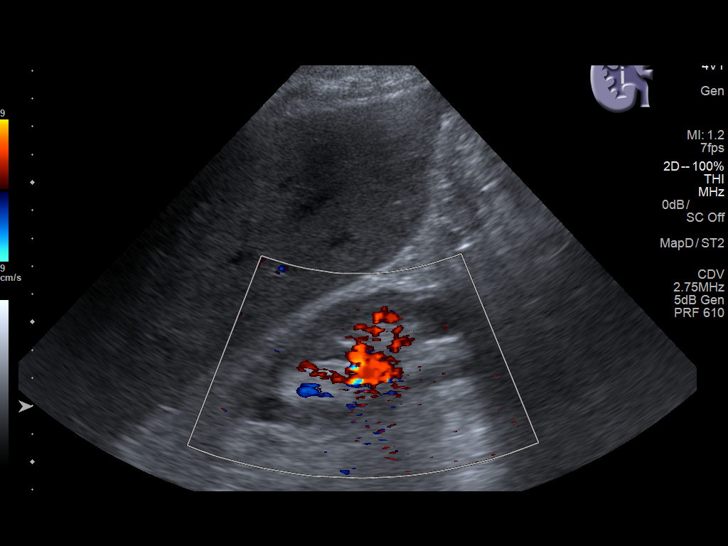
[im 7/38]
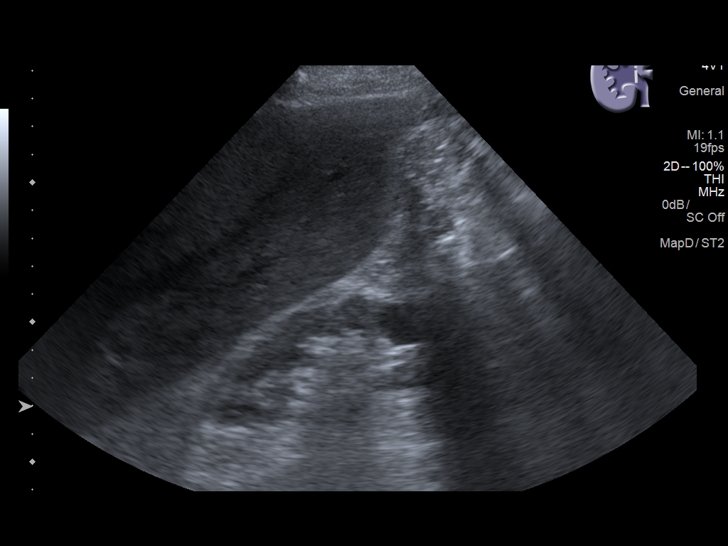
[im 10/38]
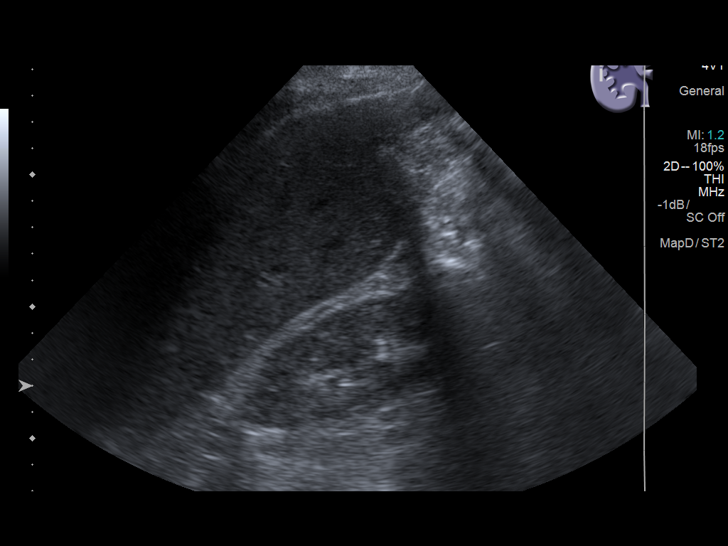
[im 13/38]
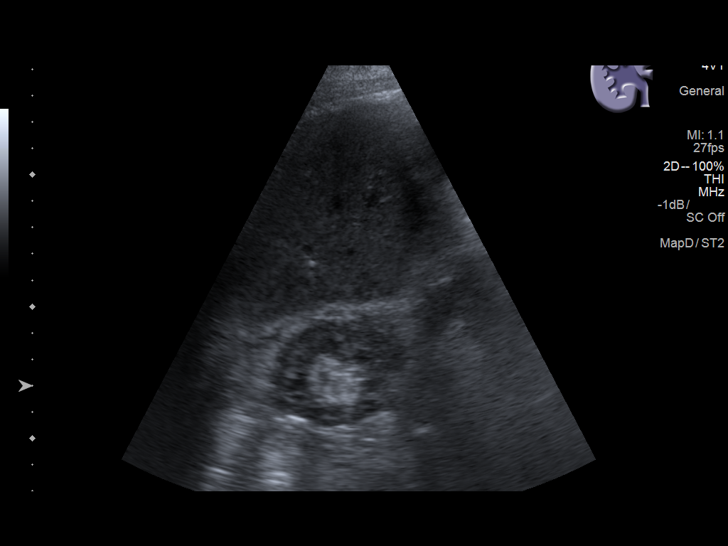
[im 14/38]
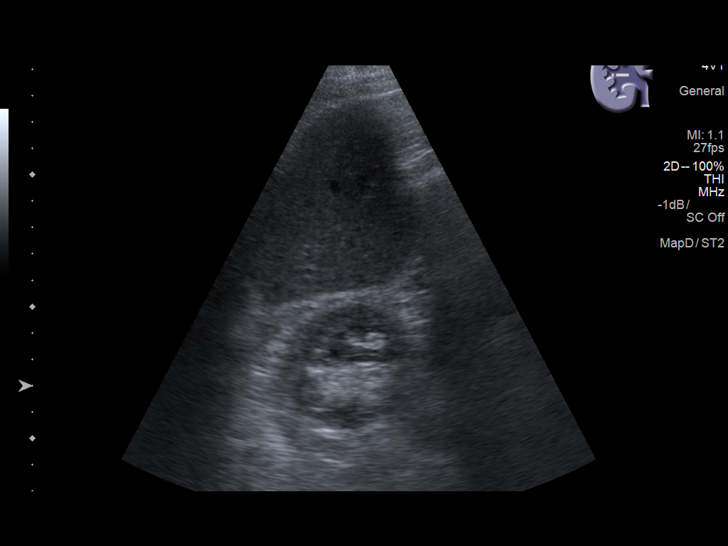
[im 17/38]
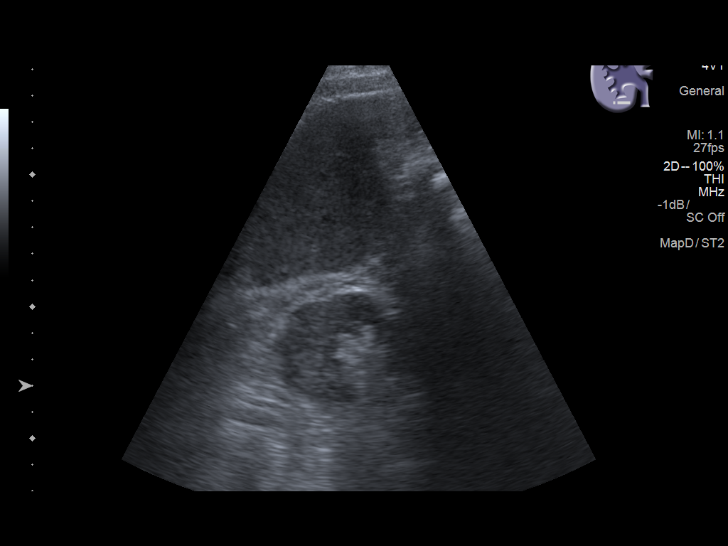
[im 21/38]
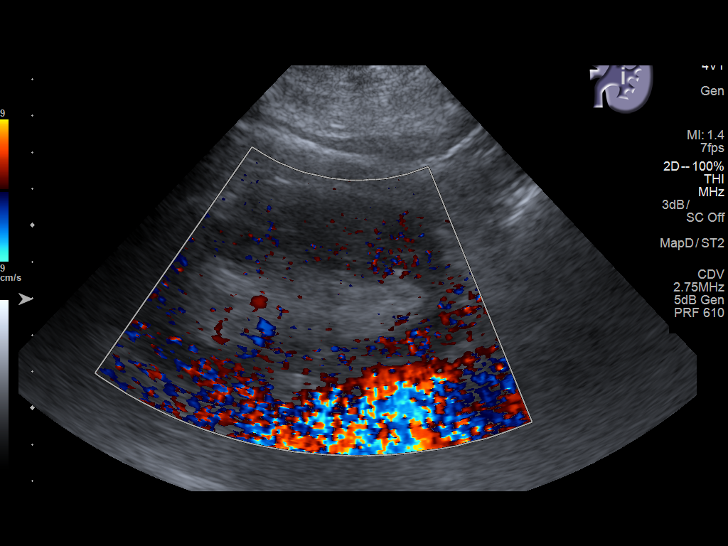
[im 24/38]
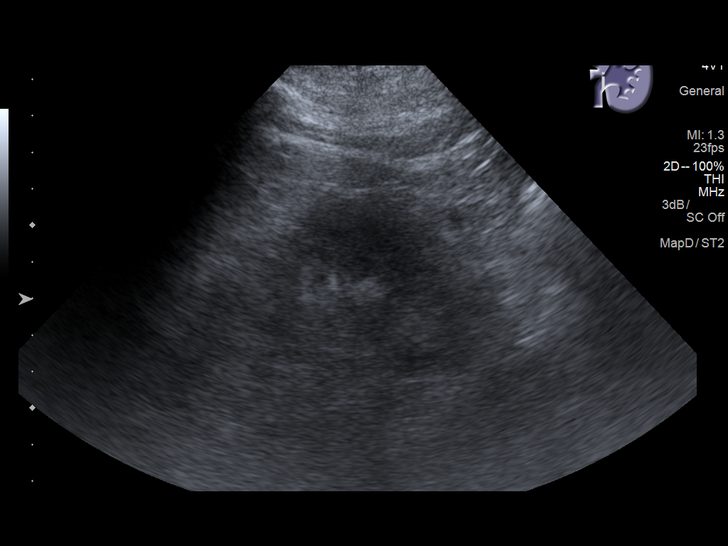
[im 25/38]
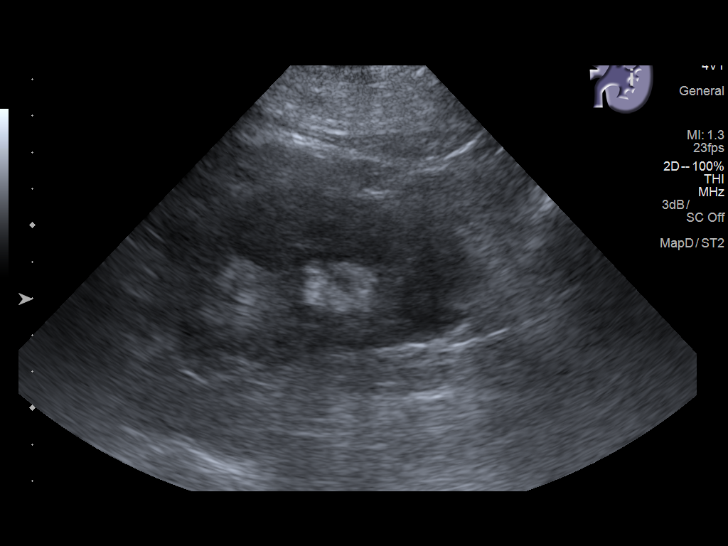
[im 28/38]
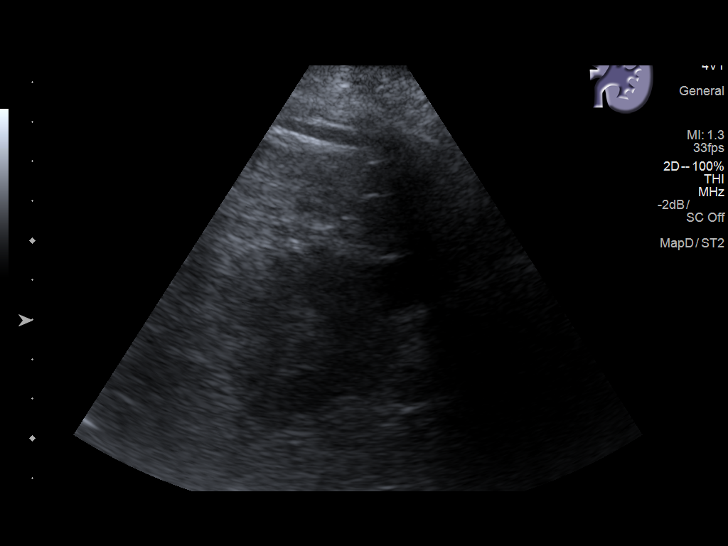
[im 31/38]
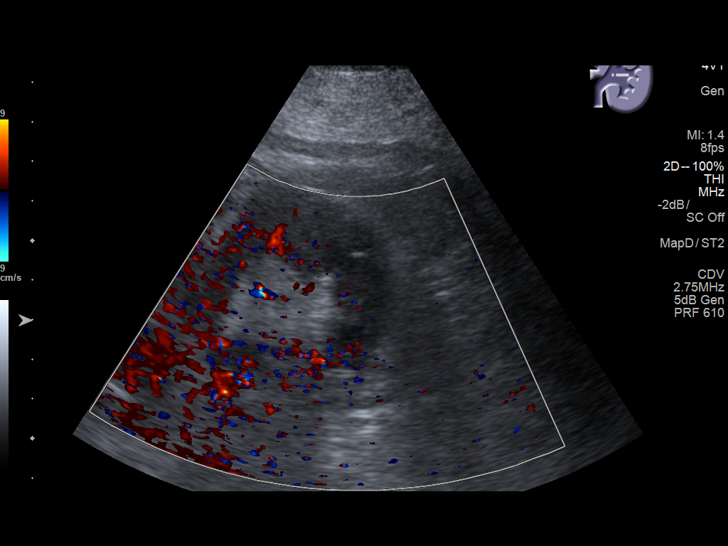
[im 34/38]
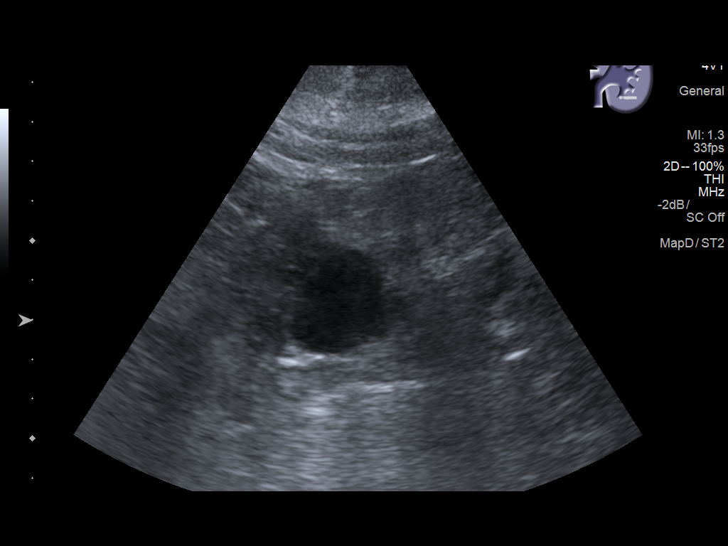
[im 38/38]
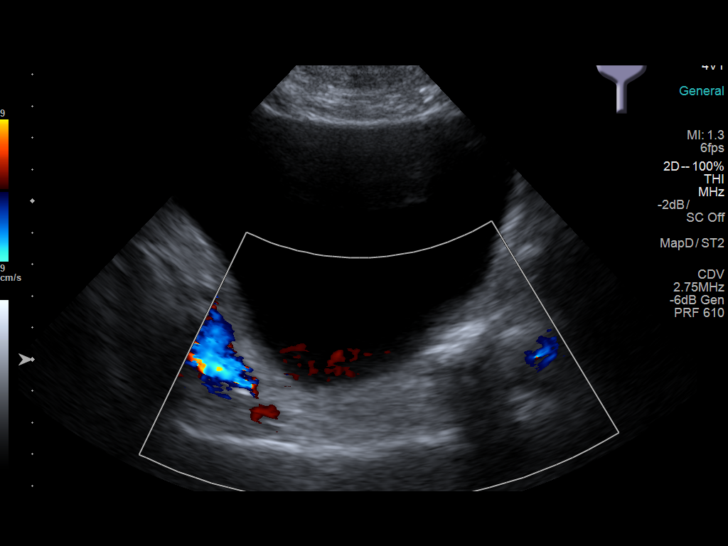

[14 of 25 positions shown; findings below may reference images not displayed]

FINDINGS: Right Kidney:

Renal measurements: 8.8 x 4.3 x 4.8 cm = volume: 92.5 mL .
Echogenicity within normal limits. No hydronephrosis. 1.7 x 1.6 x
1.4 cm simple cyst present at the lower pole.

Left Kidney:

Renal measurements: 9.5 x 5.1 x 5.0 cm = volume: 126.7 mL.
Echogenicity within normal limits. No hydronephrosis. 1.5 x 1.4 x
1.5 cm simple cyst present at the upper pole. Additional 3.0 x 2.8 x
2.8 cm simple cyst present at the lower pole.

Bladder:

Appears normal for degree of bladder distention. Bilateral ureteral
jets visualized.
IMPRESSION: 1. No hydronephrosis.
2. Simple bilateral renal cysts as above.

## 2021-02-17 ENCOUNTER — Emergency Department: Payer: Medicare Other

## 2021-02-17 ENCOUNTER — Observation Stay: Payer: Medicare Other

## 2021-02-17 ENCOUNTER — Other Ambulatory Visit: Payer: Self-pay

## 2021-02-17 ENCOUNTER — Inpatient Hospital Stay
Admission: EM | Admit: 2021-02-17 | Discharge: 2021-02-24 | DRG: 193 | Disposition: A | Discharge status: Skilled Nursing Facility | Payer: Medicare Other | Source: Skilled Nursing Facility | Attending: Internal Medicine | Admitting: Internal Medicine

## 2021-02-17 ENCOUNTER — Encounter: Payer: Self-pay | Admitting: Emergency Medicine

## 2021-02-17 DIAGNOSIS — N1832 Chronic kidney disease, stage 3b: Secondary | ICD-10-CM

## 2021-02-17 DIAGNOSIS — Z6822 Body mass index (BMI) 22.0-22.9, adult: Secondary | ICD-10-CM

## 2021-02-17 DIAGNOSIS — Z66 Do not resuscitate: Secondary | ICD-10-CM | POA: Diagnosis present

## 2021-02-17 DIAGNOSIS — C787 Secondary malignant neoplasm of liver and intrahepatic bile duct: Secondary | ICD-10-CM | POA: Diagnosis present

## 2021-02-17 DIAGNOSIS — C786 Secondary malignant neoplasm of retroperitoneum and peritoneum: Secondary | ICD-10-CM | POA: Diagnosis present

## 2021-02-17 DIAGNOSIS — E1122 Type 2 diabetes mellitus with diabetic chronic kidney disease: Secondary | ICD-10-CM | POA: Diagnosis present

## 2021-02-17 DIAGNOSIS — J189 Pneumonia, unspecified organism: Principal | ICD-10-CM | POA: Diagnosis present

## 2021-02-17 DIAGNOSIS — E782 Mixed hyperlipidemia: Secondary | ICD-10-CM

## 2021-02-17 DIAGNOSIS — Z833 Family history of diabetes mellitus: Secondary | ICD-10-CM

## 2021-02-17 DIAGNOSIS — I130 Hypertensive heart and chronic kidney disease with heart failure and stage 1 through stage 4 chronic kidney disease, or unspecified chronic kidney disease: Secondary | ICD-10-CM | POA: Diagnosis present

## 2021-02-17 DIAGNOSIS — D72829 Elevated white blood cell count, unspecified: Secondary | ICD-10-CM | POA: Diagnosis not present

## 2021-02-17 DIAGNOSIS — R778 Other specified abnormalities of plasma proteins: Secondary | ICD-10-CM | POA: Diagnosis present

## 2021-02-17 DIAGNOSIS — R509 Fever, unspecified: Secondary | ICD-10-CM

## 2021-02-17 DIAGNOSIS — N189 Chronic kidney disease, unspecified: Secondary | ICD-10-CM

## 2021-02-17 DIAGNOSIS — A419 Sepsis, unspecified organism: Secondary | ICD-10-CM | POA: Diagnosis not present

## 2021-02-17 DIAGNOSIS — Z794 Long term (current) use of insulin: Secondary | ICD-10-CM

## 2021-02-17 DIAGNOSIS — E785 Hyperlipidemia, unspecified: Secondary | ICD-10-CM | POA: Diagnosis present

## 2021-02-17 DIAGNOSIS — I248 Other forms of acute ischemic heart disease: Secondary | ICD-10-CM | POA: Diagnosis not present

## 2021-02-17 DIAGNOSIS — C3431 Malignant neoplasm of lower lobe, right bronchus or lung: Secondary | ICD-10-CM | POA: Diagnosis present

## 2021-02-17 DIAGNOSIS — Z951 Presence of aortocoronary bypass graft: Secondary | ICD-10-CM

## 2021-02-17 DIAGNOSIS — I480 Paroxysmal atrial fibrillation: Secondary | ICD-10-CM | POA: Diagnosis present

## 2021-02-17 DIAGNOSIS — R7989 Other specified abnormal findings of blood chemistry: Secondary | ICD-10-CM | POA: Diagnosis not present

## 2021-02-17 DIAGNOSIS — E43 Unspecified severe protein-calorie malnutrition: Secondary | ICD-10-CM | POA: Diagnosis present

## 2021-02-17 DIAGNOSIS — Z20822 Contact with and (suspected) exposure to covid-19: Secondary | ICD-10-CM | POA: Diagnosis present

## 2021-02-17 DIAGNOSIS — K219 Gastro-esophageal reflux disease without esophagitis: Secondary | ICD-10-CM | POA: Diagnosis present

## 2021-02-17 DIAGNOSIS — R748 Abnormal levels of other serum enzymes: Secondary | ICD-10-CM

## 2021-02-17 DIAGNOSIS — R531 Weakness: Secondary | ICD-10-CM

## 2021-02-17 DIAGNOSIS — C7989 Secondary malignant neoplasm of other specified sites: Secondary | ICD-10-CM | POA: Diagnosis present

## 2021-02-17 DIAGNOSIS — C7951 Secondary malignant neoplasm of bone: Secondary | ICD-10-CM | POA: Diagnosis present

## 2021-02-17 DIAGNOSIS — R54 Age-related physical debility: Secondary | ICD-10-CM | POA: Diagnosis present

## 2021-02-17 DIAGNOSIS — E440 Moderate protein-calorie malnutrition: Secondary | ICD-10-CM

## 2021-02-17 DIAGNOSIS — Z7951 Long term (current) use of inhaled steroids: Secondary | ICD-10-CM

## 2021-02-17 DIAGNOSIS — D729 Disorder of white blood cells, unspecified: Secondary | ICD-10-CM

## 2021-02-17 DIAGNOSIS — Z8616 Personal history of COVID-19: Secondary | ICD-10-CM

## 2021-02-17 DIAGNOSIS — Z87891 Personal history of nicotine dependence: Secondary | ICD-10-CM

## 2021-02-17 DIAGNOSIS — Z8249 Family history of ischemic heart disease and other diseases of the circulatory system: Secondary | ICD-10-CM

## 2021-02-17 DIAGNOSIS — I5032 Chronic diastolic (congestive) heart failure: Secondary | ICD-10-CM | POA: Diagnosis present

## 2021-02-17 DIAGNOSIS — J8410 Pulmonary fibrosis, unspecified: Secondary | ICD-10-CM | POA: Diagnosis present

## 2021-02-17 DIAGNOSIS — N184 Chronic kidney disease, stage 4 (severe): Secondary | ICD-10-CM | POA: Diagnosis present

## 2021-02-17 DIAGNOSIS — D631 Anemia in chronic kidney disease: Secondary | ICD-10-CM | POA: Diagnosis present

## 2021-02-17 DIAGNOSIS — E875 Hyperkalemia: Secondary | ICD-10-CM | POA: Diagnosis present

## 2021-02-17 DIAGNOSIS — I2510 Atherosclerotic heart disease of native coronary artery without angina pectoris: Secondary | ICD-10-CM | POA: Diagnosis present

## 2021-02-17 DIAGNOSIS — F259 Schizoaffective disorder, unspecified: Secondary | ICD-10-CM | POA: Diagnosis present

## 2021-02-17 DIAGNOSIS — I10 Essential (primary) hypertension: Secondary | ICD-10-CM | POA: Diagnosis present

## 2021-02-17 DIAGNOSIS — Z79899 Other long term (current) drug therapy: Secondary | ICD-10-CM

## 2021-02-17 DIAGNOSIS — Z515 Encounter for palliative care: Secondary | ICD-10-CM

## 2021-02-17 DIAGNOSIS — R64 Cachexia: Secondary | ICD-10-CM | POA: Diagnosis present

## 2021-02-17 DIAGNOSIS — F419 Anxiety disorder, unspecified: Secondary | ICD-10-CM | POA: Diagnosis present

## 2021-02-17 DIAGNOSIS — Z7982 Long term (current) use of aspirin: Secondary | ICD-10-CM

## 2021-02-17 LAB — CBC
HCT: 24.2 % — ABNORMAL LOW (ref 39.0–52.0)
Hemoglobin: 7.4 g/dL — ABNORMAL LOW (ref 13.0–17.0)
MCH: 25.7 pg — ABNORMAL LOW (ref 26.0–34.0)
MCHC: 30.6 g/dL (ref 30.0–36.0)
MCV: 84 fL (ref 80.0–100.0)
Platelets: 384 10*3/uL (ref 150–400)
RBC: 2.88 MIL/uL — ABNORMAL LOW (ref 4.22–5.81)
RDW: 15.6 % — ABNORMAL HIGH (ref 11.5–15.5)
WBC: 17.9 10*3/uL — ABNORMAL HIGH (ref 4.0–10.5)
nRBC: 0 % (ref 0.0–0.2)

## 2021-02-17 LAB — COMPREHENSIVE METABOLIC PANEL
ALT: 31 U/L (ref 0–44)
AST: 48 U/L — ABNORMAL HIGH (ref 15–41)
Albumin: 2.1 g/dL — ABNORMAL LOW (ref 3.5–5.0)
Alkaline Phosphatase: 210 U/L — ABNORMAL HIGH (ref 38–126)
Anion gap: 11 (ref 5–15)
BUN: 61 mg/dL — ABNORMAL HIGH (ref 8–23)
CO2: 19 mmol/L — ABNORMAL LOW (ref 22–32)
Calcium: 9.1 mg/dL (ref 8.9–10.3)
Chloride: 104 mmol/L (ref 98–111)
Creatinine, Ser: 2.22 mg/dL — ABNORMAL HIGH (ref 0.61–1.24)
GFR, Estimated: 30 mL/min — ABNORMAL LOW (ref >60)
Glucose, Bld: 152 mg/dL — ABNORMAL HIGH (ref 70–99)
Potassium: 5.5 mmol/L — ABNORMAL HIGH (ref 3.5–5.1)
Sodium: 134 mmol/L — ABNORMAL LOW (ref 135–145)
Total Bilirubin: 1.6 mg/dL — ABNORMAL HIGH (ref 0.3–1.2)
Total Protein: 6.6 g/dL (ref 6.5–8.1)

## 2021-02-17 LAB — URINALYSIS, COMPLETE (UACMP) WITH MICROSCOPIC
Bilirubin Urine: NEGATIVE
Glucose, UA: NEGATIVE mg/dL
Ketones, ur: NEGATIVE mg/dL
Leukocytes,Ua: NEGATIVE
Nitrite: NEGATIVE
Protein, ur: NEGATIVE mg/dL
Specific Gravity, Urine: 1.014 (ref 1.005–1.030)
pH: 5 (ref 5.0–8.0)

## 2021-02-17 LAB — TROPONIN I (HIGH SENSITIVITY)
Troponin I (High Sensitivity): 41 ng/L — ABNORMAL HIGH (ref <18)
Troponin I (High Sensitivity): 38 ng/L — ABNORMAL HIGH (ref <18)

## 2021-02-17 LAB — RESP PANEL BY RT-PCR (FLU A&B, COVID) ARPGX2
Influenza A by PCR: NEGATIVE
Influenza B by PCR: NEGATIVE
SARS Coronavirus 2 by RT PCR: NEGATIVE

## 2021-02-17 LAB — HEMOGLOBIN A1C
Hgb A1c MFr Bld: 6.9 % — ABNORMAL HIGH (ref 4.8–5.6)
Mean Plasma Glucose: 151.33 mg/dL

## 2021-02-17 LAB — GLUCOSE, CAPILLARY: Glucose-Capillary: 90 mg/dL (ref 70–99)

## 2021-02-17 MED ORDER — ONDANSETRON HCL 4 MG/2ML IJ SOLN: 4.0000 mg | Freq: Four times a day (QID) | INTRAMUSCULAR | Status: DC | PRN

## 2021-02-17 MED ORDER — INSULIN ASPART 100 UNIT/ML IJ SOLN
0.0000 [IU] | Freq: Three times a day (TID) | INTRAMUSCULAR | Status: DC
  Administered 2021-02-18 (×2): 2 [IU] via SUBCUTANEOUS
  Administered 2021-02-19: 3 [IU] via SUBCUTANEOUS
  Administered 2021-02-19: 1 [IU] via SUBCUTANEOUS
  Administered 2021-02-22: 3 [IU] via SUBCUTANEOUS
  Administered 2021-02-23 (×2): 2 [IU] via SUBCUTANEOUS
  Administered 2021-02-24: 1 [IU] via SUBCUTANEOUS
  Filled 2021-02-17 (×10): qty 1

## 2021-02-17 MED ORDER — SODIUM CHLORIDE 0.9 % IV SOLN: INTRAVENOUS | Status: AC

## 2021-02-17 MED ORDER — SODIUM CHLORIDE 0.9 % IV SOLN
1.0000 g | Freq: Once | INTRAVENOUS | Status: AC
  Administered 2021-02-17: 1 g via INTRAVENOUS
  Filled 2021-02-17: qty 10

## 2021-02-17 MED ORDER — SODIUM CHLORIDE 0.9 % IV BOLUS
1000.0000 mL | Freq: Once | INTRAVENOUS | Status: AC
  Administered 2021-02-17: 1000 mL via INTRAVENOUS

## 2021-02-17 MED ORDER — UMECLIDINIUM BROMIDE 62.5 MCG/INH IN AEPB
1.0000 | INHALATION_SPRAY | Freq: Every day | RESPIRATORY_TRACT | Status: DC
  Administered 2021-02-18 – 2021-02-24 (×7): 1 via RESPIRATORY_TRACT
  Filled 2021-02-17: qty 7

## 2021-02-17 MED ORDER — DM-GUAIFENESIN ER 30-600 MG PO TB12
1.0000 | ORAL_TABLET | Freq: Two times a day (BID) | ORAL | Status: DC
  Administered 2021-02-17 – 2021-02-24 (×13): 1 via ORAL
  Filled 2021-02-17 (×15): qty 1

## 2021-02-17 MED ORDER — SODIUM CHLORIDE 0.9 % IV SOLN
500.0000 mg | Freq: Once | INTRAVENOUS | Status: AC
  Administered 2021-02-17: 500 mg via INTRAVENOUS
  Filled 2021-02-17: qty 500

## 2021-02-17 MED ORDER — FERROUS SULFATE 325 (65 FE) MG PO TABS
325.0000 mg | ORAL_TABLET | Freq: Two times a day (BID) | ORAL | Status: DC
  Administered 2021-02-18 – 2021-02-24 (×11): 325 mg via ORAL
  Filled 2021-02-17 (×12): qty 1

## 2021-02-17 MED ORDER — FLUTICASONE FUROATE-VILANTEROL 200-25 MCG/INH IN AEPB
1.0000 | INHALATION_SPRAY | Freq: Every day | RESPIRATORY_TRACT | Status: DC
  Administered 2021-02-18 – 2021-02-23 (×6): 1 via RESPIRATORY_TRACT
  Filled 2021-02-17: qty 28

## 2021-02-17 MED ORDER — INSULIN GLARGINE-YFGN 100 UNIT/ML ~~LOC~~ SOLN
10.0000 [IU] | Freq: Every day | SUBCUTANEOUS | Status: DC
  Administered 2021-02-17: 10 [IU] via SUBCUTANEOUS
  Filled 2021-02-17 (×2): qty 0.1

## 2021-02-17 MED ORDER — POLYETHYLENE GLYCOL 3350 17 GM/SCOOP PO POWD
17.0000 g | Freq: Two times a day (BID) | ORAL | Status: DC | PRN
  Filled 2021-02-17 (×3): qty 255

## 2021-02-17 MED ORDER — QUETIAPINE FUMARATE 25 MG PO TABS
25.0000 mg | ORAL_TABLET | Freq: Every day | ORAL | Status: DC
  Administered 2021-02-18 – 2021-02-24 (×7): 25 mg via ORAL
  Filled 2021-02-17 (×7): qty 1

## 2021-02-17 MED ORDER — INSULIN ASPART 100 UNIT/ML IJ SOLN: 0.0000 [IU] | Freq: Every day | INTRAMUSCULAR | Status: DC

## 2021-02-17 MED ORDER — CLONAZEPAM 0.25 MG PO TBDP
0.2500 mg | ORAL_TABLET | Freq: Two times a day (BID) | ORAL | Status: DC | PRN
  Administered 2021-02-17 – 2021-02-23 (×2): 0.25 mg via ORAL
  Filled 2021-02-17 (×2): qty 1

## 2021-02-17 MED ORDER — ACETAMINOPHEN 325 MG PO TABS
650.0000 mg | ORAL_TABLET | Freq: Four times a day (QID) | ORAL | Status: DC | PRN
  Administered 2021-02-17 – 2021-02-18 (×2): 650 mg via ORAL
  Filled 2021-02-17 (×2): qty 2

## 2021-02-17 MED ORDER — SODIUM CHLORIDE 0.9 % IV SOLN
500.0000 mg | INTRAVENOUS | Status: DC
  Administered 2021-02-18 – 2021-02-22 (×5): 500 mg via INTRAVENOUS
  Filled 2021-02-17 (×5): qty 500

## 2021-02-17 MED ORDER — ALBUTEROL SULFATE HFA 108 (90 BASE) MCG/ACT IN AERS: 2.0000 | INHALATION_SPRAY | Freq: Four times a day (QID) | RESPIRATORY_TRACT | Status: DC | PRN

## 2021-02-17 MED ORDER — VITAMIN B-12 1000 MCG PO TABS
500.0000 ug | ORAL_TABLET | Freq: Two times a day (BID) | ORAL | Status: DC
  Administered 2021-02-18 – 2021-02-24 (×13): 500 ug via ORAL
  Filled 2021-02-17 (×13): qty 1

## 2021-02-17 MED ORDER — PANTOPRAZOLE SODIUM 40 MG PO TBEC
40.0000 mg | DELAYED_RELEASE_TABLET | Freq: Every day | ORAL | Status: DC
  Administered 2021-02-18 – 2021-02-24 (×7): 40 mg via ORAL
  Filled 2021-02-17 (×7): qty 1

## 2021-02-17 MED ORDER — ATORVASTATIN CALCIUM 20 MG PO TABS
20.0000 mg | ORAL_TABLET | Freq: Every day | ORAL | Status: DC
  Administered 2021-02-17 – 2021-02-23 (×6): 20 mg via ORAL
  Filled 2021-02-17 (×7): qty 1

## 2021-02-17 MED ORDER — HEPARIN SODIUM (PORCINE) 5000 UNIT/ML IJ SOLN
5000.0000 [IU] | Freq: Three times a day (TID) | INTRAMUSCULAR | Status: DC
  Administered 2021-02-18 (×2): 5000 [IU] via SUBCUTANEOUS
  Filled 2021-02-17 (×2): qty 1

## 2021-02-17 MED ORDER — ONDANSETRON HCL 4 MG PO TABS: 4.0000 mg | ORAL_TABLET | Freq: Four times a day (QID) | ORAL | Status: DC | PRN

## 2021-02-17 MED ORDER — ALBUTEROL SULFATE (2.5 MG/3ML) 0.083% IN NEBU: 2.5000 mg | INHALATION_SOLUTION | Freq: Four times a day (QID) | RESPIRATORY_TRACT | Status: DC | PRN

## 2021-02-17 MED ORDER — TAMSULOSIN HCL 0.4 MG PO CAPS
0.4000 mg | ORAL_CAPSULE | Freq: Every day | ORAL | Status: DC
  Administered 2021-02-18 – 2021-02-23 (×5): 0.4 mg via ORAL
  Filled 2021-02-17 (×5): qty 1

## 2021-02-17 MED ORDER — PATIROMER SORBITEX CALCIUM 8.4 G PO PACK
8.4000 g | PACK | Freq: Every day | ORAL | Status: DC
  Administered 2021-02-17: 8.4 g via ORAL
  Filled 2021-02-17 (×2): qty 1

## 2021-02-17 MED ORDER — SODIUM CHLORIDE 0.9 % IV SOLN
1.0000 g | INTRAVENOUS | Status: DC
  Administered 2021-02-18 – 2021-02-21 (×4): 1 g via INTRAVENOUS
  Filled 2021-02-17 (×5): qty 10

## 2021-02-17 MED ORDER — ADULT MULTIVITAMIN W/MINERALS CH
1.0000 | ORAL_TABLET | Freq: Every day | ORAL | Status: DC
  Administered 2021-02-18 – 2021-02-24 (×7): 1 via ORAL
  Filled 2021-02-17 (×7): qty 1

## 2021-02-17 MED ORDER — IPRATROPIUM-ALBUTEROL 0.5-2.5 (3) MG/3ML IN SOLN
3.0000 mL | Freq: Three times a day (TID) | RESPIRATORY_TRACT | Status: DC
  Administered 2021-02-17: 3 mL via RESPIRATORY_TRACT
  Filled 2021-02-17 (×2): qty 3

## 2021-02-17 MED ORDER — HEPARIN SODIUM (PORCINE) 5000 UNIT/ML IJ SOLN: 5000.0000 [IU] | Freq: Three times a day (TID) | INTRAMUSCULAR | Status: DC

## 2021-02-17 MED ORDER — ASPIRIN EC 81 MG PO TBEC
81.0000 mg | DELAYED_RELEASE_TABLET | Freq: Every day | ORAL | Status: DC
  Administered 2021-02-18: 81 mg via ORAL
  Filled 2021-02-17: qty 1

## 2021-02-17 MED ORDER — HYDRALAZINE HCL 10 MG PO TABS
10.0000 mg | ORAL_TABLET | Freq: Four times a day (QID) | ORAL | Status: DC | PRN
  Filled 2021-02-17: qty 1

## 2021-02-17 MED ORDER — ENSURE ENLIVE PO LIQD
237.0000 mL | Freq: Two times a day (BID) | ORAL | Status: DC
  Administered 2021-02-18 (×2): 237 mL via ORAL

## 2021-02-17 MED ORDER — ACETAMINOPHEN 650 MG RE SUPP: 650.0000 mg | Freq: Four times a day (QID) | RECTAL | Status: DC | PRN

## 2021-02-17 NOTE — H&P (Addendum)
History and Physical   Robert Little YSA:[id number] DOB: 1940/11/23 DOA: 02/17/2021  PCP: [name]  Outpatient Specialists: Dr. Brand Males, pulmonology Patient coming from: facility  I have personally briefly reviewed patient's old medical records in [place].  Chief Concern: Weakness  HPI: Robert Little is a 80 y.o. male with medical history significant for schizophrenia, insulin-dependent diabetes mellitus, anxiety, hyperlipidemia, GERD, hypertension, CAD, CKD 3/CKD 4, chronic anemia, presents to the emergency department from facility for chief concerns of weakness.  At bedside, he is able to tell me his name, age, and location of hospital.   He states he's been hurting all over for 1 month. He reports he has had a cough for several weeks. I did no specify if the cough is productive, as he yells at me whenever I attempt to ask patient for quality, quantity, duration information regarding his presenting symptoms.  He endorses that he has had poor appetite. When I asked why, he states 'damn it, my mouth is sore. God damn it,' When I asked if he had dysuria, he states yes. I asked how long the burning with urination has been going on, he states 'I don't know damni it.'  I asked the patient that if his heart were to stop beating would he want chest compression he said, "Damn it, just let me go. I don't want no pounding." Regarding intubation, he yells, "NO, leave me alone"  Patient is irritable and easily annoyed with questions and does not want to fully participate in physical exam.  Social history: Patient is from facility.  Unknown tobacco, EtOH, recreational drug use.  Vaccination history: Unknown  ROS: Constitutional: no weight change, no fever ENT/Mouth: no sore throat, no rhinorrhea Eyes: no eye pain, no vision changes Cardiovascular: no chest pain, no dyspnea,  no edema, no palpitations Respiratory: + cough, no sputum, no  wheezing Gastrointestinal: no nausea, no vomiting, no diarrhea, no constipation Genitourinary: no urinary incontinence, no dysuria, no hematuria Musculoskeletal: no arthralgias, no myalgias Skin: no skin lesions, no pruritus, Neuro: + weakness, no loss of consciousness, no syncope Psych: no anxiety, no depression, + decrease appetite Heme/Lymph: no bruising, no bleeding  ED Course: Discussed with emergency medicine provider, patient requiring hospitalization for chief concerns of left lower lobe pneumonia.  Vitals in the emergency department was remarkable for temperature 97.8, respiration rate of 19, heart rate 95, blood pressure 101/65, SPO2 of 99% on room air.  Labs in the emergency department was remarkable for leukocytosis of 17.9, hemoglobin 7.4, platelets 384, sodium 134, potassium 5.5, chloride 104, bicarb 19, BUN 61, serum creatinine of 2.22, GFR of 30, nonfasting blood glucose 152.  Alk phos was elevated at 210, AST 48, ALT 31, T bili 1.6.  Assessment/Plan  Principal Problem:   Left lower lobe pneumonia Active Problems:   Essential hypertension   Schizo affective schizophrenia ([place])   HLD (hyperlipidemia)   CKD (chronic kidney disease), stage IV (HCC)   Malnutrition of moderate degree   Elevated troponin   Paroxysmal atrial fibrillation (HCC)   Neutrophilic leukocytosis   # Left lower lobe pneumonia per chest x-ray read - Patient did endorse cough with me, unclear if it is productive - COVID/influenza A/influenza B PCR were negative - Status post ceftriaxone and azithromycin IV per ED provider - Continue ceftriaxone and azithromycin - CBC in the a.m.  # History of chronic leukocytosis - WBC has been chronically elevated in 2021 - This is the highest level its been -  Patient would benefit from outpatient evaluation and work-up for carcinoma  # Elevated liver enzymes - LFTs in the a.m. - Right upper quadrant abdominal ultrasound ordered  # Severe protein/calorie  malnutrition-resumed Ensure supplements - Consult placed to registered dietitian  # Elevated troponin with downtime trending delta troponin - Patient denies chest pain and shortness of breath at this time - Low clinical suspicion for ACS  # Insulin-dependent diabetes mellitus-I suspect this is type II - Resumed home long-acting 10 units nightly - Insulin SSI with at bedtime coverage ordered - Check A1c  # Acute on chronic anemia - Hemoglobin ranged of 7.7-8.6 in 2021 - CBC in the a.m. - Patient denies nausea vomiting, black stool, bleeding and is otherwise hemodynamically stable - Resumed iron tablets twice daily with meals  # Query dementia # Schizophrenia with schizoaffective disorder - Resumed home quetiapine 25 mg p.o. daily  # Anxiety-resume Klonopin 0.25 mg p.o. twice daily as needed for anxiety  I cannot see lesions in his mouth-he gets very agitated when I ask him to do too many things -From his opening his mouth and talking, I do appreciate poor dentition very age-appropriate, however I do not see overt lesions and or purulence in his mouth  Chart reviewed.   DVT prophylaxis: Heparin 5000 units every 8 hours ordered Code Status: DNR Diet: Heart healthy Family Communication: No Disposition Plan: Pending clinical course Consults called: Dietary consult Admission status: MedSurg, observation, telemetry ordered  Past Medical History:  Diagnosis Date   Anemia    Chronic diastolic CHF (congestive heart failure) ([place])    Chronic respiratory failure (HCC)    CKD (chronic kidney disease), stage IV ([place])    Coronary artery disease    CABG 2005 at [facility]   COVID-19 11/2019   pneumonia   Diabetes mellitus ([place])    Dysphagia    GERD (gastroesophageal reflux disease)    Hypertension    Lung nodule    Pulmonary fibrosis (HCC)    Schizo affective schizophrenia ([place])    Tobacco abuse    Past Surgical History:  Procedure Laterality Date   CORONARY ARTERY BYPASS GRAFT      Social History:  reports that he has quit smoking. His smoking use included cigarettes. He has never used smokeless tobacco. He reports that he does not currently use alcohol. He reports that he does not currently use drugs.  No Known Allergies Family History  Problem Relation Age of Onset   Diabetes Mellitus II Mother    Diabetes Mellitus II Maternal Grandmother    Heart disease Maternal Grandmother    Family history: Family history reviewed and not pertinent  Prior to Admission medications   Medication Sig Start Date End Date Taking? Authorizing Provider  acetaminophen (TYLENOL) 325 MG tablet Take 2 tablets (650 mg total) by mouth every 6 (six) hours as needed for mild pain (or Fever >/= 101). Patient taking differently: Take 650 mg by mouth every 6 (six) hours as needed for mild pain. 05/18/18  Yes Debbe Odea, MD  aspirin EC 81 MG EC tablet Take 1 tablet (81 mg total) by mouth daily. 09/22/19  Yes Mercy Riding, MD  atorvastatin (LIPITOR) 20 MG tablet Take 20 mg by mouth at bedtime.   Yes [provider]  clonazePAM (KLONOPIN) 0.25 MG disintegrating tablet Take 0.25 mg by mouth 2 (two) times daily. 02/10/21  Yes [provider]  Emollient (EUCERIN SKIN CALMING) CREA Apply topically in the morning and at bedtime. To legs, arms  and back   Yes [provider]  ferrous sulfate 325 (65 FE) MG tablet Take 1 tablet (325 mg total) by mouth 2 (two) times daily with a meal. 09/21/19  Yes Gonfa, Taye T, MD  fluticasone furoate-vilanterol (BREO ELLIPTA) 200-25 MCG/INH AEPB Inhale 1 puff into the lungs daily. 09/22/19  Yes Mercy Riding, MD  insulin aspart (NOVOLOG) 100 UNIT/ML injection 0-15 Units, Subcutaneous, 3 times daily with meals Correction coverage: Moderate (average weight, post-op) CBG < 70: Implement Hypoglycemia measures CBG 70 - 120: 0 units CBG 121 - 150: 2 units CBG 151 - 200: 3 units CBG 201 - 250: 5 units CBG 251 - 300: 8 units CBG 301 - 350: 11  units CBG 351 - 400: 15 units CBG > 400: call MD 01/31/20  Yes Ghimire, Henreitta Leber, MD  insulin glargine (LANTUS) 100 UNIT/ML injection Inject 0.16 mLs (16 Units total) into the skin daily. 02/01/20  Yes Ghimire, Henreitta Leber, MD  ipratropium-albuterol (DUONEB) 0.5-2.5 (3) MG/3ML SOLN Take 3 mLs by nebulization every 8 (eight) hours.   Yes [provider]  magnesium oxide (MAG-OX) 400 MG tablet Take 400 mg by mouth in the morning, at noon, and at bedtime.   Yes [provider]  Multiple Vitamin (MULTIVITAMIN WITH MINERALS) TABS tablet Take 1 tablet by mouth daily.   Yes [provider]  pantoprazole (PROTONIX) 40 MG tablet Take 1 tablet (40 mg total) by mouth daily. 11/17/19  Yes Thurnell Lose, MD  polyethylene glycol powder (MIRALAX) 17 GM/SCOOP powder Take 17 g by mouth 2 (two) times daily as needed for moderate constipation. 09/21/19  Yes Mercy Riding, MD  QUEtiapine (SEROQUEL) 25 MG tablet Take 1 tablet (25 mg total) by mouth at bedtime. Patient taking differently: Take 25-50 mg by mouth 2 (two) times daily. 11/30/19  Yes Thurnell Lose, MD  tamsulosin (FLOMAX) 0.4 MG CAPS capsule Take 1 capsule (0.4 mg total) by mouth daily after supper. Patient taking differently: Take 0.8 mg by mouth daily after supper. 05/18/18  Yes Debbe Odea, MD  umeclidinium bromide (INCRUSE ELLIPTA) 62.5 MCG/INH AEPB Inhale 1 puff into the lungs daily. 09/22/19  Yes Mercy Riding, MD  vitamin B-12 (CYANOCOBALAMIN) 500 MCG tablet Take 500 mcg by mouth 2 (two) times daily.   Yes [provider]  albuterol (VENTOLIN HFA) 108 (90 Base) MCG/ACT inhaler Inhale 2 puffs into the lungs every 6 (six) hours as needed for wheezing or shortness of breath. 09/21/19   Mercy Riding, MD  feeding supplement, ENSURE ENLIVE, (ENSURE ENLIVE) LIQD Take 237 mLs by mouth 2 (two) times daily between meals. Patient not taking: Reported on 01/23/2020 11/30/19   Thurnell Lose, MD   Physical Exam: Vitals:    02/17/21 1358 02/17/21 1359 02/17/21 1700  BP: 101/65  (!) 124/56  Pulse: 95  75  Resp: 19  17  Temp: 97.8 F (36.6 C)    TempSrc: Oral    SpO2: 99%  94%  Weight:  66 kg    Constitutional: appears frail, cachectic, thin, malnourished, NAD, calm, comfortable Eyes: PERRL, lids and conjunctivae normal ENMT: Mucous membranes are moist. Posterior pharynx clear of any exudate or lesions.  Poor dentition. Hearing appropriate Neck: normal, supple, no masses, no thyromegaly Respiratory: clear to auscultation bilaterally, no wheezing, no crackles. Normal respiratory effort. No accessory muscle use.  Cardiovascular: Regular rate and rhythm, no murmurs / rubs / gallops. No extremity edema. 2+ pedal pulses. No carotid bruits.  Abdomen:  Scaphoid abdomen, no tenderness, no masses palpated, no hepatosplenomegaly. Bowel sounds positive.  Musculoskeletal: no clubbing / cyanosis. No joint deformity upper and lower extremities. Good ROM, no contractures.  Generalized decreased muscle/atrophy. normal muscle tone.  Skin: no rashes, lesions, ulcers. No induration Neurologic: Sensation intact. Strength 5/5 in all 4.  Psychiatric: Normal judgment and insight. Alert and oriented x 3.  Agitated mood.   EKG: independently reviewed, showing sinus rhythm with rate of 82, QTc 450  Chest x-ray on Admission: I personally reviewed and I agree with radiologist reading as below.  DG Chest 2 View  Result Date: 02/17/2021 CLINICAL DATA:  Weakness. EXAM: CHEST - 2 VIEW COMPARISON:  01/30/2020 FINDINGS: Previous median sternotomy and CABG procedure. Stable cardiomediastinal contours. Lung volumes are low. There is extensive bilateral lower lung zone predominant diffuse interstitial reticular opacities compatible with fibrotic lung disease. Superimposed airspace opacity is identified within the left lower lobe posteriorly. No pleural effusion or edema. Degenerative disc disease noted within the thoracic spine. IMPRESSION: 1.  Chronic interstitial lung disease. 2. Superimposed airspace opacity within the left lower lobe compatible with pneumonia. Followup PA and lateral chest X-ray is recommended in 3-4 weeks following trial of antibiotic therapy to ensure resolution and exclude underlying malignancy. Electronically Signed   By: Kerby Moors M.D.   On: 02/17/2021 14:29    Labs on Admission: I have personally reviewed following labs  CBC: Recent Labs  Lab 02/17/21 1343  WBC 17.9*  HGB 7.4*  HCT 24.2*  MCV 84.0  PLT 644   Basic Metabolic Panel: Recent Labs  Lab 02/17/21 1343  NA 134*  K 5.5*  CL 104  CO2 19*  GLUCOSE 152*  BUN 61*  CREATININE 2.22*  CALCIUM 9.1   GFR: CrCl cannot be calculated (Unknown ideal weight.).  Liver Function Tests: Recent Labs  Lab 02/17/21 1343  AST 48*  ALT 31  ALKPHOS 210*  BILITOT 1.6*  PROT 6.6  ALBUMIN 2.1*   Urine analysis:    Component Value Date/Time   COLORURINE YELLOW (A) 02/17/2021 1516   APPEARANCEUR CLOUDY (A) 02/17/2021 1516   LABSPEC 1.014 02/17/2021 1516   PHURINE 5.0 02/17/2021 1516   GLUCOSEU NEGATIVE 02/17/2021 1516   HGBUR MODERATE (A) 02/17/2021 1516   BILIRUBINUR NEGATIVE 02/17/2021 1516   KETONESUR NEGATIVE 02/17/2021 1516   PROTEINUR NEGATIVE 02/17/2021 1516   NITRITE NEGATIVE 02/17/2021 1516   LEUKOCYTESUR NEGATIVE 02/17/2021 1516   Dr. Tobie Poet [facility]  If 7PM-7AM, please contact overnight-coverage provider If 7AM-7PM, please contact day coverage provider [email or web address]  02/17/2021, 6:04 PM

## 2021-02-17 NOTE — ED Notes (Signed)
GuardianMariann Laster [phone]

## 2021-02-17 NOTE — ED Provider Notes (Signed)
[facility] Emergency Department Provider Note  Time seen: 2:01 PM  I have reviewed the triage vital signs and the nursing notes.   HISTORY  Chief Complaint Weakness   HPI Robert Little is a 80 y.o. male with a past medical history of anemia, CHF, CKD, CAD, diabetes, gastric reflux, hypertension, schizophrenia, presents to the emergency department with increased weakness.  According to EMS report the patient's nursing facility states that the patient has been more lethargic and fatigued than typical.  Patient denies any pain.  However most of the patient's speech is somewhat mumbling and difficult to understand.  It is not entirely clear what the patient's baseline is.  He appears somewhat frail and cachectic.   Past Medical History:  Diagnosis Date   Anemia    Chronic diastolic CHF (congestive heart failure) (HCC)    Chronic respiratory failure (HCC)    CKD (chronic kidney disease), stage IV ([place])    Coronary artery disease    CABG 2005 at [facility]   COVID-19 11/2019   pneumonia   Diabetes mellitus ([place])    Dysphagia    GERD (gastroesophageal reflux disease)    Hypertension    Lung nodule    Pulmonary fibrosis ([place])    Schizo affective schizophrenia ([place])    Tobacco abuse     Patient Active Problem List   Diagnosis Date Noted   Elevated troponin 01/26/2020   Paroxysmal atrial fibrillation ([place]) 01/26/2020   Pressure injury of skin 01/25/2020   Malnutrition of moderate degree 01/25/2020   Acute renal failure with acute renal cortical necrosis superimposed on stage 4 chronic kidney disease ([place])    HCAP (healthcare-associated pneumonia) 01/24/2020   Sepsis due to COVID-19 ([place]) 11/12/2019   Hypoglycemia 10/28/2019   Hyponatremia 10/28/2019   Acute on chronic respiratory failure with hypoxia ([place]) 09/17/2019   Positive D dimer 09/17/2019   Acute on chronic diastolic CHF (congestive heart failure) (HCC) 09/17/2019   Chronic diastolic CHF (congestive  heart failure), NYHA class 1 ([place]) 09/17/2019   Acute hypoxemic respiratory failure ([place]) 09/17/2019   Syncope 09/05/2019   Hypocalcemia 09/05/2019   Hypomagnesemia 09/05/2019   CKD (chronic kidney disease), stage IV ([place]) 09/05/2019   Acute urinary retention 05/18/2018   UTI (urinary tract infection) 05/15/2018   Hypokalemia 05/15/2018   AKI (acute kidney injury) ([place]) 05/15/2018   HLD (hyperlipidemia) 05/15/2018   Coronary artery disease involving native coronary artery of native heart without angina pectoris    Essential hypertension    Pulmonary fibrosis (HCC)    Schizo affective schizophrenia ([place])    Normocytic anemia     Past Surgical History:  Procedure Laterality Date   CORONARY ARTERY BYPASS GRAFT      Prior to Admission medications   Medication Sig Start Date End Date Taking? Authorizing Provider  acetaminophen (TYLENOL) 325 MG tablet Take 2 tablets (650 mg total) by mouth every 6 (six) hours as needed for mild pain (or Fever >/= 101). Patient taking differently: Take 650 mg by mouth every 6 (six) hours as needed for mild pain.  05/18/18   Debbe Odea, MD  albuterol (VENTOLIN HFA) 108 (90 Base) MCG/ACT inhaler Inhale 2 puffs into the lungs every 6 (six) hours as needed for wheezing or shortness of breath. 09/21/19   Mercy Riding, MD  aspirin EC 81 MG EC tablet Take 1 tablet (81 mg total) by mouth daily. 09/22/19   Mercy Riding, MD  atorvastatin (LIPITOR) 20 MG tablet Take 20 mg by  mouth at bedtime.    [provider]  clonazePAM (KLONOPIN) 0.5 MG tablet Take 1 tablet (0.5 mg total) by mouth 2 (two) times daily. 01/31/20   Ghimire, Henreitta Leber, MD  Emollient (EUCERIN SKIN CALMING) CREA Apply topically in the morning and at bedtime. To legs, arms and back    [provider]  feeding supplement, ENSURE ENLIVE, (ENSURE ENLIVE) LIQD Take 237 mLs by mouth 2 (two) times daily between meals. Patient not taking: Reported on 01/23/2020 11/30/19   Thurnell Lose, MD   ferrous sulfate 325 (65 FE) MG tablet Take 1 tablet (325 mg total) by mouth 2 (two) times daily with a meal. 09/21/19   Gonfa, Charlesetta Ivory, MD  fluticasone furoate-vilanterol (BREO ELLIPTA) 200-25 MCG/INH AEPB Inhale 1 puff into the lungs daily. 09/22/19   Mercy Riding, MD  guaiFENesin (MUCINEX) 600 MG 12 hr tablet Take 600 mg by mouth 2 (two) times daily.    [provider]  insulin aspart (NOVOLOG) 100 UNIT/ML injection 0-15 Units, Subcutaneous, 3 times daily with meals Correction coverage: Moderate (average weight, post-op) CBG < 70: Implement Hypoglycemia measures CBG 70 - 120: 0 units CBG 121 - 150: 2 units CBG 151 - 200: 3 units CBG 201 - 250: 5 units CBG 251 - 300: 8 units CBG 301 - 350: 11 units CBG 351 - 400: 15 units CBG > 400: call MD 01/31/20   Jonetta Osgood, MD  insulin glargine (LANTUS) 100 UNIT/ML injection Inject 0.16 mLs (16 Units total) into the skin daily. 02/01/20   Ghimire, Henreitta Leber, MD  ipratropium (ATROVENT) 0.03 % nasal spray Place 1 spray into the nose daily as needed for congestion. 11/02/19   [provider]  ipratropium-albuterol (DUONEB) 0.5-2.5 (3) MG/3ML SOLN Take 3 mLs by nebulization every 8 (eight) hours.    [provider]  magnesium oxide (MAG-OX) 400 MG tablet Take 400 mg by mouth in the morning, at noon, and at bedtime.    [provider]  Multiple Vitamin (MULTIVITAMIN WITH MINERALS) TABS tablet Take 1 tablet by mouth daily.    [provider]  pantoprazole (PROTONIX) 40 MG tablet Take 1 tablet (40 mg total) by mouth daily. 11/17/19   Thurnell Lose, MD  polyethylene glycol powder (MIRALAX) 17 GM/SCOOP powder Take 17 g by mouth 2 (two) times daily as needed for moderate constipation. 09/21/19   Mercy Riding, MD  predniSONE (DELTASONE) 20 MG tablet Take 2 tablets (40 mg total) by mouth daily with breakfast. 02/01/20   Ghimire, Henreitta Leber, MD  QUEtiapine (SEROQUEL) 25 MG tablet Take 1 tablet (25 mg total) by mouth  at bedtime. 11/30/19   Thurnell Lose, MD  tamsulosin (FLOMAX) 0.4 MG CAPS capsule Take 1 capsule (0.4 mg total) by mouth daily after supper. Patient taking differently: Take 0.8 mg by mouth daily after supper.  05/18/18   Debbe Odea, MD  umeclidinium bromide (INCRUSE ELLIPTA) 62.5 MCG/INH AEPB Inhale 1 puff into the lungs daily. 09/22/19   Mercy Riding, MD  vitamin B-12 (CYANOCOBALAMIN) 500 MCG tablet Take 500 mcg by mouth 2 (two) times daily.    [provider]    No Known Allergies  Family History  Problem Relation Age of Onset   Diabetes Mellitus II Mother    Diabetes Mellitus II Maternal Grandmother    Heart disease Maternal Grandmother     Social History Social History   Tobacco Use   Smoking status: Former    Types:  Cigarettes   Smokeless tobacco: Never  Vaping Use   Vaping Use: Never used  Substance Use Topics   Alcohol use: Not Currently   Drug use: Not Currently    Review of Systems Per patient and report Constitutional: Negative for fever. Cardiovascular: Negative for chest pain. Respiratory: Negative for shortness of breath. Gastrointestinal: Negative for abdominal pain Genitourinary: Negative for urinary compaints All other ROS negative  ____________________________________________   PHYSICAL EXAM:  VITAL SIGNS: ED Triage Vitals  Enc Vitals Group     BP 02/17/21 1358 101/65     Pulse Rate 02/17/21 1358 95     Resp 02/17/21 1358 19     Temp 02/17/21 1358 97.8 F (36.6 C)     Temp Source 02/17/21 1358 Oral     SpO2 02/17/21 1358 99 %     Weight 02/17/21 1359 145 lb 8.1 oz (66 kg)     Height --      Head Circumference --      Peak Flow --      Pain Score --      Pain Loc --      Pain Edu? --      Excl. in [place]? --     Constitutional: Patient is awake, lying in bed, appears frail and cachectic.  Will answer yes/no questions, however when he attempts to speak longer sentences it is largely mumbling speech difficult to understand.   Unclear baseline. Eyes: Normal exam ENT      Head: Normocephalic and atraumatic.      Mouth/Throat:  Dry appearing mucous membranes. Cardiovascular: Normal rate, regular rhythm.  Respiratory: Normal respiratory effort without tachypnea nor retractions. Breath sounds are clear  Gastrointestinal: Soft and nontender. No distention. Musculoskeletal: Nontender with normal range of motion in all extremities.  Neurologic: Somewhat mumbling speech.  Patient moves all extremities. Skin:  Skin is warm, dry and intact.  Psychiatric: Mood and affect are normal.   ____________________________________________    EKG  EKG viewed and interpreted by myself shows a normal sinus rhythm 82 bpm with a narrow QRS, normal axis, normal intervals, no concerning ST changes.  ____________________________________________    RADIOLOGY  Chest x-ray shows left lower lobe pneumonia.  ____________________________________________   INITIAL IMPRESSION / ASSESSMENT AND PLAN / ED COURSE  Pertinent labs & imaging results that were available during my care of the patient were reviewed by me and considered in my medical decision making (see chart for details).   Patient presents emergency department for increased weakness/lethargy compared to his baseline per report.  Here the patient is awake, will answer yes/no questions, largely has mumbling speech difficult to understand anything he is saying.  Unsure of what the patient's baseline is.  Given the report of increased weakness/lethargy we will check labs, COVID, urinalysis, CT scan of the head.  Patient does have dry mucous membranes we will dose IV fluids while awaiting results.  Vital signs are reassuring.  Afebrile.  Patient's work-up today does show an elevated white blood cell count of 17,000 however he does have somewhat of a chronically elevated white blood cell count.  Troponin somewhat elevated at 41 but again chronically elevated as well.  Patient's  potassium is elevated 5.5.  Patient receiving IV fluids and we will dose a one-time dose of [place] as well.  No EKG changes.  Anemia appears to be chronic.  CKD is chronic.  Given the patient's increased fatigue weakness chest x-ray showing pneumonia we will admit for antibiotics and send  blood cultures.  Urinalysis pending.  Patient admitted to the hospital service for further work-up and treatment.  Robert Little was evaluated in Emergency Department on 02/17/2021 for the symptoms described in the history of present illness. He was evaluated in the context of the global COVID-19 pandemic, which necessitated consideration that the patient might be at risk for infection with the SARS-CoV-2 virus that causes COVID-19. Institutional protocols and algorithms that pertain to the evaluation of patients at risk for COVID-19 are in a state of rapid change based on information released by regulatory bodies including the CDC and federal and state organizations. These policies and algorithms were followed during the patient's care in the ED.  ____________________________________________   FINAL CLINICAL IMPRESSION(S) / ED DIAGNOSES  Weakness Pneumonia Hyperkalemia   Harvest Dark, MD 02/17/21 1504

## 2021-02-17 NOTE — Plan of Care (Signed)

## 2021-02-17 NOTE — ED Triage Notes (Signed)
Patient presents via acems with c/o weakness. Facility reported to ems that patient has been more lethargic than normal. Upon arrival patient able to tell this RN name. Pts legal guardian updated that patient is here and being seen. MD Paduchowski at bedside.

## 2021-02-17 NOTE — ED Notes (Signed)
Pt placed on bedpan due to pt stating "I need to shit". Pt taken off bed pan, no stool present but pt did urinate.

## 2021-02-18 DIAGNOSIS — E43 Unspecified severe protein-calorie malnutrition: Secondary | ICD-10-CM | POA: Insufficient documentation

## 2021-02-18 DIAGNOSIS — R7881 Bacteremia: Secondary | ICD-10-CM | POA: Diagnosis not present

## 2021-02-18 DIAGNOSIS — I248 Other forms of acute ischemic heart disease: Secondary | ICD-10-CM | POA: Diagnosis not present

## 2021-02-18 DIAGNOSIS — I5032 Chronic diastolic (congestive) heart failure: Secondary | ICD-10-CM | POA: Diagnosis present

## 2021-02-18 DIAGNOSIS — A419 Sepsis, unspecified organism: Secondary | ICD-10-CM | POA: Diagnosis not present

## 2021-02-18 DIAGNOSIS — I2510 Atherosclerotic heart disease of native coronary artery without angina pectoris: Secondary | ICD-10-CM | POA: Diagnosis present

## 2021-02-18 DIAGNOSIS — J8410 Pulmonary fibrosis, unspecified: Secondary | ICD-10-CM | POA: Diagnosis present

## 2021-02-18 DIAGNOSIS — N184 Chronic kidney disease, stage 4 (severe): Secondary | ICD-10-CM | POA: Diagnosis present

## 2021-02-18 DIAGNOSIS — C3431 Malignant neoplasm of lower lobe, right bronchus or lung: Secondary | ICD-10-CM | POA: Diagnosis present

## 2021-02-18 DIAGNOSIS — J189 Pneumonia, unspecified organism: Secondary | ICD-10-CM | POA: Diagnosis present

## 2021-02-18 DIAGNOSIS — Z8616 Personal history of COVID-19: Secondary | ICD-10-CM | POA: Diagnosis not present

## 2021-02-18 DIAGNOSIS — Z66 Do not resuscitate: Secondary | ICD-10-CM | POA: Diagnosis present

## 2021-02-18 DIAGNOSIS — I130 Hypertensive heart and chronic kidney disease with heart failure and stage 1 through stage 4 chronic kidney disease, or unspecified chronic kidney disease: Secondary | ICD-10-CM | POA: Diagnosis present

## 2021-02-18 DIAGNOSIS — R64 Cachexia: Secondary | ICD-10-CM | POA: Diagnosis present

## 2021-02-18 DIAGNOSIS — N1832 Chronic kidney disease, stage 3b: Secondary | ICD-10-CM | POA: Diagnosis not present

## 2021-02-18 DIAGNOSIS — K219 Gastro-esophageal reflux disease without esophagitis: Secondary | ICD-10-CM | POA: Diagnosis present

## 2021-02-18 DIAGNOSIS — R54 Age-related physical debility: Secondary | ICD-10-CM | POA: Diagnosis present

## 2021-02-18 DIAGNOSIS — D631 Anemia in chronic kidney disease: Secondary | ICD-10-CM | POA: Diagnosis present

## 2021-02-18 DIAGNOSIS — E1122 Type 2 diabetes mellitus with diabetic chronic kidney disease: Secondary | ICD-10-CM | POA: Diagnosis present

## 2021-02-18 DIAGNOSIS — R7989 Other specified abnormal findings of blood chemistry: Secondary | ICD-10-CM | POA: Diagnosis not present

## 2021-02-18 DIAGNOSIS — C786 Secondary malignant neoplasm of retroperitoneum and peritoneum: Secondary | ICD-10-CM | POA: Diagnosis present

## 2021-02-18 DIAGNOSIS — C7951 Secondary malignant neoplasm of bone: Secondary | ICD-10-CM | POA: Diagnosis present

## 2021-02-18 DIAGNOSIS — D72829 Elevated white blood cell count, unspecified: Secondary | ICD-10-CM | POA: Diagnosis present

## 2021-02-18 DIAGNOSIS — Z20822 Contact with and (suspected) exposure to covid-19: Secondary | ICD-10-CM | POA: Diagnosis present

## 2021-02-18 DIAGNOSIS — I480 Paroxysmal atrial fibrillation: Secondary | ICD-10-CM | POA: Diagnosis present

## 2021-02-18 DIAGNOSIS — C787 Secondary malignant neoplasm of liver and intrahepatic bile duct: Secondary | ICD-10-CM | POA: Diagnosis present

## 2021-02-18 DIAGNOSIS — F259 Schizoaffective disorder, unspecified: Secondary | ICD-10-CM | POA: Diagnosis present

## 2021-02-18 DIAGNOSIS — C7989 Secondary malignant neoplasm of other specified sites: Secondary | ICD-10-CM | POA: Diagnosis present

## 2021-02-18 DIAGNOSIS — Z515 Encounter for palliative care: Secondary | ICD-10-CM | POA: Diagnosis not present

## 2021-02-18 DIAGNOSIS — I38 Endocarditis, valve unspecified: Secondary | ICD-10-CM | POA: Diagnosis not present

## 2021-02-18 LAB — BLOOD CULTURE ID PANEL (REFLEXED) - BCID2

## 2021-02-18 LAB — CBC
HCT: 21 % — ABNORMAL LOW (ref 39.0–52.0)
Hemoglobin: 6.4 g/dL — ABNORMAL LOW (ref 13.0–17.0)
MCH: 25.6 pg — ABNORMAL LOW (ref 26.0–34.0)
MCHC: 30.5 g/dL (ref 30.0–36.0)
MCV: 84 fL (ref 80.0–100.0)
Platelets: 355 10*3/uL (ref 150–400)
RBC: 2.5 MIL/uL — ABNORMAL LOW (ref 4.22–5.81)
RDW: 15.4 % (ref 11.5–15.5)
WBC: 19.1 10*3/uL — ABNORMAL HIGH (ref 4.0–10.5)
nRBC: 0 % (ref 0.0–0.2)

## 2021-02-18 LAB — RETICULOCYTES
Immature Retic Fract: 31.1 % — ABNORMAL HIGH (ref 2.3–15.9)
RBC.: 2.5 MIL/uL — ABNORMAL LOW (ref 4.22–5.81)
Retic Count, Absolute: 50.3 10*3/uL (ref 19.0–186.0)
Retic Ct Pct: 2 % (ref 0.4–3.1)

## 2021-02-18 LAB — BASIC METABOLIC PANEL
Anion gap: 7 (ref 5–15)
BUN: 53 mg/dL — ABNORMAL HIGH (ref 8–23)
CO2: 21 mmol/L — ABNORMAL LOW (ref 22–32)
Calcium: 8.6 mg/dL — ABNORMAL LOW (ref 8.9–10.3)
Chloride: 110 mmol/L (ref 98–111)
Creatinine, Ser: 1.88 mg/dL — ABNORMAL HIGH (ref 0.61–1.24)
GFR, Estimated: 36 mL/min — ABNORMAL LOW (ref >60)
Glucose, Bld: 62 mg/dL — ABNORMAL LOW (ref 70–99)
Potassium: 3.9 mmol/L (ref 3.5–5.1)
Sodium: 138 mmol/L (ref 135–145)

## 2021-02-18 LAB — IRON AND TIBC
Iron: 15 ug/dL — ABNORMAL LOW (ref 45–182)
Saturation Ratios: 13 % — ABNORMAL LOW (ref 17.9–39.5)
TIBC: 120 ug/dL — ABNORMAL LOW (ref 250–450)
UIBC: 105 ug/dL

## 2021-02-18 LAB — FOLATE: Folate: 9.8 ng/mL (ref >5.9)

## 2021-02-18 LAB — HEPATIC FUNCTION PANEL
ALT: 27 U/L (ref 0–44)
AST: 30 U/L (ref 15–41)
Albumin: 2 g/dL — ABNORMAL LOW (ref 3.5–5.0)
Alkaline Phosphatase: 200 U/L — ABNORMAL HIGH (ref 38–126)
Bilirubin, Direct: 0.2 mg/dL (ref 0.0–0.2)
Indirect Bilirubin: 0.6 mg/dL (ref 0.3–0.9)
Total Bilirubin: 0.8 mg/dL (ref 0.3–1.2)
Total Protein: 5.9 g/dL — ABNORMAL LOW (ref 6.5–8.1)

## 2021-02-18 LAB — PREPARE RBC (CROSSMATCH)

## 2021-02-18 LAB — VITAMIN B12: Vitamin B-12: 2349 pg/mL — ABNORMAL HIGH (ref 180–914)

## 2021-02-18 LAB — GLUCOSE, CAPILLARY
Glucose-Capillary: 66 mg/dL — ABNORMAL LOW (ref 70–99)
Glucose-Capillary: 103 mg/dL — ABNORMAL HIGH (ref 70–99)
Glucose-Capillary: 161 mg/dL — ABNORMAL HIGH (ref 70–99)
Glucose-Capillary: 187 mg/dL — ABNORMAL HIGH (ref 70–99)
Glucose-Capillary: 162 mg/dL — ABNORMAL HIGH (ref 70–99)

## 2021-02-18 LAB — FERRITIN: Ferritin: 2264 ng/mL — ABNORMAL HIGH (ref 24–336)

## 2021-02-18 LAB — OCCULT BLOOD X 1 CARD TO LAB, STOOL: Fecal Occult Bld: NEGATIVE

## 2021-02-18 MED ORDER — TAMSULOSIN HCL 0.4 MG PO CAPS
0.4000 mg | ORAL_CAPSULE | Freq: Once | ORAL | Status: AC
  Administered 2021-02-18: 0.4 mg via ORAL
  Filled 2021-02-18: qty 1

## 2021-02-18 MED ORDER — SODIUM CHLORIDE 0.9 % IV BOLUS
250.0000 mL | Freq: Once | INTRAVENOUS | Status: AC
  Administered 2021-02-18: 250 mL via INTRAVENOUS

## 2021-02-18 MED ORDER — IPRATROPIUM-ALBUTEROL 0.5-2.5 (3) MG/3ML IN SOLN
3.0000 mL | Freq: Three times a day (TID) | RESPIRATORY_TRACT | Status: DC
  Administered 2021-02-18 (×2): 3 mL via RESPIRATORY_TRACT
  Filled 2021-02-18 (×2): qty 3

## 2021-02-18 MED ORDER — POLYETHYLENE GLYCOL 3350 17 G PO PACK
17.0000 g | PACK | Freq: Two times a day (BID) | ORAL | Status: DC | PRN
  Filled 2021-02-18: qty 1

## 2021-02-18 MED ORDER — SODIUM CHLORIDE 0.9% IV SOLUTION: Freq: Once | INTRAVENOUS | Status: AC

## 2021-02-18 MED ORDER — ENSURE ENLIVE PO LIQD
237.0000 mL | Freq: Three times a day (TID) | ORAL | Status: DC
  Administered 2021-02-18 – 2021-02-24 (×12): 237 mL via ORAL

## 2021-02-18 MED ORDER — METOPROLOL TARTRATE 5 MG/5ML IV SOLN
2.5000 mg | Freq: Once | INTRAVENOUS | Status: AC
  Administered 2021-02-18: 2.5 mg via INTRAVENOUS
  Filled 2021-02-18: qty 5

## 2021-02-18 MED ORDER — ACETAMINOPHEN 325 MG PO TABS
650.0000 mg | ORAL_TABLET | Freq: Once | ORAL | Status: AC
  Administered 2021-02-18: 650 mg via ORAL
  Filled 2021-02-18: qty 2

## 2021-02-18 MED ORDER — INSULIN GLARGINE-YFGN 100 UNIT/ML ~~LOC~~ SOLN
5.0000 [IU] | Freq: Every day | SUBCUTANEOUS | Status: DC
  Administered 2021-02-18 – 2021-02-23 (×5): 5 [IU] via SUBCUTANEOUS
  Filled 2021-02-18 (×7): qty 0.05

## 2021-02-18 NOTE — Progress Notes (Signed)
Initial Nutrition Assessment  DOCUMENTATION CODES:  Severe malnutrition in context of chronic illness  INTERVENTION:  Obtain updated weight.  Increase Ensure Plus from BID to TID.  Add Magic cup TID with meals, each supplement provides 290 kcal and 9 grams of protein.  Continue MVI with minerals daily.  NUTRITION DIAGNOSIS:  Severe Malnutrition related to chronic illness (schizophrenia) as evidenced by severe fat depletion, severe muscle depletion.  GOAL:  Patient will meet greater than or equal to 90% of their needs  MONITOR:  PO intake, Supplement acceptance, Labs, Weight trends, I & O's  REASON FOR ASSESSMENT:  Consult Assessment of nutrition requirement/status  ASSESSMENT:  80 yo male with a PMH of schizophrenia, insulin-dependent diabetes mellitus, anxiety, hyperlipidemia, GERD, hypertension, CAD, CKD 3/CKD 4, chronic anemia, presents to the emergency department from facility for chief concerns of weakness. Pt admitted with L lower lobe PNA.  Pt reports poor appetite due to a sore mouth on admission in H&P.  Pt unaware during RD visit and babbled incoherently during RD interview. Did not obtain any information regarding nutrition history.  Per Epic, pt's weight appears to be copied from previous admission over one year ago. RD to order measured weight to determine any weight changes.  On exam, pt severely depleted in all areas.  Recommend increasing Ensure Enlive from BID to TID, adding Magic Cup TID, and feeding assistance during meals if pt agreeable.  Medications: reviewed; EE BID, ferrous sulfate, SSI, bedtime Novolog, Semglee, MVI with minerals, Protonix, Vitamin B12, NaCl @ 100 ml/hr via IV  Labs: reviewed; 66-278 (L-H) HbA1c: 6.9% (02/17/21)  NUTRITION - FOCUSED PHYSICAL EXAM: Flowsheet Row Most Recent Value  Orbital Region Severe depletion  Upper Arm Region Severe depletion  Thoracic and Lumbar Region Severe depletion  Buccal Region Severe depletion   Temple Region Severe depletion  Clavicle Bone Region Severe depletion  Clavicle and Acromion Bone Region Severe depletion  Scapular Bone Region Severe depletion  Dorsal Hand Severe depletion  Patellar Region Severe depletion  Anterior Thigh Region Severe depletion  Posterior Calf Region Severe depletion  Edema (RD Assessment) None  Hair Reviewed  Eyes Reviewed  Mouth Reviewed  Skin Reviewed  Nails Reviewed   Diet Order:   Diet Order             Diet Heart Room service appropriate? Yes; Fluid consistency: Thin  Diet effective now                  EDUCATION NEEDS:  Not appropriate for education at this time  Skin:  Skin Assessment: Reviewed RN Assessment (Abrasion on buttocks)  Last BM:  02/17/21 - Type 4, medium  Height:  Ht Readings from Last 1 Encounters:  02/17/21 5\' 8"  (1.727 m)   Weight:  Wt Readings from Last 1 Encounters:  02/17/21 66 kg   BMI:  Body mass index is 22.12 kg/m.  Estimated Nutritional Needs:  Kcal:  2000-2200 Protein:  75-90 grams Fluid:  >2 L  Derrel Nip, RD, LDN (she/her/hers) Registered Dietitian I After-Hours/Weekend Pager # in [place]

## 2021-02-18 NOTE — TOC Progression Note (Signed)
Transition of Care [facility]) - Progression Note    Patient Details  Name: Robert Little MRN: [id number] Date of Birth: 1941-01-12  Transition of Care [facility]) CM/SW Contact  Su Hilt, RN Phone Number: 02/18/2021, 12:40 PM  Clinical Narrative:    Spoke with the patient's legal guardian Legrand Como, he stated that he is a long term resident at [facility] and the plan is to return to [facility] [place].         Expected Discharge Plan and Services                                                 Social Determinants of Health (SDOH) Interventions    Readmission Risk Interventions Readmission Risk Prevention Plan 11/23/2019 09/20/2019  Transportation Screening Complete Complete  PCP or Specialist Appt within 3-5 Days - Complete  HRI or [place] - Complete  Social Work Consult for [place] Planning/Counseling - Complete  Palliative Care Screening - Not Applicable  Medication Review Press photographer) Complete Complete  PCP or Specialist appointment within 3-5 days of discharge Complete -  [place] or Home Care Consult Complete -  SW Recovery Care/Counseling Consult Complete -  Palliative Care Screening Not Applicable -  Skilled Nursing Facility Complete -  Some recent data might be hidden

## 2021-02-18 NOTE — Progress Notes (Signed)
PROGRESS NOTE    Robert Little  OMV:[id number] DOB: Oct 22, 1940 DOA: 02/17/2021 PCP: Care, [name]    Brief Narrative:  Robert Little is a 80 y.o. male with medical history significant for schizophrenia, insulin-dependent diabetes mellitus, anxiety, hyperlipidemia, GERD, hypertension, CAD, CKD 3/CKD 4, chronic anemia, presents to the emergency department from facility for chief concerns of weakness.Patient is irritable and easily annoyed with questions and does not want to fully participate in physical exam  8/17- in foul mood this am. Yells back with any answers.Unable to obtain good history.BG 66 this am  Consultants:    Procedures:   Antimicrobials:      Subjective: Unable to tell me if sob, or cp. Or any other sx  Objective: Vitals:   02/17/21 2219 02/18/21 0002 02/18/21 0415 02/18/21 0739  BP:  (!) 101/49 (!) 96/47 126/61  Pulse:  (!) 110 74 91  Resp:  18 18 18   Temp:  98.6 F (37 C) 98 F (36.7 C) 98.4 F (36.9 C)  TempSrc:      SpO2: 98% 99% 100% 100%  Weight:        Intake/Output Summary (Last 24 hours) at 02/18/2021 0848 Last data filed at 02/18/2021 0300 Gross per 24 hour  Intake 1109.15 ml  Output 800 ml  Net 309.15 ml   Filed Weights   02/17/21 1359  Weight: 66 kg    Examination:  General exam: nad, comfortable Respiratory system: Poor respiratory effort, decreased breath sounds no wheezing  Cardiovascular system: S1 & S2 heard, RRR.  No gallop Gastrointestinal system: Abdomen is nondistended, soft and nontender.  Normal bowel sounds heard. Central nervous system: Alert and and awake.  Grossly intact Extremities: No edema Skin: Warm dry    Data Reviewed: I have personally reviewed following labs and imaging studies  CBC: Recent Labs  Lab 02/17/21 1343 02/18/21 0403  WBC 17.9* 19.1*  HGB 7.4* 6.4*  HCT 24.2* 21.0*  MCV 84.0 84.0  PLT 384 628   Basic Metabolic Panel: Recent Labs  Lab 02/17/21 1343 02/18/21 0403   NA 134* 138  K 5.5* 3.9  CL 104 110  CO2 19* 21*  GLUCOSE 152* 62*  BUN 61* 53*  CREATININE 2.22* 1.88*  CALCIUM 9.1 8.6*   GFR: CrCl cannot be calculated (Unknown ideal weight.). Liver Function Tests: Recent Labs  Lab 02/17/21 1343 02/18/21 0403  AST 48* 30  ALT 31 27  ALKPHOS 210* 200*  BILITOT 1.6* 0.8  PROT 6.6 5.9*  ALBUMIN 2.1* 2.0*   No results for input(s): LIPASE, AMYLASE in the last 168 hours. No results for input(s): AMMONIA in the last 168 hours. Coagulation Profile: No results for input(s): INR, PROTIME in the last 168 hours. Cardiac Enzymes: No results for input(s): CKTOTAL, CKMB, CKMBINDEX, TROPONINI in the last 168 hours. BNP (last 3 results) No results for input(s): PROBNP in the last 8760 hours. HbA1C: Recent Labs    02/17/21 1343  HGBA1C 6.9*   CBG: Recent Labs  Lab 02/17/21 2151 02/18/21 0814  GLUCAP 90 66*   Lipid Profile: No results for input(s): CHOL, HDL, LDLCALC, TRIG, CHOLHDL, LDLDIRECT in the last 72 hours. Thyroid Function Tests: No results for input(s): TSH, T4TOTAL, FREET4, T3FREE, THYROIDAB in the last 72 hours. Anemia Panel: Recent Labs    02/18/21 0403  FOLATE 9.8  FERRITIN 2,264*  TIBC 120*  IRON 15*  RETICCTPCT 2.0   Sepsis Labs: No results for input(s): PROCALCITON, LATICACIDVEN in the last 168 hours.  Recent  Results (from the past 240 hour(s))  Resp Panel by RT-PCR (Flu A&B, Covid) Nasopharyngeal Swab     Status: None   Collection Time: 02/17/21  2:11 PM   Specimen: Nasopharyngeal Swab; Nasopharyngeal(NP) swabs in vial transport medium  Result Value Ref Range Status   SARS Coronavirus 2 by RT PCR NEGATIVE NEGATIVE Final    Comment: (NOTE) SARS-CoV-2 target nucleic acids are NOT DETECTED.  The SARS-CoV-2 RNA is generally detectable in upper respiratory specimens during the acute phase of infection. The lowest concentration of SARS-CoV-2 viral copies this assay can detect is 138 copies/mL. A negative result  does not preclude SARS-Cov-2 infection and should not be used as the sole basis for treatment or other patient management decisions. A negative result may occur with  improper specimen collection/handling, submission of specimen other than nasopharyngeal swab, presence of viral mutation(s) within the areas targeted by this assay, and inadequate number of viral copies(<138 copies/mL). A negative result must be combined with clinical observations, patient history, and epidemiological information. The expected result is Negative.  Fact Sheet for Patients:  EntrepreneurPulse.com.au  Fact Sheet for Healthcare Providers:  IncredibleEmployment.be  This test is no t yet approved or cleared by the [place] FDA and  has been authorized for detection and/or diagnosis of SARS-CoV-2 by FDA under an Emergency Use Authorization (EUA). This EUA will remain  in effect (meaning this test can be used) for the duration of the COVID-19 declaration under Section 564(b)(1) of the Act, 21 U.S.C.section 360bbb-3(b)(1), unless the authorization is terminated  or revoked sooner.       Influenza A by PCR NEGATIVE NEGATIVE Final   Influenza B by PCR NEGATIVE NEGATIVE Final    Comment: (NOTE) The Xpert Xpress SARS-CoV-2/FLU/RSV plus assay is intended as an aid in the diagnosis of influenza from Nasopharyngeal swab specimens and should not be used as a sole basis for treatment. Nasal washings and aspirates are unacceptable for Xpert Xpress SARS-CoV-2/FLU/RSV testing.  Fact Sheet for Patients: EntrepreneurPulse.com.au  Fact Sheet for Healthcare Providers: IncredibleEmployment.be  This test is not yet approved or cleared by the [place] FDA and has been authorized for detection and/or diagnosis of SARS-CoV-2 by FDA under an Emergency Use Authorization (EUA). This EUA will remain in effect (meaning this test can be used) for  the duration of the COVID-19 declaration under Section 564(b)(1) of the Act, 21 U.S.C. section 360bbb-3(b)(1), unless the authorization is terminated or revoked.  Performed at [facility], [address] [place], [place] [address]   Blood culture (routine x 2)     Status: None (Preliminary result)   Collection Time: 02/17/21  3:19 PM   Specimen: BLOOD  Result Value Ref Range Status   Specimen Description BLOOD RIGHT ANTECUBITAL  Final   Special Requests   Final    BOTTLES DRAWN AEROBIC AND ANAEROBIC Blood Culture results may not be optimal due to an inadequate volume of blood received in culture bottles   Culture  Setup Time   Final    Organism ID to follow [place] Performed at [facility], [address] [place], [place] [address]    Culture [facility] POSITIVE COCCI  Final   Report Status PENDING  Incomplete  Blood culture (routine x 2)     Status: None (Preliminary result)   Collection Time: 02/17/21  3:25 PM   Specimen: BLOOD  Result Value Ref Range Status   Specimen Description BLOOD BLOOD LEFT HAND  Final  Special Requests   Final    BOTTLES DRAWN AEROBIC ONLY Blood Culture results may not be optimal due to an inadequate volume of blood received in culture bottles   Culture   Final    NO GROWTH < 24 HOURS Performed at [facility], [address] [place], [place] [address]    Report Status PENDING  Incomplete         Radiology Studies: DG Chest 2 View  Result Date: 02/17/2021 CLINICAL DATA:  Weakness. EXAM: CHEST - 2 VIEW COMPARISON:  01/30/2020 FINDINGS: Previous median sternotomy and CABG procedure. Stable cardiomediastinal contours. Lung volumes are low. There is extensive bilateral lower lung zone predominant diffuse interstitial reticular opacities compatible with fibrotic lung disease. Superimposed airspace opacity is identified within the left lower lobe posteriorly. No pleural effusion or edema.  Degenerative disc disease noted within the thoracic spine. IMPRESSION: 1. Chronic interstitial lung disease. 2. Superimposed airspace opacity within the left lower lobe compatible with pneumonia. Followup PA and lateral chest X-ray is recommended in 3-4 weeks following trial of antibiotic therapy to ensure resolution and exclude underlying malignancy. Electronically Signed   By: Kerby Moors M.D.   On: 02/17/2021 14:29   US Abdomen Limited RUQ (LIVER/GB)  Result Date: 02/17/2021 CLINICAL DATA:  Elevated liver function test. EXAM: ULTRASOUND ABDOMEN LIMITED RIGHT UPPER QUADRANT COMPARISON:  None. FINDINGS: Gallbladder: No gallstones or wall thickening visualized (2.0 mm). No sonographic Murphy sign noted by sonographer. Common bile duct: Diameter: 2.3 mm Liver: An 8.8 cm x 8.4 cm x 8.4 cm heterogeneous liver mass is seen within the right lobe of the liver. There is diffusely increased echogenicity of the liver parenchyma. Portal vein is patent on color Doppler imaging with normal direction of blood flow towards the liver. Other: None. IMPRESSION: 1. Large heterogeneous liver mass, worrisome for the presence of an underlying neoplasm. Further evaluation with hepatic MRI is recommended. Electronically Signed   By: Virgina Norfolk M.D.   On: 02/17/2021 20:05        Scheduled Meds:  aspirin EC  81 mg Oral Daily   atorvastatin  20 mg Oral QHS   dextromethorphan-guaiFENesin  1 tablet Oral BID   feeding supplement  237 mL Oral BID BM   ferrous sulfate  325 mg Oral BID WC   fluticasone furoate-vilanterol  1 puff Inhalation Daily   heparin injection (subcutaneous)  5,000 Units Subcutaneous Q8H   insulin aspart  0-5 Units Subcutaneous QHS   insulin aspart  0-9 Units Subcutaneous TID WC   insulin glargine-yfgn  10 Units Subcutaneous QHS   ipratropium-albuterol  3 mL Nebulization Q8H   multivitamin with minerals  1 tablet Oral Daily   pantoprazole  40 mg Oral Daily   patiromer  8.4 g Oral Daily    QUEtiapine  25 mg Oral Daily   tamsulosin  0.4 mg Oral QPC supper   umeclidinium bromide  1 puff Inhalation Daily   vitamin B-12  500 mcg Oral BID   Continuous Infusions:  sodium chloride 100 mL/hr at 02/18/21 0545   azithromycin     cefTRIAXone (ROCEPHIN)  IV      Assessment & Plan:   Principal Problem:   Left lower lobe pneumonia Active Problems:   Essential hypertension   Schizo affective schizophrenia ([place])   HLD (hyperlipidemia)   CKD (chronic kidney disease), stage IV (HCC)   Malnutrition of moderate degree   Elevated troponin   Paroxysmal atrial fibrillation (HCC)   Neutrophilic leukocytosis   Anemia  due to chronic kidney disease   Anemia due to stage 4 chronic kidney disease (HCC)   Elevated LFTs   # Left lower lobe pneumonia per chest x-ray read - Patient did endorse cough with me, unclear if it is productive - COVID/influenza A/influenza B PCR were negative 8/17 continue IV ceftriaxone and azithromycin    # Acute on chronic anemia - Hemoglobin ranged of 7.7-8.6 in 2021 - CBC in the a.m. - Patient denies nausea vomiting, black stool, bleeding and is otherwise hemodynamically stable 8/17- continue iron pills Ck stool occult Hg 6.4, spoke to legal guardian [name] and discussed transfusion and possible complications and he is agreeable. Told me pt was transfused last year without any issues. Will transfuse 1prbc, ck post h/h, if <7 will give a second unit DC heparin subcu and aspirin until GI bleed ruled out   # History of chronic leukocytosis - WBC has been chronically elevated in 2021 - This is the highest level its been 8/17- mildly up, likely from infection Continue to monitor    # Elevated liver enzymes Improved today.   # Severe protein/calorie malnutrition-resumed Ensure supplements - Consult placed to registered dietitian  # Elevated troponin with downtime trending delta troponin - Patient denies chest pain and shortness of breath at  this time - Low clinical suspicion for ACS   # Insulin-dependent diabetes mellitus-I suspect this is type II - Resumed home long-acting 10 units nightly - Insulin SSI with at bedtime coverage ordered - Check A1c    # Query dementia # Schizophrenia with schizoaffective disorder - Resumed home quetiapine 25 mg p.o. daily   # Anxiety-resume Klonopin 0.25 mg p.o. twice daily as needed for anxiety      DVT prophylaxis: SCD Code Status: DNR Family Communication: [name] legal guardian updated Disposition Plan:  Status is: Observation  The patient remains OBS appropriate and will d/c before 2 midnights.  Dispo: The patient is from: Group home              Anticipated d/c is to: Group home              Patient currently is not medically stable to d/c.   Difficult to place patient No  Patient requires transfusion PRBC and being treated with IV antibiotics for pneumonia          LOS: 0 days   Time spent: 35 minutes with more than 50% on [place], MD [facility] Pager [phone]  If 7PM-7AM, please contact night-coverage 02/18/2021, 8:48 AM

## 2021-02-18 NOTE — Progress Notes (Signed)
PHARMACY - PHYSICIAN COMMUNICATION CRITICAL VALUE ALERT - BLOOD CULTURE IDENTIFICATION (BCID)  Robert Little is an 80 y.o. male who presented to [facility] on 02/17/2021 with a chief complaint of weakness, lethargy, cough  Assessment:  8/16 blood cx with GPC in 1 of 4 bottles, BCID - nothing detected.  On antibiotics for CAP.  Name of physician (or Provider) Contacted: Dr Kurtis Bushman  Current antibiotics: Ceftriaxone/azithromcyin  Changes to prescribed antibiotics recommended:  Patient is on recommended antibiotics - No changes needed.  Monitor with current antibiotics. Suspect blood cx is contaminant  Results for orders placed or performed during the hospital encounter of 02/17/21  Blood Culture ID Panel (Reflexed) (Collected: 02/17/2021  3:19 PM)  Result Value Ref Range   Enterococcus faecalis NOT DETECTED NOT DETECTED   Enterococcus Faecium NOT DETECTED NOT DETECTED   Listeria monocytogenes NOT DETECTED NOT DETECTED   Staphylococcus species NOT DETECTED NOT DETECTED   Staphylococcus aureus (BCID) NOT DETECTED NOT DETECTED   Staphylococcus epidermidis NOT DETECTED NOT DETECTED   Staphylococcus lugdunensis NOT DETECTED NOT DETECTED   Streptococcus species NOT DETECTED NOT DETECTED   Streptococcus agalactiae NOT DETECTED NOT DETECTED   Streptococcus pneumoniae NOT DETECTED NOT DETECTED   Streptococcus pyogenes NOT DETECTED NOT DETECTED   A.calcoaceticus-baumannii NOT DETECTED NOT DETECTED   Bacteroides fragilis NOT DETECTED NOT DETECTED   Enterobacterales NOT DETECTED NOT DETECTED   Enterobacter cloacae complex NOT DETECTED NOT DETECTED   Escherichia coli NOT DETECTED NOT DETECTED   Klebsiella aerogenes NOT DETECTED NOT DETECTED   Klebsiella oxytoca NOT DETECTED NOT DETECTED   Klebsiella pneumoniae NOT DETECTED NOT DETECTED   Proteus species NOT DETECTED NOT DETECTED   Salmonella species NOT DETECTED NOT DETECTED   Serratia marcescens NOT DETECTED NOT DETECTED   Haemophilus influenzae  NOT DETECTED NOT DETECTED   Neisseria meningitidis NOT DETECTED NOT DETECTED   Pseudomonas aeruginosa NOT DETECTED NOT DETECTED   Stenotrophomonas maltophilia NOT DETECTED NOT DETECTED   Candida albicans NOT DETECTED NOT DETECTED   Candida auris NOT DETECTED NOT DETECTED   Candida glabrata NOT DETECTED NOT DETECTED   Candida krusei NOT DETECTED NOT DETECTED   Candida parapsilosis NOT DETECTED NOT DETECTED   Candida tropicalis NOT DETECTED NOT DETECTED   Cryptococcus neoformans/gattii NOT DETECTED NOT DETECTED    Doreene Eland, PharmD, BCPS.   Work Cell: [phone] 02/18/2021 9:23 AM

## 2021-02-18 NOTE — Progress Notes (Signed)
Pt  experienced tachycardia during the night, two small loose BM and one formed BM. Pt also did not void 6 hrs after arriving to the unit. Performed bladder scan, result 677 ml.  Contacted HCP, ordered straight cath, Flomax, Lopressor 2.5 mg IV, and 250 ml bolus.  Pt resting comfortably.

## 2021-02-18 NOTE — Progress Notes (Signed)
Hypoglycemic Event  CBG 66  Treatment: 4 Oz Orange Juice  Symptoms: None  Follow-up CBG: [id number]:[id number]  CBG Result:103  Possible Reasons for Event: Inadequate meal intake  Comments/MD notified:Dr Amery    Robert Little

## 2021-02-19 DIAGNOSIS — R7989 Other specified abnormal findings of blood chemistry: Secondary | ICD-10-CM | POA: Diagnosis not present

## 2021-02-19 DIAGNOSIS — J189 Pneumonia, unspecified organism: Secondary | ICD-10-CM | POA: Diagnosis not present

## 2021-02-19 DIAGNOSIS — N1832 Chronic kidney disease, stage 3b: Secondary | ICD-10-CM | POA: Diagnosis not present

## 2021-02-19 DIAGNOSIS — N184 Chronic kidney disease, stage 4 (severe): Secondary | ICD-10-CM | POA: Diagnosis not present

## 2021-02-19 LAB — TYPE AND SCREEN
ABO/RH(D): O POS
Antibody Screen: NEGATIVE
Unit division: 0

## 2021-02-19 LAB — GLUCOSE, CAPILLARY
Glucose-Capillary: 105 mg/dL — ABNORMAL HIGH (ref 70–99)
Glucose-Capillary: 236 mg/dL — ABNORMAL HIGH (ref 70–99)
Glucose-Capillary: 131 mg/dL — ABNORMAL HIGH (ref 70–99)

## 2021-02-19 LAB — BPAM RBC
Blood Product Expiration Date: 202209152359
ISSUE DATE / TIME: 202208171758
Unit Type and Rh: 5100

## 2021-02-19 LAB — CBC
HCT: 27 % — ABNORMAL LOW (ref 39.0–52.0)
Hemoglobin: 8.3 g/dL — ABNORMAL LOW (ref 13.0–17.0)
MCH: 26.1 pg (ref 26.0–34.0)
MCHC: 30.7 g/dL (ref 30.0–36.0)
MCV: 84.9 fL (ref 80.0–100.0)
Platelets: 314 10*3/uL (ref 150–400)
RBC: 3.18 MIL/uL — ABNORMAL LOW (ref 4.22–5.81)
RDW: 15.6 % — ABNORMAL HIGH (ref 11.5–15.5)
WBC: 17.6 10*3/uL — ABNORMAL HIGH (ref 4.0–10.5)
nRBC: 0 % (ref 0.0–0.2)

## 2021-02-19 MED ORDER — IPRATROPIUM-ALBUTEROL 0.5-2.5 (3) MG/3ML IN SOLN
3.0000 mL | RESPIRATORY_TRACT | Status: DC | PRN
  Administered 2021-02-22: 3 mL via RESPIRATORY_TRACT
  Filled 2021-02-19: qty 3

## 2021-02-19 NOTE — Progress Notes (Signed)
PT Cancellation Note  Patient Details Name: Robert Little MRN: [id number] DOB: Feb 24, 1941   Cancelled Treatment:    Reason Eval/Treat Not Completed: Patient declined, no reason specified. Patient is unwilling to participate with PT and becoming agitated with encouragement. He asked therapist to leave the room. PT will follow up as appropriate.   Minna Merritts, PT, MPT  Percell Locus 02/19/2021, 1:15 PM

## 2021-02-19 NOTE — Progress Notes (Signed)
PROGRESS NOTE    Robert Little  KYH:[id number] DOB: [birth year] DOA: 02/17/2021 PCP: [name]    Brief Narrative:  Robert Little is a 80 y.o. male with medical history significant for schizophrenia, insulin-dependent diabetes mellitus, anxiety, hyperlipidemia, GERD, hypertension, CAD, CKD 3/CKD 4, chronic anemia, presents to the emergency department from facility for chief concerns of weakness.Patient is irritable and easily annoyed with questions and does not want to fully participate in physical exam  8/17- in foul mood this am. Yells back with any answers.Unable to obtain good history.BG 67 this am 8/18 has no complaints. Less yelling today  Consultants:    Procedures:   Antimicrobials:      Subjective: No overnight issues. No sob. C/o room being cold  Objective: Vitals:   02/18/21 2202 02/19/21 0457 02/19/21 0731 02/19/21 1148  BP: (!) 133/45 (!) 118/50 (!) 127/58 (!) 119/52  Pulse: 67 78 90 90  Resp: 17 19 14 17   Temp: 98.6 F (37 C) 98.5 F (36.9 C) 98.3 F (36.8 C) 97.9 F (36.6 C)  TempSrc: Oral Oral  Oral  SpO2: 100% 100% 95% 95%  Weight:      Height:        Intake/Output Summary (Last 24 hours) at 02/19/2021 1422 Last data filed at 02/19/2021 0516 Gross per 24 hour  Intake 904.01 ml  Output 900 ml  Net 4.01 ml   Filed Weights   02/17/21 1359  Weight: 66 kg    Examination:  Nad, calm Cta no w/r Regular s1/s2 no gallop 'soft benign +bs No edema Mood and affect appropriate in current    Data Reviewed: I have personally reviewed following labs and imaging studies  CBC: Recent Labs  Lab 02/17/21 1343 02/18/21 0403 02/19/21 0501  WBC 17.9* 19.1* 17.6*  HGB 7.4* 6.4* 8.3*  HCT 24.2* 21.0* 27.0*  MCV 84.0 84.0 84.9  PLT 384 355 151   Basic Metabolic Panel: Recent Labs  Lab 02/17/21 1343 02/18/21 0403  NA 134* 138  K 5.5* 3.9  CL 104 110  CO2 19* 21*  GLUCOSE 152* 62*  BUN 61* 53*  CREATININE 2.22* 1.88*   CALCIUM 9.1 8.6*   GFR: Estimated Creatinine Clearance: 30.2 mL/min (A) (by C-G formula based on SCr of 1.88 mg/dL (H)). Liver Function Tests: Recent Labs  Lab 02/17/21 1343 02/18/21 0403  AST 48* 30  ALT 31 27  ALKPHOS 210* 200*  BILITOT 1.6* 0.8  PROT 6.6 5.9*  ALBUMIN 2.1* 2.0*   No results for input(s): LIPASE, AMYLASE in the last 168 hours. No results for input(s): AMMONIA in the last 168 hours. Coagulation Profile: No results for input(s): INR, PROTIME in the last 168 hours. Cardiac Enzymes: No results for input(s): CKTOTAL, CKMB, CKMBINDEX, TROPONINI in the last 168 hours. BNP (last 3 results) No results for input(s): PROBNP in the last 8760 hours. HbA1C: Recent Labs    02/17/21 1343  HGBA1C 6.9*   CBG: Recent Labs  Lab 02/18/21 1121 02/18/21 1737 02/18/21 2139 02/19/21 0734 02/19/21 1146  GLUCAP 161* 187* 162* 105* 236*   Lipid Profile: No results for input(s): CHOL, HDL, LDLCALC, TRIG, CHOLHDL, LDLDIRECT in the last 72 hours. Thyroid Function Tests: No results for input(s): TSH, T4TOTAL, FREET4, T3FREE, THYROIDAB in the last 72 hours. Anemia Panel: Recent Labs    02/18/21 0403  VITAMINB12 2,349*  FOLATE 9.8  FERRITIN 2,264*  TIBC 120*  IRON 15*  RETICCTPCT 2.0   Sepsis Labs: No results for input(s): PROCALCITON,  LATICACIDVEN in the last 168 hours.  Recent Results (from the past 240 hour(s))  Resp Panel by RT-PCR (Flu A&B, Covid) Nasopharyngeal Swab     Status: None   Collection Time: 02/17/21  2:11 PM   Specimen: Nasopharyngeal Swab; Nasopharyngeal(NP) swabs in vial transport medium  Result Value Ref Range Status   SARS Coronavirus 2 by RT PCR NEGATIVE NEGATIVE Final    Comment: (NOTE) SARS-CoV-2 target nucleic acids are NOT DETECTED.  The SARS-CoV-2 RNA is generally detectable in upper respiratory specimens during the acute phase of infection. The lowest concentration of SARS-CoV-2 viral copies this assay can detect is 138 copies/mL. A  negative result does not preclude SARS-Cov-2 infection and should not be used as the sole basis for treatment or other patient management decisions. A negative result may occur with  improper specimen collection/handling, submission of specimen other than nasopharyngeal swab, presence of viral mutation(s) within the areas targeted by this assay, and inadequate number of viral copies(<138 copies/mL). A negative result must be combined with clinical observations, patient history, and epidemiological information. The expected result is Negative.  Fact Sheet for Patients:  EntrepreneurPulse.com.au  Fact Sheet for Healthcare Providers:  IncredibleEmployment.be  This test is no t yet approved or cleared by the [place] FDA and  has been authorized for detection and/or diagnosis of SARS-CoV-2 by FDA under an Emergency Use Authorization (EUA). This EUA will remain  in effect (meaning this test can be used) for the duration of the COVID-19 declaration under Section 564(b)(1) of the Act, 21 U.S.C.section 360bbb-3(b)(1), unless the authorization is terminated  or revoked sooner.       Influenza A by PCR NEGATIVE NEGATIVE Final   Influenza B by PCR NEGATIVE NEGATIVE Final    Comment: (NOTE) The Xpert Xpress SARS-CoV-2/FLU/RSV plus assay is intended as an aid in the diagnosis of influenza from Nasopharyngeal swab specimens and should not be used as a sole basis for treatment. Nasal washings and aspirates are unacceptable for Xpert Xpress SARS-CoV-2/FLU/RSV testing.  Fact Sheet for Patients: EntrepreneurPulse.com.au  Fact Sheet for Healthcare Providers: IncredibleEmployment.be  This test is not yet approved or cleared by the [place] FDA and has been authorized for detection and/or diagnosis of SARS-CoV-2 by FDA under an Emergency Use Authorization (EUA). This EUA will remain in effect (meaning this test can  be used) for the duration of the COVID-19 declaration under Section 564(b)(1) of the Act, 21 U.S.C. section 360bbb-3(b)(1), unless the authorization is terminated or revoked.  Performed at [facility], [address] [place], [place] [address]   Blood culture (routine x 2)     Status: Abnormal (Preliminary result)   Collection Time: 02/17/21  3:19 PM   Specimen: BLOOD  Result Value Ref Range Status   Specimen Description   Final    BLOOD RIGHT ANTECUBITAL Performed at [facility], [address], [place], [place] [address]    Special Requests   Final    BOTTLES DRAWN AEROBIC AND ANAEROBIC Blood Culture results may not be optimal due to an inadequate volume of blood received in culture bottles Performed at [facility], [address] [place], [place] [address]    Culture  Setup Time   Final    Organism ID to follow [place] TO, READ BACK BY AND VERIFIED WITH: [facility] MADE 02/18/21 [place] Performed at [facility], [address], [place], [place] [address]    Culture AEROCOCCUS VIRIDANS (A)  Final   Report Status PENDING  Incomplete  Blood Culture ID Panel (Reflexed)     Status: None   Collection Time: 02/17/21  3:19 PM  Result Value Ref Range Status   Enterococcus faecalis NOT DETECTED NOT DETECTED Final   Enterococcus Faecium NOT DETECTED NOT DETECTED Final   Listeria monocytogenes NOT DETECTED NOT DETECTED Final   Staphylococcus species NOT DETECTED NOT DETECTED Final   Staphylococcus aureus (BCID) NOT DETECTED NOT DETECTED Final   Staphylococcus epidermidis NOT DETECTED NOT DETECTED Final   Staphylococcus lugdunensis NOT DETECTED NOT DETECTED Final   Streptococcus species NOT DETECTED NOT DETECTED Final   Streptococcus agalactiae NOT DETECTED NOT DETECTED Final   Streptococcus pneumoniae NOT DETECTED NOT DETECTED Final   Streptococcus pyogenes NOT DETECTED NOT DETECTED  Final   A.calcoaceticus-baumannii NOT DETECTED NOT DETECTED Final   Bacteroides fragilis NOT DETECTED NOT DETECTED Final   Enterobacterales NOT DETECTED NOT DETECTED Final   Enterobacter cloacae complex NOT DETECTED NOT DETECTED Final   Escherichia coli NOT DETECTED NOT DETECTED Final   Klebsiella aerogenes NOT DETECTED NOT DETECTED Final   Klebsiella oxytoca NOT DETECTED NOT DETECTED Final   Klebsiella pneumoniae NOT DETECTED NOT DETECTED Final   Proteus species NOT DETECTED NOT DETECTED Final   Salmonella species NOT DETECTED NOT DETECTED Final   Serratia marcescens NOT DETECTED NOT DETECTED Final   Haemophilus influenzae NOT DETECTED NOT DETECTED Final   Neisseria meningitidis NOT DETECTED NOT DETECTED Final   Pseudomonas aeruginosa NOT DETECTED NOT DETECTED Final   Stenotrophomonas maltophilia NOT DETECTED NOT DETECTED Final   Candida albicans NOT DETECTED NOT DETECTED Final   Candida auris NOT DETECTED NOT DETECTED Final   Candida glabrata NOT DETECTED NOT DETECTED Final   Candida krusei NOT DETECTED NOT DETECTED Final   Candida parapsilosis NOT DETECTED NOT DETECTED Final   Candida tropicalis NOT DETECTED NOT DETECTED Final   Cryptococcus neoformans/gattii NOT DETECTED NOT DETECTED Final    Comment: Performed at [facility], [address] [place], [place] [address]  Blood culture (routine x 2)     Status: None (Preliminary result)   Collection Time: 02/17/21  3:25 PM   Specimen: BLOOD  Result Value Ref Range Status   Specimen Description BLOOD BLOOD LEFT HAND  Final   Special Requests   Final    BOTTLES DRAWN AEROBIC ONLY Blood Culture results may not be optimal due to an inadequate volume of blood received in culture bottles   Culture   Final    NO GROWTH 2 DAYS Performed at [facility], [address] [place], [place] [address]    Report Status PENDING  Incomplete         Radiology Studies: US Abdomen Limited RUQ (LIVER/GB)  Result Date:  02/17/2021 CLINICAL DATA:  Elevated liver function test. EXAM: ULTRASOUND ABDOMEN LIMITED RIGHT UPPER QUADRANT COMPARISON:  None. FINDINGS: Gallbladder: No gallstones or wall thickening visualized (2.0 mm). No sonographic Murphy sign noted by sonographer. Common bile duct: Diameter: 2.3 mm Liver: An 8.8 cm x 8.4 cm x 8.4 cm heterogeneous liver mass is seen within the right lobe of the liver. There is diffusely increased echogenicity of the liver parenchyma. Portal vein is patent on color Doppler imaging with normal direction of blood flow towards the liver. Other: None. IMPRESSION: 1. Large heterogeneous liver mass, worrisome for the presence of an underlying neoplasm. Further evaluation with hepatic MRI is recommended. Electronically Signed   By: Virgina Norfolk M.D.   On: 02/17/2021 20:05  Scheduled Meds:  atorvastatin  20 mg Oral QHS   dextromethorphan-guaiFENesin  1 tablet Oral BID   feeding supplement  237 mL Oral TID BM   ferrous sulfate  325 mg Oral BID WC   fluticasone furoate-vilanterol  1 puff Inhalation Daily   insulin aspart  0-5 Units Subcutaneous QHS   insulin aspart  0-9 Units Subcutaneous TID WC   insulin glargine-yfgn  5 Units Subcutaneous QHS   multivitamin with minerals  1 tablet Oral Daily   pantoprazole  40 mg Oral Daily   QUEtiapine  25 mg Oral Daily   tamsulosin  0.4 mg Oral QPC supper   umeclidinium bromide  1 puff Inhalation Daily   vitamin B-12  500 mcg Oral BID   Continuous Infusions:  azithromycin 500 mg (02/19/21 0946)   cefTRIAXone (ROCEPHIN)  IV 1 g (02/19/21 0908)    Assessment & Plan:   Principal Problem:   Left lower lobe pneumonia Active Problems:   Essential hypertension   Schizo affective schizophrenia ([place])   HLD (hyperlipidemia)   CKD (chronic kidney disease), stage IV (HCC)   Malnutrition of moderate degree   Elevated troponin   Paroxysmal atrial fibrillation (HCC)   Neutrophilic leukocytosis   Anemia due to chronic kidney  disease   Anemia due to stage 4 chronic kidney disease (HCC)   Elevated LFTs   Protein-calorie malnutrition, severe   # Left lower lobe pneumonia per chest x-ray read - Patient did endorse cough with me, unclear if it is productive - COVID/influenza A/influenza B PCR were negative 8/18-continue ceftriaxone and azithromycin PT consulted      # Acute on chronic anemia - Hemoglobin ranged of 7.7-8.6 in 2021 - CBC in the a.m. - Patient denies nausea vomiting, black stool, bleeding and is otherwise hemodynamically stable 8/17- continue iron pills Ck stool occult Hg 6.4, spoke to legal guardian [name] and discussed transfusion and possible complications and he is agreeable. Told me pt was transfused last year without any issues. Will transfuse 1prbc, ck post h/h, if <7 will give a second unit DC heparin subcu and aspirin until GI bleed ruled out 8/18 hemoglobin 8.3 Stool occult negative We will continue to monitor.  May need hematology follow-up as outpatient    # History of chronic leukocytosis - WBC has been chronically elevated in 2021 - This is the highest level its been 8/18 possibly due to infection Trending downward      # Elevated liver enzymes Improved   # Severe protein/calorie malnutrition-resumed Ensure supplements Dietitian consulted  # Elevated troponin with downtime trending delta troponin - Patient denies chest pain and shortness of breath at this time - Low clinical suspicion for ACS   # Insulin-dependent diabetes mellitus-I suspect this is type II - Resumed home long-acting 10 units nightly - Insulin SSI with at bedtime coverage ordered - Check A1c    # Query dementia # Schizophrenia with schizoaffective disorder - Resumed home quetiapine 25 mg p.o. daily   # Anxiety-resume Klonopin 0.25 mg p.o. twice daily as needed for anxiety      DVT prophylaxis: SCD Code Status: DNR Family Communication: None at bedside  disposition Plan:   Status is: Inpatient   The patient remains inpatient due to severity of illness and requiring IV treatment  Dispo: The patient is from: Group home              Anticipated d/c is to: Group home  Patient currently is not medically stable to d/c.   Difficult to place patient No  Will check labs in a.m. if stable likely can discharge tomorrow Will need PT         LOS: 1 day   Time spent: 35 minutes with more than 50% on [place], MD [facility] Pager [phone]  If 7PM-7AM, please contact night-coverage 02/19/2021, 2:22 PM

## 2021-02-19 NOTE — NC FL2 (Signed)
[place] LEVEL OF CARE SCREENING TOOL     IDENTIFICATION  Patient Name: Robert Little Birthdate: Jun 01, 1941 Sex: male Admission Date (Current Location): 02/17/2021  [facility] and [place] Number:  Engineering geologist and Address:  [facility], [address], [place], [place] [address]      Provider Number: [id number]  Attending Physician Name and Address:  Nolberto Hanlon, MD  Relative Name and Phone Number:       Current Level of Care: Hospital Recommended Level of Care: Nursing Facility Prior Approval Number:    Date Approved/Denied:   PASRR Number: [id number]  Discharge Plan: SNF    Current Diagnoses: Patient Active Problem List   Diagnosis Date Noted   Protein-calorie malnutrition, severe 02/18/2021   Left lower lobe pneumonia 35/57/3220   Neutrophilic leukocytosis 25/42/7062   Anemia due to chronic kidney disease 02/17/2021   Anemia due to stage 4 chronic kidney disease ([place]) 02/17/2021   Elevated LFTs 02/17/2021   Elevated troponin 01/26/2020   Paroxysmal atrial fibrillation ([place]) 01/26/2020   Pressure injury of skin 01/25/2020   Malnutrition of moderate degree 01/25/2020   Acute renal failure with acute renal cortical necrosis superimposed on stage 4 chronic kidney disease ([place])    HCAP (healthcare-associated pneumonia) 01/24/2020   Sepsis due to COVID-19 ([place]) 11/12/2019   Hypoglycemia 10/28/2019   Hyponatremia 10/28/2019   Acute on chronic respiratory failure with hypoxia ([place]) 09/17/2019   Positive D dimer 09/17/2019   Acute on chronic diastolic CHF (congestive heart failure) ([place]) 09/17/2019   Chronic diastolic CHF (congestive heart failure), NYHA class 1 ([place]) 09/17/2019   Acute hypoxemic respiratory failure ([place]) 09/17/2019   Syncope 09/05/2019   Hypocalcemia 09/05/2019   Hypomagnesemia 09/05/2019   CKD (chronic kidney disease), stage IV ([place]) 09/05/2019   Acute urinary retention 05/18/2018   UTI (urinary tract  infection) 05/15/2018   Hypokalemia 05/15/2018   AKI (acute kidney injury) ([place]) 05/15/2018   HLD (hyperlipidemia) 05/15/2018   Coronary artery disease involving native coronary artery of native heart without angina pectoris    Essential hypertension    Pulmonary fibrosis (HCC)    Schizo affective schizophrenia ([place])    Normocytic anemia     Orientation RESPIRATION BLADDER Height & Weight     Self, Time, Place  Normal Continent Weight: 66 kg Height:  5\' 8"  (172.7 cm)  BEHAVIORAL SYMPTOMS/MOOD NEUROLOGICAL BOWEL NUTRITION STATUS      Continent Diet (carb modified)  AMBULATORY STATUS COMMUNICATION OF NEEDS Skin   Extensive Assist Verbally Normal                       Personal Care Assistance Level of Assistance  Bathing, Feeding, Dressing Bathing Assistance: Limited assistance Feeding assistance: Independent Dressing Assistance: Limited assistance     Functional Limitations Info             SPECIAL CARE FACTORS FREQUENCY  PT (By licensed PT)     PT Frequency: 5 times per week              Contractures Contractures Info: Not present    Additional Factors Info  Code Status, Allergies Code Status Info: DNR Allergies Info: NKDA           Current Medications (02/19/2021):  This is the current hospital active medication list Current Facility-Administered Medications  Medication Dose Route Frequency Provider Last Rate Last Admin   atorvastatin (LIPITOR) tablet 20 mg  20 mg Oral QHS Cox, Amy N,  DO   20 mg at 02/18/21 2308   azithromycin (ZITHROMAX) 500 mg in sodium chloride 0.9 % 250 mL IVPB  500 mg Intravenous Q24H Cox, Amy N, DO   Stopped at 02/18/21 1104   cefTRIAXone (ROCEPHIN) 1 g in sodium chloride 0.9 % 100 mL IVPB  1 g Intravenous Q24H Cox, Amy N, DO 200 mL/hr at 02/19/21 0908 1 g at 02/19/21 0908   clonazePAM (KLONOPIN) disintegrating tablet 0.25 mg  0.25 mg Oral BID PRN Cox, Amy N, DO   0.25 mg at 02/17/21 2344   dextromethorphan-guaiFENesin  (MUCINEX DM) 30-600 MG per 12 hr tablet 1 tablet  1 tablet Oral BID Cox, Amy N, DO   1 tablet at 02/19/21 0906   feeding supplement (ENSURE ENLIVE / ENSURE PLUS) liquid 237 mL  237 mL Oral TID BM Nolberto Hanlon, MD   237 mL at 02/19/21 0913   ferrous sulfate tablet 325 mg  325 mg Oral BID WC Cox, Amy N, DO   325 mg at 02/19/21 0854   fluticasone furoate-vilanterol (BREO ELLIPTA) 200-25 MCG/INH 1 puff  1 puff Inhalation Daily Cox, Amy N, DO   1 puff at 02/19/21 0912   insulin aspart (novoLOG) injection 0-5 Units  0-5 Units Subcutaneous QHS Cox, Amy N, DO       insulin aspart (novoLOG) injection 0-9 Units  0-9 Units Subcutaneous TID WC Cox, Amy N, DO   2 Units at 02/18/21 1741   insulin glargine-yfgn (SEMGLEE) injection 5 Units  5 Units Subcutaneous QHS Nolberto Hanlon, MD   5 Units at 02/18/21 2309   ipratropium-albuterol (DUONEB) 0.5-2.5 (3) MG/3ML nebulizer solution 3 mL  3 mL Nebulization Q4H PRN Nolberto Hanlon, MD       multivitamin with minerals tablet 1 tablet  1 tablet Oral Daily Cox, Amy N, DO   1 tablet at 02/19/21 0905   ondansetron (ZOFRAN) tablet 4 mg  4 mg Oral Q6H PRN Cox, Amy N, DO       Or   ondansetron (ZOFRAN) injection 4 mg  4 mg Intravenous Q6H PRN Cox, Amy N, DO       pantoprazole (PROTONIX) EC tablet 40 mg  40 mg Oral Daily Cox, Amy N, DO   40 mg at 02/19/21 0906   polyethylene glycol (MIRALAX / GLYCOLAX) packet 17 g  17 g Oral BID PRN Rito Ehrlich A, RPH       QUEtiapine (SEROQUEL) tablet 25 mg  25 mg Oral Daily Cox, Amy N, DO   25 mg at 02/19/21 0905   tamsulosin (FLOMAX) capsule 0.4 mg  0.4 mg Oral QPC supper Cox, Amy N, DO   0.4 mg at 02/18/21 1738   umeclidinium bromide (INCRUSE ELLIPTA) 62.5 MCG/INH 1 puff  1 puff Inhalation Daily Cox, Amy N, DO   1 puff at 02/19/21 0913   vitamin B-12 (CYANOCOBALAMIN) tablet 500 mcg  500 mcg Oral BID Cox, Amy N, DO   500 mcg at 02/19/21 1700     Discharge Medications: Please see discharge summary for a list of discharge  medications.  Relevant Imaging Results:  Relevant Lab Results:   Additional Information SSN: [id number]  Su Hilt, RN

## 2021-02-19 NOTE — Care Management Important Message (Signed)
Important Message  Patient Details  Name: Robert Little MRN: [id number] Date of Birth: Jan 31, 1941   Medicare Important Message Given:  N/A - LOS <3 / Initial given by admissions  Initial Medicare IM reviewed with [name], legal guardian, at [phone] by Brantley Fling, Patient Access Associate on 02/19/2021 at 12:00pm.    Dannette Barbara 02/19/2021, 2:28 PM

## 2021-02-19 NOTE — Progress Notes (Signed)
OT Cancellation Note  Patient Details Name: Robert Little MRN: [id number] DOB: [birth year]   Cancelled Treatment:    Reason Eval/Treat Not Completed: Patient declined, no reason specified. Chart reviewed. Pt refuses to participate in session this date. Will follow up at next date as able to initiate services.  Dessie Coma, M.S. OTR/L  02/19/21, 4:49 PM  ascom [phone]

## 2021-02-20 ENCOUNTER — Inpatient Hospital Stay: Payer: Medicare Other

## 2021-02-20 DIAGNOSIS — N184 Chronic kidney disease, stage 4 (severe): Secondary | ICD-10-CM | POA: Diagnosis not present

## 2021-02-20 DIAGNOSIS — J189 Pneumonia, unspecified organism: Secondary | ICD-10-CM | POA: Diagnosis not present

## 2021-02-20 DIAGNOSIS — R7989 Other specified abnormal findings of blood chemistry: Secondary | ICD-10-CM | POA: Diagnosis not present

## 2021-02-20 DIAGNOSIS — N1832 Chronic kidney disease, stage 3b: Secondary | ICD-10-CM | POA: Diagnosis not present

## 2021-02-20 DIAGNOSIS — R7881 Bacteremia: Secondary | ICD-10-CM

## 2021-02-20 LAB — GLUCOSE, CAPILLARY
Glucose-Capillary: 98 mg/dL (ref 70–99)
Glucose-Capillary: 115 mg/dL — ABNORMAL HIGH (ref 70–99)
Glucose-Capillary: 97 mg/dL (ref 70–99)

## 2021-02-20 LAB — CBC
HCT: 28.6 % — ABNORMAL LOW (ref 39.0–52.0)
Hemoglobin: 8.9 g/dL — ABNORMAL LOW (ref 13.0–17.0)
MCH: 25.1 pg — ABNORMAL LOW (ref 26.0–34.0)
MCHC: 31.1 g/dL (ref 30.0–36.0)
MCV: 80.8 fL (ref 80.0–100.0)
Platelets: 354 10*3/uL (ref 150–400)
RBC: 3.54 MIL/uL — ABNORMAL LOW (ref 4.22–5.81)
RDW: 15.9 % — ABNORMAL HIGH (ref 11.5–15.5)
WBC: 17.8 10*3/uL — ABNORMAL HIGH (ref 4.0–10.5)
nRBC: 0 % (ref 0.0–0.2)

## 2021-02-20 LAB — CREATININE, SERUM
Creatinine, Ser: 1.5 mg/dL — ABNORMAL HIGH (ref 0.61–1.24)
GFR, Estimated: 47 mL/min — ABNORMAL LOW (ref >60)

## 2021-02-20 MED ORDER — LORAZEPAM 2 MG/ML IJ SOLN
0.5000 mg | Freq: Once | INTRAMUSCULAR | Status: AC
  Administered 2021-02-21: 0.5 mg via INTRAVENOUS
  Filled 2021-02-20: qty 1

## 2021-02-20 MED ORDER — ENOXAPARIN SODIUM 40 MG/0.4ML IJ SOSY
40.0000 mg | PREFILLED_SYRINGE | INTRAMUSCULAR | Status: DC
  Administered 2021-02-20 – 2021-02-23 (×4): 40 mg via SUBCUTANEOUS
  Filled 2021-02-20 (×4): qty 0.4

## 2021-02-20 NOTE — Progress Notes (Signed)
OT Cancellation Note  Patient Details Name: Robert Little MRN: [id number] DOB: February 16, 1941   Cancelled Treatment:    Reason Eval/Treat Not Completed: Patient declined, no reason specified;Other (comment). On 3rd attempt to see patient for OT evaluation, pt wakes briefly to verbal cues. Tolerates a couple short questions as he appears cold with blankets down near his knees. Pt is agreeable to OT adjusting blankets around his shoulders but with any other questions to support rapport building and encouragement for movement or even to try eating something from his lunch tray, pt quickly becomes agitated and more difficult to understand when he speaks. Pt inappropriate for therapy participation at this time. Pt is currently refusing and becoming very agitated with any attempts. Will hold OT evaluation and complete current order at this time. Please consider re-consulting OT at later date/time if pt becomes more appropriate.  Hanley Hays, MPH, MS, OTR/L ascom [phone] 02/20/21, 2:45 PM

## 2021-02-20 NOTE — Progress Notes (Signed)
PT Cancellation Note  Patient Details Name: Robert Little MRN: [id number] DOB: 1941/05/29   Cancelled Treatment:    Reason Eval/Treat Not Completed: Patient declined, no reason specified. Upon entry of room pt quick to become agitated refusing PT at this time. Similar to previous documentation, pt quick to cuss at PT with very minimal questions asked by therapist. Will re-attempt once more before signing off on PT orders.   Salem Caster. Fairly IV, PT, DPT Physical Therapist- [facility]  02/20/2021, 10:09 AM

## 2021-02-20 NOTE — Progress Notes (Signed)
PT Cancellation Note  Patient Details Name: Robert Little MRN: [id number] DOB: July 29, 1940   Cancelled Treatment:    Reason Eval/Treat Not Completed: Patient declined, no reason specified. Pt found sleeping, easily awoken to voice. Declining PT intervention. PT did not press to perform due to easily agitated behavior cussing at hospital staff.Third attempt to perform eval. Plan to complete PT orders.    [place] Caster. Fairly IV, PT, DPT Physical Therapist- [facility]  02/20/2021, 1:00 PM

## 2021-02-20 NOTE — Plan of Care (Signed)

## 2021-02-20 NOTE — Progress Notes (Addendum)
PROGRESS NOTE    Robert Little  XLK:[id number] DOB: [birth year] DOA: 02/17/2021 PCP: [name]    Brief Narrative:  Robert Little is a 80 y.o. male with medical history significant for schizophrenia, insulin-dependent diabetes mellitus, anxiety, hyperlipidemia, GERD, hypertension, CAD, CKD 3/CKD 4, chronic anemia, presents to the emergency department from facility for chief concerns of weakness.Patient is irritable and easily annoyed with questions and does not want to fully participate in physical exam  8/17- in foul mood this am. Yells back with any answers.Unable to obtain good history.BG 67 this am 8/18 has no complaints. Less yelling today 8/19 blood culture Aerococcus viridans.  Consultants:    Procedures:   Antimicrobials:      Subjective: No overnight issues.  Patient is irritable and yells during exam. Objective: Vitals:   02/19/21 1600 02/19/21 2103 02/20/21 0606 02/20/21 0743  BP: (!) 125/59 128/66 (!) 98/55 115/68  Pulse: 94 89 93 94  Resp: 17 18 20 15   Temp: 98.1 F (36.7 C) 98.3 F (36.8 C) 97.6 F (36.4 C) 97.6 F (36.4 C)  TempSrc:      SpO2: 92% 95% 97% 96%  Weight:      Height:        Intake/Output Summary (Last 24 hours) at 02/20/2021 0904 Last data filed at 02/20/2021 3664 Gross per 24 hour  Intake --  Output 1200 ml  Net -1200 ml   Filed Weights   02/17/21 1359  Weight: 66 kg    Examination: NAD CTA no wheeze rales Regular S1-S2 no gallops Soft benign positive bowel sounds No edema Awake and alert     Data Reviewed: I have personally reviewed following labs and imaging studies  CBC: Recent Labs  Lab 02/17/21 1343 02/18/21 0403 02/19/21 0501 02/20/21 0340  WBC 17.9* 19.1* 17.6* 17.8*  HGB 7.4* 6.4* 8.3* 8.9*  HCT 24.2* 21.0* 27.0* 28.6*  MCV 84.0 84.0 84.9 80.8  PLT 384 355 314 403   Basic Metabolic Panel: Recent Labs  Lab 02/17/21 1343 02/18/21 0403  NA 134* 138  K 5.5* 3.9  CL 104 110  CO2  19* 21*  GLUCOSE 152* 62*  BUN 61* 53*  CREATININE 2.22* 1.88*  CALCIUM 9.1 8.6*   GFR: Estimated Creatinine Clearance: 30.2 mL/min (A) (by C-G formula based on SCr of 1.88 mg/dL (H)). Liver Function Tests: Recent Labs  Lab 02/17/21 1343 02/18/21 0403  AST 48* 30  ALT 31 27  ALKPHOS 210* 200*  BILITOT 1.6* 0.8  PROT 6.6 5.9*  ALBUMIN 2.1* 2.0*   No results for input(s): LIPASE, AMYLASE in the last 168 hours. No results for input(s): AMMONIA in the last 168 hours. Coagulation Profile: No results for input(s): INR, PROTIME in the last 168 hours. Cardiac Enzymes: No results for input(s): CKTOTAL, CKMB, CKMBINDEX, TROPONINI in the last 168 hours. BNP (last 3 results) No results for input(s): PROBNP in the last 8760 hours. HbA1C: Recent Labs    02/17/21 1343  HGBA1C 6.9*   CBG: Recent Labs  Lab 02/18/21 2139 02/19/21 0734 02/19/21 1146 02/19/21 1558 02/20/21 0817  GLUCAP 162* 105* 236* 131* 98   Lipid Profile: No results for input(s): CHOL, HDL, LDLCALC, TRIG, CHOLHDL, LDLDIRECT in the last 72 hours. Thyroid Function Tests: No results for input(s): TSH, T4TOTAL, FREET4, T3FREE, THYROIDAB in the last 72 hours. Anemia Panel: Recent Labs    02/18/21 0403  VITAMINB12 2,349*  FOLATE 9.8  FERRITIN 2,264*  TIBC 120*  IRON 15*  RETICCTPCT 2.0  Sepsis Labs: No results for input(s): PROCALCITON, LATICACIDVEN in the last 168 hours.  Recent Results (from the past 240 hour(s))  Resp Panel by RT-PCR (Flu A&B, Covid) Nasopharyngeal Swab     Status: None   Collection Time: 02/17/21  2:11 PM   Specimen: Nasopharyngeal Swab; Nasopharyngeal(NP) swabs in vial transport medium  Result Value Ref Range Status   SARS Coronavirus 2 by RT PCR NEGATIVE NEGATIVE Final    Comment: (NOTE) SARS-CoV-2 target nucleic acids are NOT DETECTED.  The SARS-CoV-2 RNA is generally detectable in upper respiratory specimens during the acute phase of infection. The lowest concentration of  SARS-CoV-2 viral copies this assay can detect is 138 copies/mL. A negative result does not preclude SARS-Cov-2 infection and should not be used as the sole basis for treatment or other patient management decisions. A negative result may occur with  improper specimen collection/handling, submission of specimen other than nasopharyngeal swab, presence of viral mutation(s) within the areas targeted by this assay, and inadequate number of viral copies(<138 copies/mL). A negative result must be combined with clinical observations, patient history, and epidemiological information. The expected result is Negative.  Fact Sheet for Patients:  EntrepreneurPulse.com.au  Fact Sheet for Healthcare Providers:  IncredibleEmployment.be  This test is no t yet approved or cleared by the [place] FDA and  has been authorized for detection and/or diagnosis of SARS-CoV-2 by FDA under an Emergency Use Authorization (EUA). This EUA will remain  in effect (meaning this test can be used) for the duration of the COVID-19 declaration under Section 564(b)(1) of the Act, 21 U.S.C.section 360bbb-3(b)(1), unless the authorization is terminated  or revoked sooner.       Influenza A by PCR NEGATIVE NEGATIVE Final   Influenza B by PCR NEGATIVE NEGATIVE Final    Comment: (NOTE) The Xpert Xpress SARS-CoV-2/FLU/RSV plus assay is intended as an aid in the diagnosis of influenza from Nasopharyngeal swab specimens and should not be used as a sole basis for treatment. Nasal washings and aspirates are unacceptable for Xpert Xpress SARS-CoV-2/FLU/RSV testing.  Fact Sheet for Patients: EntrepreneurPulse.com.au  Fact Sheet for Healthcare Providers: IncredibleEmployment.be  This test is not yet approved or cleared by the [place] FDA and has been authorized for detection and/or diagnosis of SARS-CoV-2 by FDA under an Emergency Use  Authorization (EUA). This EUA will remain in effect (meaning this test can be used) for the duration of the COVID-19 declaration under Section 564(b)(1) of the Act, 21 U.S.C. section 360bbb-3(b)(1), unless the authorization is terminated or revoked.  Performed at [facility], [address] [place], [place] [address]   Blood culture (routine x 2)     Status: Abnormal   Collection Time: 02/17/21  3:19 PM   Specimen: BLOOD  Result Value Ref Range Status   Specimen Description   Final    BLOOD RIGHT ANTECUBITAL Performed at [facility], [address] [place], [place] [address]    Special Requests   Final    BOTTLES DRAWN AEROBIC AND ANAEROBIC Blood Culture results may not be optimal due to an inadequate volume of blood received in culture bottles Performed at [facility], [address] [place], [place] [address]    Culture  Setup Time   Final    Organism ID to follow [place] TO, READ BACK BY AND VERIFIED WITH: [facility] MADE 02/18/21 [place] Performed at [facility], [address] [place], [place] [address]  Culture AEROCOCCUS VIRIDANS (A)  Final   Report Status 02/20/2021 FINAL  Final  Blood Culture ID Panel (Reflexed)     Status: None   Collection Time: 02/17/21  3:19 PM  Result Value Ref Range Status   Enterococcus faecalis NOT DETECTED NOT DETECTED Final   Enterococcus Faecium NOT DETECTED NOT DETECTED Final   Listeria monocytogenes NOT DETECTED NOT DETECTED Final   Staphylococcus species NOT DETECTED NOT DETECTED Final   Staphylococcus aureus (BCID) NOT DETECTED NOT DETECTED Final   Staphylococcus epidermidis NOT DETECTED NOT DETECTED Final   Staphylococcus lugdunensis NOT DETECTED NOT DETECTED Final   Streptococcus species NOT DETECTED NOT DETECTED Final   Streptococcus agalactiae NOT DETECTED NOT DETECTED Final   Streptococcus pneumoniae NOT DETECTED NOT DETECTED  Final   Streptococcus pyogenes NOT DETECTED NOT DETECTED Final   A.calcoaceticus-baumannii NOT DETECTED NOT DETECTED Final   Bacteroides fragilis NOT DETECTED NOT DETECTED Final   Enterobacterales NOT DETECTED NOT DETECTED Final   Enterobacter cloacae complex NOT DETECTED NOT DETECTED Final   Escherichia coli NOT DETECTED NOT DETECTED Final   Klebsiella aerogenes NOT DETECTED NOT DETECTED Final   Klebsiella oxytoca NOT DETECTED NOT DETECTED Final   Klebsiella pneumoniae NOT DETECTED NOT DETECTED Final   Proteus species NOT DETECTED NOT DETECTED Final   Salmonella species NOT DETECTED NOT DETECTED Final   Serratia marcescens NOT DETECTED NOT DETECTED Final   Haemophilus influenzae NOT DETECTED NOT DETECTED Final   Neisseria meningitidis NOT DETECTED NOT DETECTED Final   Pseudomonas aeruginosa NOT DETECTED NOT DETECTED Final   Stenotrophomonas maltophilia NOT DETECTED NOT DETECTED Final   Candida albicans NOT DETECTED NOT DETECTED Final   Candida auris NOT DETECTED NOT DETECTED Final   Candida glabrata NOT DETECTED NOT DETECTED Final   Candida krusei NOT DETECTED NOT DETECTED Final   Candida parapsilosis NOT DETECTED NOT DETECTED Final   Candida tropicalis NOT DETECTED NOT DETECTED Final   Cryptococcus neoformans/gattii NOT DETECTED NOT DETECTED Final    Comment: Performed at [facility], [address] [place], [place] [address]  Blood culture (routine x 2)     Status: None (Preliminary result)   Collection Time: 02/17/21  3:25 PM   Specimen: BLOOD  Result Value Ref Range Status   Specimen Description BLOOD BLOOD LEFT HAND  Final   Special Requests   Final    BOTTLES DRAWN AEROBIC ONLY Blood Culture results may not be optimal due to an inadequate volume of blood received in culture bottles   Culture   Final    NO GROWTH 3 DAYS Performed at [facility], [address], [place], [place] [address]    Report Status PENDING  Incomplete         Radiology  Studies: No results found.      Scheduled Meds:  atorvastatin  20 mg Oral QHS   dextromethorphan-guaiFENesin  1 tablet Oral BID   feeding supplement  237 mL Oral TID BM   ferrous sulfate  325 mg Oral BID WC   fluticasone furoate-vilanterol  1 puff Inhalation Daily   insulin aspart  0-5 Units Subcutaneous QHS   insulin aspart  0-9 Units Subcutaneous TID WC   insulin glargine-yfgn  5 Units Subcutaneous QHS   multivitamin with minerals  1 tablet Oral Daily   pantoprazole  40 mg Oral Daily   QUEtiapine  25 mg Oral Daily   tamsulosin  0.4 mg Oral QPC supper   umeclidinium bromide  1 puff Inhalation Daily  vitamin B-12  500 mcg Oral BID   Continuous Infusions:  azithromycin 500 mg (02/19/21 0946)   cefTRIAXone (ROCEPHIN)  IV 1 g (02/19/21 0908)    Assessment & Plan:   Principal Problem:   Left lower lobe pneumonia Active Problems:   Essential hypertension   Schizo affective schizophrenia ([place])   HLD (hyperlipidemia)   CKD (chronic kidney disease), stage IV (HCC)   Malnutrition of moderate degree   Elevated troponin   Paroxysmal atrial fibrillation (HCC)   Neutrophilic leukocytosis   Anemia due to chronic kidney disease   Anemia due to stage 4 chronic kidney disease (HCC)   Elevated LFTs   Protein-calorie malnutrition, severe   # Left lower lobe pneumonia per chest x-ray read - Patient did endorse cough with me, unclear if it is productive - COVID/influenza A/influenza B PCR were negative 8/19 continue ceftriaxone and azithromycin  PT consulted but difficult to perform as patient has agitated behavior and yelling at the staff  +bcx with aer. Viridans. ID consulted.        # Acute on chronic anemia - Hemoglobin ranged of 7.7-8.6 in 2021 - CBC in the a.m. - Patient denies nausea vomiting, black stool, bleeding and is otherwise hemodynamically stable 8/17- continue iron pills Ck stool occult Hg 6.4, spoke to legal guardian [name] and discussed transfusion  and possible complications and he is agreeable. Told me pt was transfused last year without any issues. Will transfuse 1prbc, ck post h/h, if <7 will give a second unit DC heparin subcu and aspirin until GI bleed ruled out 8/19 hemoglobin remained stable, 8.9 today Stool occult negative Continue to monitor as outpatient may need follow-up with hematology as outpatient     # History of chronic leukocytosis - WBC has been chronically elevated in 2021 - This is the highest level its been 8/19- possibly due to infection  Continue to monitor      # Elevated liver enzymes Improved Ruq UK:GURKY heterogeneous liver mass, worrisome for the presence of an underlying neoplasm. Further evaluation with hepatic MRI is recommended.  Discussed with legal guardian about MRI, he was ok to obtain this. Will give ativan prior to going for imaging.     # Severe protein/calorie malnutrition-resumed Ensure supplements Dietitian consulted  # Elevated troponin with downtime trending delta troponin - Patient denies chest pain and shortness of breath at this time - Low clinical suspicion for ACS   # Insulin-dependent diabetes mellitus-I suspect this is type II - Resumed home long-acting 10 units nightly - Insulin SSI with at bedtime coverage ordered A1c 6.9  # Query dementia # Schizophrenia with schizoaffective disorder - Resumed home quetiapine 25 mg p.o. daily   # Anxiety-resume Klonopin 0.25 mg p.o. twice daily as needed for anxiety      DVT prophylaxis: SCD. lovenox Code Status: DNR Family Communication: None at bedside  disposition Plan:  Status is: Inpatient   The patient remains inpatient due to severity of illness and requiring IV treatment  Dispo: The patient is from: Group home              Anticipated d/c is to: Group home              Patient currently is not medically stable to d/c.   Difficult to place patient No  Will check labs in a.m. if stable likely can discharge  tomorrow Will need PT         LOS: 2 days   Time spent:  35 minutes with more than 50% on [place], MD [facility] Pager [phone]  If 7PM-7AM, please contact night-coverage 02/20/2021, 9:04 AM

## 2021-02-20 NOTE — Progress Notes (Signed)
Pt very agitated this morning, requesting staff to leave him alone. Refusing meds at this time, will reassess/attempt later.

## 2021-02-20 NOTE — Progress Notes (Signed)
OT Cancellation Note  Patient Details Name: Robert Little MRN: [id number] DOB: 16-Feb-1941   Cancelled Treatment:    Reason Eval/Treat Not Completed: Patient declined, no reason specified. Consult received, chart reviewed. Upon attempt, pt sleeping, wakes easily to verbal cues. Denied SOB. Endorsed having pain "all over." When asked about therapy, pt flatly refused. With additional simple questions pt became increasingly agitated, becoming louder and speech becoming more garbled/difficult to understand. RN notified of pain and refusal. This is OT's 2nd attempt to see pt, 2nd refusal. Will re-attempt once more before considering signing off.   Hanley Hays, MPH, MS, OTR/L ascom [phone] 02/20/21, 8:45 AM

## 2021-02-20 NOTE — Consult Note (Addendum)
NAME: Robert Little  DOB: 01/14/1941  MRN: [id number]  Date/Time: 02/20/2021 5:48 PM  REQUESTING PROVIDER: Dr.Amery Subjective:  REASON FOR CONSULT: bacteremia ?No history from patient- he gets agitated when disturbed. Chart reviewed Robert Little is a 80 y.o. male with a history of Schizophrenia, HTN, anemia, CKD,  CAD CABG  is coming from ? NH for not being his self- EMS note says that staff hadd called because patient was not himself- In the ED he answered some questions but got agitated when asked further questions As per the hospitalist note he ahs had body ache, cough , sore mouth  Tma 99.1, BP 114/50, HR 109, WBC 17.9, HB 7.4, Cr 2.22, PLT 384 CXR    Past Medical History:  Diagnosis Date   Anemia    Chronic diastolic CHF (congestive heart failure) (HCC)    Chronic respiratory failure ([place])    CKD (chronic kidney disease), stage IV ([place])    Coronary artery disease    [facility] at [facility]   COVID-19 11/2019   pneumonia   Diabetes mellitus ([place])    Dysphagia    GERD (gastroesophageal reflux disease)    Hypertension    Lung nodule    Pulmonary fibrosis ([place])    Schizo affective schizophrenia ([place])    Tobacco abuse     Past Surgical History:  Procedure Laterality Date   CORONARY ARTERY BYPASS GRAFT      Social History   Socioeconomic History   Marital status: Unknown    Spouse name: Not on file   Number of children: Not on file   Years of education: Not on file   Highest education level: Not on file  Occupational History   Not on file  Tobacco Use   Smoking status: Former    Types: Cigarettes   Smokeless tobacco: Never  Vaping Use   Vaping Use: Never used  Substance and Sexual Activity   Alcohol use: Not Currently   Drug use: Not Currently   Sexual activity: Not Currently  Other Topics Concern   Not on file  Social History Narrative   Not on file   Social Determinants of Health   Financial Resource Strain: Not on file  Food Insecurity: Not on file   Transportation Needs: Not on file  Physical Activity: Not on file  Stress: Not on file  Social Connections: Not on file  Intimate Partner Violence: Not on file    Family History  Problem Relation Age of Onset   Diabetes Mellitus II Mother    Diabetes Mellitus II Maternal Grandmother    Heart disease Maternal Grandmother    No Known Allergies I? Current Facility-Administered Medications  Medication Dose Route Frequency Provider Last Rate Last Admin   atorvastatin (LIPITOR) tablet 20 mg  20 mg Oral QHS Cox, Amy N, DO   20 mg at 02/19/21 2246   azithromycin (ZITHROMAX) 500 mg in sodium chloride 0.9 % 250 mL IVPB  500 mg Intravenous Q24H Cox, Amy N, DO 250 mL/hr at 02/20/21 1210 500 mg at 02/20/21 1210   cefTRIAXone (ROCEPHIN) 1 g in sodium chloride 0.9 % 100 mL IVPB  1 g Intravenous Q24H Cox, Amy N, DO 200 mL/hr at 02/20/21 1124 1 g at 02/20/21 1124   clonazePAM (KLONOPIN) disintegrating tablet 0.25 mg  0.25 mg Oral BID PRN Cox, Amy N, DO   0.25 mg at 02/17/21 2344   dextromethorphan-guaiFENesin (MUCINEX DM) 30-600 MG per 12 hr tablet 1 tablet  1 tablet Oral BID Cox, Amy  N, DO   1 tablet at 02/20/21 1122   enoxaparin (LOVENOX) injection 40 mg  40 mg Subcutaneous Q24H Nolberto Hanlon, MD       feeding supplement (ENSURE ENLIVE / ENSURE PLUS) liquid 237 mL  237 mL Oral TID BM Nolberto Hanlon, MD   237 mL at 02/20/21 1128   ferrous sulfate tablet 325 mg  325 mg Oral BID WC Cox, Amy N, DO   325 mg at 02/20/21 1122   fluticasone furoate-vilanterol (BREO ELLIPTA) 200-25 MCG/INH 1 puff  1 puff Inhalation Daily Cox, Amy N, DO   1 puff at 02/20/21 1127   insulin aspart (novoLOG) injection 0-5 Units  0-5 Units Subcutaneous QHS Cox, Amy N, DO       insulin aspart (novoLOG) injection 0-9 Units  0-9 Units Subcutaneous TID WC Cox, Amy N, DO   1 Units at 02/19/21 1735   insulin glargine-yfgn (SEMGLEE) injection 5 Units  5 Units Subcutaneous QHS Nolberto Hanlon, MD   5 Units at 02/19/21 2247    ipratropium-albuterol (DUONEB) 0.5-2.5 (3) MG/3ML nebulizer solution 3 mL  3 mL Nebulization Q4H PRN Nolberto Hanlon, MD       LORazepam (ATIVAN) injection 0.5 mg  0.5 mg Intravenous Once Nolberto Hanlon, MD       multivitamin with minerals tablet 1 tablet  1 tablet Oral Daily Cox, Amy N, DO   1 tablet at 02/20/21 1122   ondansetron (ZOFRAN) tablet 4 mg  4 mg Oral Q6H PRN Cox, Amy N, DO       Or   ondansetron (ZOFRAN) injection 4 mg  4 mg Intravenous Q6H PRN Cox, Amy N, DO       pantoprazole (PROTONIX) EC tablet 40 mg  40 mg Oral Daily Cox, Amy N, DO   40 mg at 02/20/21 1122   polyethylene glycol (MIRALAX / GLYCOLAX) packet 17 g  17 g Oral BID PRN Rito Ehrlich A, RPH       QUEtiapine (SEROQUEL) tablet 25 mg  25 mg Oral Daily Cox, Amy N, DO   25 mg at 02/20/21 1122   tamsulosin (FLOMAX) capsule 0.4 mg  0.4 mg Oral QPC supper Cox, Amy N, DO   0.4 mg at 02/19/21 1734   umeclidinium bromide (INCRUSE ELLIPTA) 62.5 MCG/INH 1 puff  1 puff Inhalation Daily Cox, Amy N, DO   1 puff at 02/20/21 1127   vitamin B-12 (CYANOCOBALAMIN) tablet 500 mcg  500 mcg Oral BID Cox, Amy N, DO   500 mcg at 02/20/21 1122     Abtx:  Anti-infectives (From admission, onward)    Start     Dose/Rate Route Frequency Ordered Stop   02/18/21 1000  azithromycin (ZITHROMAX) 500 mg in sodium chloride 0.9 % 250 mL IVPB        500 mg 250 mL/hr over 60 Minutes Intravenous Every 24 hours 02/17/21 1508     02/18/21 1000  cefTRIAXone (ROCEPHIN) 1 g in sodium chloride 0.9 % 100 mL IVPB        1 g 200 mL/hr over 30 Minutes Intravenous Every 24 hours 02/17/21 1508     02/17/21 1445  cefTRIAXone (ROCEPHIN) 1 g in sodium chloride 0.9 % 100 mL IVPB        1 g 200 mL/hr over 30 Minutes Intravenous  Once 02/17/21 1437 02/17/21 1648   02/17/21 1445  azithromycin (ZITHROMAX) 500 mg in sodium chloride 0.9 % 250 mL IVPB        500 mg 250 mL/hr  over 60 Minutes Intravenous  Once 02/17/21 1437 02/17/21 1749       REVIEW OF SYSTEMS:   NA Objective:  VITALS:  BP (!) 143/57 (BP Location: Right Arm)   Pulse 91   Temp 97.9 F (36.6 C)   Resp 14   Ht 5\' 8"  (1.727 m)   Wt 66 kg   SpO2 98%   BMI 22.12 kg/m  PHYSICAL EXAM:  General: pt was sleeping with mouth open On looking in his mouth he has superficial ulceration soft palate Poor dentition Patient then woke up and started shouting and asked me to leave him alone So examination was interrupted Head: Normocephalic, without obvious abnormality, atraumatic. Extremities: atraumatic, no cyanosis. No edema. No clubbing Skin: No rashes or lesions. Or bruising Lymph: Cervical, supraclavicular normal. Neurologic: moves a limbs Grossly non-focal Pertinent Labs Lab Results CBC    Component Value Date/Time   WBC 17.8 (H) 02/20/2021 0340   RBC 3.54 (L) 02/20/2021 0340   HGB 8.9 (L) 02/20/2021 0340   HCT 28.6 (L) 02/20/2021 0340   HCT 26.2 (L) 01/29/2020 0158   PLT 354 02/20/2021 0340   MCV 80.8 02/20/2021 0340   MCH 25.1 (L) 02/20/2021 0340   MCHC 31.1 02/20/2021 0340   RDW 15.9 (H) 02/20/2021 0340   LYMPHSABS 2.2 01/24/2020 0619   MONOABS 0.8 01/24/2020 0619   EOSABS 0.1 01/24/2020 0619   BASOSABS 0.0 01/24/2020 0619    CMP Latest Ref Rng & Units 02/20/2021 02/18/2021 02/17/2021  Glucose 70 - 99 mg/dL - 62(L) 152(H)  BUN 8 - 23 mg/dL - 53(H) 61(H)  Creatinine 0.61 - 1.24 mg/dL 1.50(H) 1.88(H) 2.22(H)  Sodium 135 - 145 mmol/L - 138 134(L)  Potassium 3.5 - 5.1 mmol/L - 3.9 5.5(H)  Chloride 98 - 111 mmol/L - 110 104  CO2 22 - 32 mmol/L - 21(L) 19(L)  Calcium 8.9 - 10.3 mg/dL - 8.6(L) 9.1  Total Protein 6.5 - 8.1 g/dL - 5.9(L) 6.6  Total Bilirubin 0.3 - 1.2 mg/dL - 0.8 1.6(H)  Alkaline Phos 38 - 126 U/L - 200(H) 210(H)  AST 15 - 41 U/L - 30 48(H)  ALT 0 - 44 U/L - 27 31      Microbiology: Recent Results (from the past 240 hour(s))  Resp Panel by RT-PCR (Flu A&B, Covid) Nasopharyngeal Swab     Status: None   Collection Time: 02/17/21  2:11 PM    Specimen: Nasopharyngeal Swab; Nasopharyngeal(NP) swabs in vial transport medium  Result Value Ref Range Status   SARS Coronavirus 2 by RT PCR NEGATIVE NEGATIVE Final    Comment: (NOTE) SARS-CoV-2 target nucleic acids are NOT DETECTED.  The SARS-CoV-2 RNA is generally detectable in upper respiratory specimens during the acute phase of infection. The lowest concentration of SARS-CoV-2 viral copies this assay can detect is 138 copies/mL. A negative result does not preclude SARS-Cov-2 infection and should not be used as the sole basis for treatment or other patient management decisions. A negative result may occur with  improper specimen collection/handling, submission of specimen other than nasopharyngeal swab, presence of viral mutation(s) within the areas targeted by this assay, and inadequate number of viral copies(<138 copies/mL). A negative result must be combined with clinical observations, patient history, and epidemiological information. The expected result is Negative.  Fact Sheet for Patients:  EntrepreneurPulse.com.au  Fact Sheet for Healthcare Providers:  IncredibleEmployment.be  This test is no t yet approved or cleared by the [place] FDA and  has been authorized for detection  and/or diagnosis of SARS-CoV-2 by FDA under an Emergency Use Authorization (EUA). This EUA will remain  in effect (meaning this test can be used) for the duration of the COVID-19 declaration under Section 564(b)(1) of the Act, 21 U.S.C.section 360bbb-3(b)(1), unless the authorization is terminated  or revoked sooner.       Influenza A by PCR NEGATIVE NEGATIVE Final   Influenza B by PCR NEGATIVE NEGATIVE Final    Comment: (NOTE) The Xpert Xpress SARS-CoV-2/FLU/RSV plus assay is intended as an aid in the diagnosis of influenza from Nasopharyngeal swab specimens and should not be used as a sole basis for treatment. Nasal washings and aspirates are  unacceptable for Xpert Xpress SARS-CoV-2/FLU/RSV testing.  Fact Sheet for Patients: EntrepreneurPulse.com.au  Fact Sheet for Healthcare Providers: IncredibleEmployment.be  This test is not yet approved or cleared by the [place] FDA and has been authorized for detection and/or diagnosis of SARS-CoV-2 by FDA under an Emergency Use Authorization (EUA). This EUA will remain in effect (meaning this test can be used) for the duration of the COVID-19 declaration under Section 564(b)(1) of the Act, 21 U.S.C. section 360bbb-3(b)(1), unless the authorization is terminated or revoked.  Performed at [facility], [address] [place], [place] [address]   Blood culture (routine x 2)     Status: Abnormal   Collection Time: 02/17/21  3:19 PM   Specimen: BLOOD  Result Value Ref Range Status   Specimen Description   Final    BLOOD RIGHT ANTECUBITAL Performed at [facility], [address] [place], [place] [address]    Special Requests   Final    BOTTLES DRAWN AEROBIC AND ANAEROBIC Blood Culture results may not be optimal due to an inadequate volume of blood received in culture bottles Performed at [facility], [address] [place], [place] [address]    Culture  Setup Time   Final    Organism ID to follow [place] TO, READ BACK BY AND VERIFIED WITH: [facility] MADE 02/18/21 [place] Performed at [facility], [address] [place], [place] [address]    Culture AEROCOCCUS VIRIDANS (A)  Final   Report Status 02/20/2021 FINAL  Final  Blood Culture ID Panel (Reflexed)     Status: None   Collection Time: 02/17/21  3:19 PM  Result Value Ref Range Status   Enterococcus faecalis NOT DETECTED NOT DETECTED Final   Enterococcus Faecium NOT DETECTED NOT DETECTED Final   Listeria monocytogenes NOT DETECTED NOT DETECTED Final   Staphylococcus species NOT  DETECTED NOT DETECTED Final   Staphylococcus aureus (BCID) NOT DETECTED NOT DETECTED Final   Staphylococcus epidermidis NOT DETECTED NOT DETECTED Final   Staphylococcus lugdunensis NOT DETECTED NOT DETECTED Final   Streptococcus species NOT DETECTED NOT DETECTED Final   Streptococcus agalactiae NOT DETECTED NOT DETECTED Final   Streptococcus pneumoniae NOT DETECTED NOT DETECTED Final   Streptococcus pyogenes NOT DETECTED NOT DETECTED Final   A.calcoaceticus-baumannii NOT DETECTED NOT DETECTED Final   Bacteroides fragilis NOT DETECTED NOT DETECTED Final   Enterobacterales NOT DETECTED NOT DETECTED Final   Enterobacter cloacae complex NOT DETECTED NOT DETECTED Final   Escherichia coli NOT DETECTED NOT DETECTED Final   Klebsiella aerogenes NOT DETECTED NOT DETECTED Final   Klebsiella oxytoca NOT DETECTED NOT DETECTED Final   Klebsiella pneumoniae NOT DETECTED NOT DETECTED Final   Proteus species NOT DETECTED NOT DETECTED Final   Salmonella species NOT DETECTED NOT DETECTED Final  Serratia marcescens NOT DETECTED NOT DETECTED Final   Haemophilus influenzae NOT DETECTED NOT DETECTED Final   Neisseria meningitidis NOT DETECTED NOT DETECTED Final   Pseudomonas aeruginosa NOT DETECTED NOT DETECTED Final   Stenotrophomonas maltophilia NOT DETECTED NOT DETECTED Final   Candida albicans NOT DETECTED NOT DETECTED Final   Candida auris NOT DETECTED NOT DETECTED Final   Candida glabrata NOT DETECTED NOT DETECTED Final   Candida krusei NOT DETECTED NOT DETECTED Final   Candida parapsilosis NOT DETECTED NOT DETECTED Final   Candida tropicalis NOT DETECTED NOT DETECTED Final   Cryptococcus neoformans/gattii NOT DETECTED NOT DETECTED Final    Comment: Performed at [facility], [address] [place], [place] [address]  Blood culture (routine x 2)     Status: None (Preliminary result)   Collection Time: 02/17/21  3:25 PM   Specimen: BLOOD  Result Value Ref Range Status   Specimen  Description BLOOD BLOOD LEFT HAND  Final   Special Requests   Final    BOTTLES DRAWN AEROBIC ONLY Blood Culture results may not be optimal due to an inadequate volume of blood received in culture bottles   Culture   Final    NO GROWTH 3 DAYS Performed at [facility], [address], [place], [place] [address]    Report Status PENDING  Incomplete    IMAGING RESULTS: CXR  . Chronic interstitial lung disease. 2. Superimposed airspace opacity within the left lower lobecompatible with pneumonia. Followup PA and lateral chest X-ray is recommended in 3-4 weeks following trial of antibiotic therapy to ensure resolution and exclude underlying malignancy.    01/26/20  appearance of the lungs is compatible with interstitial lung disease, with a spectrum of findings considered diagnostic of usual interstitial pneumonia (UIP) per current ATS guidelines. Minimal progression compared to the prior study.  I have personally reviewed the films ? Impression/Recommendation  Agitation/does not want to bothered- not sure what his baseline is Aerococcus viridans bacteremia- only in 1/4 anerobic bottle- could be a true pathogen VS contaminant Because of emaciation, liver mass he could have malignancy possible GI and this could be a true pathogen Ceftriaxone will cover this Occasionally known to cause endocarditis   Malnutrition-  Emaciation- liver mass on [place]- likely malignancy Chronic anemia- B12 ( very high) /folate okay Iron low, ferritin very high Recommend CT abd/pelvis  CKD Increased alkaline phophatase- likely due to liver mass   Interstitial lung disease With possible consolidation left lung Okay to give ceftriaxone and azithro  Leucocytosis chronic   schizophrenia  H/o CAD s/p CABG  Recommend Palliative consult   ___________________________________________________ Discussed management with requesting provider  ID will follow him peripherally this weekend- call if  needed Note:  This document was prepared using Dragon voice recognition software and may include unintentional dictation errors.

## 2021-02-21 ENCOUNTER — Inpatient Hospital Stay: Payer: Medicare Other

## 2021-02-21 DIAGNOSIS — N1832 Chronic kidney disease, stage 3b: Secondary | ICD-10-CM | POA: Diagnosis not present

## 2021-02-21 DIAGNOSIS — C787 Secondary malignant neoplasm of liver and intrahepatic bile duct: Secondary | ICD-10-CM

## 2021-02-21 DIAGNOSIS — R7989 Other specified abnormal findings of blood chemistry: Secondary | ICD-10-CM | POA: Diagnosis not present

## 2021-02-21 DIAGNOSIS — J189 Pneumonia, unspecified organism: Secondary | ICD-10-CM | POA: Diagnosis not present

## 2021-02-21 DIAGNOSIS — N184 Chronic kidney disease, stage 4 (severe): Secondary | ICD-10-CM | POA: Diagnosis not present

## 2021-02-21 LAB — GLUCOSE, CAPILLARY
Glucose-Capillary: 103 mg/dL — ABNORMAL HIGH (ref 70–99)
Glucose-Capillary: 105 mg/dL — ABNORMAL HIGH (ref 70–99)
Glucose-Capillary: 88 mg/dL (ref 70–99)
Glucose-Capillary: 93 mg/dL (ref 70–99)
Glucose-Capillary: 97 mg/dL (ref 70–99)

## 2021-02-21 LAB — TSH: TSH: 4.936 u[IU]/mL — ABNORMAL HIGH (ref 0.350–4.500)

## 2021-02-21 LAB — CORTISOL-AM, BLOOD: Cortisol - AM: 30.1 ug/dL — ABNORMAL HIGH (ref 6.7–22.6)

## 2021-02-21 MED ORDER — GADOBUTROL 1 MMOL/ML IV SOLN
6.0000 mL | Freq: Once | INTRAVENOUS | Status: AC | PRN
  Administered 2021-02-21: 6 mL via INTRAVENOUS

## 2021-02-21 NOTE — Progress Notes (Signed)
PROGRESS NOTE    Robert Little  JQB:[id number] DOB: 08/14/1940 DOA: 02/17/2021 PCP: [name]    Brief Narrative:  Robert Little is a 80 y.o. male with medical history significant for schizophrenia, insulin-dependent diabetes mellitus, anxiety, hyperlipidemia, GERD, hypertension, CAD, CKD 3/CKD 4, chronic anemia, presents to the emergency department from facility for chief concerns of weakness.Patient is irritable and easily annoyed with questions and does not want to fully participate in physical exam  8/17- in foul mood this am. Yells back with any answers.Unable to obtain good history.BG 67 this am 8/18 has no complaints. Less yelling today 8/19 blood culture Aerococcus viridans. 8/20 no overnight issues. MRI completed  Consultants:  ID  Procedures:  Hepatic MRI  Antimicrobials:   Ceftriaxone,, azith   Subjective: No overnight issues.  Patient is irritable and yells during exam.  Objective: Vitals:   02/20/21 2127 02/20/21 2326 02/21/21 0527 02/21/21 0804  BP: 123/63 116/88 136/76 128/71  Pulse: (!) 101 92 (!) 105 (!) 101  Resp:  20 20 19   Temp: 99 F (37.2 C) 98.8 F (37.1 C) 98.8 F (37.1 C) 99.3 F (37.4 C)  TempSrc:  Oral Oral   SpO2: 97% 94% 95% 99%  Weight:      Height:        Intake/Output Summary (Last 24 hours) at 02/21/2021 0825 Last data filed at 02/21/2021 0529 Gross per 24 hour  Intake 0 ml  Output 800 ml  Net -800 ml   Filed Weights   02/17/21 1359  Weight: 66 kg    Examination: NAD, sleepy this AM, CTA anteriorly no wheezing Regular S1-S2 no gallops Soft benign positive bowel sounds No edema Mood and affect appropriate in current setting    Data Reviewed: I have personally reviewed following labs and imaging studies  CBC: Recent Labs  Lab 02/17/21 1343 02/18/21 0403 02/19/21 0501 02/20/21 0340  WBC 17.9* 19.1* 17.6* 17.8*  HGB 7.4* 6.4* 8.3* 8.9*  HCT 24.2* 21.0* 27.0* 28.6*  MCV 84.0 84.0 84.9 80.8   PLT 384 355 314 409   Basic Metabolic Panel: Recent Labs  Lab 02/17/21 1343 02/18/21 0403 02/20/21 0340  NA 134* 138  --   K 5.5* 3.9  --   CL 104 110  --   CO2 19* 21*  --   GLUCOSE 152* 62*  --   BUN 61* 53*  --   CREATININE 2.22* 1.88* 1.50*  CALCIUM 9.1 8.6*  --    GFR: Estimated Creatinine Clearance: 37.9 mL/min (A) (by C-G formula based on SCr of 1.5 mg/dL (H)). Liver Function Tests: Recent Labs  Lab 02/17/21 1343 02/18/21 0403  AST 48* 30  ALT 31 27  ALKPHOS 210* 200*  BILITOT 1.6* 0.8  PROT 6.6 5.9*  ALBUMIN 2.1* 2.0*   No results for input(s): LIPASE, AMYLASE in the last 168 hours. No results for input(s): AMMONIA in the last 168 hours. Coagulation Profile: No results for input(s): INR, PROTIME in the last 168 hours. Cardiac Enzymes: No results for input(s): CKTOTAL, CKMB, CKMBINDEX, TROPONINI in the last 168 hours. BNP (last 3 results) No results for input(s): PROBNP in the last 8760 hours. HbA1C: No results for input(s): HGBA1C in the last 72 hours.  CBG: Recent Labs  Lab 02/20/21 0817 02/20/21 1203 02/20/21 1631 02/21/21 0011 02/21/21 0757  GLUCAP 98 115* 97 88 105*   Lipid Profile: No results for input(s): CHOL, HDL, LDLCALC, TRIG, CHOLHDL, LDLDIRECT in the last 72 hours. Thyroid Function Tests:  Recent Labs    02/21/21 0508  TSH 4.936*   Anemia Panel: No results for input(s): VITAMINB12, FOLATE, FERRITIN, TIBC, IRON, RETICCTPCT in the last 72 hours.  Sepsis Labs: No results for input(s): PROCALCITON, LATICACIDVEN in the last 168 hours.  Recent Results (from the past 240 hour(s))  Resp Panel by RT-PCR (Flu A&B, Covid) Nasopharyngeal Swab     Status: None   Collection Time: 02/17/21  2:11 PM   Specimen: Nasopharyngeal Swab; Nasopharyngeal(NP) swabs in vial transport medium  Result Value Ref Range Status   SARS Coronavirus 2 by RT PCR NEGATIVE NEGATIVE Final    Comment: (NOTE) SARS-CoV-2 target nucleic acids are NOT DETECTED.  The  SARS-CoV-2 RNA is generally detectable in upper respiratory specimens during the acute phase of infection. The lowest concentration of SARS-CoV-2 viral copies this assay can detect is 138 copies/mL. A negative result does not preclude SARS-Cov-2 infection and should not be used as the sole basis for treatment or other patient management decisions. A negative result may occur with  improper specimen collection/handling, submission of specimen other than nasopharyngeal swab, presence of viral mutation(s) within the areas targeted by this assay, and inadequate number of viral copies(<138 copies/mL). A negative result must be combined with clinical observations, patient history, and epidemiological information. The expected result is Negative.  Fact Sheet for Patients:  EntrepreneurPulse.com.au  Fact Sheet for Healthcare Providers:  IncredibleEmployment.be  This test is no t yet approved or cleared by the [place] FDA and  has been authorized for detection and/or diagnosis of SARS-CoV-2 by FDA under an Emergency Use Authorization (EUA). This EUA will remain  in effect (meaning this test can be used) for the duration of the COVID-19 declaration under Section 564(b)(1) of the Act, 21 U.S.C.section 360bbb-3(b)(1), unless the authorization is terminated  or revoked sooner.       Influenza A by PCR NEGATIVE NEGATIVE Final   Influenza B by PCR NEGATIVE NEGATIVE Final    Comment: (NOTE) The Xpert Xpress SARS-CoV-2/FLU/RSV plus assay is intended as an aid in the diagnosis of influenza from Nasopharyngeal swab specimens and should not be used as a sole basis for treatment. Nasal washings and aspirates are unacceptable for Xpert Xpress SARS-CoV-2/FLU/RSV testing.  Fact Sheet for Patients: EntrepreneurPulse.com.au  Fact Sheet for Healthcare Providers: IncredibleEmployment.be  This test is not yet approved or  cleared by the [place] FDA and has been authorized for detection and/or diagnosis of SARS-CoV-2 by FDA under an Emergency Use Authorization (EUA). This EUA will remain in effect (meaning this test can be used) for the duration of the COVID-19 declaration under Section 564(b)(1) of the Act, 21 U.S.C. section 360bbb-3(b)(1), unless the authorization is terminated or revoked.  Performed at [facility], [address] [place], [place] [address]   Blood culture (routine x 2)     Status: Abnormal   Collection Time: 02/17/21  3:19 PM   Specimen: BLOOD  Result Value Ref Range Status   Specimen Description   Final    BLOOD RIGHT ANTECUBITAL Performed at [facility], [address] [place], [place] [address]    Special Requests   Final    BOTTLES DRAWN AEROBIC AND ANAEROBIC Blood Culture results may not be optimal due to an inadequate volume of blood received in culture bottles Performed at [facility], [address], [place], [place] [address]    Culture  Setup Time   Final    Organism ID to follow [place]  ONLY CRITICAL RESULT CALLED TO, READ BACK BY AND VERIFIED WITH: DEVAN MADE 02/18/21 0851 KLW GRAM POSITIVE RODS AEROBIC BOTTLE ONLY CRITICAL RESULT CALLED TO, READ BACK BY AND VERIFIED WITH: BRANDON BEERS 02/21/2021 DLB    Culture AEROCOCCUS VIRIDANS GRAM POSITIVE RODS  (A)  Final   Report Status 02/20/2021 FINAL  Final  Blood Culture ID Panel (Reflexed)     Status: None   Collection Time: 02/17/21  3:19 PM  Result Value Ref Range Status   Enterococcus faecalis NOT DETECTED NOT DETECTED Final   Enterococcus Faecium NOT DETECTED NOT DETECTED Final   Listeria monocytogenes NOT DETECTED NOT DETECTED Final   Staphylococcus species NOT DETECTED NOT DETECTED Final   Staphylococcus aureus (BCID) NOT DETECTED NOT DETECTED Final   Staphylococcus epidermidis NOT DETECTED NOT DETECTED Final   Staphylococcus lugdunensis NOT  DETECTED NOT DETECTED Final   Streptococcus species NOT DETECTED NOT DETECTED Final   Streptococcus agalactiae NOT DETECTED NOT DETECTED Final   Streptococcus pneumoniae NOT DETECTED NOT DETECTED Final   Streptococcus pyogenes NOT DETECTED NOT DETECTED Final   A.calcoaceticus-baumannii NOT DETECTED NOT DETECTED Final   Bacteroides fragilis NOT DETECTED NOT DETECTED Final   Enterobacterales NOT DETECTED NOT DETECTED Final   Enterobacter cloacae complex NOT DETECTED NOT DETECTED Final   Escherichia coli NOT DETECTED NOT DETECTED Final   Klebsiella aerogenes NOT DETECTED NOT DETECTED Final   Klebsiella oxytoca NOT DETECTED NOT DETECTED Final   Klebsiella pneumoniae NOT DETECTED NOT DETECTED Final   Proteus species NOT DETECTED NOT DETECTED Final   Salmonella species NOT DETECTED NOT DETECTED Final   Serratia marcescens NOT DETECTED NOT DETECTED Final   Haemophilus influenzae NOT DETECTED NOT DETECTED Final   Neisseria meningitidis NOT DETECTED NOT DETECTED Final   Pseudomonas aeruginosa NOT DETECTED NOT DETECTED Final   Stenotrophomonas maltophilia NOT DETECTED NOT DETECTED Final   Candida albicans NOT DETECTED NOT DETECTED Final   Candida auris NOT DETECTED NOT DETECTED Final   Candida glabrata NOT DETECTED NOT DETECTED Final   Candida krusei NOT DETECTED NOT DETECTED Final   Candida parapsilosis NOT DETECTED NOT DETECTED Final   Candida tropicalis NOT DETECTED NOT DETECTED Final   Cryptococcus neoformans/gattii NOT DETECTED NOT DETECTED Final    Comment: Performed at [facility], [address] [place], [place] [address]  Blood culture (routine x 2)     Status: None (Preliminary result)   Collection Time: 02/17/21  3:25 PM   Specimen: BLOOD  Result Value Ref Range Status   Specimen Description BLOOD BLOOD LEFT HAND  Final   Special Requests   Final    BOTTLES DRAWN AEROBIC ONLY Blood Culture results may not be optimal due to an inadequate volume of blood received in  culture bottles   Culture   Final    NO GROWTH 4 DAYS Performed at [facility], [address] [place], [place] [address]    Report Status PENDING  Incomplete         Radiology Studies: MR LIVER W WO CONTRAST  Result Date: 02/21/2021 CLINICAL DATA:  Elevated liver function tests. Ultrasound demonstrating a heterogeneous right liver lobe mass. EXAM: MRI ABDOMEN WITHOUT AND WITH CONTRAST TECHNIQUE: Multiplanar multisequence MR imaging of the abdomen was performed both before and after the administration of intravenous contrast. CONTRAST:  80mL GADAVIST GADOBUTROL 1 MMOL/ML IV SOLN COMPARISON:  Abdominal ultrasound of 02/17/2021. FINDINGS: Portions of exam are moderately motion degraded. Lower chest: Tiny bilateral pleural effusions. Node within the azygoesophageal recess measures  1.9 cm on 01/04. A right lower lobe lung mass measures 4.0 x 3.6 cm on 01/04. Left posterior chest wall mass measures 4.2 cm on 03/04. Mild cardiomegaly with prior median sternotomy. Hepatobiliary: Mass centered in the posterior right hepatic lobe, primarily segments 6 and 7, measures on the order of 9.8 x 7.2 cm on 33/15. Hepatomegaly at 18.6 cm craniocaudal. Normal gallbladder, without biliary ductal dilatation. Pancreas:  Normal, without mass or ductal dilatation. Spleen:  Normal in size, without focal abnormality. Adrenals/Urinary Tract: Left adrenal lesion of 2.2 cm on 41/15. Mild right adrenal thickening. Left larger than right renal cysts. No enhancing renal mass or hydronephrosis. Stomach/Bowel: Proximal gastric underdistention. Normal abdominal bowel loops. Vascular/Lymphatic: Aortic atherosclerosis. No abdominal adenopathy. Other: No ascites. A right pericolic gutter 8 mm nodule on 74/15 corresponds to restricted diffusion on 67/7, likely indicative of peritoneal metastasis. A subcutaneous left-sided chest wall nodule of 8 mm on 28/15 also demonstrates restricted diffusion on 47/7 and likely represents a  metastasis. Musculoskeletal: Extensive osseous metastasis, including within the L3 vertebral body on 25/19 and 10/3. Left-sided L1 and inferior T10 metastasis also identified. IMPRESSION: 1. Widespread metastatic disease, including to the liver, bones, peritoneum, thoracic nodes, and subcutaneous tissues. Favor right lower lobe primary bronchogenic carcinoma. Consider multidisciplinary thoracic oncology consultation. 2. Probable adrenal metastasis. 3. Moderately motion degraded exam. 4. Small bilateral pleural effusions. 5.  Aortic Atherosclerosis (ICD10-I70.0). Electronically Signed   By: Abigail Miyamoto M.D.   On: 02/21/2021 07:04        Scheduled Meds:  atorvastatin  20 mg Oral QHS   dextromethorphan-guaiFENesin  1 tablet Oral BID   enoxaparin (LOVENOX) injection  40 mg Subcutaneous Q24H   feeding supplement  237 mL Oral TID BM   ferrous sulfate  325 mg Oral BID WC   fluticasone furoate-vilanterol  1 puff Inhalation Daily   insulin aspart  0-5 Units Subcutaneous QHS   insulin aspart  0-9 Units Subcutaneous TID WC   insulin glargine-yfgn  5 Units Subcutaneous QHS   multivitamin with minerals  1 tablet Oral Daily   pantoprazole  40 mg Oral Daily   QUEtiapine  25 mg Oral Daily   tamsulosin  0.4 mg Oral QPC supper   umeclidinium bromide  1 puff Inhalation Daily   vitamin B-12  500 mcg Oral BID   Continuous Infusions:  azithromycin 500 mg (02/20/21 1210)   cefTRIAXone (ROCEPHIN)  IV 1 g (02/20/21 1124)    Assessment & Plan:   Principal Problem:   Left lower lobe pneumonia Active Problems:   Essential hypertension   Schizo affective schizophrenia ([place])   HLD (hyperlipidemia)   CKD (chronic kidney disease), stage IV (HCC)   Malnutrition of moderate degree   Elevated troponin   Paroxysmal atrial fibrillation (HCC)   Neutrophilic leukocytosis   Anemia due to chronic kidney disease   Anemia due to stage 4 chronic kidney disease (HCC)   Elevated LFTs   Protein-calorie malnutrition,  severe   # Left lower lobe pneumonia per chest x-ray read - Patient did endorse cough with me, unclear if it is productive - COVID/influenza A/influenza B PCR were negative 8/19 continue ceftriaxone and azithromycin  PT consulted but difficult to perform as patient has agitated behavior and yelling at the staff  +bcx with aer. Viridans. ID consulted.  8/20 IDs input was appreciated.  1 out of 4 anaerobic bottle could be true pathogen versus contaminant.  Due to the liver mass this possibly could be a true pathogen.  Ceftriaxone will cover this and will continue. Occasional known to cause endocarditis. Will obtain echocardiogram        # Acute on chronic anemia - Hemoglobin ranged of 7.7-8.6 in 2021 - CBC in the a.m. - Patient denies nausea vomiting, black stool, bleeding and is otherwise hemodynamically stable 8/17- continue iron pills Ck stool occult Hg 6.4, spoke to legal guardian [name] and discussed transfusion and possible complications and he is agreeable. Told me pt was transfused last year without any issues. Will transfuse 1prbc, ck post h/h, if <7 will give a second unit DC heparin subcu and aspirin until GI bleed ruled out 8/20 H&H has remained stable  Stool occult negative  We will continue to monitor periodically        # History of chronic leukocytosis - WBC has been chronically elevated in 2021 - This is the highest level its been 8/19- possibly due to infection  Continue to monitor      # Elevated liver enzymes Improved Ruq ZF:POIPP heterogeneous liver mass, worrisome for the presence of an underlying neoplasm. Further evaluation with hepatic MRI is recommended. 8/20-MRI with hepatic revealed widespread mets disease including to liver, bones, peritoneum, thoracic nodes, subcutaneous tissue.  Stable right lower lobe primary bronchogenic carcinoma per radiology  We will consult oncology      # Severe protein/calorie malnutrition-resumed  Ensure supplements   # Elevated troponin with downtime trending delta troponin - Patient denies chest pain and shortness of breath at this time - Low clinical suspicion for ACS Likely due to demand ischemia   # Insulin-dependent diabetes mellitus-I suspect this is type II - Resumed home long-acting 10 units nightly - Insulin SSI with at bedtime coverage ordered A1c 6.9  # Query dementia # Schizophrenia with schizoaffective disorder - Resumed home quetiapine 25 mg p.o. daily   # Anxiety-resume Klonopin 0.25 mg p.o. twice daily as needed for anxiety      DVT prophylaxis: SCD. lovenox Code Status: DNR Family Communication: Communicated with the legal guardian.  Discussed MRI results and oncology to be consulted. disposition Plan:  Status is: Inpatient   The patient remains inpatient due to severity of illness and requiring IV treatment  Dispo: The patient is from: Group home              Anticipated d/c is to: Group home              Patient currently is not medically stable to d/c.   Difficult to place patient No  Needs IV antibiotics.  Oncology consulted.         LOS: 3 days   Time spent: 35 minutes with more than 50% on [place], MD [facility] Pager [phone]  If 7PM-7AM, please contact night-coverage 02/21/2021, 8:25 AM

## 2021-02-21 NOTE — Assessment & Plan Note (Addendum)
#  80 year old male patient with multiple medical problems including schizophrenia/group home resident-lung fibrosis/CAD/CKD-currently admitted to hospital for left lower lobe pneumonia; on further work-up noted to have multiple liver lesions.  #Right lower lobe lung mass; multiple liver lesions-clinically highly suggestive of lung malignancy with metastasis to liver.  #Left lower lobe pneumonia-on antibiotics  #Multiple comorbidities CAD; CKD; schizophrenia  Recommendations/plan:  #Spoke with patient's legal guardian Mr. Hillis Range significant concerns for malignancy given the findings on imaging.  Discussed that given patient's multiple comorbidities/psychiatric condition patient is not a candidate for any treatment for cancer.  I would not recommend biopsy or further work-up for malignancy as this would not change patient's final management.    #Also discussed DNR DNI  #After lengthy discussion with legal guardian recommends continued current scope of therapy.  We will request palliative care evaluation/ on 8/22.  Thank you Dr.Amery for allowing me to participate in the care of your pleasant patient. Please do not hesitate to contact me with questions or concerns in the interim. Discussed with Dr.Amery.

## 2021-02-21 NOTE — Progress Notes (Signed)
PHARMACY - PHYSICIAN COMMUNICATION CRITICAL VALUE ALERT - BLOOD CULTURE IDENTIFICATION (BCID)  Robert Little is an 80 y.o. male who presented to [facility] on [facility]/17/2021 with a chief complaint of weakness, lethargy, cough  Assessment:   8/20 update to blood cx with GPR in 1 of 4 bottles in addition to GPCs from before that speciated Aerococcus viridans, BCID - nothing new detected. Continues on CTX/Azith. Pending speciation of GPRs, most common corynebacterium, listeria, bacillus, clostridium,etc. No current recommendation to broaden coverage.  Name of physician (or Provider) Contacted: Dr Judd Gaudier  8/16 blood cx with GPC in 1 of 4 bottles, BCID - nothing detected.  On antibiotics for CAP. Name of physician (or Provider) Contacted: Dr Kurtis Bushman  Current antibiotics: Ceftriaxone/azithromcyin  Changes to prescribed antibiotics recommended:  Patient is on recommended antibiotics - No changes needed.  Monitor with current antibiotics. Suspect blood cx is contaminant  Results for orders placed or performed during the hospital encounter of 02/17/21  Blood Culture ID Panel (Reflexed) (Collected: 02/17/2021  3:19 PM)  Result Value Ref Range   Enterococcus faecalis NOT DETECTED NOT DETECTED   Enterococcus Faecium NOT DETECTED NOT DETECTED   Listeria monocytogenes NOT DETECTED NOT DETECTED   Staphylococcus species NOT DETECTED NOT DETECTED   Staphylococcus aureus (BCID) NOT DETECTED NOT DETECTED   Staphylococcus epidermidis NOT DETECTED NOT DETECTED   Staphylococcus lugdunensis NOT DETECTED NOT DETECTED   Streptococcus species NOT DETECTED NOT DETECTED   Streptococcus agalactiae NOT DETECTED NOT DETECTED   Streptococcus pneumoniae NOT DETECTED NOT DETECTED   Streptococcus pyogenes NOT DETECTED NOT DETECTED   A.calcoaceticus-baumannii NOT DETECTED NOT DETECTED   Bacteroides fragilis NOT DETECTED NOT DETECTED   Enterobacterales NOT DETECTED NOT DETECTED   Enterobacter cloacae complex NOT DETECTED  NOT DETECTED   Escherichia coli NOT DETECTED NOT DETECTED   Klebsiella aerogenes NOT DETECTED NOT DETECTED   Klebsiella oxytoca NOT DETECTED NOT DETECTED   Klebsiella pneumoniae NOT DETECTED NOT DETECTED   Proteus species NOT DETECTED NOT DETECTED   Salmonella species NOT DETECTED NOT DETECTED   Serratia marcescens NOT DETECTED NOT DETECTED   Haemophilus influenzae NOT DETECTED NOT DETECTED   Neisseria meningitidis NOT DETECTED NOT DETECTED   Pseudomonas aeruginosa NOT DETECTED NOT DETECTED   Stenotrophomonas maltophilia NOT DETECTED NOT DETECTED   Candida albicans NOT DETECTED NOT DETECTED   Candida auris NOT DETECTED NOT DETECTED   Candida glabrata NOT DETECTED NOT DETECTED   Candida krusei NOT DETECTED NOT DETECTED   Candida parapsilosis NOT DETECTED NOT DETECTED   Candida tropicalis NOT DETECTED NOT DETECTED   Cryptococcus neoformans/gattii NOT DETECTED NOT DETECTED    Lorna Dibble, RPH,BCCP  Work Cell: [phone] 02/21/2021 1:58 AM

## 2021-02-21 NOTE — Consult Note (Signed)
[facility] CONSULT NOTE  Patient Care Team: Care, [name] [facility] as PCP - General (Family Medicine) Croitoru, Dani Gobble, MD as PCP - Cardiology (Cardiology) Barrett, Evelene Croon, PA-C as Physician Assistant (Cardiology)  CHIEF COMPLAINTS/PURPOSE OF CONSULTATION:   HISTORY OF PRESENTING ILLNESS:  Robert Little 80 y.o.  male with history of schizophrenia; CKD; CAD history of lung fibrosis-group home resident is currently admitted to hospital for generalized weakness.  Chest x-ray on admission showed-left lung pneumonia.  Patient on antibiotics.  However patient had ultrasound of the abdomen for elevated LFTs.  The ultrasound showed approximately a centimeter mass in the right lobe of the liver.  This led to an MRI MRI of the liver that showed the large mass in the liver and also multiple lesions and also a right lung lobe mass-all concerning for malignancy.  Oncology has been consulted for further evaluation recommendations.  Review of Systems  Unable to perform ROS: Psychiatric disorder    MEDICAL HISTORY:  Past Medical History:  Diagnosis Date   Anemia    Chronic diastolic CHF (congestive heart failure) (HCC)    Chronic respiratory failure (HCC)    CKD (chronic kidney disease), stage IV ([place])    Coronary artery disease    CABG 2005 at [facility]   COVID-19 11/2019   pneumonia   Diabetes mellitus (HCC)    Dysphagia    GERD (gastroesophageal reflux disease)    Hypertension    Lung nodule    Pulmonary fibrosis (HCC)    Schizo affective schizophrenia ([place])    Tobacco abuse     SURGICAL HISTORY: Past Surgical History:  Procedure Laterality Date   CORONARY ARTERY BYPASS GRAFT      SOCIAL HISTORY: Social History   Socioeconomic History   Marital status: Unknown    Spouse name: Not on file   Number of children: Not on file   Years of education: Not on file   Highest education level: Not on file  Occupational History   Not on file  Tobacco Use   Smoking  status: Former    Types: Cigarettes   Smokeless tobacco: Never  Vaping Use   Vaping Use: Never used  Substance and Sexual Activity   Alcohol use: Not Currently   Drug use: Not Currently   Sexual activity: Not Currently  Other Topics Concern   Not on file  Social History Narrative   Not on file   Social Determinants of Health   Financial Resource Strain: Not on file  Food Insecurity: Not on file  Transportation Needs: Not on file  Physical Activity: Not on file  Stress: Not on file  Social Connections: Not on file  Intimate Partner Violence: Not on file    FAMILY HISTORY: Family History  Problem Relation Age of Onset   Diabetes Mellitus II Mother    Diabetes Mellitus II Maternal Grandmother    Heart disease Maternal Grandmother     ALLERGIES:  has No Known Allergies.  MEDICATIONS:  Current Facility-Administered Medications  Medication Dose Route Frequency Provider Last Rate Last Admin   atorvastatin (LIPITOR) tablet 20 mg  20 mg Oral QHS Cox, Amy N, DO   20 mg at 02/20/21 2129   azithromycin (ZITHROMAX) 500 mg in sodium chloride 0.9 % 250 mL IVPB  500 mg Intravenous Q24H Cox, Amy N, DO 250 mL/hr at 02/21/21 0900 500 mg at 02/21/21 0900   cefTRIAXone (ROCEPHIN) 1 g in sodium chloride 0.9 % 100 mL IVPB  1 g Intravenous  Q24H Cox, Amy N, DO 200 mL/hr at 02/21/21 0952 1 g at 02/21/21 0952   clonazePAM (KLONOPIN) disintegrating tablet 0.25 mg  0.25 mg Oral BID PRN Cox, Amy N, DO   0.25 mg at 02/17/21 2344   dextromethorphan-guaiFENesin (MUCINEX DM) 30-600 MG per 12 hr tablet 1 tablet  1 tablet Oral BID Cox, Amy N, DO   1 tablet at 02/21/21 0900   enoxaparin (LOVENOX) injection 40 mg  40 mg Subcutaneous Q24H Nolberto Hanlon, MD   40 mg at 02/20/21 2130   feeding supplement (ENSURE ENLIVE / ENSURE PLUS) liquid 237 mL  237 mL Oral TID BM Nolberto Hanlon, MD   237 mL at 02/21/21 1400   ferrous sulfate tablet 325 mg  325 mg Oral BID WC Cox, Amy N, DO   325 mg at 02/21/21 1744    fluticasone furoate-vilanterol (BREO ELLIPTA) 200-25 MCG/INH 1 puff  1 puff Inhalation Daily Cox, Amy N, DO   1 puff at 02/21/21 0900   insulin aspart (novoLOG) injection 0-5 Units  0-5 Units Subcutaneous QHS Cox, Amy N, DO       insulin aspart (novoLOG) injection 0-9 Units  0-9 Units Subcutaneous TID WC Cox, Amy N, DO   1 Units at 02/19/21 1735   insulin glargine-yfgn (SEMGLEE) injection 5 Units  5 Units Subcutaneous QHS Nolberto Hanlon, MD   5 Units at 02/19/21 2247   ipratropium-albuterol (DUONEB) 0.5-2.5 (3) MG/3ML nebulizer solution 3 mL  3 mL Nebulization Q4H PRN Nolberto Hanlon, MD       multivitamin with minerals tablet 1 tablet  1 tablet Oral Daily Cox, Amy N, DO   1 tablet at 02/21/21 0900   ondansetron (ZOFRAN) tablet 4 mg  4 mg Oral Q6H PRN Cox, Amy N, DO       Or   ondansetron (ZOFRAN) injection 4 mg  4 mg Intravenous Q6H PRN Cox, Amy N, DO       pantoprazole (PROTONIX) EC tablet 40 mg  40 mg Oral Daily Cox, Amy N, DO   40 mg at 02/21/21 0900   polyethylene glycol (MIRALAX / GLYCOLAX) packet 17 g  17 g Oral BID PRN Rito Ehrlich A, RPH       QUEtiapine (SEROQUEL) tablet 25 mg  25 mg Oral Daily Cox, Amy N, DO   25 mg at 02/21/21 0900   tamsulosin (FLOMAX) capsule 0.4 mg  0.4 mg Oral QPC supper Cox, Amy N, DO   0.4 mg at 02/21/21 1744   umeclidinium bromide (INCRUSE ELLIPTA) 62.5 MCG/INH 1 puff  1 puff Inhalation Daily Cox, Amy N, DO   1 puff at 02/21/21 0900   vitamin B-12 (CYANOCOBALAMIN) tablet 500 mcg  500 mcg Oral BID Cox, Amy N, DO   500 mcg at 02/21/21 0900      .  PHYSICAL EXAMINATION:  Vitals:   02/21/21 1939 02/21/21 2036  BP: 131/62 (!) 136/59  Pulse: 84 94  Resp: (!) 26 (!) 24  Temp: 98.6 F (37 C) 98.4 F (36.9 C)  SpO2: 100% 100%   Filed Weights   02/17/21 1359  Weight: 145 lb 8.1 oz (66 kg)    Physical Exam Vitals and nursing note reviewed.  Constitutional:      Comments: Patient resting in the bed.  Alone.    HENT:     Head: Normocephalic and  atraumatic.     Mouth/Throat:     Mouth: Mucous membranes are moist.     Pharynx: No oropharyngeal exudate.  Eyes:     Pupils: Pupils are equal, round, and reactive to light.  Cardiovascular:     Rate and Rhythm: Normal rate and regular rhythm.  Pulmonary:     Effort: No respiratory distress.     Breath sounds: No wheezing.     Comments: Decreased breath sounds bilaterally at bases.  No wheeze or crackles Abdominal:     General: Bowel sounds are normal. There is no distension.     Palpations: Abdomen is soft. There is no mass.     Tenderness: There is no abdominal tenderness. There is no guarding or rebound.  Musculoskeletal:        General: No tenderness. Normal range of motion.     Cervical back: Normal range of motion and neck supple.  Skin:    General: Skin is warm.  Neurological:     Mental Status: He is alert.     Comments: Mumbling; no meaningful conversation.  Psychiatric:        Mood and Affect: Affect normal.        Judgment: Judgment normal.     LABORATORY DATA:  I have reviewed the data as listed Lab Results  Component Value Date   WBC 17.8 (H) 02/20/2021   HGB 8.9 (L) 02/20/2021   HCT 28.6 (L) 02/20/2021   MCV 80.8 02/20/2021   PLT 354 02/20/2021   Recent Labs    02/17/21 1343 02/18/21 0403 02/20/21 0340  NA 134* 138  --   K 5.5* 3.9  --   CL 104 110  --   CO2 19* 21*  --   GLUCOSE 152* 62*  --   BUN 61* 53*  --   CREATININE 2.22* 1.88* 1.50*  CALCIUM 9.1 8.6*  --   GFRNONAA 30* 36* 47*  PROT 6.6 5.9*  --   ALBUMIN 2.1* 2.0*  --   AST 48* 30  --   ALT 31 27  --   ALKPHOS 210* 200*  --   BILITOT 1.6* 0.8  --   BILIDIR  --  0.2  --   IBILI  --  0.6  --     RADIOGRAPHIC STUDIES: I have personally reviewed the radiological images as listed and agreed with the findings in the report. DG Chest 2 View  Result Date: 02/17/2021 CLINICAL DATA:  Weakness. EXAM: CHEST - 2 VIEW COMPARISON:  01/30/2020 FINDINGS: Previous median sternotomy and CABG  procedure. Stable cardiomediastinal contours. Lung volumes are low. There is extensive bilateral lower lung zone predominant diffuse interstitial reticular opacities compatible with fibrotic lung disease. Superimposed airspace opacity is identified within the left lower lobe posteriorly. No pleural effusion or edema. Degenerative disc disease noted within the thoracic spine. IMPRESSION: 1. Chronic interstitial lung disease. 2. Superimposed airspace opacity within the left lower lobe compatible with pneumonia. Followup PA and lateral chest X-ray is recommended in 3-4 weeks following trial of antibiotic therapy to ensure resolution and exclude underlying malignancy. Electronically Signed   By: Kerby Moors M.D.   On: 02/17/2021 14:29   MR LIVER W WO CONTRAST  Result Date: 02/21/2021 CLINICAL DATA:  Elevated liver function tests. Ultrasound demonstrating a heterogeneous right liver lobe mass. EXAM: MRI ABDOMEN WITHOUT AND WITH CONTRAST TECHNIQUE: Multiplanar multisequence MR imaging of the abdomen was performed both before and after the administration of intravenous contrast. CONTRAST:  77mL GADAVIST GADOBUTROL 1 MMOL/ML IV SOLN COMPARISON:  Abdominal ultrasound of 02/17/2021. FINDINGS: Portions of exam are moderately motion degraded. Lower chest: Tiny bilateral pleural effusions.  Node within the azygoesophageal recess measures 1.9 cm on 01/04. A right lower lobe lung mass measures 4.0 x 3.6 cm on 01/04. Left posterior chest wall mass measures 4.2 cm on 03/04. Mild cardiomegaly with prior median sternotomy. Hepatobiliary: Mass centered in the posterior right hepatic lobe, primarily segments 6 and 7, measures on the order of 9.8 x 7.2 cm on 33/15. Hepatomegaly at 18.6 cm craniocaudal. Normal gallbladder, without biliary ductal dilatation. Pancreas:  Normal, without mass or ductal dilatation. Spleen:  Normal in size, without focal abnormality. Adrenals/Urinary Tract: Left adrenal lesion of 2.2 cm on 41/15. Mild  right adrenal thickening. Left larger than right renal cysts. No enhancing renal mass or hydronephrosis. Stomach/Bowel: Proximal gastric underdistention. Normal abdominal bowel loops. Vascular/Lymphatic: Aortic atherosclerosis. No abdominal adenopathy. Other: No ascites. A right pericolic gutter 8 mm nodule on 74/15 corresponds to restricted diffusion on 67/7, likely indicative of peritoneal metastasis. A subcutaneous left-sided chest wall nodule of 8 mm on 28/15 also demonstrates restricted diffusion on 47/7 and likely represents a metastasis. Musculoskeletal: Extensive osseous metastasis, including within the L3 vertebral body on 25/19 and 10/3. Left-sided L1 and inferior T10 metastasis also identified. IMPRESSION: 1. Widespread metastatic disease, including to the liver, bones, peritoneum, thoracic nodes, and subcutaneous tissues. Favor right lower lobe primary bronchogenic carcinoma. Consider multidisciplinary thoracic oncology consultation. 2. Probable adrenal metastasis. 3. Moderately motion degraded exam. 4. Small bilateral pleural effusions. 5.  Aortic Atherosclerosis (ICD10-I70.0). Electronically Signed   By: Abigail Miyamoto M.D.   On: 02/21/2021 07:04   US Abdomen Limited RUQ (LIVER/GB)  Result Date: 02/17/2021 CLINICAL DATA:  Elevated liver function test. EXAM: ULTRASOUND ABDOMEN LIMITED RIGHT UPPER QUADRANT COMPARISON:  None. FINDINGS: Gallbladder: No gallstones or wall thickening visualized (2.0 mm). No sonographic Murphy sign noted by sonographer. Common bile duct: Diameter: 2.3 mm Liver: An 8.8 cm x 8.4 cm x 8.4 cm heterogeneous liver mass is seen within the right lobe of the liver. There is diffusely increased echogenicity of the liver parenchyma. Portal vein is patent on color Doppler imaging with normal direction of blood flow towards the liver. Other: None. IMPRESSION: 1. Large heterogeneous liver mass, worrisome for the presence of an underlying neoplasm. Further evaluation with hepatic MRI is  recommended. Electronically Signed   By: Virgina Norfolk M.D.   On: 02/17/2021 20:05    Liver metastases [facility]) #80 year old male patient with multiple medical problems including schizophrenia/group home resident-lung fibrosis/CAD/CKD-currently admitted to hospital for left lower lobe pneumonia; on further work-up noted to have multiple liver lesions.  #Right lower lobe lung mass; multiple liver lesions-clinically highly suggestive of lung malignancy with metastasis to liver.  #Left lower lobe pneumonia-on antibiotics  #Multiple comorbidities CAD; CKD; schizophrenia  Recommendations/plan:  #Spoke with patient's legal guardian Mr. Hillis Range significant concerns for malignancy given the findings on imaging.  Discussed that given patient's multiple comorbidities/psychiatric condition patient is not a candidate for any treatment for cancer.  I would not recommend biopsy or further work-up for malignancy as this would not change patient's final management.    #Also discussed DNR DNI  #After lengthy discussion with legal guardian recommends continued current scope of therapy.  We will request palliative care evaluation/ on 8/22.  Thank you Dr.Amery for allowing me to participate in the care of your pleasant patient. Please do not hesitate to contact me with questions or concerns in the interim. Discussed with Dr.Amery.   All questions were answered. The patient knows to call the clinic with any problems, questions or concerns.  Cammie Sickle, MD 02/21/2021 11:40 PM

## 2021-02-21 NOTE — Progress Notes (Signed)
Pt agreed to go to have a MRI and ativan iv was given as ordered.

## 2021-02-22 ENCOUNTER — Inpatient Hospital Stay: Payer: Medicare Other

## 2021-02-22 ENCOUNTER — Inpatient Hospital Stay (HOSPITAL_COMMUNITY)
Admit: 2021-02-22 | Discharge: 2021-02-22 | Disposition: A | Discharge status: Home / Self Care | Payer: Medicare Other | Attending: Internal Medicine | Admitting: Internal Medicine

## 2021-02-22 DIAGNOSIS — I38 Endocarditis, valve unspecified: Secondary | ICD-10-CM | POA: Diagnosis not present

## 2021-02-22 DIAGNOSIS — N1832 Chronic kidney disease, stage 3b: Secondary | ICD-10-CM | POA: Diagnosis not present

## 2021-02-22 DIAGNOSIS — R7989 Other specified abnormal findings of blood chemistry: Secondary | ICD-10-CM | POA: Diagnosis not present

## 2021-02-22 DIAGNOSIS — J189 Pneumonia, unspecified organism: Secondary | ICD-10-CM | POA: Diagnosis not present

## 2021-02-22 DIAGNOSIS — N184 Chronic kidney disease, stage 4 (severe): Secondary | ICD-10-CM | POA: Diagnosis not present

## 2021-02-22 LAB — ECHOCARDIOGRAM COMPLETE
AR max vel: 2.13 cm2
AV Peak grad: 4.8 mmHg
Ao pk vel: 1.1 m/s
Area-P 1/2: 5.75 cm2
Height: 68 in
S' Lateral: 2.73 cm
Weight: 2328.06 oz

## 2021-02-22 LAB — BASIC METABOLIC PANEL
Anion gap: 11 (ref 5–15)
BUN: 37 mg/dL — ABNORMAL HIGH (ref 8–23)
CO2: 22 mmol/L (ref 22–32)
Calcium: 9 mg/dL (ref 8.9–10.3)
Chloride: 103 mmol/L (ref 98–111)
Creatinine, Ser: 1.61 mg/dL — ABNORMAL HIGH (ref 0.61–1.24)
GFR, Estimated: 44 mL/min — ABNORMAL LOW (ref >60)
Glucose, Bld: 105 mg/dL — ABNORMAL HIGH (ref 70–99)
Potassium: 3.9 mmol/L (ref 3.5–5.1)
Sodium: 136 mmol/L (ref 135–145)

## 2021-02-22 LAB — GLUCOSE, CAPILLARY
Glucose-Capillary: 105 mg/dL — ABNORMAL HIGH (ref 70–99)
Glucose-Capillary: 103 mg/dL — ABNORMAL HIGH (ref 70–99)
Glucose-Capillary: 217 mg/dL — ABNORMAL HIGH (ref 70–99)
Glucose-Capillary: 94 mg/dL (ref 70–99)

## 2021-02-22 LAB — CBC
HCT: 28.3 % — ABNORMAL LOW (ref 39.0–52.0)
Hemoglobin: 8.9 g/dL — ABNORMAL LOW (ref 13.0–17.0)
MCH: 25.9 pg — ABNORMAL LOW (ref 26.0–34.0)
MCHC: 31.4 g/dL (ref 30.0–36.0)
MCV: 82.5 fL (ref 80.0–100.0)
Platelets: 354 10*3/uL (ref 150–400)
RBC: 3.43 MIL/uL — ABNORMAL LOW (ref 4.22–5.81)
RDW: 15.8 % — ABNORMAL HIGH (ref 11.5–15.5)
WBC: 16.4 10*3/uL — ABNORMAL HIGH (ref 4.0–10.5)
nRBC: 0 % (ref 0.0–0.2)

## 2021-02-22 LAB — LACTIC ACID, PLASMA: Lactic Acid, Venous: 1.3 mmol/L (ref 0.5–1.9)

## 2021-02-22 LAB — CULTURE, BLOOD (ROUTINE X 2): Culture: NO GROWTH

## 2021-02-22 MED ORDER — SODIUM CHLORIDE 0.9 % IV SOLN: 1.0000 g | Freq: Three times a day (TID) | INTRAVENOUS | Status: DC

## 2021-02-22 MED ORDER — SODIUM CHLORIDE 0.9 % IV SOLN
1.0000 g | Freq: Three times a day (TID) | INTRAVENOUS | Status: DC
  Administered 2021-02-22: 1 g via INTRAVENOUS
  Filled 2021-02-22 (×3): qty 1

## 2021-02-22 MED ORDER — SODIUM CHLORIDE 0.9 % IV BOLUS
250.0000 mL | Freq: Once | INTRAVENOUS | Status: AC
  Administered 2021-02-22: 250 mL via INTRAVENOUS

## 2021-02-22 MED ORDER — VANCOMYCIN HCL 1500 MG/300ML IV SOLN
1500.0000 mg | Freq: Once | INTRAVENOUS | Status: AC
  Administered 2021-02-22: 1500 mg via INTRAVENOUS
  Filled 2021-02-22: qty 300

## 2021-02-22 MED ORDER — SODIUM CHLORIDE 0.9 % IV SOLN
1.0000 g | Freq: Two times a day (BID) | INTRAVENOUS | Status: DC
  Administered 2021-02-22 – 2021-02-24 (×4): 1 g via INTRAVENOUS
  Filled 2021-02-22 (×6): qty 1

## 2021-02-22 MED ORDER — VANCOMYCIN HCL 750 MG/150ML IV SOLN
750.0000 mg | INTRAVENOUS | Status: DC
  Administered 2021-02-23 – 2021-02-24 (×2): 750 mg via INTRAVENOUS
  Filled 2021-02-22 (×3): qty 150

## 2021-02-22 MED ORDER — VANCOMYCIN HCL IN DEXTROSE 1-5 GM/200ML-% IV SOLN: 1000.0000 mg | Freq: Two times a day (BID) | INTRAVENOUS | Status: DC

## 2021-02-22 MED ORDER — SODIUM CHLORIDE 0.9 % IV SOLN: INTRAVENOUS | Status: DC

## 2021-02-22 MED ORDER — MORPHINE SULFATE (PF) 2 MG/ML IV SOLN
1.0000 mg | INTRAVENOUS | Status: DC | PRN
Start: 2021-02-22 — End: 2021-02-24
  Administered 2021-02-22 – 2021-02-24 (×4): 1 mg via INTRAVENOUS
  Filled 2021-02-22 (×4): qty 1

## 2021-02-22 NOTE — Progress Notes (Signed)
PROGRESS NOTE    Robert Little  VQM:[id number] DOB: [birth year] DOA: 02/17/2021 PCP: [name]    Brief Narrative:  Robert Little is a 80 y.o. male with medical history significant for schizophrenia, insulin-dependent diabetes mellitus, anxiety, hyperlipidemia, GERD, hypertension, CAD, CKD 3/CKD 4, chronic anemia, presents to the emergency department from facility for chief concerns of weakness.Patient is irritable and easily annoyed with questions and does not want to fully participate in physical exam  8/17- in foul mood this am. Yells back with any answers.Unable to obtain good history.BG 67 this am 8/18 has no complaints. Less yelling today 8/19 blood culture Aerococcus viridans. 8/20 no overnight issues. MRI completed 8/21 this am pt was hypotensive, fever of 101.1, RR 38, tachycardic.MD was not made aware.   Consultants:  ID  Procedures:  Hepatic MRI  Antimicrobials:   Ceftriaxone,, azith   Subjective: Pt quite, pale, doesn't endorse sob, abd pain, n/v  Objective: Vitals:   02/22/21 0518 02/22/21 0619 02/22/21 0732 02/22/21 0821  BP: (!) 99/53 (!) 154/70 (!) 98/53 108/73  Pulse: (!) 107 (!) 108 (!) 105 (!) 101  Resp: (!) 38 (!) 45 (!) 35 (!) 36  Temp: (!) 101.1 F (38.4 C) 98.1 F (36.7 C) 100 F (37.8 C) 99.3 F (37.4 C)  TempSrc:  Oral    SpO2: 100% 98% 100% 99%  Weight:      Height:        Intake/Output Summary (Last 24 hours) at 02/22/2021 0853 Last data filed at 02/22/2021 9326 Gross per 24 hour  Intake 100 ml  Output 850 ml  Net -750 ml   Filed Weights   02/17/21 1359  Weight: 66 kg    Examination: Patient quiet under covers nontachypneic Dry mucous membrane Anteriorly decreased breath sounds no wheezing Regular S1 and S2 no gallop Soft benign positive bowel sounds No edema Urine affect appropriate in current setting    Data Reviewed: I have personally reviewed following labs and imaging studies  CBC: Recent Labs   Lab 02/17/21 1343 02/18/21 0403 02/19/21 0501 02/20/21 0340 02/22/21 0452  WBC 17.9* 19.1* 17.6* 17.8* 16.4*  HGB 7.4* 6.4* 8.3* 8.9* 8.9*  HCT 24.2* 21.0* 27.0* 28.6* 28.3*  MCV 84.0 84.0 84.9 80.8 82.5  PLT 384 355 314 354 712   Basic Metabolic Panel: Recent Labs  Lab 02/17/21 1343 02/18/21 0403 02/20/21 0340  NA 134* 138  --   K 5.5* 3.9  --   CL 104 110  --   CO2 19* 21*  --   GLUCOSE 152* 62*  --   BUN 61* 53*  --   CREATININE 2.22* 1.88* 1.50*  CALCIUM 9.1 8.6*  --    GFR: Estimated Creatinine Clearance: 37.9 mL/min (A) (by C-G formula based on SCr of 1.5 mg/dL (H)). Liver Function Tests: Recent Labs  Lab 02/17/21 1343 02/18/21 0403  AST 48* 30  ALT 31 27  ALKPHOS 210* 200*  BILITOT 1.6* 0.8  PROT 6.6 5.9*  ALBUMIN 2.1* 2.0*   No results for input(s): LIPASE, AMYLASE in the last 168 hours. No results for input(s): AMMONIA in the last 168 hours. Coagulation Profile: No results for input(s): INR, PROTIME in the last 168 hours. Cardiac Enzymes: No results for input(s): CKTOTAL, CKMB, CKMBINDEX, TROPONINI in the last 168 hours. BNP (last 3 results) No results for input(s): PROBNP in the last 8760 hours. HbA1C: No results for input(s): HGBA1C in the last 72 hours.  CBG: Recent Labs  Lab 02/21/21  3875 02/21/21 1149 02/21/21 1659 02/21/21 2146 02/22/21 0738  GLUCAP 105* 93 97 103* 105*   Lipid Profile: No results for input(s): CHOL, HDL, LDLCALC, TRIG, CHOLHDL, LDLDIRECT in the last 72 hours. Thyroid Function Tests: Recent Labs    02/21/21 0508  TSH 4.936*   Anemia Panel: No results for input(s): VITAMINB12, FOLATE, FERRITIN, TIBC, IRON, RETICCTPCT in the last 72 hours.  Sepsis Labs: No results for input(s): PROCALCITON, LATICACIDVEN in the last 168 hours.  Recent Results (from the past 240 hour(s))  Resp Panel by RT-PCR (Flu A&B, Covid) Nasopharyngeal Swab     Status: None   Collection Time: 02/17/21  2:11 PM   Specimen: Nasopharyngeal  Swab; Nasopharyngeal(NP) swabs in vial transport medium  Result Value Ref Range Status   SARS Coronavirus 2 by RT PCR NEGATIVE NEGATIVE Final    Comment: (NOTE) SARS-CoV-2 target nucleic acids are NOT DETECTED.  The SARS-CoV-2 RNA is generally detectable in upper respiratory specimens during the acute phase of infection. The lowest concentration of SARS-CoV-2 viral copies this assay can detect is 138 copies/mL. A negative result does not preclude SARS-Cov-2 infection and should not be used as the sole basis for treatment or other patient management decisions. A negative result may occur with  improper specimen collection/handling, submission of specimen other than nasopharyngeal swab, presence of viral mutation(s) within the areas targeted by this assay, and inadequate number of viral copies(<138 copies/mL). A negative result must be combined with clinical observations, patient history, and epidemiological information. The expected result is Negative.  Fact Sheet for Patients:  EntrepreneurPulse.com.au  Fact Sheet for Healthcare Providers:  IncredibleEmployment.be  This test is no t yet approved or cleared by the [place] FDA and  has been authorized for detection and/or diagnosis of SARS-CoV-2 by FDA under an Emergency Use Authorization (EUA). This EUA will remain  in effect (meaning this test can be used) for the duration of the COVID-19 declaration under Section 564(b)(1) of the Act, 21 U.S.C.section 360bbb-3(b)(1), unless the authorization is terminated  or revoked sooner.       Influenza A by PCR NEGATIVE NEGATIVE Final   Influenza B by PCR NEGATIVE NEGATIVE Final    Comment: (NOTE) The Xpert Xpress SARS-CoV-2/FLU/RSV plus assay is intended as an aid in the diagnosis of influenza from Nasopharyngeal swab specimens and should not be used as a sole basis for treatment. Nasal washings and aspirates are unacceptable for Xpert Xpress  SARS-CoV-2/FLU/RSV testing.  Fact Sheet for Patients: EntrepreneurPulse.com.au  Fact Sheet for Healthcare Providers: IncredibleEmployment.be  This test is not yet approved or cleared by the [place] FDA and has been authorized for detection and/or diagnosis of SARS-CoV-2 by FDA under an Emergency Use Authorization (EUA). This EUA will remain in effect (meaning this test can be used) for the duration of the COVID-19 declaration under Section 564(b)(1) of the Act, 21 U.S.C. section 360bbb-3(b)(1), unless the authorization is terminated or revoked.  Performed at [facility], [address] [place], [place] [address]   Blood culture (routine x 2)     Status: Abnormal   Collection Time: 02/17/21  3:19 PM   Specimen: BLOOD  Result Value Ref Range Status   Specimen Description   Final    BLOOD RIGHT ANTECUBITAL Performed at [facility], [address] [place], [place] [address]    Special Requests   Final    BOTTLES DRAWN AEROBIC AND ANAEROBIC Blood Culture results may not be optimal due to an inadequate volume of blood  received in culture bottles Performed at [facility], [address] [place], [place] [address]    Culture  Setup Time   Final    Organism ID to follow [place] CRITICAL RESULT CALLED TO, READ BACK BY AND VERIFIED WITH: [facility] MADE 02/18/21 [place] AEROBIC BOTTLE ONLY CRITICAL RESULT CALLED TO, READ BACK BY AND VERIFIED WITH: BRANDON BEERS 02/21/2021 [facility]    Culture AEROCOCCUS VIRIDANS GRAM POSITIVE RODS  (A)  Final   Report Status 02/20/2021 FINAL  Final  Blood Culture ID Panel (Reflexed)     Status: None   Collection Time: 02/17/21  3:19 PM  Result Value Ref Range Status   Enterococcus faecalis NOT DETECTED NOT DETECTED Final   Enterococcus Faecium NOT DETECTED NOT DETECTED Final   Listeria monocytogenes NOT DETECTED NOT DETECTED Final    Staphylococcus species NOT DETECTED NOT DETECTED Final   Staphylococcus aureus (BCID) NOT DETECTED NOT DETECTED Final   Staphylococcus epidermidis NOT DETECTED NOT DETECTED Final   Staphylococcus lugdunensis NOT DETECTED NOT DETECTED Final   Streptococcus species NOT DETECTED NOT DETECTED Final   Streptococcus agalactiae NOT DETECTED NOT DETECTED Final   Streptococcus pneumoniae NOT DETECTED NOT DETECTED Final   Streptococcus pyogenes NOT DETECTED NOT DETECTED Final   A.calcoaceticus-baumannii NOT DETECTED NOT DETECTED Final   Bacteroides fragilis NOT DETECTED NOT DETECTED Final   Enterobacterales NOT DETECTED NOT DETECTED Final   Enterobacter cloacae complex NOT DETECTED NOT DETECTED Final   Escherichia coli NOT DETECTED NOT DETECTED Final   Klebsiella aerogenes NOT DETECTED NOT DETECTED Final   Klebsiella oxytoca NOT DETECTED NOT DETECTED Final   Klebsiella pneumoniae NOT DETECTED NOT DETECTED Final   Proteus species NOT DETECTED NOT DETECTED Final   Salmonella species NOT DETECTED NOT DETECTED Final   Serratia marcescens NOT DETECTED NOT DETECTED Final   Haemophilus influenzae NOT DETECTED NOT DETECTED Final   Neisseria meningitidis NOT DETECTED NOT DETECTED Final   Pseudomonas aeruginosa NOT DETECTED NOT DETECTED Final   Stenotrophomonas maltophilia NOT DETECTED NOT DETECTED Final   Candida albicans NOT DETECTED NOT DETECTED Final   Candida auris NOT DETECTED NOT DETECTED Final   Candida glabrata NOT DETECTED NOT DETECTED Final   Candida krusei NOT DETECTED NOT DETECTED Final   Candida parapsilosis NOT DETECTED NOT DETECTED Final   Candida tropicalis NOT DETECTED NOT DETECTED Final   Cryptococcus neoformans/gattii NOT DETECTED NOT DETECTED Final    Comment: Performed at [facility], [address] [place], [place] [address]  Blood culture (routine x 2)     Status: None   Collection Time: 02/17/21  3:25 PM   Specimen: BLOOD  Result Value Ref Range Status    Specimen Description BLOOD BLOOD LEFT HAND  Final   Special Requests   Final    BOTTLES DRAWN AEROBIC ONLY Blood Culture results may not be optimal due to an inadequate volume of blood received in culture bottles   Culture   Final    NO GROWTH 5 DAYS Performed at [facility], [address], [place], [place] [address]    Report Status 02/22/2021 FINAL  Final         Radiology Studies: MR LIVER W WO CONTRAST  Result Date: 02/21/2021 CLINICAL DATA:  Elevated liver function tests. Ultrasound demonstrating a heterogeneous right liver lobe mass. EXAM: MRI ABDOMEN WITHOUT AND WITH CONTRAST TECHNIQUE: Multiplanar multisequence MR imaging of the abdomen was performed both before and after the administration of  intravenous contrast. CONTRAST:  106mL GADAVIST GADOBUTROL 1 MMOL/ML IV SOLN COMPARISON:  Abdominal ultrasound of 02/17/2021. FINDINGS: Portions of exam are moderately motion degraded. Lower chest: Tiny bilateral pleural effusions. Node within the azygoesophageal recess measures 1.9 cm on 01/04. A right lower lobe lung mass measures 4.0 x 3.6 cm on 01/04. Left posterior chest wall mass measures 4.2 cm on 03/04. Mild cardiomegaly with prior median sternotomy. Hepatobiliary: Mass centered in the posterior right hepatic lobe, primarily segments 6 and 7, measures on the order of 9.8 x 7.2 cm on 33/15. Hepatomegaly at 18.6 cm craniocaudal. Normal gallbladder, without biliary ductal dilatation. Pancreas:  Normal, without mass or ductal dilatation. Spleen:  Normal in size, without focal abnormality. Adrenals/Urinary Tract: Left adrenal lesion of 2.2 cm on 41/15. Mild right adrenal thickening. Left larger than right renal cysts. No enhancing renal mass or hydronephrosis. Stomach/Bowel: Proximal gastric underdistention. Normal abdominal bowel loops. Vascular/Lymphatic: Aortic atherosclerosis. No abdominal adenopathy. Other: No ascites. A right pericolic gutter 8 mm nodule on 74/15 corresponds to  restricted diffusion on 67/7, likely indicative of peritoneal metastasis. A subcutaneous left-sided chest wall nodule of 8 mm on 28/15 also demonstrates restricted diffusion on 47/7 and likely represents a metastasis. Musculoskeletal: Extensive osseous metastasis, including within the L3 vertebral body on 25/19 and 10/3. Left-sided L1 and inferior T10 metastasis also identified. IMPRESSION: 1. Widespread metastatic disease, including to the liver, bones, peritoneum, thoracic nodes, and subcutaneous tissues. Favor right lower lobe primary bronchogenic carcinoma. Consider multidisciplinary thoracic oncology consultation. 2. Probable adrenal metastasis. 3. Moderately motion degraded exam. 4. Small bilateral pleural effusions. 5.  Aortic Atherosclerosis (ICD10-I70.0). Electronically Signed   By: Abigail Miyamoto M.D.   On: 02/21/2021 07:04        Scheduled Meds:  atorvastatin  20 mg Oral QHS   dextromethorphan-guaiFENesin  1 tablet Oral BID   enoxaparin (LOVENOX) injection  40 mg Subcutaneous Q24H   feeding supplement  237 mL Oral TID BM   ferrous sulfate  325 mg Oral BID WC   fluticasone furoate-vilanterol  1 puff Inhalation Daily   insulin aspart  0-5 Units Subcutaneous QHS   insulin aspart  0-9 Units Subcutaneous TID WC   insulin glargine-yfgn  5 Units Subcutaneous QHS   multivitamin with minerals  1 tablet Oral Daily   pantoprazole  40 mg Oral Daily   QUEtiapine  25 mg Oral Daily   tamsulosin  0.4 mg Oral QPC supper   umeclidinium bromide  1 puff Inhalation Daily   vitamin B-12  500 mcg Oral BID   Continuous Infusions:  azithromycin 500 mg (02/21/21 0900)   meropenem (MERREM) IV     vancomycin      Assessment & Plan:   Principal Problem:   Left lower lobe pneumonia Active Problems:   Essential hypertension   Schizo affective schizophrenia ([place])   HLD (hyperlipidemia)   CKD (chronic kidney disease), stage IV (HCC)   Malnutrition of moderate degree   Elevated troponin   Paroxysmal  atrial fibrillation (HCC)   Neutrophilic leukocytosis   Anemia due to chronic kidney disease   Anemia due to stage 4 chronic kidney disease (HCC)   Elevated LFTs   Protein-calorie malnutrition, severe   Liver metastases ([place])   # Left lower lobe pneumonia per chest x-ray read - Patient did endorse cough with me, unclear if it is productive - COVID/influenza A/influenza B PCR were negative 8/19 continue ceftriaxone and azithromycin  PT consulted but difficult to perform as patient has agitated behavior and  yelling at the staff  +bcx with aer. Viridans. ID consulted.  8/20 IDs input was appreciated.  1 out of 4 anaerobic bottle could be true pathogen versus contaminant.  Due to the liver mass this possibly could be a true pathogen.  Ceftriaxone will cover this and will continue. Occasional known to cause endocarditis. 8/21-ck echo and change abx to meropenem/vanco. Repeat cxr    #SIRS/sepsis- based on fever, tachycardia, tachypnia. +bcx Give fluid bolus and start ivf for hydration Change abx to meropenem and vancomycin Ck cxr Obtain UA, ucx, and blood cx     # Acute on chronic anemia - Hemoglobin ranged of 7.7-8.6 in 2021 - CBC in the a.m. - Patient denies nausea vomiting, black stool, bleeding and is otherwise hemodynamically stable 8/17- continue iron pills Ck stool occult Hg 6.4, spoke to legal guardian [name] and discussed transfusion and possible complications and he is agreeable. Told me pt was transfused last year without any issues. Will transfuse 1prbc, ck post h/h, if <7 will give a second unit DC heparin subcu and aspirin until GI bleed ruled out 8/20 h/h remained stable Stool occult negative    #Rt LL lung mass- found on MRI liver.   # Elevated liver enzymes Improved Ruq JG:GEZMO heterogeneous liver mass, worrisome for the presence of an underlying neoplasm. Further evaluation with hepatic MRI is recommended. 8/20-MRI with hepatic revealed  widespread mets disease including to liver, bones, peritoneum, thoracic nodes, subcutaneous tissue.  Stable right lower lobe primary bronchogenic carcinoma per radiology  8/21 oncology's input was appreciated.  Oncology spoke to legal guardian and given patient's multiple comorbidities, psychiatric condition patient is not a candidate for any treatment for cancer.  Oncology recommends not doing biopsy or further work-up for At this is would not change patient's final management. Will request palliative care evaluation in a.m.   # History of chronic leukocytosis - WBC has been chronically elevated in 2021 - This is the highest level its been 8/19- possibly due to infection  Continue to monitor          # Severe protein/calorie malnutrition-continue ensure   # Elevated troponin with downtime trending delta troponin - Patient denies chest pain and shortness of breath at this time - Low clinical suspicion for ACS Likely due to demand ischemia   # Insulin-dependent diabetes mellitus-I suspect this is type II - Resumed home long-acting 10 units nightly - Insulin SSI with at bedtime coverage ordered A1c 6.9  # Query dementia # Schizophrenia with schizoaffective disorder - Resumed home quetiapine 25 mg p.o. daily   # Anxiety-resume Klonopin 0.25 mg p.o. twice daily as needed for anxiety      DVT prophylaxis: SCD. lovenox Code Status: DNR Family Communication: Communicated with the legal guardian. Agree with palliative care. Updated this am event disposition Plan:  Status is: Inpatient   The patient remains inpatient due to severity of illness and requiring IV treatment  Dispo: The patient is from: Group home              Anticipated d/c is to: Group home              Patient currently is not medically stable to d/c.   Difficult to place patient No  Needs IV antibiotics.  Oncology consulted.         LOS: 4 days   Time spent: 35 minutes with more than 50% on  [place], MD [facility] Pager [phone]  If 7PM-7AM, please contact night-coverage 02/22/2021, 8:53 AM

## 2021-02-22 NOTE — Progress Notes (Signed)
   02/22/21 0518  Assess: MEWS Score  Temp (!) 101.1 F (38.4 C)  BP (!) 99/53  Pulse Rate (!) 107  Resp (!) 38  SpO2 100 %  O2 Device Aerosol Mask  Patient Activity (if Appropriate) In bed  Assess: MEWS Score  MEWS Temp 1  MEWS Systolic 1  MEWS Pulse 1  MEWS RR 3  MEWS LOC 0  MEWS Score 6  MEWS Score Color Red  Assess: if the MEWS score is Yellow or Red  Were vital signs taken at a resting state? Yes  Focused Assessment No change from prior assessment  Does the patient meet 2 or more of the SIRS criteria? No  MEWS guidelines implemented *See Row Information* Yes  Treat  MEWS Interventions Escalated (See documentation below)  Pain Scale 0-10  Pain Score 0  Breathing 1  Negative Vocalization 1  Take Vital Signs  Increase Vital Sign Frequency  Red: Q 1hr X 4 then Q 4hr X 4, if remains red, continue Q 4hrs  Escalate  MEWS: Escalate Red: discuss with charge nurse/RN and provider, consider discussing with RRT  Notify: Charge Nurse/RN  Name of Charge Nurse/RN Notified Nickola Major, RN  Date Charge Nurse/RN Notified 02/22/21  Time Charge Nurse/RN Notified [id number]  Notify: Provider  Provider Name/Title Judd Gaudier, MD  Date Provider Notified 02/22/21  Time Provider Notified [phone]  Notification Type Page  Notification Reason Change in status  Provider response No new orders (Returned call)  Date of Provider Response 02/22/21  Time of Provider Response 0518  Notify: Rapid Response  Name of Rapid Response RN Notified Danae Chen, RN  Date Rapid Response Notified 02/22/21  Time Rapid Response Notified 0450  Document  Patient Outcome Other (Comment)  Progress note created (see row info) Yes  Assess: SIRS CRITERIA  SIRS Temperature  1  SIRS Pulse 1  SIRS Respirations  1  SIRS WBC 0  SIRS Score Sum  3  NNO per H. Damita Dunnings, MD. Palliative consult order in place, will continue to monitor.

## 2021-02-22 NOTE — Progress Notes (Signed)
   02/22/21 0732  Assess: MEWS Score  Temp 100 F (37.8 C)  BP (!) 98/53  Pulse Rate (!) 105  Resp (!) 35  SpO2 100 %  O2 Device Nasal Cannula  Assess: MEWS Score  MEWS Temp 0  MEWS Systolic 1  MEWS Pulse 1  MEWS RR 2  MEWS LOC 0  MEWS Score 4  MEWS Score Color Red  Assess: if the MEWS score is Yellow or Red  Were vital signs taken at a resting state? Yes  Focused Assessment No change from prior assessment  Does the patient meet 2 or more of the SIRS criteria? No  Does the patient have a confirmed or suspected source of infection? No  MEWS guidelines implemented *See Row Information* No, previously red, continue vital signs every 4 hours  Document  Patient Outcome Other (Comment) (patient stable, no changes from previous assessment)  Progress note created (see row info) Yes  Assess: SIRS CRITERIA  SIRS Temperature  0  SIRS Pulse 1  SIRS Respirations  1  SIRS WBC 1  SIRS Score Sum  3

## 2021-02-22 NOTE — Progress Notes (Signed)
*  PRELIMINARY RESULTS* Echocardiogram 2D Echocardiogram has been performed.  Claretta Fraise 02/22/2021, 11:03 AM

## 2021-02-22 NOTE — Progress Notes (Signed)
   02/22/21 0518 02/22/21 0619 02/22/21 0732  Assess: MEWS Score  Temp (!) 101.1 F (38.4 C) 98.1 F (36.7 C) 100 F (37.8 C)  BP (!) 99/53 (!) 154/70 (!) 98/53  Pulse Rate (!) 107 (!) 108 (!) 105  Resp (!) 38 (!) 45 (!) 35  SpO2 100 % 98 % 100 %  O2 Device Aerosol Mask Nasal Cannula Nasal Cannula  Patient Activity (if Appropriate) In bed  --   --

## 2021-02-22 NOTE — Progress Notes (Signed)
   02/22/21 0515  Clinical Encounter Type  Visited With Patient  Visit Type Initial;Other (Comment) (rapid response)  Spiritual Encounters  Spiritual Needs Other (Comment) (none at this time)  Chaplain Burris responded to a rapid response for Mr. Hosie. I spoke with him briefly as care team completed their intervention. Pt was somewhat agitated and stated "leave me alone." Chaplain adjusted his lighting to be more restful and simply let him know support was available.

## 2021-02-22 NOTE — Progress Notes (Signed)
Pharmacy Antibiotic Note  Robert Little is a 80 y.o. male admitted on 02/17/2021. Pharmacy has been consulted for vancomycin dosing.  Plan: Vancomycin 1500 mg IV loading dose followed by vancomycin 750 mg IV q24h Goal AUC 400-550 Expected AUC: 468 SCr used: 1.61  Meropenem 1 g IV q12h   Height: 5\' 8"  (172.7 cm) Weight: 66 kg (145 lb 8.1 oz) IBW/kg (Calculated) : 68.4  Temp (24hrs), Avg:98.9 F (37.2 C), Min:97.6 F (36.4 C), Max:101.1 F (38.4 C)  Recent Labs  Lab 02/17/21 1343 02/18/21 0403 02/19/21 0501 02/20/21 0340 02/22/21 0452 02/22/21 0858  WBC 17.9* 19.1* 17.6* 17.8* 16.4*  --   CREATININE 2.22* 1.88*  --  1.50*  --  1.61*  LATICACIDVEN  --   --   --   --   --  1.3    Estimated Creatinine Clearance: 35.3 mL/min (A) (by C-G formula based on SCr of 1.61 mg/dL (H)).    No Known Allergies  Antimicrobials this admission: Ceftriaxone 8/16 >> 8/21 Azithromycin 8/16 >> Vancomycin 8/21 >> Meropenem 8/21 >>   Microbiology results: 8/21 BCx: pending 8/16 BCx: 1/4 aerococcus viridans, GPR pending   Thank you for allowing pharmacy to be a part of this patient's care.  Tawnya Crook, PharmD, BCPS Clinical Pharmacist 02/22/2021 11:03 AM

## 2021-02-22 NOTE — Progress Notes (Signed)
   02/21/21 1939 02/21/21 2036  Assess: MEWS Score  Temp 98.6 F (37 C) 98.4 F (36.9 C)  BP 131/62 (!) 136/59  Pulse Rate 84 94  Resp (!) 26 (!) 24  Level of Consciousness  --  Alert  SpO2 100 % 100 %  O2 Device Nasal Cannula Nasal Cannula  Patient Activity (if Appropriate)  --  In bed  O2 Flow Rate (L/min)  --  2 L/min

## 2021-02-23 DIAGNOSIS — Z515 Encounter for palliative care: Secondary | ICD-10-CM | POA: Diagnosis not present

## 2021-02-23 DIAGNOSIS — R7989 Other specified abnormal findings of blood chemistry: Secondary | ICD-10-CM | POA: Diagnosis not present

## 2021-02-23 DIAGNOSIS — J189 Pneumonia, unspecified organism: Principal | ICD-10-CM

## 2021-02-23 DIAGNOSIS — N184 Chronic kidney disease, stage 4 (severe): Secondary | ICD-10-CM | POA: Diagnosis not present

## 2021-02-23 DIAGNOSIS — N1832 Chronic kidney disease, stage 3b: Secondary | ICD-10-CM | POA: Diagnosis not present

## 2021-02-23 LAB — GLUCOSE, CAPILLARY
Glucose-Capillary: 82 mg/dL (ref 70–99)
Glucose-Capillary: 176 mg/dL — ABNORMAL HIGH (ref 70–99)
Glucose-Capillary: 168 mg/dL — ABNORMAL HIGH (ref 70–99)
Glucose-Capillary: 160 mg/dL — ABNORMAL HIGH (ref 70–99)

## 2021-02-23 LAB — CULTURE, BLOOD (ROUTINE X 2)

## 2021-02-23 LAB — CREATININE, SERUM
Creatinine, Ser: 1.54 mg/dL — ABNORMAL HIGH (ref 0.61–1.24)
GFR, Estimated: 46 mL/min — ABNORMAL LOW (ref >60)

## 2021-02-23 LAB — MRSA NEXT GEN BY PCR, NASAL: MRSA by PCR Next Gen: DETECTED — AB

## 2021-02-23 MED ORDER — OXYCODONE-ACETAMINOPHEN 5-325 MG PO TABS: 1.0000 | ORAL_TABLET | ORAL | Status: DC | PRN

## 2021-02-23 NOTE — Consult Note (Signed)
Robert [phone] [phone]   Name: Robert Little MRN: [id number]  DOB: 1941-04-02  Patient Care Team: Care, [name] [facility] as PCP - General (Family Medicine) Croitoru, Dani Gobble, MD as PCP - Cardiology (Cardiology) Barrett, Evelene Croon, PA-C as Physician Assistant (Cardiology)    REASON FOR CONSULTATION: Robert Little is a 80 y.o. male with multiple medical problems including schizophrenia, diabetes, anxiety, hyperlipidemia, GERD, hypertension, CAD, CKD, and anemia, who was admitted to hospital 02/17/2021 with pneumonia.  Patient was incidentally found to have widely metastatic cancer (presumed lung origin) involving the liver, bone, peritoneum, thoracic nodes, and subcutaneous tissues.  Patient has been seen in consultation with medical oncology and has not felt to be a viable candidate for systemic treatment.  Palliative care was consulted to address goals.  SOCIAL HISTORY:     reports that he has quit smoking. His smoking use included cigarettes. He has never used smokeless tobacco. He reports that he does not currently use alcohol. He reports that he does not currently use drugs.  Patient is a resident at [facility] for the past 8-12 months.  He has a legal guardian, Robert Little.  Patient has a daughter who lives in [place].  ADVANCE DIRECTIVES:  Not on file  CODE STATUS: DNR  PAST MEDICAL HISTORY: Past Medical History:  Diagnosis Date   Anemia    Chronic diastolic CHF (congestive heart failure) (HCC)    Chronic respiratory failure (HCC)    CKD (chronic kidney disease), stage IV ([place])    Coronary artery disease    CABG 2005 at [facility]   COVID-19 11/2019   pneumonia   Diabetes mellitus ([place])    Dysphagia    GERD (gastroesophageal reflux disease)    Hypertension    Lung nodule    Pulmonary fibrosis (HCC)    Schizo affective schizophrenia ([place])    Tobacco abuse     PAST SURGICAL  HISTORY:  Past Surgical History:  Procedure Laterality Date   CORONARY ARTERY BYPASS GRAFT      HEMATOLOGY/ONCOLOGY HISTORY:  Oncology History   No history exists.    ALLERGIES:  has No Known Allergies.  MEDICATIONS:  Current Facility-Administered Medications  Medication Dose Route Frequency Provider Last Rate Last Admin   0.9 %  sodium chloride infusion   Intravenous Continuous Nolberto Hanlon, MD 75 mL/hr at 02/23/21 0502 New Bag at 02/23/21 0502   atorvastatin (LIPITOR) tablet 20 mg  20 mg Oral QHS Cox, Amy N, DO   20 mg at 02/22/21 2208   clonazePAM (KLONOPIN) disintegrating tablet 0.25 mg  0.25 mg Oral BID PRN Cox, Amy N, DO   0.25 mg at 02/17/21 2344   dextromethorphan-guaiFENesin (MUCINEX DM) 30-600 MG per 12 hr tablet 1 tablet  1 tablet Oral BID Cox, Amy N, DO   1 tablet at 02/23/21 0851   enoxaparin (LOVENOX) injection 40 mg  40 mg Subcutaneous Q24H Nolberto Hanlon, MD   40 mg at 02/22/21 2212   feeding supplement (ENSURE ENLIVE / ENSURE PLUS) liquid 237 mL  237 mL Oral TID BM Nolberto Hanlon, MD   237 mL at 02/23/21 0851   ferrous sulfate tablet 325 mg  325 mg Oral BID WC Cox, Amy N, DO   325 mg at 02/23/21 0852   fluticasone furoate-vilanterol (BREO ELLIPTA) 200-25 MCG/INH 1 puff  1 puff Inhalation Daily Cox, Amy N, DO   1 puff at 02/23/21 0854   insulin aspart (novoLOG)  injection 0-5 Units  0-5 Units Subcutaneous QHS Cox, Amy N, DO       insulin aspart (novoLOG) injection 0-9 Units  0-9 Units Subcutaneous TID WC Cox, Amy N, DO   2 Units at 02/23/21 1240   insulin glargine-yfgn (SEMGLEE) injection 5 Units  5 Units Subcutaneous QHS Nolberto Hanlon, MD   5 Units at 02/22/21 2205   ipratropium-albuterol (DUONEB) 0.5-2.5 (3) MG/3ML nebulizer solution 3 mL  3 mL Nebulization Q4H PRN Nolberto Hanlon, MD   3 mL at 02/22/21 0512   meropenem (MERREM) 1 g in sodium chloride 0.9 % 100 mL IVPB  1 g Intravenous Q12H Nolberto Hanlon, MD 200 mL/hr at 02/23/21 0855 1 g at 02/23/21 0855   morphine 2 MG/ML  injection 1 mg  1 mg Intravenous Q4H PRN Nolberto Hanlon, MD   1 mg at 02/22/21 2218   multivitamin with minerals tablet 1 tablet  1 tablet Oral Daily Cox, Amy N, DO   1 tablet at 02/23/21 0852   ondansetron (ZOFRAN) tablet 4 mg  4 mg Oral Q6H PRN Cox, Amy N, DO       Or   ondansetron (ZOFRAN) injection 4 mg  4 mg Intravenous Q6H PRN Cox, Amy N, DO       pantoprazole (PROTONIX) EC tablet 40 mg  40 mg Oral Daily Cox, Amy N, DO   40 mg at 02/23/21 0938   polyethylene glycol (MIRALAX / GLYCOLAX) packet 17 g  17 g Oral BID PRN Rito Ehrlich A, RPH       QUEtiapine (SEROQUEL) tablet 25 mg  25 mg Oral Daily Cox, Amy N, DO   25 mg at 02/23/21 1829   tamsulosin (FLOMAX) capsule 0.4 mg  0.4 mg Oral QPC supper Cox, Amy N, DO   0.4 mg at 02/23/21 9371   umeclidinium bromide (INCRUSE ELLIPTA) 62.5 MCG/INH 1 puff  1 puff Inhalation Daily Cox, Amy N, DO   1 puff at 02/23/21 0854   vancomycin (VANCOREADY) IVPB 750 mg/150 mL  750 mg Intravenous Q24H Nolberto Hanlon, MD 150 mL/hr at 02/23/21 1103 750 mg at 02/23/21 1103   vitamin B-12 (CYANOCOBALAMIN) tablet 500 mcg  500 mcg Oral BID Cox, Amy N, DO   500 mcg at 02/23/21 6967    VITAL SIGNS: BP (!) 119/58 (BP Location: Right Arm)   Pulse 88   Temp 98.3 F (36.8 C)   Resp (!) 32   Ht 5\' 8"  (1.727 m)   Wt 145 lb 8.1 oz (66 kg)   SpO2 97%   BMI 22.12 kg/m  Filed Weights   02/17/21 1359  Weight: 145 lb 8.1 oz (66 kg)    Estimated body mass index is 22.12 kg/m as calculated from the following:   Height as of this encounter: 5\' 8"  (1.727 m).   Weight as of this encounter: 145 lb 8.1 oz (66 kg).  LABS: CBC:    Component Value Date/Time   WBC 16.4 (H) 02/22/2021 0452   HGB 8.9 (L) 02/22/2021 0452   HCT 28.3 (L) 02/22/2021 0452   HCT 26.2 (L) 01/29/2020 0158   PLT 354 02/22/2021 0452   MCV 82.5 02/22/2021 0452   NEUTROABS 5.1 01/24/2020 0619   LYMPHSABS 2.2 01/24/2020 0619   MONOABS 0.8 01/24/2020 0619   EOSABS 0.1 01/24/2020 0619   BASOSABS 0.0  01/24/2020 0619   Comprehensive Metabolic Panel:    Component Value Date/Time   NA 136 02/22/2021 0858   NA 138 01/09/2020 1023  K 3.9 02/22/2021 0858   CL 103 02/22/2021 0858   CO2 22 02/22/2021 0858   BUN 37 (H) 02/22/2021 0858   BUN 19 01/09/2020 1023   CREATININE 1.54 (H) 02/23/2021 0558   GLUCOSE 105 (H) 02/22/2021 0858   CALCIUM 9.0 02/22/2021 0858   CALCIUM 5.1 09/05/2019 2156   AST 30 02/18/2021 0403   ALT 27 02/18/2021 0403   ALKPHOS 200 (H) 02/18/2021 0403   BILITOT 0.8 02/18/2021 0403   PROT 5.9 (L) 02/18/2021 0403   ALBUMIN 2.0 (L) 02/18/2021 0403    RADIOGRAPHIC STUDIES: DG Chest 2 View  Result Date: 02/17/2021 CLINICAL DATA:  Weakness. EXAM: CHEST - 2 VIEW COMPARISON:  01/30/2020 FINDINGS: Previous median sternotomy and CABG procedure. Stable cardiomediastinal contours. Lung volumes are low. There is extensive bilateral lower lung zone predominant diffuse interstitial reticular opacities compatible with fibrotic lung disease. Superimposed airspace opacity is identified within the left lower lobe posteriorly. No pleural effusion or edema. Degenerative disc disease noted within the thoracic spine. IMPRESSION: 1. Chronic interstitial lung disease. 2. Superimposed airspace opacity within the left lower lobe compatible with pneumonia. Followup PA and lateral chest X-ray is recommended in 3-4 weeks following trial of antibiotic therapy to ensure resolution and exclude underlying malignancy. Electronically Signed   By: Kerby Moors M.D.   On: 02/17/2021 14:29   MR LIVER W WO CONTRAST  Result Date: 02/21/2021 CLINICAL DATA:  Elevated liver function tests. Ultrasound demonstrating a heterogeneous right liver lobe mass. EXAM: MRI ABDOMEN WITHOUT AND WITH CONTRAST TECHNIQUE: Multiplanar multisequence MR imaging of the abdomen was performed both before and after the administration of intravenous contrast. CONTRAST:  29mL GADAVIST GADOBUTROL 1 MMOL/ML IV SOLN COMPARISON:  Abdominal  ultrasound of 02/17/2021. FINDINGS: Portions of exam are moderately motion degraded. Lower chest: Tiny bilateral pleural effusions. Node within the azygoesophageal recess measures 1.9 cm on 01/04. A right lower lobe lung mass measures 4.0 x 3.6 cm on 01/04. Left posterior chest wall mass measures 4.2 cm on 03/04. Mild cardiomegaly with prior median sternotomy. Hepatobiliary: Mass centered in the posterior right hepatic lobe, primarily segments 6 and 7, measures on the order of 9.8 x 7.2 cm on 33/15. Hepatomegaly at 18.6 cm craniocaudal. Normal gallbladder, without biliary ductal dilatation. Pancreas:  Normal, without mass or ductal dilatation. Spleen:  Normal in size, without focal abnormality. Adrenals/Urinary Tract: Left adrenal lesion of 2.2 cm on 41/15. Mild right adrenal thickening. Left larger than right renal cysts. No enhancing renal mass or hydronephrosis. Stomach/Bowel: Proximal gastric underdistention. Normal abdominal bowel loops. Vascular/Lymphatic: Aortic atherosclerosis. No abdominal adenopathy. Other: No ascites. A right pericolic gutter 8 mm nodule on 74/15 corresponds to restricted diffusion on 67/7, likely indicative of peritoneal metastasis. A subcutaneous left-sided chest wall nodule of 8 mm on 28/15 also demonstrates restricted diffusion on 47/7 and likely represents a metastasis. Musculoskeletal: Extensive osseous metastasis, including within the L3 vertebral body on 25/19 and 10/3. Left-sided L1 and inferior T10 metastasis also identified. IMPRESSION: 1. Widespread metastatic disease, including to the liver, bones, peritoneum, thoracic nodes, and subcutaneous tissues. Favor right lower lobe primary bronchogenic carcinoma. Consider multidisciplinary thoracic oncology consultation. 2. Probable adrenal metastasis. 3. Moderately motion degraded exam. 4. Small bilateral pleural effusions. 5.  Aortic Atherosclerosis (ICD10-I70.0). Electronically Signed   By: Abigail Miyamoto M.D.   On: 02/21/2021 07:04    DG Chest Port 1 View  Result Date: 02/22/2021 CLINICAL DATA:  Altered mental status. Weakness. Hypertension diabetes EXAM: PORTABLE CHEST 1 VIEW COMPARISON:  02/17/2021 FINDINGS:  Midline trachea. Normal heart size. Atherosclerosis in the transverse aorta. Prior median sternotomy. No pleural effusion or pneumothorax. Lower lung predominant interstitial thickening is consistent with usual interstitial pneumonitis on prior CT. Right hilar soft tissue fullness is similar to on the prior exam, but not readily apparent on more remote studies. IMPRESSION: Interstitial lung disease, as before. Right hilar soft tissue fullness. This likely represents mass/adenopathy when correlated with yesterday's MRI. Electronically Signed   By: Abigail Miyamoto M.D.   On: 02/22/2021 09:16   ECHOCARDIOGRAM COMPLETE  Result Date: 02/22/2021    ECHOCARDIOGRAM REPORT   Patient Name:   Robert Little Date of Exam: 02/22/2021 Medical Rec #:  [id number]    Height:       68.0 in Accession #:    [id number]   Weight:       145.5 lb Date of Birth:  11/07/1940    BSA:          1.785 m Patient Age:    42 years     BP:           98/53 mmHg Patient Gender: M            HR:           98 bpm. Exam Location:  [facility] Procedure: 2D Echo Indications:     Endocarditis I38  History:         Patient has prior history of Echocardiogram examinations, most                  recent 01/24/2020.  Sonographer:     Kathlen Brunswick RDCS Referring Phys:  [id number] [place] Diagnosing Phys: Ida Rogue MD  Sonographer Comments: No subcostal window. Image acquisition challenging due to respiratory motion. IMPRESSIONS  1. No valve vegetation noted  2. Left ventricular ejection fraction, by estimation, is 60 to 65%. The left ventricle has normal function. The left ventricle has no regional wall motion abnormalities. Left ventricular diastolic parameters are consistent with Grade I diastolic dysfunction (impaired relaxation).  3. Right ventricular systolic function is  normal. The right ventricular size is normal. There is mildly elevated pulmonary artery systolic pressure.  4. Left atrial size was mildly dilated.  5. The mitral valve is normal in structure. Mild mitral valve regurgitation. FINDINGS  Left Ventricle: Left ventricular ejection fraction, by estimation, is 60 to 65%. The left ventricle has normal function. The left ventricle has no regional wall motion abnormalities. The left ventricular internal cavity size was normal in size. There is  no left ventricular hypertrophy. Left ventricular diastolic parameters are consistent with Grade I diastolic dysfunction (impaired relaxation). Right Ventricle: The right ventricular size is normal. No increase in right ventricular wall thickness. Right ventricular systolic function is normal. There is mildly elevated pulmonary artery systolic pressure. The tricuspid regurgitant velocity is 2.86  m/s, and with an assumed right atrial pressure of 5 mmHg, the estimated right ventricular systolic pressure is 89.2 mmHg. Left Atrium: Left atrial size was mildly dilated. Right Atrium: Right atrial size was normal in size. Pericardium: There is no evidence of pericardial effusion. Mitral Valve: The mitral valve is normal in structure. Mild mitral valve regurgitation. No evidence of mitral valve stenosis. Tricuspid Valve: The tricuspid valve is normal in structure. Tricuspid valve regurgitation is mild . No evidence of tricuspid stenosis. Aortic Valve: The aortic valve is normal in structure. Aortic valve regurgitation is not visualized. Mild aortic valve sclerosis is present, with no evidence of aortic valve stenosis. Aortic valve  peak gradient measures 4.8 mmHg. Pulmonic Valve: The pulmonic valve was normal in structure. Pulmonic valve regurgitation is not visualized. No evidence of pulmonic stenosis. Aorta: The aortic root is normal in size and structure. Venous: The inferior vena cava is normal in size with greater than 50% respiratory  variability, suggesting right atrial pressure of 3 mmHg. IAS/Shunts: No atrial level shunt detected by color flow Doppler.  LEFT VENTRICLE PLAX 2D LVIDd:         4.08 cm  Diastology LVIDs:         2.73 cm  LV e' medial:    5.11 cm/s LV PW:         1.07 cm  LV E/e' medial:  12.6 LV IVS:        1.11 cm  LV e' lateral:   5.55 cm/s LVOT diam:     1.80 cm  LV E/e' lateral: 11.6 LV SV:         41 LV SV Index:   23 LVOT Area:     2.54 cm  RIGHT VENTRICLE RV Basal diam:  2.84 cm RV S prime:     10.10 cm/s TAPSE (M-mode): 1.3 cm LEFT ATRIUM             Index       RIGHT ATRIUM          Index LA diam:        4.10 cm 2.30 cm/m  RA Area:     8.93 cm LA Vol (A2C):   27.5 ml 15.40 ml/m RA Volume:   15.90 ml 8.91 ml/m LA Vol (A4C):   31.8 ml 17.81 ml/m LA Biplane Vol: 30.1 ml 16.86 ml/m  AORTIC VALVE AV Area (Vmax): 2.13 cm AV Vmax:        110.00 cm/s AV Peak Grad:   4.8 mmHg LVOT Vmax:      92.00 cm/s LVOT Vmean:     61.700 cm/s LVOT VTI:       0.161 m  AORTA Ao Root diam: 3.00 cm Ao Asc diam:  3.40 cm MITRAL VALVE               TRICUSPID VALVE MV Area (PHT): 5.75 cm    TV Peak grad:   32.7 mmHg MV Decel Time: 132 msec    TV Vmax:        2.86 m/s MV E velocity: 64.30 cm/s  TR Peak grad:   32.7 mmHg MV A velocity: 75.80 cm/s  TR Vmax:        286.00 cm/s MV E/A ratio:  0.85                            SHUNTS                            Systemic VTI:  0.16 m                            Systemic Diam: 1.80 cm Ida Rogue MD Electronically signed by Ida Rogue MD Signature Date/Time: 02/22/2021/1:41:54 PM    Final    US Abdomen Limited RUQ (LIVER/GB)  Result Date: 02/17/2021 CLINICAL DATA:  Elevated liver function test. EXAM: ULTRASOUND ABDOMEN LIMITED RIGHT UPPER QUADRANT COMPARISON:  None. FINDINGS: Gallbladder: No gallstones or wall thickening visualized (2.0 mm). No sonographic Murphy sign noted by sonographer. Common bile  duct: Diameter: 2.3 mm Liver: An 8.8 cm x 8.4 cm x 8.4 cm heterogeneous liver mass is seen  within the right lobe of the liver. There is diffusely increased echogenicity of the liver parenchyma. Portal vein is patent on color Doppler imaging with normal direction of blood flow towards the liver. Other: None. IMPRESSION: 1. Large heterogeneous liver mass, worrisome for the presence of an underlying neoplasm. Further evaluation with hepatic MRI is recommended. Electronically Signed   By: Virgina Norfolk M.D.   On: 02/17/2021 20:05    PERFORMANCE STATUS (ECOG) : 3 - Symptomatic, >50% confined to bed  Review of Systems Unable to complete  Physical Exam General: NAD Pulmonary: Unlabored Extremities: no edema, no joint deformities Skin: no rashes Neurological: Weakness, confusion  IMPRESSION: Patient is confused with mumbling speech pattern that makes communication difficult.  He is comfortable appearing.  I called and spoke with his legal guardian, Robert Little.  Together, we reviewed the work-up today.  Robert Little verbalized understanding that patient is felt to have a widely metastatic cancer that is unlikely amenable to treatment. He is hopeful that patient can return to SNF at time of discharge.  We discussed the option of hospice involvement and he was in agreement.  He had multiple questions about hospice care, prognosis, and end-of-life care, which were answered to his verbalized satisfaction.  I will consult the hospice liaison to coordinate.  Robert Little says that he is hopeful that he will be able to coordinate getting patient moved at some point in the future to [place], as to allow the patient to be closer to the daughter at end-of-life.  Patient is a DNR/DNI.  PLAN: -Best supportive care -Consult hospice -SNF with hospice following -DNR/DNI   Case and plan discussed with Dr. Rogue Bussing  Time Total: 60 minutes  Visit consisted of counseling and education dealing with the complex and emotionally intense issues of symptom management and palliative care in  the setting of serious and potentially life-threatening illness.Greater than 50%  of this time was spent counseling and coordinating care related to the above assessment and plan.  Signed by: Altha Harm, PhD, NP-C

## 2021-02-23 NOTE — Evaluation (Signed)
Clinical/Bedside Swallow Evaluation Patient Details  Name: Robert Little MRN: [id number] Date of Birth: [birth year]  Today's Date: 02/23/2021 Time: SLP Start Time (ACUTE ONLY): 1250 SLP Stop Time (ACUTE ONLY): 1320 SLP Time Calculation (min) (ACUTE ONLY): 30 min  Past Medical History:  Past Medical History:  Diagnosis Date   Anemia    Chronic diastolic CHF (congestive heart failure) (HCC)    Chronic respiratory failure (HCC)    CKD (chronic kidney disease), stage IV ([place])    Coronary artery disease    CABG 2005 at [facility]   COVID-19 11/2019   pneumonia   Diabetes mellitus ([place])    Dysphagia    GERD (gastroesophageal reflux disease)    Hypertension    Lung nodule    Pulmonary fibrosis ([place])    Schizo affective schizophrenia ([place])    Tobacco abuse    Past Surgical History:  Past Surgical History:  Procedure Laterality Date   CORONARY ARTERY BYPASS GRAFT     HPI:  From admitting H&P "Robert Little is a 80 y.o. male with medical history significant for schizophrenia, insulin-dependent diabetes mellitus, anxiety, hyperlipidemia, GERD, hypertension, CAD, CKD 3/CKD 4, chronic anemia, presents to the emergency department from facility for chief concerns of weakness.     At bedside, he is able to tell me his name, age, and location of hospital.      He states he's been hurting all over for 1 month. He reports he has had a cough for several weeks. I did no specify if the cough is productive, as he yells at me whenever I attempt to ask patient for quality, quantity, duration information regarding his presenting symptoms.     He endorses that he has had poor appetite. When I asked why, he states 'damn it, my mouth is sore. God damn it,' When I asked if he had dysuria, he states yes. I asked how long the burning with urination has been going on, he states 'I don't know damni it.'     I asked the patient that if his heart were to stop beating would he want chest compression he said, "Damn it, just let  me go. I don't want no pounding." Regarding intubation, he yells, "NO, leave me alone"     Patient is irritable and easily annoyed with questions and does not want to fully participate in physical exam.     Social history: Patient is from facility.  Unknown tobacco, EtOH, recreational drug use."   Assessment / Plan / Recommendation Clinical Impression  Bedside swallow eval today was very limited as Pt refused to take more than a few sips. Pt agreed to be seated upright but then yelled while ST was helping to reposition him. He took a few continuous sips of thin water and thin milk with no overt s/s of aspiration. ST encouraged bites of solid lunch tray and bites of applesauce but Pt refused. When asked if he was gungry he yelled " leave me alone" Pt had some dysphagia with thin liquids earlier this am with Nsg. Will alter diet to Dys 2 as Pt has very poor dentition. Will reattempt solids and further trials of thin liquids as treatment session. Rec meds crushed in applesauce or given in magic cup. SLP Visit Diagnosis: Dysphagia, oropharyngeal phase (R13.12)    Aspiration Risk  Mild aspiration risk    Diet Recommendation Dysphagia 2 (Fine chop)   Liquid Administration via: Straw;Other (Comment) (single sips) Medication Administration: Crushed with puree Supervision: Full supervision/cueing for compensatory strategies  Compensations: Small sips/bites;Minimize environmental distractions;Slow rate;Other (Comment) (Offer food frequebntly as Pt often refuses) Postural Changes: Seated upright at 90 degrees;Remain upright for at least 30 minutes after po intake    Other  Recommendations     Follow up Recommendations Skilled Nursing facility      Frequency and Duration            Prognosis Barriers to Reach Goals: Cognitive deficits      Swallow Study   General Date of Onset: 02/23/21 HPI: From admitting H&P "Robert Little is a 80 y.o. male with medical history significant for schizophrenia,  insulin-dependent diabetes mellitus, anxiety, hyperlipidemia, GERD, hypertension, CAD, CKD 3/CKD 4, chronic anemia, presents to the emergency department from facility for chief concerns of weakness.     At bedside, he is able to tell me his name, age, and location of hospital.      He states he's been hurting all over for 1 month. He reports he has had a cough for several weeks. I did no specify if the cough is productive, as he yells at me whenever I attempt to ask patient for quality, quantity, duration information regarding his presenting symptoms.     He endorses that he has had poor appetite. When I asked why, he states 'damn it, my mouth is sore. God damn it,' When I asked if he had dysuria, he states yes. I asked how long the burning with urination has been going on, he states 'I don't know damni it.'     I asked the patient that if his heart were to stop beating would he want chest compression he said, "Damn it, just let me go. I don't want no pounding." Regarding intubation, he yells, "NO, leave me alone"     Patient is irritable and easily annoyed with questions and does not want to fully participate in physical exam.     Social history: Patient is from facility.  Unknown tobacco, EtOH, recreational drug use." Type of Study: Bedside Swallow Evaluation Diet Prior to this Study: Regular Respiratory Status: Room air History of Recent Intubation: No Behavior/Cognition: Agitated;Uncooperative Oral Cavity - Dentition: Poor condition;Missing dentition Self-Feeding Abilities: Needs assist Patient Positioning: Upright in bed Baseline Vocal Quality: Normal    Oral/Motor/Sensory Function     Ice Chips     Thin Liquid Thin Liquid: Within functional limits Presentation: Straw    Nectar Thick     Honey Thick     Puree     Solid            Robert Little 02/23/2021,1:25 PM

## 2021-02-23 NOTE — Progress Notes (Addendum)
[facility]) Hospital Liaison RN Note  Received request from Belia Heman, RN [place] for hospice services at facility after discharge. Chart and patient information reviewed by [facility] physician. Hospice eligibility confirmed.  Spoke with [name], patient's legal guardian to initiate education related to hospice philosophy, services and team approach to care. Legrand Como verbalized understanding of information given. Per discussion, the plan is for patient to discharge to [facility] via EMS. No discharge date set at this time.   Please send signed and completed DNR to facility with patient at discharge. Please provide prescriptions at discharge as needed to ensure ongoing symptom management.   ACC information and contact numbers given to [name].   Please do not hesitate to call with any hospice related questions or concerns.   Thank you for the opportunity to participate in this patient's care.   Bobbie "Loren Racer, RN, BSN [facility] Liaison [phone]

## 2021-02-23 NOTE — Care Management Important Message (Signed)
Important Message  Patient Details  Name: Robert Little MRN: [id number] Date of Birth: 11/23/1940   Medicare Important Message Given:  Yes  Reviewed Medicare IM with [name], legal guardian at [phone].  Copy of Medicare IM sent securely to Michael's attention at email address provided: [email or web address].     Dannette Barbara 02/23/2021, 2:42 PM

## 2021-02-23 NOTE — Progress Notes (Signed)
PROGRESS NOTE    Robert Little  YHC:[id number] DOB: [birth year] DOA: 02/17/2021 PCP: [name]    Brief Narrative:  Robert Little is a 80 y.o. male with medical history significant for schizophrenia, insulin-dependent diabetes mellitus, anxiety, hyperlipidemia, GERD, hypertension, CAD, CKD 3/CKD 4, chronic anemia, presents to the emergency department from facility for chief concerns of weakness.Patient is irritable and easily annoyed with questions and does not want to fully participate in physical exam  8/17- in foul mood this am. Yells back with any answers.Unable to obtain good history.BG 67 this am 8/18 has no complaints. Less yelling today 8/19 blood culture Aerococcus viridans. 8/20 no overnight issues. MRI completed 8/21 this am pt was hypotensive, fever of 101.1, RR 38, tachycardic.MD was not made aware.  8/22 no overnight issues.  Afebrile  Consultants:  ID  Procedures:  Hepatic MRI  Antimicrobials:   Ceftriaxone,, azith   Subjective: Denies shortness of breath, chills, no chest pain.  Objective: Vitals:   02/22/21 1734 02/22/21 1931 02/22/21 2337 02/23/21 0353  BP: (!) 151/70 121/68 111/62 (!) 119/58  Pulse: 96 86 89 88  Resp: (!) 30 (!) 28 (!) 30 (!) 32  Temp:  98.2 F (36.8 C) 98.4 F (36.9 C) 98.3 F (36.8 C)  TempSrc:  Oral    SpO2: 100% 100% 93% 97%  Weight:      Height:        Intake/Output Summary (Last 24 hours) at 02/23/2021 0834 Last data filed at 02/23/2021 0411 Gross per 24 hour  Intake 1338.68 ml  Output 1200 ml  Net 138.68 ml   Filed Weights   02/17/21 1359  Weight: 66 kg    Examination: Less anxious today.  NAD Decreased breath sounds no wheezing  regular S1-S2 no gallops Soft benign positive bowel sounds No edema Grossly intact     Data Reviewed: I have personally reviewed following labs and imaging studies  CBC: Recent Labs  Lab 02/17/21 1343 02/18/21 0403 02/19/21 0501 02/20/21 0340  02/22/21 0452  WBC 17.9* 19.1* 17.6* 17.8* 16.4*  HGB 7.4* 6.4* 8.3* 8.9* 8.9*  HCT 24.2* 21.0* 27.0* 28.6* 28.3*  MCV 84.0 84.0 84.9 80.8 82.5  PLT 384 355 314 354 517   Basic Metabolic Panel: Recent Labs  Lab 02/17/21 1343 02/18/21 0403 02/20/21 0340 02/22/21 0858 02/23/21 0558  NA 134* 138  --  136  --   K 5.5* 3.9  --  3.9  --   CL 104 110  --  103  --   CO2 19* 21*  --  22  --   GLUCOSE 152* 62*  --  105*  --   BUN 61* 53*  --  37*  --   CREATININE 2.22* 1.88* 1.50* 1.61* 1.54*  CALCIUM 9.1 8.6*  --  9.0  --    GFR: Estimated Creatinine Clearance: 36.9 mL/min (A) (by C-G formula based on SCr of 1.54 mg/dL (H)). Liver Function Tests: Recent Labs  Lab 02/17/21 1343 02/18/21 0403  AST 48* 30  ALT 31 27  ALKPHOS 210* 200*  BILITOT 1.6* 0.8  PROT 6.6 5.9*  ALBUMIN 2.1* 2.0*   No results for input(s): LIPASE, AMYLASE in the last 168 hours. No results for input(s): AMMONIA in the last 168 hours. Coagulation Profile: No results for input(s): INR, PROTIME in the last 168 hours. Cardiac Enzymes: No results for input(s): CKTOTAL, CKMB, CKMBINDEX, TROPONINI in the last 168 hours. BNP (last 3 results) No results for input(s): PROBNP in  the last 8760 hours. HbA1C: No results for input(s): HGBA1C in the last 72 hours.  CBG: Recent Labs  Lab 02/22/21 0738 02/22/21 1125 02/22/21 1713 02/22/21 2204 02/23/21 0828  GLUCAP 105* 103* 217* 94 82   Lipid Profile: No results for input(s): CHOL, HDL, LDLCALC, TRIG, CHOLHDL, LDLDIRECT in the last 72 hours. Thyroid Function Tests: Recent Labs    02/21/21 0508  TSH 4.936*   Anemia Panel: No results for input(s): VITAMINB12, FOLATE, FERRITIN, TIBC, IRON, RETICCTPCT in the last 72 hours.  Sepsis Labs: Recent Labs  Lab 02/22/21 0858  LATICACIDVEN 1.3    Recent Results (from the past 240 hour(s))  Resp Panel by RT-PCR (Flu A&B, Covid) Nasopharyngeal Swab     Status: None   Collection Time: 02/17/21  2:11 PM    Specimen: Nasopharyngeal Swab; Nasopharyngeal(NP) swabs in vial transport medium  Result Value Ref Range Status   SARS Coronavirus 2 by RT PCR NEGATIVE NEGATIVE Final    Comment: (NOTE) SARS-CoV-2 target nucleic acids are NOT DETECTED.  The SARS-CoV-2 RNA is generally detectable in upper respiratory specimens during the acute phase of infection. The lowest concentration of SARS-CoV-2 viral copies this assay can detect is 138 copies/mL. A negative result does not preclude SARS-Cov-2 infection and should not be used as the sole basis for treatment or other patient management decisions. A negative result may occur with  improper specimen collection/handling, submission of specimen other than nasopharyngeal swab, presence of viral mutation(s) within the areas targeted by this assay, and inadequate number of viral copies(<138 copies/mL). A negative result must be combined with clinical observations, patient history, and epidemiological information. The expected result is Negative.  Fact Sheet for Patients:  EntrepreneurPulse.com.au  Fact Sheet for Healthcare Providers:  IncredibleEmployment.be  This test is no t yet approved or cleared by the [place] FDA and  has been authorized for detection and/or diagnosis of SARS-CoV-2 by FDA under an Emergency Use Authorization (EUA). This EUA will remain  in effect (meaning this test can be used) for the duration of the COVID-19 declaration under Section 564(b)(1) of the Act, 21 U.S.C.section 360bbb-3(b)(1), unless the authorization is terminated  or revoked sooner.       Influenza A by PCR NEGATIVE NEGATIVE Final   Influenza B by PCR NEGATIVE NEGATIVE Final    Comment: (NOTE) The Xpert Xpress SARS-CoV-2/FLU/RSV plus assay is intended as an aid in the diagnosis of influenza from Nasopharyngeal swab specimens and should not be used as a sole basis for treatment. Nasal washings and aspirates are  unacceptable for Xpert Xpress SARS-CoV-2/FLU/RSV testing.  Fact Sheet for Patients: EntrepreneurPulse.com.au  Fact Sheet for Healthcare Providers: IncredibleEmployment.be  This test is not yet approved or cleared by the [place] FDA and has been authorized for detection and/or diagnosis of SARS-CoV-2 by FDA under an Emergency Use Authorization (EUA). This EUA will remain in effect (meaning this test can be used) for the duration of the COVID-19 declaration under Section 564(b)(1) of the Act, 21 U.S.C. section 360bbb-3(b)(1), unless the authorization is terminated or revoked.  Performed at [facility], [address] [place], [place] [address]   Blood culture (routine x 2)     Status: Abnormal   Collection Time: 02/17/21  3:19 PM   Specimen: BLOOD  Result Value Ref Range Status   Specimen Description   Final    BLOOD RIGHT ANTECUBITAL Performed at [facility], [address], [place], [place] [address]    Special Requests   Final  BOTTLES DRAWN AEROBIC AND ANAEROBIC Blood Culture results may not be optimal due to an inadequate volume of blood received in culture bottles Performed at [facility], [address] [place], [place] [address]    Culture  Setup Time   Final    Organism ID to follow [place] CRITICAL RESULT CALLED TO, READ BACK BY AND VERIFIED WITH: [facility] MADE 02/18/21 [place] AEROBIC BOTTLE ONLY CRITICAL RESULT CALLED TO, READ BACK BY AND VERIFIED WITH: BRANDON BEERS 02/21/2021 DLB    Culture AEROCOCCUS VIRIDANS GRAM POSITIVE RODS  (A)  Final   Report Status 02/20/2021 FINAL  Final  Blood Culture ID Panel (Reflexed)     Status: None   Collection Time: 02/17/21  3:19 PM  Result Value Ref Range Status   Enterococcus faecalis NOT DETECTED NOT DETECTED Final   Enterococcus Faecium NOT DETECTED NOT DETECTED Final   Listeria monocytogenes  NOT DETECTED NOT DETECTED Final   Staphylococcus species NOT DETECTED NOT DETECTED Final   Staphylococcus aureus (BCID) NOT DETECTED NOT DETECTED Final   Staphylococcus epidermidis NOT DETECTED NOT DETECTED Final   Staphylococcus lugdunensis NOT DETECTED NOT DETECTED Final   Streptococcus species NOT DETECTED NOT DETECTED Final   Streptococcus agalactiae NOT DETECTED NOT DETECTED Final   Streptococcus pneumoniae NOT DETECTED NOT DETECTED Final   Streptococcus pyogenes NOT DETECTED NOT DETECTED Final   A.calcoaceticus-baumannii NOT DETECTED NOT DETECTED Final   Bacteroides fragilis NOT DETECTED NOT DETECTED Final   Enterobacterales NOT DETECTED NOT DETECTED Final   Enterobacter cloacae complex NOT DETECTED NOT DETECTED Final   Escherichia coli NOT DETECTED NOT DETECTED Final   Klebsiella aerogenes NOT DETECTED NOT DETECTED Final   Klebsiella oxytoca NOT DETECTED NOT DETECTED Final   Klebsiella pneumoniae NOT DETECTED NOT DETECTED Final   Proteus species NOT DETECTED NOT DETECTED Final   Salmonella species NOT DETECTED NOT DETECTED Final   Serratia marcescens NOT DETECTED NOT DETECTED Final   Haemophilus influenzae NOT DETECTED NOT DETECTED Final   Neisseria meningitidis NOT DETECTED NOT DETECTED Final   Pseudomonas aeruginosa NOT DETECTED NOT DETECTED Final   Stenotrophomonas maltophilia NOT DETECTED NOT DETECTED Final   Candida albicans NOT DETECTED NOT DETECTED Final   Candida auris NOT DETECTED NOT DETECTED Final   Candida glabrata NOT DETECTED NOT DETECTED Final   Candida krusei NOT DETECTED NOT DETECTED Final   Candida parapsilosis NOT DETECTED NOT DETECTED Final   Candida tropicalis NOT DETECTED NOT DETECTED Final   Cryptococcus neoformans/gattii NOT DETECTED NOT DETECTED Final    Comment: Performed at [facility], [address] [place], [place] [address]  Blood culture (routine x 2)     Status: None   Collection Time: 02/17/21  3:25 PM   Specimen: BLOOD  Result  Value Ref Range Status   Specimen Description BLOOD BLOOD LEFT HAND  Final   Special Requests   Final    BOTTLES DRAWN AEROBIC ONLY Blood Culture results may not be optimal due to an inadequate volume of blood received in culture bottles   Culture   Final    NO GROWTH 5 DAYS Performed at [facility], [address] [place], [place] [address]    Report Status 02/22/2021 FINAL  Final  CULTURE, BLOOD (ROUTINE X 2) w Reflex to ID Panel     Status: None (Preliminary result)   Collection Time: 02/22/21  9:03 AM   Specimen: BLOOD  Result Value Ref Range Status  Specimen Description BLOOD BLOOD RIGHT ARM  Final   Special Requests   Final    BOTTLES DRAWN AEROBIC AND ANAEROBIC Blood Culture adequate volume   Culture   Final    NO GROWTH < 24 HOURS Performed at [facility], [address] [place], [place] [address]    Report Status PENDING  Incomplete  CULTURE, BLOOD (ROUTINE X 2) w Reflex to ID Panel     Status: None (Preliminary result)   Collection Time: 02/22/21  9:08 AM   Specimen: BLOOD  Result Value Ref Range Status   Specimen Description BLOOD RIGHT ANTECUBITAL  Final   Special Requests   Final    BOTTLES DRAWN AEROBIC AND ANAEROBIC Blood Culture adequate volume   Culture   Final    NO GROWTH < 24 HOURS Performed at [facility], [address] [place], [place] [address]    Report Status PENDING  Incomplete         Radiology Studies: DG Chest Port 1 View  Result Date: 02/22/2021 CLINICAL DATA:  Altered mental status. Weakness. Hypertension diabetes EXAM: PORTABLE CHEST 1 VIEW COMPARISON:  02/17/2021 FINDINGS: Midline trachea. Normal heart size. Atherosclerosis in the transverse aorta. Prior median sternotomy. No pleural effusion or pneumothorax. Lower lung predominant interstitial thickening is consistent with usual interstitial pneumonitis on prior CT. Right hilar soft tissue fullness is similar to on the prior exam, but not readily  apparent on more remote studies. IMPRESSION: Interstitial lung disease, as before. Right hilar soft tissue fullness. This likely represents mass/adenopathy when correlated with yesterday's MRI. Electronically Signed   By: Abigail Miyamoto M.D.   On: 02/22/2021 09:16   ECHOCARDIOGRAM COMPLETE  Result Date: 02/22/2021    ECHOCARDIOGRAM REPORT   Patient Name:   DELEON PASSE Date of Exam: 02/22/2021 Medical Rec #:  [id number]    Height:       68.0 in Accession #:    [id number]   Weight:       145.5 lb Date of Birth:  05-05-1941    BSA:          1.785 m Patient Age:    68 years     BP:           98/53 mmHg Patient Gender: M            HR:           98 bpm. Exam Location:  [facility] Procedure: 2D Echo Indications:     Endocarditis I38  History:         Patient has prior history of Echocardiogram examinations, most                  recent 01/24/2020.  Sonographer:     Kathlen Brunswick RDCS Referring Phys:  [id number] [place] Diagnosing Phys: Ida Rogue MD  Sonographer Comments: No subcostal window. Image acquisition challenging due to respiratory motion. IMPRESSIONS  1. No valve vegetation noted  2. Left ventricular ejection fraction, by estimation, is 60 to 65%. The left ventricle has normal function. The left ventricle has no regional wall motion abnormalities. Left ventricular diastolic parameters are consistent with Grade I diastolic dysfunction (impaired relaxation).  3. Right ventricular systolic function is normal. The right ventricular size is normal. There is mildly elevated pulmonary artery systolic pressure.  4. Left atrial size was mildly dilated.  5. The mitral valve is normal in structure. Mild mitral valve regurgitation. FINDINGS  Left Ventricle: Left ventricular ejection fraction, by estimation, is 60  to 65%. The left ventricle has normal function. The left ventricle has no regional wall motion abnormalities. The left ventricular internal cavity size was normal in size. There is  no left ventricular  hypertrophy. Left ventricular diastolic parameters are consistent with Grade I diastolic dysfunction (impaired relaxation). Right Ventricle: The right ventricular size is normal. No increase in right ventricular wall thickness. Right ventricular systolic function is normal. There is mildly elevated pulmonary artery systolic pressure. The tricuspid regurgitant velocity is 2.86  m/s, and with an assumed right atrial pressure of 5 mmHg, the estimated right ventricular systolic pressure is 05.3 mmHg. Left Atrium: Left atrial size was mildly dilated. Right Atrium: Right atrial size was normal in size. Pericardium: There is no evidence of pericardial effusion. Mitral Valve: The mitral valve is normal in structure. Mild mitral valve regurgitation. No evidence of mitral valve stenosis. Tricuspid Valve: The tricuspid valve is normal in structure. Tricuspid valve regurgitation is mild . No evidence of tricuspid stenosis. Aortic Valve: The aortic valve is normal in structure. Aortic valve regurgitation is not visualized. Mild aortic valve sclerosis is present, with no evidence of aortic valve stenosis. Aortic valve peak gradient measures 4.8 mmHg. Pulmonic Valve: The pulmonic valve was normal in structure. Pulmonic valve regurgitation is not visualized. No evidence of pulmonic stenosis. Aorta: The aortic root is normal in size and structure. Venous: The inferior vena cava is normal in size with greater than 50% respiratory variability, suggesting right atrial pressure of 3 mmHg. IAS/Shunts: No atrial level shunt detected by color flow Doppler.  LEFT VENTRICLE PLAX 2D LVIDd:         4.08 cm  Diastology LVIDs:         2.73 cm  LV e' medial:    5.11 cm/s LV PW:         1.07 cm  LV E/e' medial:  12.6 LV IVS:        1.11 cm  LV e' lateral:   5.55 cm/s LVOT diam:     1.80 cm  LV E/e' lateral: 11.6 LV SV:         41 LV SV Index:   23 LVOT Area:     2.54 cm  RIGHT VENTRICLE RV Basal diam:  2.84 cm RV S prime:     10.10 cm/s TAPSE  (M-mode): 1.3 cm LEFT ATRIUM             Index       RIGHT ATRIUM          Index LA diam:        4.10 cm 2.30 cm/m  RA Area:     8.93 cm LA Vol (A2C):   27.5 ml 15.40 ml/m RA Volume:   15.90 ml 8.91 ml/m LA Vol (A4C):   31.8 ml 17.81 ml/m LA Biplane Vol: 30.1 ml 16.86 ml/m  AORTIC VALVE AV Area (Vmax): 2.13 cm AV Vmax:        110.00 cm/s AV Peak Grad:   4.8 mmHg LVOT Vmax:      92.00 cm/s LVOT Vmean:     61.700 cm/s LVOT VTI:       0.161 m  AORTA Ao Root diam: 3.00 cm Ao Asc diam:  3.40 cm MITRAL VALVE               TRICUSPID VALVE MV Area (PHT): 5.75 cm    TV Peak grad:   32.7 mmHg MV Decel Time: 132 msec    TV Vmax:  2.86 m/s MV E velocity: 64.30 cm/s  TR Peak grad:   32.7 mmHg MV A velocity: 75.80 cm/s  TR Vmax:        286.00 cm/s MV E/A ratio:  0.85                            SHUNTS                            Systemic VTI:  0.16 m                            Systemic Diam: 1.80 cm Ida Rogue MD Electronically signed by Ida Rogue MD Signature Date/Time: 02/22/2021/1:41:54 PM    Final         Scheduled Meds:  atorvastatin  20 mg Oral QHS   dextromethorphan-guaiFENesin  1 tablet Oral BID   enoxaparin (LOVENOX) injection  40 mg Subcutaneous Q24H   feeding supplement  237 mL Oral TID BM   ferrous sulfate  325 mg Oral BID WC   fluticasone furoate-vilanterol  1 puff Inhalation Daily   insulin aspart  0-5 Units Subcutaneous QHS   insulin aspart  0-9 Units Subcutaneous TID WC   insulin glargine-yfgn  5 Units Subcutaneous QHS   multivitamin with minerals  1 tablet Oral Daily   pantoprazole  40 mg Oral Daily   QUEtiapine  25 mg Oral Daily   tamsulosin  0.4 mg Oral QPC supper   umeclidinium bromide  1 puff Inhalation Daily   vitamin B-12  500 mcg Oral BID   Continuous Infusions:  sodium chloride 75 mL/hr at 02/23/21 0502   meropenem (MERREM) IV Stopped (02/22/21 2240)   vancomycin      Assessment & Plan:   Principal Problem:   Left lower lobe pneumonia Active Problems:    Essential hypertension   Schizo affective schizophrenia ([place])   HLD (hyperlipidemia)   CKD (chronic kidney disease), stage IV (HCC)   Malnutrition of moderate degree   Elevated troponin   Paroxysmal atrial fibrillation (HCC)   Neutrophilic leukocytosis   Anemia due to chronic kidney disease   Anemia due to stage 4 chronic kidney disease (HCC)   Elevated LFTs   Protein-calorie malnutrition, severe   Liver metastases ([place])   # Left lower lobe pneumonia per chest x-ray read - Patient did endorse cough with me, unclear if it is productive - COVID/influenza A/influenza B PCR were negative 8/19 continue ceftriaxone and azithromycin  PT consulted but difficult to perform as patient has agitated behavior and yelling at the staff  +bcx with aer. Viridans. ID consulted.  8/20 IDs input was appreciated.  1 out of 4 anaerobic bottle could be true pathogen versus contaminant.  Due to the liver mass this possibly could be a true pathogen.  Ceftriaxone will cover this and will continue. Occasional known to cause endocarditis. 8/21-ck echo and change abx to meropenem/vanco. Repeat cxr  8/22 Echo with normal EF.  Diastolic 1 dysfunction.  No vegetations noted. Continue meropenem and vancomycin Repeat blood cultures pending   #SIRS/sepsis- based on fever, tachycardia, tachypnia. +bcx Hemodynamics improving Continue IV fluids Continue IV antibiotics as above UA negative Add midodrine for BP support    # Acute on chronic anemia - Hemoglobin ranged of 7.7-8.6 in 2021 - CBC in the a.m. - Patient denies nausea vomiting, black stool, bleeding and  is otherwise hemodynamically stable 8/17- continue iron pills Ck stool occult Hg 6.4, spoke to legal guardian [name] and discussed transfusion and possible complications and he is agreeable. Told me pt was transfused last year without any issues. Will transfuse 1prbc, ck post h/h, if <7 will give a second unit DC heparin subcu and aspirin until  GI bleed ruled out 8/22: Negative H&H has remained stable Check periodically   #Rt LL lung mass- found on MRI liver. Palliative care consulted to see patient since patient found with mets.  See below   # Elevated liver enzymes Improved Ruq DI:YMEBR heterogeneous liver mass, worrisome for the presence of an underlying neoplasm. Further evaluation with hepatic MRI is recommended. 8/20-MRI with hepatic revealed widespread mets disease including to liver, bones, peritoneum, thoracic nodes, subcutaneous tissue.  Stable right lower lobe primary bronchogenic carcinoma per radiology  8/21 oncology's input was appreciated.  Oncology spoke to legal guardian and given patient's multiple comorbidities, psychiatric condition patient is not a candidate for any treatment for cancer.  Oncology recommends not doing biopsy or further work-up for At this is would not change patient's final management. Will request palliative care evaluation in a.m.   # History of chronic leukocytosis - WBC has been chronically elevated in 2021 - This is the highest level its been 8/19- possibly due to infection  Continue to monitor          # Severe protein/calorie malnutrition-continue ensure   # Elevated troponin with downtime trending delta troponin - Patient denies chest pain and shortness of breath at this time - Low clinical suspicion for ACS Likely due to demand ischemia   # Insulin-dependent diabetes mellitus-I suspect this is type II - Resumed home long-acting 10 units nightly - Insulin SSI with at bedtime coverage ordered A1c 6.9  # Query dementia # Schizophrenia with schizoaffective disorder - Resumed home quetiapine 25 mg p.o. daily   # Anxiety-resume Klonopin 0.25 mg p.o. twice daily as needed for anxiety      DVT prophylaxis: SCD. lovenox Code Status: DNR Family Communication:  disposition Plan:  Status is: Inpatient   The patient remains inpatient due to severity of illness  and requiring IV treatment  Dispo: The patient is from: Group home              Anticipated d/c is to: Group home              Patient currently is not medically stable to d/c.   Difficult to place patient No   Needs palliative consult and IV antibiotics      LOS: 5 days   Time spent: 35 minutes with more than 50% on [place], MD [facility] Pager [phone]  If 7PM-7AM, please contact night-coverage 02/23/2021, 8:34 AM

## 2021-02-24 DIAGNOSIS — J189 Pneumonia, unspecified organism: Secondary | ICD-10-CM | POA: Diagnosis not present

## 2021-02-24 DIAGNOSIS — N1832 Chronic kidney disease, stage 3b: Secondary | ICD-10-CM | POA: Diagnosis not present

## 2021-02-24 DIAGNOSIS — R7989 Other specified abnormal findings of blood chemistry: Secondary | ICD-10-CM | POA: Diagnosis not present

## 2021-02-24 DIAGNOSIS — N184 Chronic kidney disease, stage 4 (severe): Secondary | ICD-10-CM | POA: Diagnosis not present

## 2021-02-24 LAB — GLUCOSE, CAPILLARY
Glucose-Capillary: 131 mg/dL — ABNORMAL HIGH (ref 70–99)
Glucose-Capillary: 108 mg/dL — ABNORMAL HIGH (ref 70–99)
Glucose-Capillary: 116 mg/dL — ABNORMAL HIGH (ref 70–99)

## 2021-02-24 LAB — RESP PANEL BY RT-PCR (FLU A&B, COVID) ARPGX2
Influenza A by PCR: NEGATIVE
Influenza B by PCR: NEGATIVE
SARS Coronavirus 2 by RT PCR: NEGATIVE

## 2021-02-24 LAB — CREATININE, SERUM
Creatinine, Ser: 1.46 mg/dL — ABNORMAL HIGH (ref 0.61–1.24)
GFR, Estimated: 49 mL/min — ABNORMAL LOW (ref >60)

## 2021-02-24 MED ORDER — MIDAZOLAM HCL 2 MG/2ML IJ SOLN
INTRAMUSCULAR | Status: AC
  Filled 2021-02-24: qty 2

## 2021-02-24 MED ORDER — INSULIN GLARGINE 100 UNIT/ML ~~LOC~~ SOLN: 5.0000 [IU] | Freq: Every day | SUBCUTANEOUS | 11 refills | Status: AC

## 2021-02-24 MED ORDER — PROPOFOL 500 MG/50ML IV EMUL
INTRAVENOUS | Status: AC
  Filled 2021-02-24: qty 50

## 2021-02-24 MED ORDER — OXYCODONE-ACETAMINOPHEN 5-325 MG PO TABS: 1.0000 | ORAL_TABLET | ORAL | 0 refills | Status: AC | PRN

## 2021-02-24 MED ORDER — FENTANYL CITRATE (PF) 100 MCG/2ML IJ SOLN
INTRAMUSCULAR | Status: AC
  Filled 2021-02-24: qty 2

## 2021-02-24 MED ORDER — BISACODYL 10 MG RE SUPP
10.0000 mg | Freq: Once | RECTAL | Status: AC
  Administered 2021-02-24: 10 mg via RECTAL
  Filled 2021-02-24: qty 1

## 2021-02-24 MED ORDER — ONDANSETRON HCL 4 MG PO TABS: 4.0000 mg | ORAL_TABLET | Freq: Four times a day (QID) | ORAL | 0 refills | Status: AC | PRN

## 2021-02-24 NOTE — Discharge Summary (Signed)
[name] FXT:[id number] DOB: Mar 28, 1941 DOA: 02/17/2021  PCP: [name]  Admit date: 02/17/2021 Discharge date: 02/24/2021  Admitted From: [facility] Disposition: [facility]  Recommendations for Outpatient Follow-up:  Follow up with PCP in 1 week Please obtain BMP/CBC in one week Hospice to follow other care as outpatient    Discharge Condition:Stable CODE STATUS: DNR Diet recommendation: Carb control    Brief/Interim Summary: Per Robert Little is a 80 y.o. male with medical history significant for schizophrenia, insulin-dependent diabetes mellitus, anxiety, hyperlipidemia, GERD, hypertension, CAD, CKD 3/CKD 4, chronic anemia, presents to the emergency department from facility for chief concerns of weakness.  Patient's blood culture grew Aerococcus viridans. Found with elevated LFT on admission. Was found with pneumonia on admission and was started on iv antibiotics. During his hospitalization he became febrile again, tachycardic and tachypnic. His antibiotics were changed to more broad-spectrum.  Repeat blood cultures, and UA today negative. He was found with metastatic cancer.  Oncology was consulted.  Please see full report below.  He is stable to be discharged back to [facility] with hospice care.   # Left lower lobe pneumonia -present on admission - COVID/influenza A/influenza B PCR were negative Was treated with ceftriaxone and azithromycin  PT consulted but difficult to perform as patient has agitated behavior and yelling at the staff  +bcx with aer. Viridans. ID consulted.  IDs input was appreciated.  1 out of 4 anaerobic bottle could be true pathogen versus contaminant.  Due to the liver mass this possibly could be a true pathogen.  Ceftriaxone will cover this and will continue. Occasional known to cause endocarditis.>>>>echo completed and no vegetations noted.  Echo with normal EF.  Diastolic 1 dysfunction.  No vegetations noted.       #SIRS/sepsis- based on fever, tachycardia, tachypnia. +bcx- no present on admission. This happened during his hospitalization Hemodynamics improved. His antibiotics was broadened to vancomycin and meropenem. Repeat cultures TDN. Added midodrine for bp support     # Acute on chronic anemia - Hemoglobin ranged of 7.7-8.6 in 2021 - CBC in the a.m. - Patient denies nausea vomiting, black stool, bleeding and is otherwise  ontinue iron pills Stool occult negative Hg 6.4, spoke to legal guardian [name] and discussed transfusion and possible complications and he is agreeable. Told me pt was transfused last year without any issues. Status post 1 packed red blood cells unit transfusion and hemoglobin subsequently remained stable     #Rt LL lung mass- found on MRI liver. Also found with metastatic cancer please see below Oncology was consulted.  Eventually palliative and hospice care were consulted.     # Elevated liver enzymes Improved Ruq TM:HDQQI heterogeneous liver mass, worrisome for the presence of an underlying neoplasm. Further evaluation with hepatic MRI is recommended. MRI with hepatic revealed widespread mets disease including to liver, bones, peritoneum, thoracic nodes, subcutaneous tissue.  Stable right lower lobe primary bronchogenic carcinoma per radiology  -Oncology spoke to legal guardian and given patient's multiple comorbidities, psychiatric condition patient is not a candidate for any treatment for cancer.  Oncology recommends not doing biopsy or further work-up for At this is would not change patient's final management. Palliative care and hospice care were consulted.    # History of chronic leukocytosis - WBC has been chronically elevated in 2021 -Mild liver elevation due to infection above     # Severe protein/calorie malnutrition-continue ensure    # Elevated troponin with downtime trending delta troponin -  Patient denies chest pain and shortness of  breath at this time Likely due to demand ischemia   # Insulin-dependent diabetes mellitus-likely type II - Resumed home long-acting 10 units nightly A1c 6.9 Continue with insulin, riss   # Query dementia # Schizophrenia with schizoaffective disorder    # Anxiety-continue home meds             Discharge Diagnoses:  Principal Problem:   Left lower lobe pneumonia Active Problems:   Essential hypertension   Schizo affective schizophrenia ([place])   HLD (hyperlipidemia)   CKD (chronic kidney disease), stage IV (HCC)   Malnutrition of moderate degree   Elevated troponin   Paroxysmal atrial fibrillation (HCC)   Neutrophilic leukocytosis   Anemia due to chronic kidney disease   Anemia due to stage 4 chronic kidney disease (HCC)   Elevated LFTs   Protein-calorie malnutrition, severe   Liver metastases ([place])   Palliative care encounter    Discharge Instructions  Discharge Instructions     Diet Carb Modified   Complete by: As directed    Increase activity slowly   Complete by: As directed       Allergies as of 02/24/2021   No Known Allergies      Medication List     STOP taking these medications    acetaminophen 325 MG tablet Commonly known as: TYLENOL   aspirin 81 MG EC tablet   atorvastatin 20 MG tablet Commonly known as: LIPITOR       TAKE these medications    albuterol 108 (90 Base) MCG/ACT inhaler Commonly known as: VENTOLIN HFA Inhale 2 puffs into the lungs every 6 (six) hours as needed for wheezing or shortness of breath.   clonazePAM 0.25 MG disintegrating tablet Commonly known as: KLONOPIN Take 0.25 mg by mouth 2 (two) times daily.   Eucerin Skin Calming Crea Apply topically in the morning and at bedtime. To legs, arms and back   feeding supplement Liqd Take 237 mLs by mouth 2 (two) times daily between meals.   ferrous sulfate 325 (65 FE) MG tablet Take 1 tablet (325 mg total) by mouth 2 (two) times daily with a meal.    fluticasone furoate-vilanterol 200-25 MCG/INH Aepb Commonly known as: BREO ELLIPTA Inhale 1 puff into the lungs daily.   insulin aspart 100 UNIT/ML injection Commonly known as: novoLOG 0-15 Units, Subcutaneous, 3 times daily with meals Correction coverage: Moderate (average weight, post-op) CBG < 70: Implement Hypoglycemia measures CBG 70 - 120: 0 units CBG 121 - 150: 2 units CBG 151 - 200: 3 units CBG 201 - 250: 5 units CBG 251 - 300: 8 units CBG 301 - 350: 11 units CBG 351 - 400: 15 units CBG > 400: call MD   insulin glargine 100 UNIT/ML injection Commonly known as: LANTUS Inject 0.05 mLs (5 Units total) into the skin daily. What changed: how much to take   ipratropium-albuterol 0.5-2.5 (3) MG/3ML Soln Commonly known as: DUONEB Take 3 mLs by nebulization every 8 (eight) hours.   magnesium oxide 400 MG tablet Commonly known as: MAG-OX Take 400 mg by mouth in the morning, at noon, and at bedtime.   multivitamin with minerals Tabs tablet Take 1 tablet by mouth daily.   ondansetron 4 MG tablet Commonly known as: ZOFRAN Take 1 tablet (4 mg total) by mouth every 6 (six) hours as needed for up to 3 days for nausea.   oxyCODONE-acetaminophen 5-325 MG tablet Commonly known as: PERCOCET/ROXICET Take 1 tablet by mouth  every 4 (four) hours as needed for up to 3 days for moderate pain.   pantoprazole 40 MG tablet Commonly known as: PROTONIX Take 1 tablet (40 mg total) by mouth daily.   polyethylene glycol powder 17 GM/SCOOP powder Commonly known as: MiraLax Take 17 g by mouth 2 (two) times daily as needed for moderate constipation.   QUEtiapine 25 MG tablet Commonly known as: SEROQUEL Take 1 tablet (25 mg total) by mouth at bedtime. What changed:  how much to take when to take this   tamsulosin 0.4 MG Caps capsule Commonly known as: FLOMAX Take 1 capsule (0.4 mg total) by mouth daily after supper. What changed: how much to take   umeclidinium bromide 62.5 MCG/INH  Aepb Commonly known as: INCRUSE ELLIPTA Inhale 1 puff into the lungs daily.   vitamin B-12 500 MCG tablet Commonly known as: CYANOCOBALAMIN Take 500 mcg by mouth 2 (two) times daily.        Contact information for after-discharge care     [facility] .   Service: Skilled Nursing Contact information: [place] [phone]                    No Known Allergies  Consultations: Palliative, hospice, oncology   Procedures/Studies: DG Chest 2 View  Result Date: 02/17/2021 CLINICAL DATA:  Weakness. EXAM: CHEST - 2 VIEW COMPARISON:  01/30/2020 FINDINGS: Previous median sternotomy and CABG procedure. Stable cardiomediastinal contours. Lung volumes are low. There is extensive bilateral lower lung zone predominant diffuse interstitial reticular opacities compatible with fibrotic lung disease. Superimposed airspace opacity is identified within the left lower lobe posteriorly. No pleural effusion or edema. Degenerative disc disease noted within the thoracic spine. IMPRESSION: 1. Chronic interstitial lung disease. 2. Superimposed airspace opacity within the left lower lobe compatible with pneumonia. Followup PA and lateral chest X-ray is recommended in 3-4 weeks following trial of antibiotic therapy to ensure resolution and exclude underlying malignancy. Electronically Signed   By: Kerby Moors M.D.   On: 02/17/2021 14:29   MR LIVER W WO CONTRAST  Result Date: 02/21/2021 CLINICAL DATA:  Elevated liver function tests. Ultrasound demonstrating a heterogeneous right liver lobe mass. EXAM: MRI ABDOMEN WITHOUT AND WITH CONTRAST TECHNIQUE: Multiplanar multisequence MR imaging of the abdomen was performed both before and after the administration of intravenous contrast. CONTRAST:  61mL GADAVIST GADOBUTROL 1 MMOL/ML IV SOLN COMPARISON:  Abdominal ultrasound of 02/17/2021. FINDINGS: Portions of exam are  moderately motion degraded. Lower chest: Tiny bilateral pleural effusions. Node within the azygoesophageal recess measures 1.9 cm on 01/04. A right lower lobe lung mass measures 4.0 x 3.6 cm on 01/04. Left posterior chest wall mass measures 4.2 cm on 03/04. Mild cardiomegaly with prior median sternotomy. Hepatobiliary: Mass centered in the posterior right hepatic lobe, primarily segments 6 and 7, measures on the order of 9.8 x 7.2 cm on 33/15. Hepatomegaly at 18.6 cm craniocaudal. Normal gallbladder, without biliary ductal dilatation. Pancreas:  Normal, without mass or ductal dilatation. Spleen:  Normal in size, without focal abnormality. Adrenals/Urinary Tract: Left adrenal lesion of 2.2 cm on 41/15. Mild right adrenal thickening. Left larger than right renal cysts. No enhancing renal mass or hydronephrosis. Stomach/Bowel: Proximal gastric underdistention. Normal abdominal bowel loops. Vascular/Lymphatic: Aortic atherosclerosis. No abdominal adenopathy. Other: No ascites. A right pericolic gutter 8 mm nodule on 74/15 corresponds to restricted diffusion on 67/7, likely indicative of peritoneal metastasis. A subcutaneous left-sided chest  wall nodule of 8 mm on 28/15 also demonstrates restricted diffusion on 47/7 and likely represents a metastasis. Musculoskeletal: Extensive osseous metastasis, including within the L3 vertebral body on 25/19 and 10/3. Left-sided L1 and inferior T10 metastasis also identified. IMPRESSION: 1. Widespread metastatic disease, including to the liver, bones, peritoneum, thoracic nodes, and subcutaneous tissues. Favor right lower lobe primary bronchogenic carcinoma. Consider multidisciplinary thoracic oncology consultation. 2. Probable adrenal metastasis. 3. Moderately motion degraded exam. 4. Small bilateral pleural effusions. 5.  Aortic Atherosclerosis (ICD10-I70.0). Electronically Signed   By: Abigail Miyamoto M.D.   On: 02/21/2021 07:04   DG Chest Port 1 View  Result Date:  02/22/2021 CLINICAL DATA:  Altered mental status. Weakness. Hypertension diabetes EXAM: PORTABLE CHEST 1 VIEW COMPARISON:  02/17/2021 FINDINGS: Midline trachea. Normal heart size. Atherosclerosis in the transverse aorta. Prior median sternotomy. No pleural effusion or pneumothorax. Lower lung predominant interstitial thickening is consistent with usual interstitial pneumonitis on prior CT. Right hilar soft tissue fullness is similar to on the prior exam, but not readily apparent on more remote studies. IMPRESSION: Interstitial lung disease, as before. Right hilar soft tissue fullness. This likely represents mass/adenopathy when correlated with yesterday's MRI. Electronically Signed   By: Abigail Miyamoto M.D.   On: 02/22/2021 09:16   ECHOCARDIOGRAM COMPLETE  Result Date: 02/22/2021    ECHOCARDIOGRAM REPORT   Patient Name:   Robert Little Date of Exam: 02/22/2021 Medical Rec #:  [id number]    Height:       68.0 in Accession #:    [id number]   Weight:       145.5 lb Date of Birth:  16-Jun-1941    BSA:          1.785 m Patient Age:    69 years     BP:           98/53 mmHg Patient Gender: M            HR:           98 bpm. Exam Location:  [facility] Procedure: 2D Echo Indications:     Endocarditis I38  History:         Patient has prior history of Echocardiogram examinations, most                  recent 01/24/2020.  Sonographer:     Kathlen Brunswick RDCS Referring Phys:  [id number] [place] Diagnosing Phys: Ida Rogue MD  Sonographer Comments: No subcostal window. Image acquisition challenging due to respiratory motion. IMPRESSIONS  1. No valve vegetation noted  2. Left ventricular ejection fraction, by estimation, is 60 to 65%. The left ventricle has normal function. The left ventricle has no regional wall motion abnormalities. Left ventricular diastolic parameters are consistent with Grade I diastolic dysfunction (impaired relaxation).  3. Right ventricular systolic function is normal. The right ventricular size is normal.  There is mildly elevated pulmonary artery systolic pressure.  4. Left atrial size was mildly dilated.  5. The mitral valve is normal in structure. Mild mitral valve regurgitation. FINDINGS  Left Ventricle: Left ventricular ejection fraction, by estimation, is 60 to 65%. The left ventricle has normal function. The left ventricle has no regional wall motion abnormalities. The left ventricular internal cavity size was normal in size. There is  no left ventricular hypertrophy. Left ventricular diastolic parameters are consistent with Grade I diastolic dysfunction (impaired relaxation). Right Ventricle: The right ventricular size is normal. No increase in right ventricular wall thickness. Right ventricular systolic function  is normal. There is mildly elevated pulmonary artery systolic pressure. The tricuspid regurgitant velocity is 2.86  m/s, and with an assumed right atrial pressure of 5 mmHg, the estimated right ventricular systolic pressure is 33.8 mmHg. Left Atrium: Left atrial size was mildly dilated. Right Atrium: Right atrial size was normal in size. Pericardium: There is no evidence of pericardial effusion. Mitral Valve: The mitral valve is normal in structure. Mild mitral valve regurgitation. No evidence of mitral valve stenosis. Tricuspid Valve: The tricuspid valve is normal in structure. Tricuspid valve regurgitation is mild . No evidence of tricuspid stenosis. Aortic Valve: The aortic valve is normal in structure. Aortic valve regurgitation is not visualized. Mild aortic valve sclerosis is present, with no evidence of aortic valve stenosis. Aortic valve peak gradient measures 4.8 mmHg. Pulmonic Valve: The pulmonic valve was normal in structure. Pulmonic valve regurgitation is not visualized. No evidence of pulmonic stenosis. Aorta: The aortic root is normal in size and structure. Venous: The inferior vena cava is normal in size with greater than 50% respiratory variability, suggesting right atrial pressure of  3 mmHg. IAS/Shunts: No atrial level shunt detected by color flow Doppler.  LEFT VENTRICLE PLAX 2D LVIDd:         4.08 cm  Diastology LVIDs:         2.73 cm  LV e' medial:    5.11 cm/s LV PW:         1.07 cm  LV E/e' medial:  12.6 LV IVS:        1.11 cm  LV e' lateral:   5.55 cm/s LVOT diam:     1.80 cm  LV E/e' lateral: 11.6 LV SV:         41 LV SV Index:   23 LVOT Area:     2.54 cm  RIGHT VENTRICLE RV Basal diam:  2.84 cm RV S prime:     10.10 cm/s TAPSE (M-mode): 1.3 cm LEFT ATRIUM             Index       RIGHT ATRIUM          Index LA diam:        4.10 cm 2.30 cm/m  RA Area:     8.93 cm LA Vol (A2C):   27.5 ml 15.40 ml/m RA Volume:   15.90 ml 8.91 ml/m LA Vol (A4C):   31.8 ml 17.81 ml/m LA Biplane Vol: 30.1 ml 16.86 ml/m  AORTIC VALVE AV Area (Vmax): 2.13 cm AV Vmax:        110.00 cm/s AV Peak Grad:   4.8 mmHg LVOT Vmax:      92.00 cm/s LVOT Vmean:     61.700 cm/s LVOT VTI:       0.161 m  AORTA Ao Root diam: 3.00 cm Ao Asc diam:  3.40 cm MITRAL VALVE               TRICUSPID VALVE MV Area (PHT): 5.75 cm    TV Peak grad:   32.7 mmHg MV Decel Time: 132 msec    TV Vmax:        2.86 m/s MV E velocity: 64.30 cm/s  TR Peak grad:   32.7 mmHg MV A velocity: 75.80 cm/s  TR Vmax:        286.00 cm/s MV E/A ratio:  0.85  SHUNTS                            Systemic VTI:  0.16 m                            Systemic Diam: 1.80 cm Ida Rogue MD Electronically signed by Ida Rogue MD Signature Date/Time: 02/22/2021/1:41:54 PM    Final    US Abdomen Limited RUQ (LIVER/GB)  Result Date: 02/17/2021 CLINICAL DATA:  Elevated liver function test. EXAM: ULTRASOUND ABDOMEN LIMITED RIGHT UPPER QUADRANT COMPARISON:  None. FINDINGS: Gallbladder: No gallstones or wall thickening visualized (2.0 mm). No sonographic Murphy sign noted by sonographer. Common bile duct: Diameter: 2.3 mm Liver: An 8.8 cm x 8.4 cm x 8.4 cm heterogeneous liver mass is seen within the right lobe of the liver. There is  diffusely increased echogenicity of the liver parenchyma. Portal vein is patent on color Doppler imaging with normal direction of blood flow towards the liver. Other: None. IMPRESSION: 1. Large heterogeneous liver mass, worrisome for the presence of an underlying neoplasm. Further evaluation with hepatic MRI is recommended. Electronically Signed   By: Virgina Norfolk M.D.   On: 02/17/2021 20:05      Subjective: Yelling this am. Denies sob,cp  Discharge Exam: Vitals:   02/24/21 0837 02/24/21 0915  BP: 133/67   Pulse: 98   Resp: (!) 28 (!) 28  Temp: 97.7 F (36.5 C)   SpO2: 100%    Vitals:   02/24/21 0416 02/24/21 0500 02/24/21 0837 02/24/21 0915  BP: 136/66  133/67   Pulse: 88  98   Resp: (!) 22  (!) 28 (!) 28  Temp: 98.5 F (36.9 C)  97.7 F (36.5 C)   TempSrc:   Axillary   SpO2: 99%  100%   Weight:  58.2 kg    Height:        General: Pt is alert, awake, nad, yelling Cardiovascular:RRR s1/s2 no gallop Respiratory: CTA bilaterally, no wheezing, no rhonchi Abdominal: Soft, NT, ND, bowel sounds + Extremities: no edema    The results of significant diagnostics from this hospitalization (including imaging, microbiology, ancillary and laboratory) are listed below for reference.     Microbiology: Recent Results (from the past 240 hour(s))  Resp Panel by RT-PCR (Flu A&B, Covid) Nasopharyngeal Swab     Status: None   Collection Time: 02/17/21  2:11 PM   Specimen: Nasopharyngeal Swab; Nasopharyngeal(NP) swabs in vial transport medium  Result Value Ref Range Status   SARS Coronavirus 2 by RT PCR NEGATIVE NEGATIVE Final    Comment: (NOTE) SARS-CoV-2 target nucleic acids are NOT DETECTED.  The SARS-CoV-2 RNA is generally detectable in upper respiratory specimens during the acute phase of infection. The lowest concentration of SARS-CoV-2 viral copies this assay can detect is 138 copies/mL. A negative result does not preclude SARS-Cov-2 infection and should not be used as  the sole basis for treatment or other patient management decisions. A negative result may occur with  improper specimen collection/handling, submission of specimen other than nasopharyngeal swab, presence of viral mutation(s) within the areas targeted by this assay, and inadequate number of viral copies(<138 copies/mL). A negative result must be combined with clinical observations, patient history, and epidemiological information. The expected result is Negative.  Fact Sheet for Patients:  EntrepreneurPulse.com.au  Fact Sheet for Healthcare Providers:  IncredibleEmployment.be  This test is no t yet approved or cleared  by the [place] and  has been authorized for detection and/or diagnosis of SARS-CoV-2 by FDA under an Emergency Use Authorization (EUA). This EUA will remain  in effect (meaning this test can be used) for the duration of the COVID-19 declaration under Section 564(b)(1) of the Act, 21 U.S.C.section 360bbb-3(b)(1), unless the authorization is terminated  or revoked sooner.       Influenza A by PCR NEGATIVE NEGATIVE Final   Influenza B by PCR NEGATIVE NEGATIVE Final    Comment: (NOTE) The Xpert Xpress SARS-CoV-2/FLU/RSV plus assay is intended as an aid in the diagnosis of influenza from Nasopharyngeal swab specimens and should not be used as a sole basis for treatment. Nasal washings and aspirates are unacceptable for Xpert Xpress SARS-CoV-2/FLU/RSV testing.  Fact Sheet for Patients: EntrepreneurPulse.com.au  Fact Sheet for Healthcare Providers: IncredibleEmployment.be  This test is not yet approved or cleared by the [place] FDA and has been authorized for detection and/or diagnosis of SARS-CoV-2 by FDA under an Emergency Use Authorization (EUA). This EUA will remain in effect (meaning this test can be used) for the duration of the COVID-19 declaration under Section 564(b)(1) of  the Act, 21 U.S.C. section 360bbb-3(b)(1), unless the authorization is terminated or revoked.  Performed at [facility], [address] [place], [place] [address]   Blood culture (routine x 2)     Status: Abnormal   Collection Time: 02/17/21  3:19 PM   Specimen: BLOOD  Result Value Ref Range Status   Specimen Description   Final    BLOOD RIGHT ANTECUBITAL Performed at [facility], [address] [place], [place] [address]    Special Requests   Final    BOTTLES DRAWN AEROBIC AND ANAEROBIC Blood Culture results may not be optimal due to an inadequate volume of blood received in culture bottles Performed at [facility], [address] [place], [place] [address]    Culture  Setup Time   Final    Organism ID to follow [place] TO, READ BACK BY AND VERIFIED WITH: DEVAN MADE 02/18/21 0851 KLW GRAM POSITIVE RODS AEROBIC BOTTLE ONLY CRITICAL RESULT CALLED TO, READ BACK BY AND VERIFIED WITH: BRANDON BEERS 02/21/2021 DLB    Culture (A)  Final    AEROCOCCUS VIRIDANS CORYNEBACTERIUM UREALYTICUM CORRECTED ON 08/22 AT 0854: PREVIOUSLY REPORTED AS GRAM POSITIVE RODS Standardized susceptibility testing for this organism is not available. Performed at [facility], [place] [address], [place],  [address]    Report Status 02/23/2021 FINAL  Final  Blood Culture ID Panel (Reflexed)     Status: None   Collection Time: 02/17/21  3:19 PM  Result Value Ref Range Status   Enterococcus faecalis NOT DETECTED NOT DETECTED Final   Enterococcus Faecium NOT DETECTED NOT DETECTED Final   Listeria monocytogenes NOT DETECTED NOT DETECTED Final   Staphylococcus species NOT DETECTED NOT DETECTED Final   Staphylococcus aureus (BCID) NOT DETECTED NOT DETECTED Final   Staphylococcus epidermidis NOT DETECTED NOT DETECTED Final   Staphylococcus lugdunensis NOT DETECTED NOT DETECTED Final   Streptococcus species NOT  DETECTED NOT DETECTED Final   Streptococcus agalactiae NOT DETECTED NOT DETECTED Final   Streptococcus pneumoniae NOT DETECTED NOT DETECTED Final   Streptococcus pyogenes NOT DETECTED NOT DETECTED Final   A.calcoaceticus-baumannii NOT DETECTED NOT DETECTED Final   Bacteroides fragilis NOT DETECTED NOT DETECTED Final   Enterobacterales NOT DETECTED NOT DETECTED Final   Enterobacter cloacae complex NOT  DETECTED NOT DETECTED Final   Escherichia coli NOT DETECTED NOT DETECTED Final   Klebsiella aerogenes NOT DETECTED NOT DETECTED Final   Klebsiella oxytoca NOT DETECTED NOT DETECTED Final   Klebsiella pneumoniae NOT DETECTED NOT DETECTED Final   Proteus species NOT DETECTED NOT DETECTED Final   Salmonella species NOT DETECTED NOT DETECTED Final   Serratia marcescens NOT DETECTED NOT DETECTED Final   Haemophilus influenzae NOT DETECTED NOT DETECTED Final   Neisseria meningitidis NOT DETECTED NOT DETECTED Final   Pseudomonas aeruginosa NOT DETECTED NOT DETECTED Final   Stenotrophomonas maltophilia NOT DETECTED NOT DETECTED Final   Candida albicans NOT DETECTED NOT DETECTED Final   Candida auris NOT DETECTED NOT DETECTED Final   Candida glabrata NOT DETECTED NOT DETECTED Final   Candida krusei NOT DETECTED NOT DETECTED Final   Candida parapsilosis NOT DETECTED NOT DETECTED Final   Candida tropicalis NOT DETECTED NOT DETECTED Final   Cryptococcus neoformans/gattii NOT DETECTED NOT DETECTED Final    Comment: Performed at [facility], [address] [place], [place] [address]  Blood culture (routine x 2)     Status: None   Collection Time: 02/17/21  3:25 PM   Specimen: BLOOD  Result Value Ref Range Status   Specimen Description BLOOD BLOOD LEFT HAND  Final   Special Requests   Final    BOTTLES DRAWN AEROBIC ONLY Blood Culture results may not be optimal due to an inadequate volume of blood received in culture bottles   Culture   Final    NO GROWTH 5 DAYS Performed at [facility], [address] [place], [place] [address]    Report Status 02/22/2021 FINAL  Final  CULTURE, BLOOD (ROUTINE X 2) w Reflex to ID Panel     Status: None (Preliminary result)   Collection Time: 02/22/21  9:03 AM   Specimen: BLOOD  Result Value Ref Range Status   Specimen Description BLOOD BLOOD RIGHT ARM  Final   Special Requests   Final    BOTTLES DRAWN AEROBIC AND ANAEROBIC Blood Culture adequate volume   Culture   Final    NO GROWTH 2 DAYS Performed at [facility], [address] [place], [place] [address]    Report Status PENDING  Incomplete  CULTURE, BLOOD (ROUTINE X 2) w Reflex to ID Panel     Status: None (Preliminary result)   Collection Time: 02/22/21  9:08 AM   Specimen: BLOOD  Result Value Ref Range Status   Specimen Description BLOOD RIGHT ANTECUBITAL  Final   Special Requests   Final    BOTTLES DRAWN AEROBIC AND ANAEROBIC Blood Culture adequate volume   Culture   Final    NO GROWTH 2 DAYS Performed at [facility], [address] [place], [place] [address]    Report Status PENDING  Incomplete  MRSA Next Gen by PCR, Nasal     Status: Abnormal   Collection Time: 02/23/21 11:05 AM   Specimen: Nasal Mucosa; Nasal Swab  Result Value Ref Range Status   MRSA by PCR Next Gen DETECTED (A) NOT DETECTED Final    Comment: READ BACK AND VERIFIED BY Laureen Ochs, RN [phone] 02/23/21 BY JRH (NOTE) The GeneXpert MRSA Assay (FDA approved for NASAL specimens only), is one component of a comprehensive MRSA colonization surveillance program. It is not intended to diagnose MRSA infection nor to guide or monitor treatment for MRSA infections. Test performance is not FDA approved in patients less than 50 years old. Performed at  [facility], [address] [place], [place] [address]      Labs: BNP (last 3 results) No results for input(s): BNP in the last 8760 hours. Basic Metabolic Panel: Recent Labs  Lab 02/17/21 1343 02/18/21 0403  02/20/21 0340 02/22/21 0858 02/23/21 0558  NA 134* 138  --  136  --   K 5.5* 3.9  --  3.9  --   CL 104 110  --  103  --   CO2 19* 21*  --  22  --   GLUCOSE 152* 62*  --  105*  --   BUN 61* 53*  --  37*  --   CREATININE 2.22* 1.88* 1.50* 1.61* 1.54*  CALCIUM 9.1 8.6*  --  9.0  --    Liver Function Tests: Recent Labs  Lab 02/17/21 1343 02/18/21 0403  AST 48* 30  ALT 31 27  ALKPHOS 210* 200*  BILITOT 1.6* 0.8  PROT 6.6 5.9*  ALBUMIN 2.1* 2.0*   No results for input(s): LIPASE, AMYLASE in the last 168 hours. No results for input(s): AMMONIA in the last 168 hours. CBC: Recent Labs  Lab 02/17/21 1343 02/18/21 0403 02/19/21 0501 02/20/21 0340 02/22/21 0452  WBC 17.9* 19.1* 17.6* 17.8* 16.4*  HGB 7.4* 6.4* 8.3* 8.9* 8.9*  HCT 24.2* 21.0* 27.0* 28.6* 28.3*  MCV 84.0 84.0 84.9 80.8 82.5  PLT 384 355 314 354 354   Cardiac Enzymes: No results for input(s): CKTOTAL, CKMB, CKMBINDEX, TROPONINI in the last 168 hours. BNP: Invalid input(s): POCBNP CBG: Recent Labs  Lab 02/23/21 0828 02/23/21 1230 02/23/21 1704 02/23/21 2130 02/24/21 0831  GLUCAP 82 176* 168* 160* 131*   D-Dimer No results for input(s): DDIMER in the last 72 hours. Hgb A1c No results for input(s): HGBA1C in the last 72 hours. Lipid Profile No results for input(s): CHOL, HDL, LDLCALC, TRIG, CHOLHDL, LDLDIRECT in the last 72 hours. Thyroid function studies No results for input(s): TSH, T4TOTAL, T3FREE, THYROIDAB in the last 72 hours.  Invalid input(s): FREET3 Anemia work up No results for input(s): VITAMINB12, FOLATE, FERRITIN, TIBC, IRON, RETICCTPCT in the last 72 hours. Urinalysis    Component Value Date/Time   COLORURINE YELLOW (A) 02/17/2021 1516   APPEARANCEUR CLOUDY (A) 02/17/2021 1516   LABSPEC 1.014 02/17/2021 1516   PHURINE 5.0 02/17/2021 1516   GLUCOSEU NEGATIVE 02/17/2021 1516   HGBUR MODERATE (A) 02/17/2021 1516   BILIRUBINUR NEGATIVE 02/17/2021 1516   KETONESUR NEGATIVE  02/17/2021 1516   PROTEINUR NEGATIVE 02/17/2021 1516   NITRITE NEGATIVE 02/17/2021 1516   LEUKOCYTESUR NEGATIVE 02/17/2021 1516   Sepsis Labs Invalid input(s): PROCALCITONIN,  WBC,  LACTICIDVEN Microbiology Recent Results (from the past 240 hour(s))  Resp Panel by RT-PCR (Flu A&B, Covid) Nasopharyngeal Swab     Status: None   Collection Time: 02/17/21  2:11 PM   Specimen: Nasopharyngeal Swab; Nasopharyngeal(NP) swabs in vial transport medium  Result Value Ref Range Status   SARS Coronavirus 2 by RT PCR NEGATIVE NEGATIVE Final    Comment: (NOTE) SARS-CoV-2 target nucleic acids are NOT DETECTED.  The SARS-CoV-2 RNA is generally detectable in upper respiratory specimens during the acute phase of infection. The lowest concentration of SARS-CoV-2 viral copies this assay can detect is 138 copies/mL. A negative result does not preclude SARS-Cov-2 infection and should not be used as the sole basis for treatment or other patient management decisions. A negative result may occur with  improper specimen collection/handling, submission of specimen other than nasopharyngeal swab, presence of viral  mutation(s) within the areas targeted by this assay, and inadequate number of viral copies(<138 copies/mL). A negative result must be combined with clinical observations, patient history, and epidemiological information. The expected result is Negative.  Fact Sheet for Patients:  EntrepreneurPulse.com.au  Fact Sheet for Healthcare Providers:  IncredibleEmployment.be  This test is no t yet approved or cleared by the [place] FDA and  has been authorized for detection and/or diagnosis of SARS-CoV-2 by FDA under an Emergency Use Authorization (EUA). This EUA will remain  in effect (meaning this test can be used) for the duration of the COVID-19 declaration under Section 564(b)(1) of the Act, 21 U.S.C.section 360bbb-3(b)(1), unless the authorization is  terminated  or revoked sooner.       Influenza A by PCR NEGATIVE NEGATIVE Final   Influenza B by PCR NEGATIVE NEGATIVE Final    Comment: (NOTE) The Xpert Xpress SARS-CoV-2/FLU/RSV plus assay is intended as an aid in the diagnosis of influenza from Nasopharyngeal swab specimens and should not be used as a sole basis for treatment. Nasal washings and aspirates are unacceptable for Xpert Xpress SARS-CoV-2/FLU/RSV testing.  Fact Sheet for Patients: EntrepreneurPulse.com.au  Fact Sheet for Healthcare Providers: IncredibleEmployment.be  This test is not yet approved or cleared by the [place] FDA and has been authorized for detection and/or diagnosis of SARS-CoV-2 by FDA under an Emergency Use Authorization (EUA). This EUA will remain in effect (meaning this test can be used) for the duration of the COVID-19 declaration under Section 564(b)(1) of the Act, 21 U.S.C. section 360bbb-3(b)(1), unless the authorization is terminated or revoked.  Performed at [facility], [address] [place], [place] [address]   Blood culture (routine x 2)     Status: Abnormal   Collection Time: 02/17/21  3:19 PM   Specimen: BLOOD  Result Value Ref Range Status   Specimen Description   Final    BLOOD RIGHT ANTECUBITAL Performed at [facility], [address] [place], [place] [address]    Special Requests   Final    BOTTLES DRAWN AEROBIC AND ANAEROBIC Blood Culture results may not be optimal due to an inadequate volume of blood received in culture bottles Performed at [facility], [address] [place], [place] [address]    Culture  Setup Time   Final    Organism ID to follow [place] TO, READ BACK BY AND VERIFIED WITH: DEVAN MADE 02/18/21 0851 KLW GRAM POSITIVE RODS AEROBIC BOTTLE ONLY CRITICAL RESULT CALLED TO, READ BACK BY AND VERIFIED WITH: BRANDON BEERS  02/21/2021 DLB    Culture (A)  Final    AEROCOCCUS VIRIDANS CORYNEBACTERIUM UREALYTICUM CORRECTED ON 08/22 AT 0854: PREVIOUSLY REPORTED AS GRAM POSITIVE RODS Standardized susceptibility testing for this organism is not available. Performed at [facility], [address], [place], [place] [address]    Report Status 02/23/2021 FINAL  Final  Blood Culture ID Panel (Reflexed)     Status: None   Collection Time: 02/17/21  3:19 PM  Result Value Ref Range Status   Enterococcus faecalis NOT DETECTED NOT DETECTED Final   Enterococcus Faecium NOT DETECTED NOT DETECTED Final   Listeria monocytogenes NOT DETECTED NOT DETECTED Final   Staphylococcus species NOT DETECTED NOT DETECTED Final   Staphylococcus aureus (BCID) NOT DETECTED NOT DETECTED Final   Staphylococcus epidermidis NOT DETECTED NOT DETECTED Final   Staphylococcus lugdunensis NOT DETECTED NOT DETECTED Final   Streptococcus species NOT DETECTED NOT DETECTED  Final   Streptococcus agalactiae NOT DETECTED NOT DETECTED Final   Streptococcus pneumoniae NOT DETECTED NOT DETECTED Final   Streptococcus pyogenes NOT DETECTED NOT DETECTED Final   A.calcoaceticus-baumannii NOT DETECTED NOT DETECTED Final   Bacteroides fragilis NOT DETECTED NOT DETECTED Final   Enterobacterales NOT DETECTED NOT DETECTED Final   Enterobacter cloacae complex NOT DETECTED NOT DETECTED Final   Escherichia coli NOT DETECTED NOT DETECTED Final   Klebsiella aerogenes NOT DETECTED NOT DETECTED Final   Klebsiella oxytoca NOT DETECTED NOT DETECTED Final   Klebsiella pneumoniae NOT DETECTED NOT DETECTED Final   Proteus species NOT DETECTED NOT DETECTED Final   Salmonella species NOT DETECTED NOT DETECTED Final   Serratia marcescens NOT DETECTED NOT DETECTED Final   Haemophilus influenzae NOT DETECTED NOT DETECTED Final   Neisseria meningitidis NOT DETECTED NOT DETECTED Final   Pseudomonas aeruginosa NOT DETECTED NOT DETECTED Final   Stenotrophomonas maltophilia  NOT DETECTED NOT DETECTED Final   Candida albicans NOT DETECTED NOT DETECTED Final   Candida auris NOT DETECTED NOT DETECTED Final   Candida glabrata NOT DETECTED NOT DETECTED Final   Candida krusei NOT DETECTED NOT DETECTED Final   Candida parapsilosis NOT DETECTED NOT DETECTED Final   Candida tropicalis NOT DETECTED NOT DETECTED Final   Cryptococcus neoformans/gattii NOT DETECTED NOT DETECTED Final    Comment: Performed at [facility], [address] [place], [place] [address]  Blood culture (routine x 2)     Status: None   Collection Time: 02/17/21  3:25 PM   Specimen: BLOOD  Result Value Ref Range Status   Specimen Description BLOOD BLOOD LEFT HAND  Final   Special Requests   Final    BOTTLES DRAWN AEROBIC ONLY Blood Culture results may not be optimal due to an inadequate volume of blood received in culture bottles   Culture   Final    NO GROWTH 5 DAYS Performed at [facility], [address] [place], [place] [address]    Report Status 02/22/2021 FINAL  Final  CULTURE, BLOOD (ROUTINE X 2) w Reflex to ID Panel     Status: None (Preliminary result)   Collection Time: 02/22/21  9:03 AM   Specimen: BLOOD  Result Value Ref Range Status   Specimen Description BLOOD BLOOD RIGHT ARM  Final   Special Requests   Final    BOTTLES DRAWN AEROBIC AND ANAEROBIC Blood Culture adequate volume   Culture   Final    NO GROWTH 2 DAYS Performed at [facility], [address] [place], [place] [address]    Report Status PENDING  Incomplete  CULTURE, BLOOD (ROUTINE X 2) w Reflex to ID Panel     Status: None (Preliminary result)   Collection Time: 02/22/21  9:08 AM   Specimen: BLOOD  Result Value Ref Range Status   Specimen Description BLOOD RIGHT ANTECUBITAL  Final   Special Requests   Final    BOTTLES DRAWN AEROBIC AND ANAEROBIC Blood Culture adequate volume   Culture   Final    NO GROWTH 2 DAYS Performed at [facility], [address]  [place],  [address]    Report Status PENDING  Incomplete  MRSA Next Gen by PCR, Nasal     Status: Abnormal   Collection Time: 02/23/21 11:05 AM   Specimen: Nasal Mucosa; Nasal Swab  Result Value Ref Range Status   MRSA by PCR Next Gen DETECTED (A) NOT DETECTED Final    Comment: READ BACK AND VERIFIED BY  Laureen Ochs, RN [phone] 02/23/21 BY JRH (NOTE) The GeneXpert MRSA Assay (FDA approved for NASAL specimens only), is one component of a comprehensive MRSA colonization surveillance program. It is not intended to diagnose MRSA infection nor to guide or monitor treatment for MRSA infections. Test performance is not FDA approved in patients less than 3 years old. Performed at [facility], [address] [place], [place] [address]      Time coordinating discharge: Over 30 minutes  SIGNED:   Nolberto Hanlon, MD  [facility] 02/24/2021, 11:01 AM Pager   If 7PM-7AM, please contact night-coverage [email or web address] Password [email or web address]

## 2021-02-24 NOTE — TOC Progression Note (Signed)
Transition of Care [facility]) - Progression Note    Patient Details  Name: Robert Little MRN: [id number] Date of Birth: 04-01-1941  Transition of Care [facility]) CM/SW [place], RN Phone Number: 02/24/2021, 1:59 PM  Clinical Narrative:    Notified the patient's legal guardian that the patient will return to [facility] room 57A today via EMS and will be followed by Hospice while there, He stated understanding, the Nurse is to call report to the facility        Expected Discharge Plan and Services           Expected Discharge Date: 02/24/21                                     Social Determinants of Health (SDOH) Interventions    Readmission Risk Interventions Readmission Risk Prevention Plan 11/23/2019 09/20/2019  Transportation Screening Complete Complete  PCP or Specialist Appt within 3-5 Days - Complete  HRI or [place] - Complete  Social Work Consult for [place] Planning/Counseling - Complete  Palliative Care Screening - Not Applicable  Medication Review Press photographer) Complete Complete  PCP or Specialist appointment within 3-5 days of discharge Complete -  [place] or Home Care Consult Complete -  SW Recovery Care/Counseling Consult Complete -  Palliative Care Screening Not Applicable -  Skilled Nursing Facility Complete -  Some recent data might be hidden

## 2021-02-24 NOTE — Progress Notes (Signed)
Report called to [facility] at [facility] with opportunity to ask questions. Awaiting on transport.

## 2021-02-24 NOTE — Progress Notes (Signed)
EMS picked up patient to go to [facility]. Updated vitals. IV kept in per [facility] at [facility]. All belonging sent with patient. Discharge packet given to EMS.

## 2021-02-27 LAB — CULTURE, BLOOD (ROUTINE X 2)
Culture: NO GROWTH
Culture: NO GROWTH
Special Requests: ADEQUATE
Special Requests: ADEQUATE

## 2021-04-04 DEATH — deceased

## 2021-04-28 IMAGING — DX DG CHEST 2V
2 series · 2 of 2 positions shown · non-contrast
Comparison: Single-view of the chest 09/17/2019. PA and lateral
chest 05/15/2018.

CLINICAL DATA: Shortness of breath for several weeks.

EXAM:
CHEST - 2 VIEW

[chest lat]
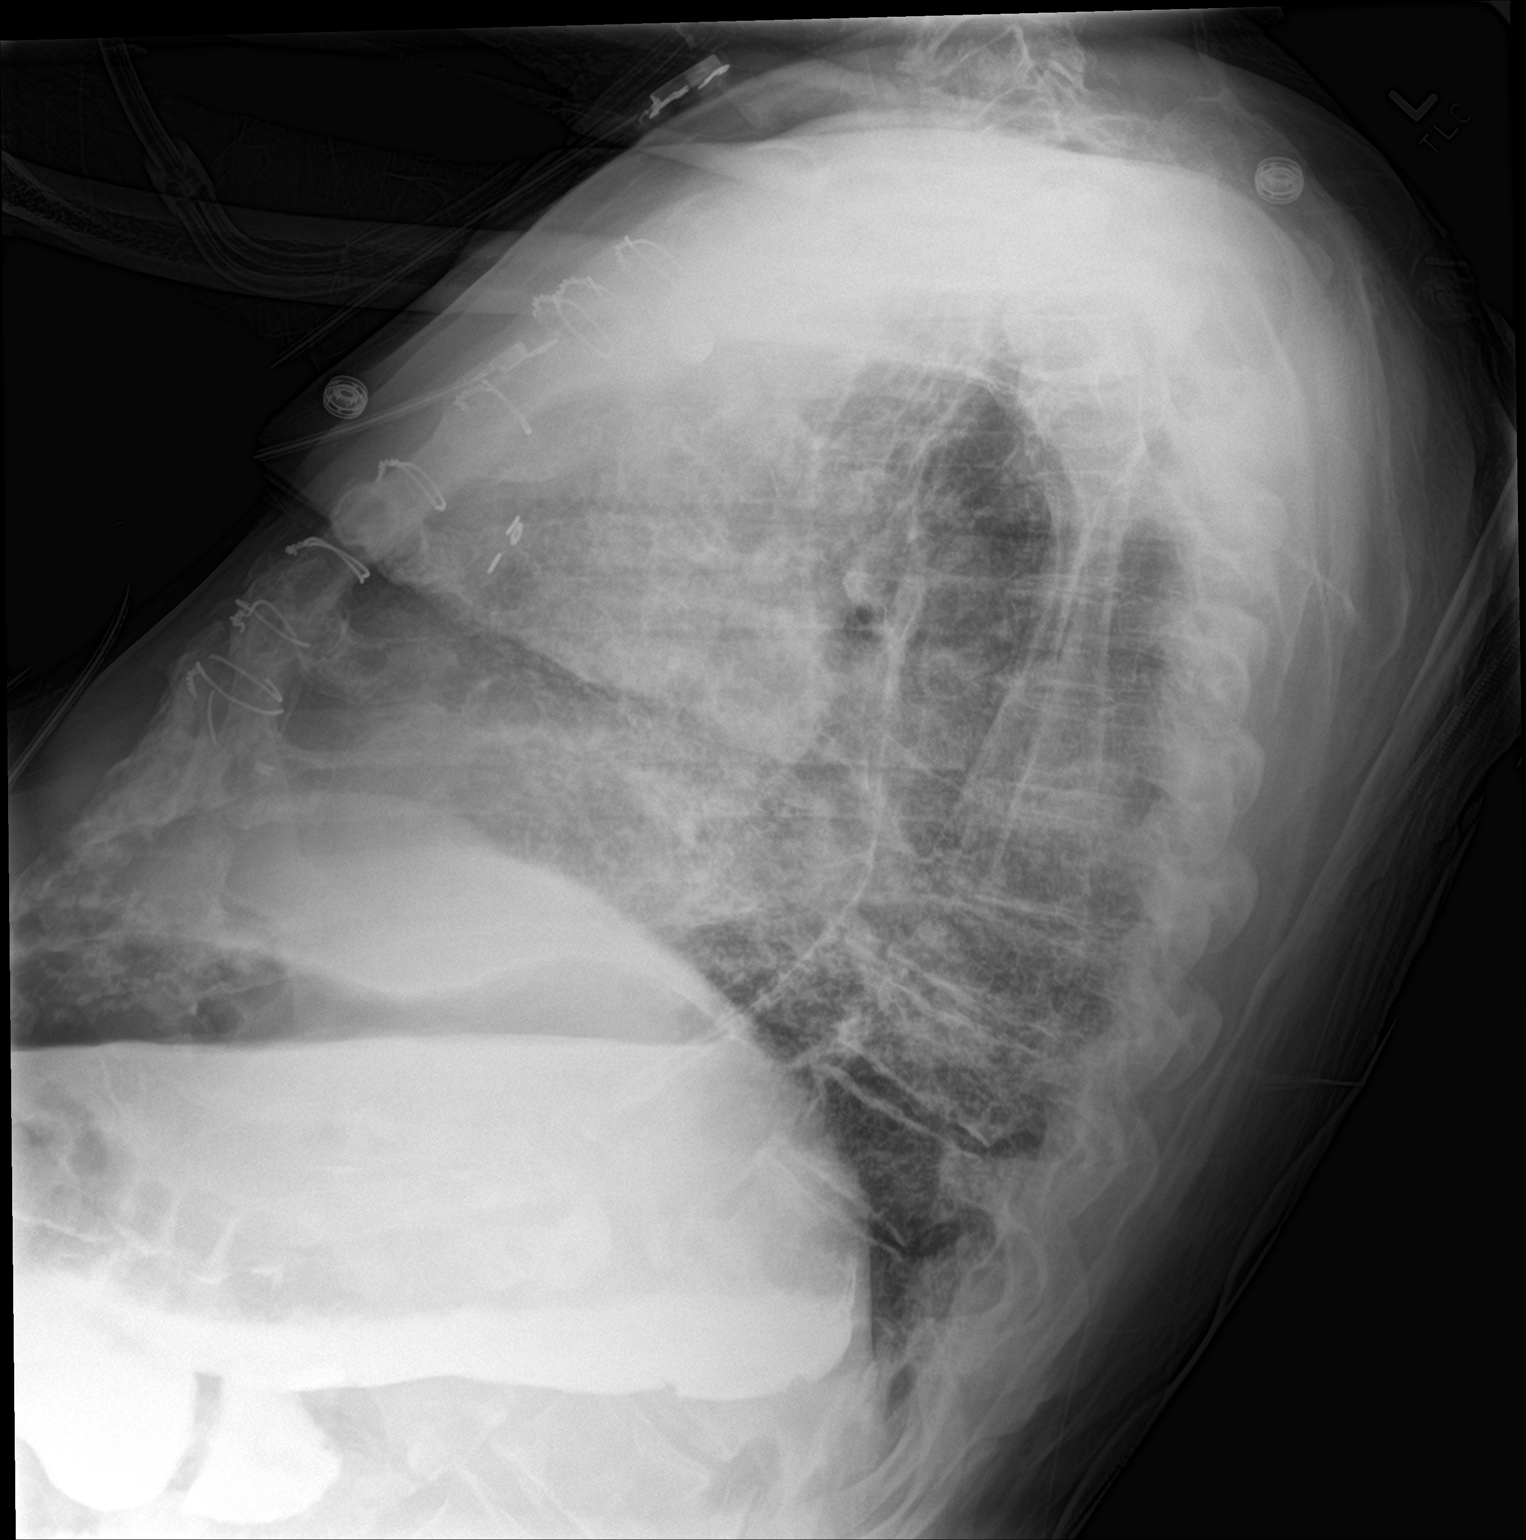

[chest ap]
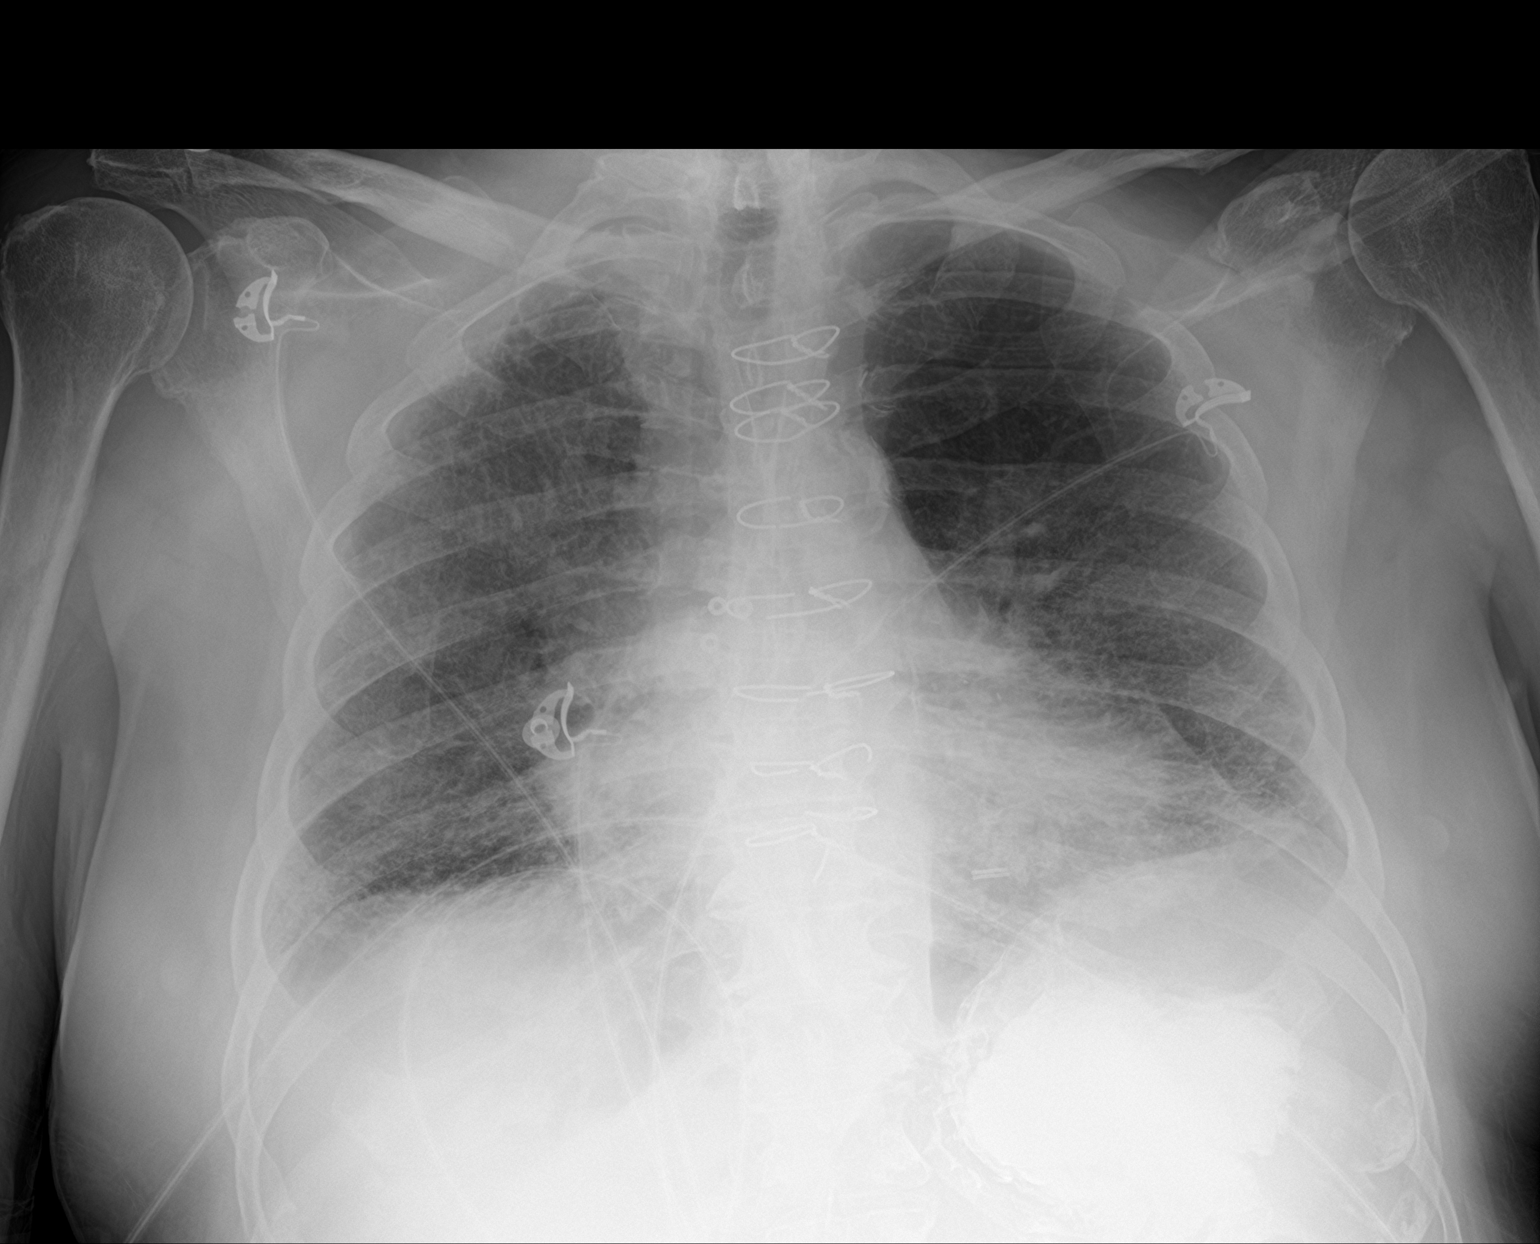

[2 of 2 positions shown; findings below may reference images not displayed]

FINDINGS: The patient is status post CABG. Heart size is upper normal. The
lungs appear emphysematous with fibrosis. No consolidative process,
pneumothorax or effusion. Atherosclerosis noted.
IMPRESSION: No acute disease.

The lungs appear emphysematous with fibrosis.

Atherosclerosis.

## 2021-09-03 IMAGING — NM NM PULMONARY PERF PARTICULATE
8 series · 8 of 8 positions shown · non-contrast
Comparison: Radiograph 01/25/2020, CT imaging 09/19/2019.

CLINICAL DATA: Suspected pulmonary embolism, positive D-dimer,
pulmonary fibrosis and COVID pneumonia 2 months prior now with
shortness of breath

EXAM:
NUCLEAR MEDICINE PERFUSION LUNG SCAN
TECHNIQUE: Perfusion images were obtained in multiple projections after
intravenous injection of radiopharmaceutical.
Ventilation scans intentionally deferred if perfusion scan and chest
x-ray adequate for interpretation during COVID 19 epidemic.
RADIOPHARMACEUTICALS:  3.7 mCi 8c-KKm MAA IV

[Series 1: ant/post perf · 4.14mm/px · 1 of 1 slices shown (1 of 2)]
[im 1/1]
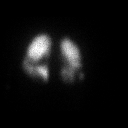

[Series 1: ant/post perf · 4.14mm/px · 1 of 1 slices shown (2 of 2)]
[im 1/1]
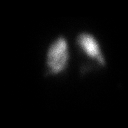

[Series 2: lao/rpo perf · 4.14mm/px · 1 of 1 slices shown (1 of 2)]
[im 1/1]
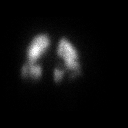

[Series 2: lao/rpo perf · 4.14mm/px · 1 of 1 slices shown (2 of 2)]
[im 1/1]
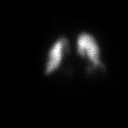

[Series 3: lpo/rao perf · 4.14mm/px · 1 of 1 slices shown (1 of 2)]
[im 1/1]
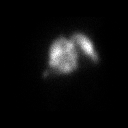

[Series 3: lpo/rao perf · 4.14mm/px · 1 of 1 slices shown (2 of 2)]
[im 1/1]
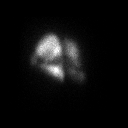

[Series 4: lt lat/rt lat perf · 4.14mm/px · 1 of 1 slices shown (1 of 2)]
[im 1/1]
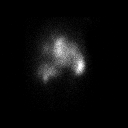

[Series 4: lt lat/rt lat perf · 4.14mm/px · 1 of 1 slices shown (2 of 2)]
[im 1/1]
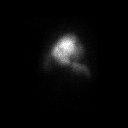

[8 of 8 positions shown; findings below may reference images not displayed]

FINDINGS: Nonsegmental perfusion defects in the left and right lung appear to
correspond to the areas of more pronounced fibrotic interstitial
change seen on the comparison CT which remains present on today's
radiographic examination corresponding with a mismatch defect on
perfusion only imaging. No other discernible perfusion defects.
IMPRESSION: Per modified PIOPED II perfusion only criteria, is the presence of
the nonsegmental perfusion defects with CT/radiographic correlate
should be interpreted as a normal exam to very low probability of
pulmonary embolism.

## 2022-09-27 IMAGING — US US ABDOMEN LIMITED
1 series · 14 of 25 positions shown · non-contrast
Comparison: None.

CLINICAL DATA: Elevated liver function test.

EXAM:
ULTRASOUND ABDOMEN LIMITED RIGHT UPPER QUADRANT

[Series 1: us abdomen limited ruq (liver/gb) · 47 acquisitions, 14 frames shown]
[im 1/47]
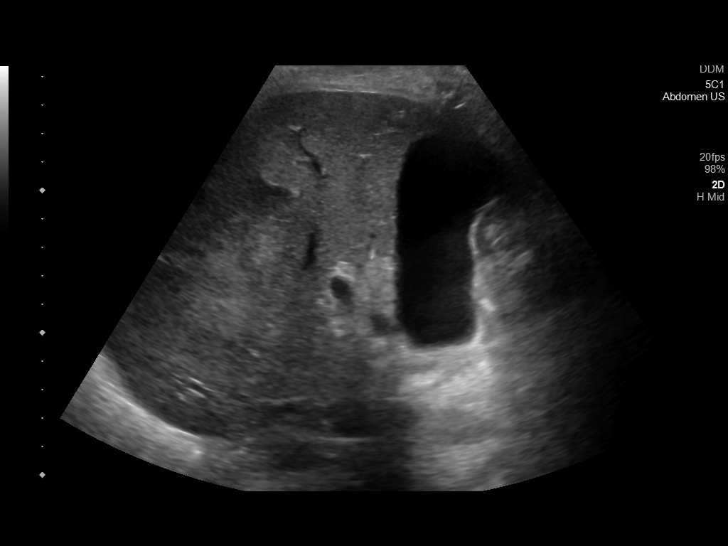
[im 4/47]
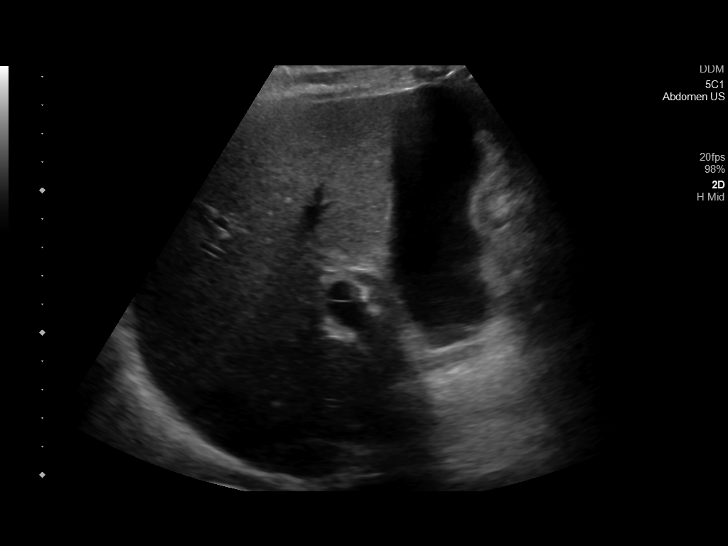
[im 8/47]
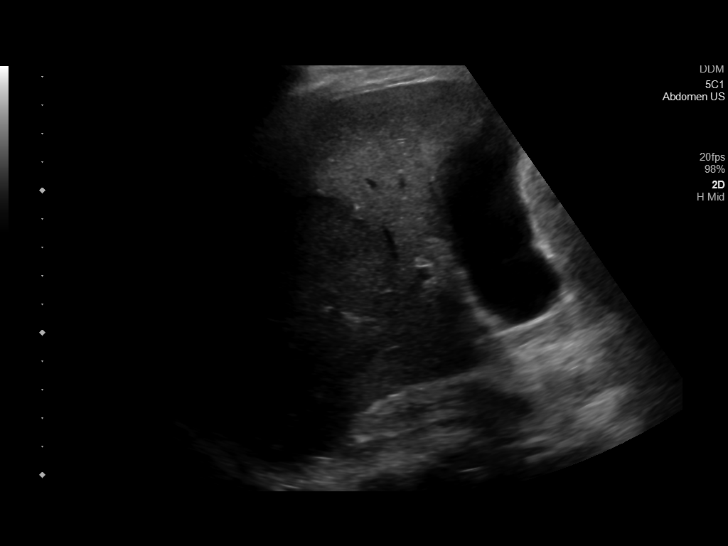
[im 12/47]
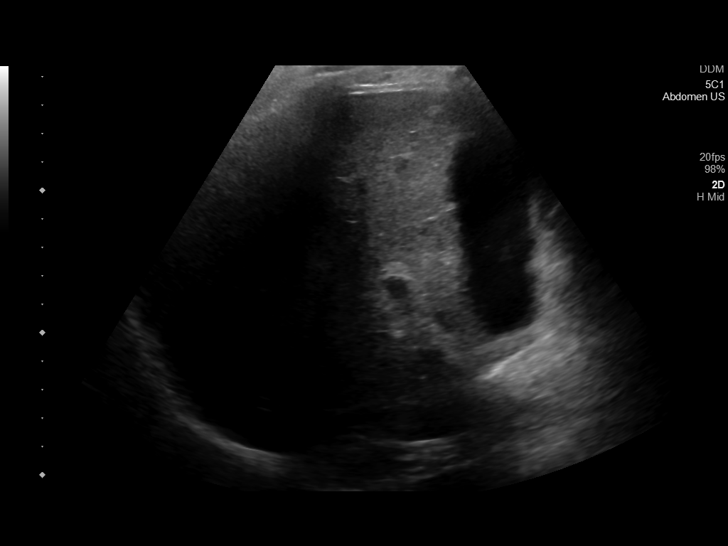
[im 16/47]
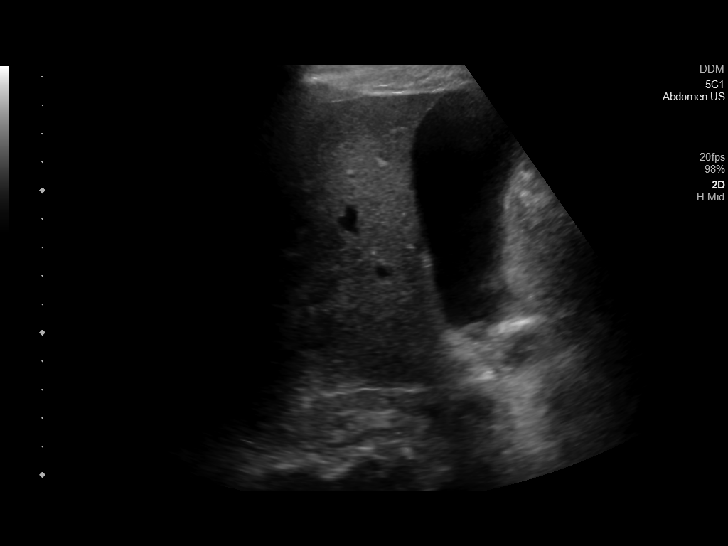
[im 18/47]
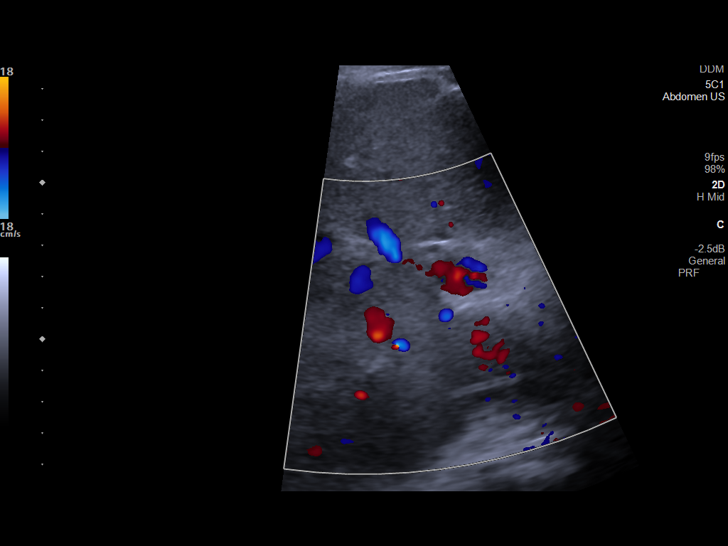
[im 22/47]
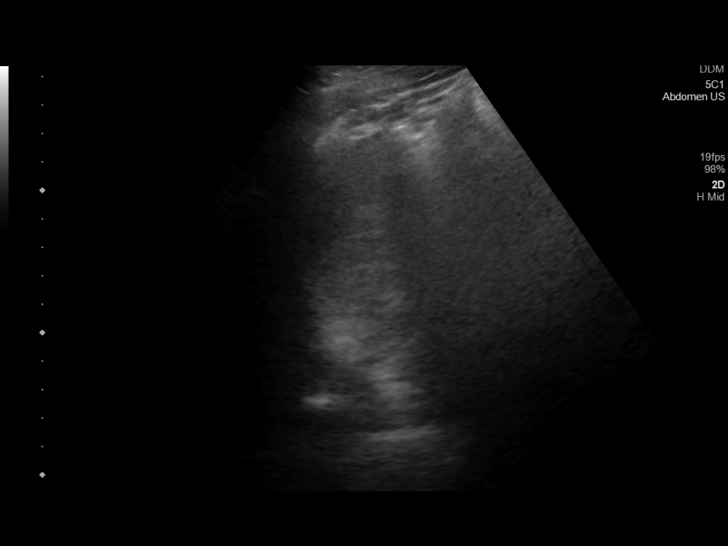
[im 25/47]
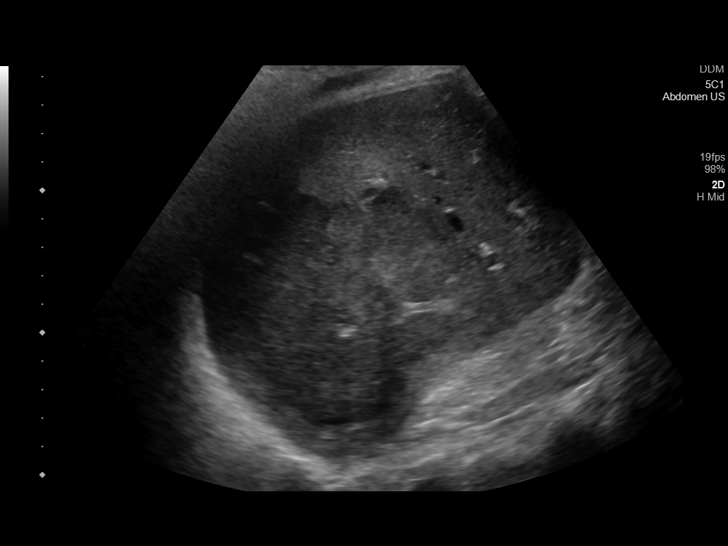
[im 29/47]
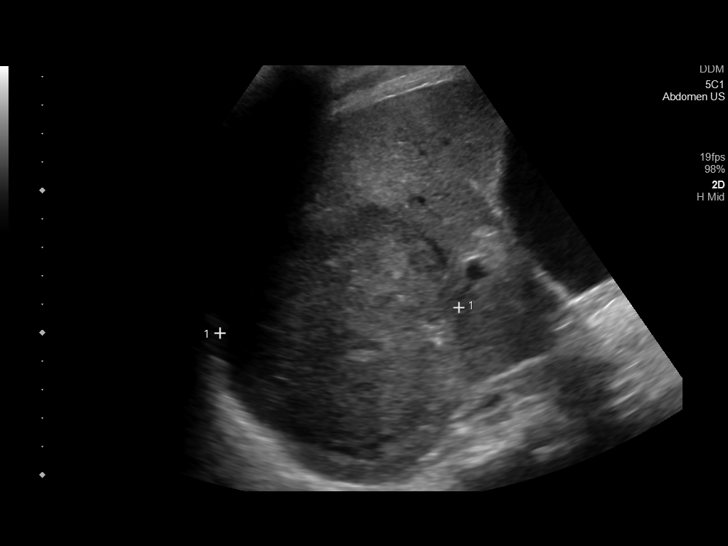
[im 31/47]
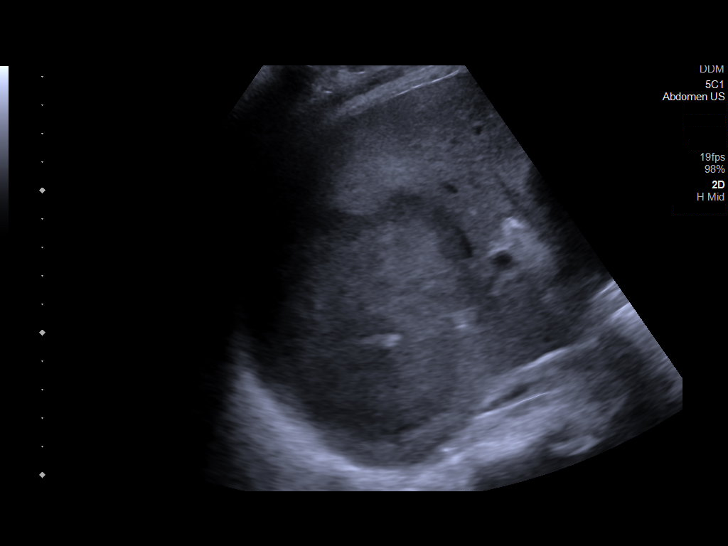
[im 35/47]
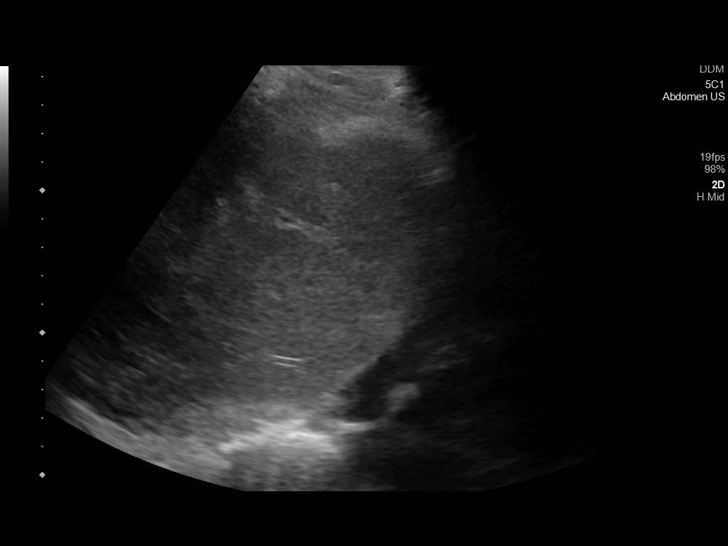
[im 39/47]
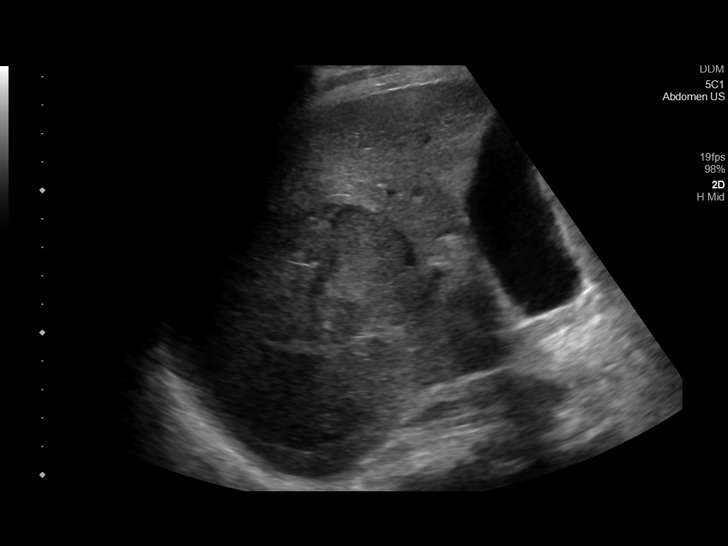
[im 43/47]
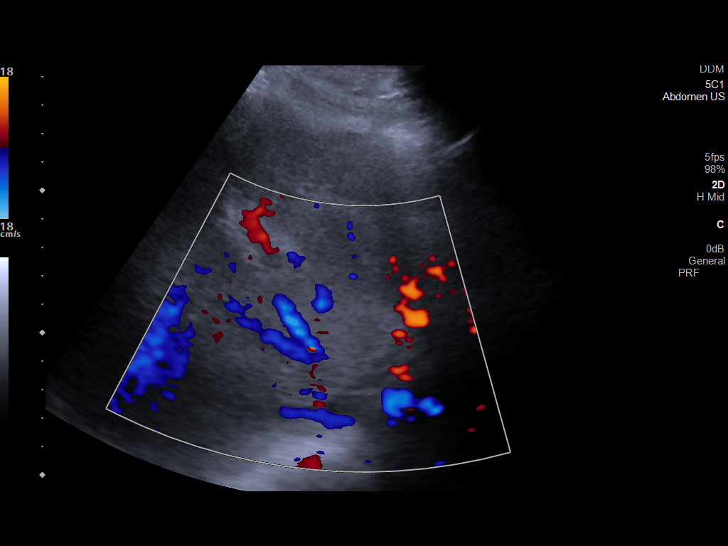
[im 47/47]
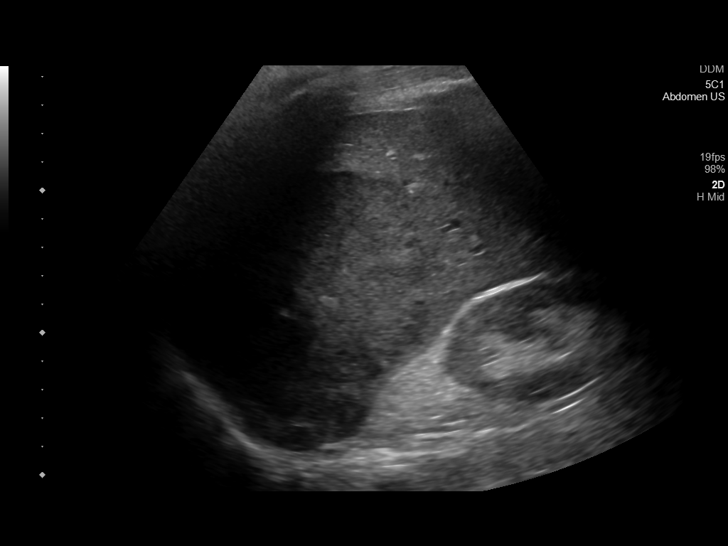

[14 of 25 positions shown; findings below may reference images not displayed]

FINDINGS: Gallbladder:

No gallstones or wall thickening visualized (2.0 mm). No sonographic
Murphy sign noted by sonographer.

Common bile duct:

Diameter: 2.3 mm

Liver:

An 8.8 cm x 8.4 cm x 8.4 cm heterogeneous liver mass is seen within
the right lobe of the liver. There is diffusely increased
echogenicity of the liver parenchyma. Portal vein is patent on color
Doppler imaging with normal direction of blood flow towards the
liver.

Other: None.
IMPRESSION: 1. Large heterogeneous liver mass, worrisome for the presence of an
underlying neoplasm. Further evaluation with hepatic MRI is
recommended.
